# Patient Record
Sex: Female | Born: 1975
Health system: Southern US, Community
[De-identification: ages and names within clinical notes are randomized; demographics above are authoritative.]

## PROBLEM LIST (undated history)

## (undated) DIAGNOSIS — Z5189 Encounter for other specified aftercare: Secondary | ICD-10-CM

## (undated) DIAGNOSIS — E039 Hypothyroidism, unspecified: Secondary | ICD-10-CM

## (undated) DIAGNOSIS — E669 Obesity, unspecified: Secondary | ICD-10-CM

## (undated) DIAGNOSIS — M1990 Unspecified osteoarthritis, unspecified site: Secondary | ICD-10-CM

## (undated) DIAGNOSIS — I639 Cerebral infarction, unspecified: Secondary | ICD-10-CM

## (undated) DIAGNOSIS — G709 Myoneural disorder, unspecified: Secondary | ICD-10-CM

## (undated) DIAGNOSIS — E559 Vitamin D deficiency, unspecified: Secondary | ICD-10-CM

## (undated) DIAGNOSIS — K219 Gastro-esophageal reflux disease without esophagitis: Secondary | ICD-10-CM

## (undated) DIAGNOSIS — T7840XA Allergy, unspecified, initial encounter: Secondary | ICD-10-CM

## (undated) DIAGNOSIS — F419 Anxiety disorder, unspecified: Secondary | ICD-10-CM

## (undated) DIAGNOSIS — D649 Anemia, unspecified: Secondary | ICD-10-CM

## (undated) HISTORY — DX: Hypothyroidism, unspecified: E03.9

## (undated) HISTORY — DX: Unspecified osteoarthritis, unspecified site: M19.90

## (undated) HISTORY — DX: Vitamin D deficiency, unspecified: E55.9

## (undated) HISTORY — DX: Anxiety disorder, unspecified: F41.9

## (undated) HISTORY — DX: Cerebral infarction, unspecified: I63.9

## (undated) HISTORY — PX: DILATION AND CURETTAGE OF UTERUS: SHX78

## (undated) HISTORY — DX: Anemia, unspecified: D64.9

## (undated) HISTORY — DX: Allergy, unspecified, initial encounter: T78.40XA

## (undated) HISTORY — DX: Encounter for other specified aftercare: Z51.89

## (undated) HISTORY — DX: Gastro-esophageal reflux disease without esophagitis: K21.9

## (undated) HISTORY — PX: COSMETIC SURGERY: SHX468

---
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
## 2000-12-27 ENCOUNTER — Inpatient Hospital Stay (HOSPITAL_COMMUNITY): Admission: EM | Admit: 2000-12-27 | Discharge: 2000-12-29 | Payer: Self-pay | Admitting: *Deleted

## 2003-01-17 ENCOUNTER — Emergency Department (HOSPITAL_COMMUNITY): Admission: EM | Admit: 2003-01-17 | Discharge: 2003-01-17 | Payer: Self-pay | Admitting: Emergency Medicine

## 2003-01-17 ENCOUNTER — Encounter: Payer: Self-pay | Admitting: Emergency Medicine

## 2006-09-04 ENCOUNTER — Encounter (INDEPENDENT_AMBULATORY_CARE_PROVIDER_SITE_OTHER): Payer: Self-pay | Admitting: Specialist

## 2006-09-04 ENCOUNTER — Ambulatory Visit (HOSPITAL_COMMUNITY): Admission: RE | Admit: 2006-09-04 | Discharge: 2006-09-04 | Payer: Self-pay | Admitting: Obstetrics and Gynecology

## 2007-05-18 ENCOUNTER — Emergency Department (HOSPITAL_COMMUNITY): Admission: EM | Admit: 2007-05-18 | Discharge: 2007-05-18 | Payer: Self-pay | Admitting: Emergency Medicine

## 2007-09-29 ENCOUNTER — Ambulatory Visit: Payer: Self-pay | Admitting: Orthopedic Surgery

## 2007-09-29 DIAGNOSIS — S93409A Sprain of unspecified ligament of unspecified ankle, initial encounter: Secondary | ICD-10-CM | POA: Insufficient documentation

## 2007-09-29 DIAGNOSIS — M79609 Pain in unspecified limb: Secondary | ICD-10-CM | POA: Insufficient documentation

## 2007-09-29 DIAGNOSIS — M25579 Pain in unspecified ankle and joints of unspecified foot: Secondary | ICD-10-CM | POA: Insufficient documentation

## 2007-10-06 ENCOUNTER — Ambulatory Visit: Payer: Self-pay | Admitting: Orthopedic Surgery

## 2007-10-20 ENCOUNTER — Ambulatory Visit: Payer: Self-pay | Admitting: Orthopedic Surgery

## 2007-10-20 ENCOUNTER — Encounter (INDEPENDENT_AMBULATORY_CARE_PROVIDER_SITE_OTHER): Payer: Self-pay | Admitting: *Deleted

## 2007-12-06 ENCOUNTER — Ambulatory Visit: Payer: Self-pay | Admitting: Orthopedic Surgery

## 2010-12-31 NOTE — Consult Note (Signed)
NAME:  Donna Mccarthy NO.:  [id number]   MEDICAL RECORD NO.:  [id number]          PATIENT TYPE:  EMS   LOCATION:  MAJO                         FACILITY:  [facility]   PHYSICIAN:  Marlan Palau, M.D.  DATE OF BIRTH:  1976/02/22   DATE OF CONSULTATION:  05/18/2007  DATE OF DISCHARGE:                                 CONSULTATION   HISTORY OF PRESENT ILLNESS:  Donna Mccarthy is a 35 year old right-  handed black female born 1976-02-27, with a history of depression  and suicide attempt in May 2002 with an overdose of Tylenol.  The  patient does have a history migraine headaches and claims to have events  of right leg numbness with her headaches.  The patient had similar onset  on the evening prior to this evaluation with right leg tingling.  The  patient was sent from [facility] to the [facility] emergency room for  evaluation of onset of right leg weakness and speech alteration, drawing  of the face.  The patient came into the a emergency room as a code  stroke.  Onset of deficits were around 12:45 p.m.  Clinical examination  suggests a psychogenic event.  The patient was set up for a CT scan of  the brain which by my review appears to be unremarkable.   PAST MEDICAL HISTORY:  Significant for:  1. New onset of right leg weakness and speech disturbance, likely      psychogenic in nature.  2. History of suicide attempt in 2002.  3. Obesity.  4. Migraine headaches.  5. D&C in the past.  6. History of anemia.   The patient is on no medications, has no known allergies with exception  that she has had a reaction to IV IRON DEXTRAN.   The patient does not smoke or drink.   SOCIAL HISTORY:  The patient is single, lives in the [place], [place],  area.  The patient has one son who is alive and well.  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   FAMILY MEDICAL HISTORY:  Is notable that mother is alive.  Father is  alive, has history of  hypertension.  The patient's brother is alive and  well.  There is a positive family history for diabetes, cancer, heart  disease and thyroid disease.   REVIEW OF SYSTEMS:  Notable for no recent fevers, chills.  The patient  does have history of headaches about once a month.  The patient did have  a headache today, although it was mild. The patient denies any vision  changes, swallowing problems. Noted to have photophobia with the  headaches.  Denies shortness of breath, chest pain, palpitations. Does  note some heart racing at times, however.  Does have some nausea.  No  abdominal pains, no trouble controlling bowels or bladder.  No blackout  episodes or seizures in the past.   PHYSICAL EXAMINATION:  VITAL SIGNS:  Blood pressure is 133/66, heart  rate 70, respiratory rate 22, temperature afebrile.  GENERAL:  The patient is a moderately obese black female who  is alert  and cooperative at the time of examination.  HEENT:  Head is atraumatic.  Eyes:  Pupils equal, round, reactive to  light.  Disks are flat bilaterally.  NECK:  Supple.  No carotid bruits noted.  RESPIRATORY:  Examination is clear.  CARDIOVASCULAR:  Examination reveals a regular rate and rhythm.  No  obvious murmurs, rubs noted.  EXTREMITIES:  Are without significant edema.  ABDOMEN:  Reveals positive bowel sounds.  No organomegaly or tenderness  noted.  NEUROLOGIC:  Cranial nerves as above.  The patient tended to pull the  left mouth down when trying to smile but otherwise has good symmetry  with eye closure and bringing eyebrows up.  The patient has good  pinprick sensation on face bilaterally.  The patient has a stuttering  type speech pattern.  No true aphasia noted.  No true dysarthria is  noted.  The patient has again full extraocular movements and full visual  fields.  Motor testing reveals full strength in all fours with exception  of poor motor effort with the right leg.  The patient has an  inconsistent  examination. Cannot initially keep the leg up off the bed  against gravity but then can perform heel-to-shin to some degree with  the right leg, clearly keeping it up against gravity.  The patient has  good abduction and adduction in a symmetric fashion in both legs.  The  patient has good dorsiflexion of the feet on both sides.  The patient  has good pinprick sensation throughout.  Vibratory sensation is  depressed on the right arm and right leg as compared to the left, and  again pinprick sensation is depressed on the right leg only.  The  patient has no drift with the upper extremities.  No drift with the left  lower extremity.  The patient was not ambulated.  Deep tendon reflexes  symmetric, normal,  and toes are downgoing bilaterally.   LABORATORY VALUES:  Pending at this time.   IMPRESSION:  1. 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  2. History of migraine headache.   This patient has no objective deficits on clinical examination. Variable  clinical examination most consistent with a psychogenic deficit.  Patient was not a t-PA candidate because of this.  Will perform an MRI  scan of the brain in the emergency room.  If  this unremarkable, will  discharge the patient home.      Marlan Palau, M.D.  Electronically Signed     CKW/MEDQ  D:  05/18/2007  T:  05/18/2007  Job:  [id number]   cc:   [facility]
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
## 2011-01-03 NOTE — Discharge Summary (Signed)
[facility]  Patient:    Donna Mccarthy, Donna Mccarthy                  MRN: [id number] Adm. Date:  [id number] Disc. Date: [id number] Attending:  Denny Peon                           Discharge Summary  IDENTIFYING INFORMATION:  Donna Mccarthy is a 35 year old African-American female admitted for serious overdose on Tylenol in suicidal attempt.  HISTORY OF PRESENT ILLNESS:  The patient was initially observed at [facility] and sent to [place] for treatment of depression.  Details are available on the patients initial evaluation.  HOSPITAL COURSE:  After admitting into the ward, the patient was placed on special observation.  She did not have any ongoing medical problems; however, by reviewing the records, I found that at [facility] she had Tylenol ingestion on January 19.  After calling poison control, I learned that this case was not reported to poison control and since ingestion took place at 5 p.m. on a Saturday, it is likely that four hours peak level was about 150 mcg/ml which should result in treatment with Mucomyst as per recommendation of poison control, she was started on loading dose of Mucomyst in dose of 140 mcg/kg which represented 12.5 g p.o. initially on Dec 28, 2000 at 9 in the morning and later 6.5 g daily q.4h. (six doses).  The liver function tests were checked and both liver function tests and INR were normal.  The patients vital signs during hospitalization were stable and no further treatment for overdose was needed.  During hospital stay, the patient has improved.  She was glad that she survived, and she wanted to enter therapy, but refused to take medication.  I talked on the phone with her father who supported the idea of discharging her home, feeling that he will take good care of her in the home environment and with therapy as she will recover better.  At the time of discharge, the patient denied any  dangerous ideation or psychosis.  Blood work was mostly done at [facility] and was essentially normal with exception of increased level of Tylenol initially. Prior to transfer to our unit from [facility], Tylenol level was below detection.  As mentioned before, prior to discharge INR was 1.3, liver function tests normal, and PT normal.  The patient upon discharge denied suicidal ideation, was able to promise safety, and was eager for referral for outpatient therapy.  She also promised to reconsider medication should depression get any worse.  DISCHARGE DIAGNOSES: Axis I:    1. Adjustment disorder with mixed emotions.            2. Rule out major depression. Axis II:   No diagnosis. Axis III:  Status post serious overdose on Tylenol. Axis IV:   Moderate to serious stressors, family circumstances. Axis V:    Global Assessment of Functioning at present at the time of            discharge is 60; initial 35; maximum for past year was 75.  DISCHARGE RECOMMENDATIONS:  The patient can return to work on Jan 04, 2001. She is to call if any problems or recurrence of symptoms.  DISCHARGE FOLLOWUP:  She is to follow with family doctor for urinary tract problems.  She complained of some urinary urgency, but urinalysis was normal upon discharged, and culture was  not available.  Blood count was normal.  The patient has appointment with [facility].  DISPOSITION/CONDITION AT DISCHARGE:  The patient agrees to recommendations and was discharged in good condition in the care of her father. DD:  12/30/00 TD:  12/31/00 Job: [id number]

## 2011-01-03 NOTE — Op Note (Signed)
Donna Mccarthy, Donna Mccarthy NO.:  [id number]   MEDICAL RECORD NO.:  [id number]          PATIENT TYPE:  AMB   LOCATION:  SDC                           FACILITY:  [facility]   PHYSICIAN:  Zelphia Cairo, MD    DATE OF BIRTH:  10-04-1975   DATE OF PROCEDURE:  09/04/2006  DATE OF DISCHARGE:                               OPERATIVE REPORT   PREOPERATIVE DIAGNOSES:  1. Menorrhagia.  2. Anemia.   POSTOPERATIVE DIAGNOSES:  1. Menorrhagia.  2. Anemia.   PROCEDURE:  Hysteroscopy, dilatation and curettage.   SURGEON:  Zelphia Cairo, MD   ASSISTANT:  None.   ANESTHESIA:  General.   SPECIMEN:  Endometrial curettage.   ESTIMATED BLOOD LOSS:  Minimal.   COMPLICATIONS:  None.   CONDITION:  Stable and extubated to recovery room.   PROCEDURE:  The patient was taken to the operating room, where general  anesthesia was easily obtained.  She was placed in a dorsal lithotomy  position using Allen stirrups.  She was prepped and draped in sterile  fashion and a red rubber catheter was used to drain her bladder for  clear urine.   A bivalve speculum was then placed in the vagina, a single-tooth  tenaculum placed on the anterior lip of the cervix and a uterine sound  was used to sound the uterus to 7 cm.  The cervix was then dilated with  [facility] dilators.  The diagnostic hysteroscope was used to visualize the  endometrial cavity.  There was a fluffy endometrial cavity.  It was  somewhat irregular.  Bilateral ostia appeared normal.  No polyps or  submucous fibroids were noted.  The hysteroscope was then removed and a  gentle curetting was performed.  Specimens placed on Telfa and passed  off  and sent to pathology.  Lidocaine 1% was then used to provide local  anesthesia.  The tenaculum and speculum were then removed from the  vagina.  Sponge, lap, needle and instrument counts were correct x2.  She  tolerated the procedure well and was sent to the recovery room in stable   condition      Zelphia Cairo, MD  Electronically Signed     GA/MEDQ  D:  09/04/2006  T:  09/04/2006  Job:  [id number]

## 2016-08-05 ENCOUNTER — Other Ambulatory Visit: Payer: Self-pay | Admitting: Internal Medicine

## 2016-08-05 MED ORDER — PROMETHAZINE HCL 25 MG PO TABS: 25.0000 mg | ORAL_TABLET | Freq: Three times a day (TID) | ORAL | 0 refills | Status: DC | PRN

## 2016-09-11 ENCOUNTER — Other Ambulatory Visit: Payer: Self-pay | Admitting: Medical

## 2016-09-11 LAB — TSH: TSH: 21.15 mIU/L — ABNORMAL HIGH

## 2016-09-16 DIAGNOSIS — E049 Nontoxic goiter, unspecified: Secondary | ICD-10-CM | POA: Diagnosis not present

## 2016-09-16 DIAGNOSIS — E039 Hypothyroidism, unspecified: Secondary | ICD-10-CM | POA: Diagnosis not present

## 2016-09-17 ENCOUNTER — Encounter: Payer: Self-pay | Admitting: Family Medicine

## 2016-09-17 ENCOUNTER — Ambulatory Visit (INDEPENDENT_AMBULATORY_CARE_PROVIDER_SITE_OTHER): Payer: 59 | Admitting: Family Medicine

## 2016-09-17 VITALS — BP 112/70 | HR 63 | Temp 99.2°F

## 2016-09-17 DIAGNOSIS — R05 Cough: Secondary | ICD-10-CM

## 2016-09-17 DIAGNOSIS — R059 Cough, unspecified: Secondary | ICD-10-CM

## 2016-09-17 DIAGNOSIS — J069 Acute upper respiratory infection, unspecified: Secondary | ICD-10-CM | POA: Diagnosis not present

## 2016-09-17 LAB — POC INFLUENZA A&B (BINAX/QUICKVUE)
INFLUENZA A, POC: NEGATIVE
Influenza B, POC: NEGATIVE

## 2016-09-17 MED ORDER — BENZONATATE 200 MG PO CAPS: 200.0000 mg | ORAL_CAPSULE | Freq: Two times a day (BID) | ORAL | 0 refills | Status: DC | PRN

## 2016-09-17 MED ORDER — ALBUTEROL SULFATE 108 (90 BASE) MCG/ACT IN AEPB: 2.0000 | INHALATION_SPRAY | RESPIRATORY_TRACT | 0 refills | Status: DC | PRN

## 2016-09-17 NOTE — Progress Notes (Signed)
Subjective: Chief Complaint  Patient presents with  . cough    coughing started yesterday, flu test was negative, no fever but chills, chest congestion, ear pain     Donna Mccarthy is a 41 y.o. female who presents for a 24 hour history of coughing, sore throat, right ear pain, low grade fever, chills, and body aches.  She reports having an acute URI in early January but symptoms went away completely after a Z-pack.   Denies rhinorrhea, sinus pressure, abdominal pain, N/V/D.  Does not smoke.   Treatment to date: Nyquil, Dayquil.  Positive sick contacts.  No other aggravating or relieving factors.  No other c/o.  ROS as in subjective.   Objective: Vitals:   09/17/16 0909  BP: 112/70  Pulse: 63  Temp: 99.2 F (37.3 C)    General appearance: Alert, WD/WN, no distress, mildly ill appearing                             Skin: warm, no rash                           Head: no sinus tenderness                            Eyes: conjunctiva normal, corneas clear, PERRLA                            Ears: pearly TMs, external ear canals normal                          Nose: septum midline, turbinates swollen, with erythema and clear discharge             Mouth/throat: MMM, tongue normal, mild pharyngeal erythema                           Neck: supple, no adenopathy, no thyromegaly, nontender                          Heart: RRR, normal S1, S2, no murmurs                         Lungs: mild expiratory wheezes, no rales, or rhonchi      Assessment: Acute URI - Plan: POC Influenza A&B(BINAX/QUICKVUE)  Cough - Plan: POC Influenza A&B(BINAX/QUICKVUE), benzonatate (TESSALON) 200 MG capsule, Albuterol Sulfate (PROAIR RESPICLICK) 108 (90 Base) MCG/ACT AEPB   Plan: Flu swab negative. She appears to have a viral illness. Albuterol inhaler sample provided. First dose in the office. Tessalon prescribed.  Discussed diagnosis and treatment of URI.  Suggested symptomatic OTC remedies. Nasal saline  spray for congestion.  Tylenol or Ibuprofen OTC for fever and malaise.  Call/return in 2-3 days if symptoms aren't resolving.

## 2016-09-19 ENCOUNTER — Other Ambulatory Visit: Payer: Self-pay | Admitting: Family Medicine

## 2016-09-19 DIAGNOSIS — R059 Cough, unspecified: Secondary | ICD-10-CM

## 2016-09-19 DIAGNOSIS — R05 Cough: Secondary | ICD-10-CM

## 2016-09-19 MED ORDER — METHYLPREDNISOLONE SODIUM SUCC 125 MG IJ SOLR
125.0000 mg | Freq: Once | INTRAMUSCULAR | Status: AC
  Administered 2016-09-19: 125 mg via INTRAMUSCULAR

## 2016-10-10 ENCOUNTER — Other Ambulatory Visit: Payer: Self-pay | Admitting: Medical

## 2016-10-10 MED ORDER — NITROFURANTOIN MONOHYD MACRO 100 MG PO CAPS: 100.0000 mg | ORAL_CAPSULE | Freq: Two times a day (BID) | ORAL | 0 refills | Status: DC

## 2016-11-12 ENCOUNTER — Other Ambulatory Visit: Payer: Self-pay | Admitting: Medical

## 2016-11-12 DIAGNOSIS — E039 Hypothyroidism, unspecified: Secondary | ICD-10-CM | POA: Diagnosis not present

## 2016-11-13 LAB — TSH: TSH: 0.03 mIU/L — ABNORMAL LOW

## 2016-11-18 ENCOUNTER — Ambulatory Visit (INDEPENDENT_AMBULATORY_CARE_PROVIDER_SITE_OTHER): Payer: 59 | Admitting: Family Medicine

## 2016-11-18 ENCOUNTER — Encounter: Payer: Self-pay | Admitting: Family Medicine

## 2016-11-18 VITALS — BP 110/70 | HR 72 | Temp 98.2°F

## 2016-11-18 DIAGNOSIS — L03119 Cellulitis of unspecified part of limb: Secondary | ICD-10-CM

## 2016-11-18 DIAGNOSIS — L02419 Cutaneous abscess of limb, unspecified: Secondary | ICD-10-CM

## 2016-11-18 MED ORDER — DOXYCYCLINE HYCLATE 100 MG PO TABS
100.0000 mg | ORAL_TABLET | Freq: Two times a day (BID) | ORAL | 0 refills | Status: DC
Start: 2016-11-18 — End: 2016-12-17

## 2016-11-18 NOTE — Patient Instructions (Signed)
Use warm compresses often.

## 2016-11-18 NOTE — Progress Notes (Signed)
   Subjective:    Patient ID: Donna Mccarthy, female    DOB: 1976-03-15, 41 y.o.   MRN: [id number]  HPI Chief Complaint  Patient presents with  . boil    started noticing this saturday. its a knot and very red around the area. has a scab on top of knot but not busted or anything. been using warm compresses on it   She is here with complaints of a 4 day history of redness and tenderness her right upper thigh. States initially she noticed a pimple and it has gotten larger and more painful over the past 2 days. States she has been using warm compresses.   Denies history of MRSA or diabetes. She is immunocompetent.   Denies fever, chills, nausea, vomiting or diarrhea. Denies rash or painful or swollen joints.   She has an IUD for contraception.   Reviewed allergies, medications, past medical, surgical, and social history.    Review of Systems Pertinent positives and negatives in the history of present illness.     Objective:   Physical Exam BP 110/70   Pulse 72   Temp 98.2 F (36.8 C) (Oral)   SpO2 98%   4 cm x 5 cm area of firmness to anterior RUL with a dark pinpoint center and mild surrounding erythema. No fluctuance. No drainage.       Assessment & Plan:  Cellulitis and abscess of leg - Plan: doxycycline (VIBRA-TABS) 100 MG tablet  Discussed that she does not have a systemic infection and we will treat her for abscess and cellulitis.  Start taking Doxycycline. She has IUD for birth control. Use warm compresses. May take tylenol or Ibuprofen if needed for discomfort.  Marked the area of erythema. If the redness goes outside the marked area then she will call or return. Discussed that we can recheck this in a day or two and make sure it is healing and not worsening. She is aware that she should report back any systemic symptoms such as fever, chills.

## 2016-11-21 ENCOUNTER — Ambulatory Visit: Payer: 59 | Admitting: Family Medicine

## 2016-12-17 ENCOUNTER — Ambulatory Visit (HOSPITAL_COMMUNITY)
Admission: RE | Admit: 2016-12-17 | Discharge: 2016-12-17 | Disposition: A | Discharge status: Home / Self Care | Payer: 59 | Source: Ambulatory Visit | Attending: Family Medicine | Admitting: Family Medicine

## 2016-12-17 ENCOUNTER — Ambulatory Visit (INDEPENDENT_AMBULATORY_CARE_PROVIDER_SITE_OTHER): Payer: 59 | Admitting: Family Medicine

## 2016-12-17 ENCOUNTER — Encounter: Payer: Self-pay | Admitting: Family Medicine

## 2016-12-17 VITALS — BP 112/80 | HR 77

## 2016-12-17 DIAGNOSIS — R202 Paresthesia of skin: Secondary | ICD-10-CM

## 2016-12-17 DIAGNOSIS — R200 Anesthesia of skin: Secondary | ICD-10-CM | POA: Insufficient documentation

## 2016-12-17 DIAGNOSIS — M79662 Pain in left lower leg: Secondary | ICD-10-CM | POA: Diagnosis not present

## 2016-12-17 LAB — D-DIMER, QUANTITATIVE: D-Dimer, Quant: 0.79 mcg/mL FEU — ABNORMAL HIGH (ref <0.50)

## 2016-12-17 NOTE — Progress Notes (Signed)
   Subjective:    Patient ID: Donna Mccarthy, female    DOB: 1976-01-15, 41 y.o.   MRN: [id number]  HPI Chief Complaint  Patient presents with  . left leg pain    feels like its asleep   She is here with complaints of a 3 day history of numbness, tingling and a "heaviness" to her left upper leg and posterior knee. states symptoms started a few hours after taking a long brisk walk. Symptoms started in the middle of the night. Denies injury or history of the same. Denies pain. Nothing aggravates or alleviates her symptoms.   She has used heat and ice and wore a compression stocking yesterday without any relief.   Denies fever, chills, chest pain, palpitations, cough, abdominal pain, N/V/D.   Denies personal or family history of blood clotting disorder. No recent surgery or immobilization.  Mirena IUD for contraception.   Reviewed allergies, medications, past medical, surgical, family, and social history.    Review of Systems Pertinent positives and negatives in the history of present illness.     Objective:   Physical Exam  Constitutional: She is oriented to person, place, and time. She appears well-developed and well-nourished. No distress.  Cardiovascular: Normal rate, regular rhythm, normal heart sounds and intact distal pulses.  Exam reveals no gallop and no friction rub.   No murmur heard. Pulmonary/Chest: Effort normal and breath sounds normal.  Musculoskeletal:       Left hip: Normal.       Left knee: Normal.       Right lower leg: She exhibits tenderness and swelling.  Profound tenderness to left calf without erythema or warmth. No palpable cord. Mild swelling to LLE compared to RLE, no pitting edema.  Normal cap refill, sensation, pulses, ROM and strength.  Negative Homan's.   Neurological: She is alert and oriented to person, place, and time. She has normal strength and normal reflexes. No cranial nerve deficit or sensory deficit. Coordination and gait normal.    Skin: Skin is warm and dry. No rash noted. No erythema. No pallor.  Psychiatric: She has a normal mood and affect. Her behavior is normal. Thought content normal.   BP 112/80   Pulse 77   SpO2 98%        Assessment & Plan:  Numbness and tingling of left leg - Plan: VAS [place] LOWER EXTREMITY VENOUS (DVT), CANCELED: US Venous Img Lower Unilateral Left  Tenderness of left calf - Plan: VAS [place] LOWER EXTREMITY VENOUS (DVT), D-dimer, quantitative (not at [facility]), CANCELED: US Venous Img Lower Unilateral Left  Discussed that her symptoms may be related to strain from strenuous activity prior to onset vs DVT. LLE is neurovascularly intact. Plan to order D-dimer stat and if elevated will send her for a [place] to rule out DVT. If negative, we will have her try heat and anti-inflammatory and see if this helps.

## 2016-12-17 NOTE — Progress Notes (Signed)
*  PRELIMINARY RESULTS* Vascular Ultrasound Left lower extremity venous duplex has been completed.  Preliminary findings: No evidence of DVT in visualized veins or baker's cyst.   Called results to [facility].    Farrel Demark, RDMS, RVT  12/17/2016, 4:36 PM

## 2017-01-09 ENCOUNTER — Other Ambulatory Visit: Payer: Self-pay | Admitting: Family Medicine

## 2017-01-09 MED ORDER — SULFAMETHOXAZOLE-TRIMETHOPRIM 800-160 MG PO TABS: 1.0000 | ORAL_TABLET | Freq: Two times a day (BID) | ORAL | 0 refills | Status: DC

## 2017-01-09 NOTE — Progress Notes (Signed)
   Subjective:    Patient ID: Donna Mccarthy, female    DOB: 01/26/1976, 41 y.o.   MRN: [id number]  HPI Complains of urinary frequency, dysuria and pelvic pressure. Some mild nausea. Denies fever, chills, vomiting or diarrhea.   Last UTI was 9 months ago. No history of recurrent UTIs or pyelonephritis.   IUD for contraception.   Review of Systems     Objective:   Physical Exam        Assessment & Plan:

## 2017-03-18 ENCOUNTER — Other Ambulatory Visit: Payer: Self-pay | Admitting: Medical

## 2017-03-18 MED ORDER — DOXYCYCLINE HYCLATE 100 MG PO TABS: 100.0000 mg | ORAL_TABLET | Freq: Two times a day (BID) | ORAL | 0 refills | Status: DC

## 2017-03-18 MED ORDER — MUPIROCIN 2 % EX OINT: 1.0000 "application " | TOPICAL_OINTMENT | Freq: Three times a day (TID) | CUTANEOUS | 0 refills | Status: DC

## 2017-03-18 MED ORDER — CHLORHEXIDINE GLUCONATE 4 % EX LIQD: CUTANEOUS | 1 refills | Status: DC

## 2017-04-09 DIAGNOSIS — Z01419 Encounter for gynecological examination (general) (routine) without abnormal findings: Secondary | ICD-10-CM | POA: Diagnosis not present

## 2017-04-09 DIAGNOSIS — Z6841 Body Mass Index (BMI) 40.0 and over, adult: Secondary | ICD-10-CM | POA: Diagnosis not present

## 2017-04-13 ENCOUNTER — Encounter: Payer: Self-pay | Admitting: Family Medicine

## 2017-04-13 ENCOUNTER — Ambulatory Visit (INDEPENDENT_AMBULATORY_CARE_PROVIDER_SITE_OTHER): Payer: 59 | Admitting: Family Medicine

## 2017-04-13 ENCOUNTER — Ambulatory Visit
Admission: RE | Admit: 2017-04-13 | Discharge: 2017-04-13 | Disposition: A | Discharge status: Home / Self Care | Payer: 59 | Source: Ambulatory Visit | Attending: Family Medicine | Admitting: Family Medicine

## 2017-04-13 VITALS — BP 112/80 | HR 76

## 2017-04-13 DIAGNOSIS — M25562 Pain in left knee: Secondary | ICD-10-CM | POA: Diagnosis not present

## 2017-04-13 DIAGNOSIS — M2392 Unspecified internal derangement of left knee: Secondary | ICD-10-CM | POA: Diagnosis not present

## 2017-04-13 DIAGNOSIS — M1710 Unilateral primary osteoarthritis, unspecified knee: Secondary | ICD-10-CM | POA: Diagnosis not present

## 2017-04-13 NOTE — Progress Notes (Signed)
   Subjective:    Patient ID: Donna Mccarthy, female    DOB: [birth year], 41 y.o.   MRN: [id number]  HPI Chief Complaint  Patient presents with  . knee pain    left knee pain x 2 weeks. trouble putting weight on, walking, climbing stairs.Marland Kitchen tried ice,heat,soma patches, ibuprofen and no relief   She is here with a 2 week history of left knee pain and swelling. Denies injury.  States pain feels like a dull ache constantly and worse with weight bearing. States going up steps is extremely painful.  Pain is worse medially but also posterior, anterior and laterally.   States her knee is occasionally locking up. No redness. Denies giving away.   Has been using Ibuprofen or Aleve, salon pas, ice and heat.   Reviewed allergies, medications, past medical, surgical, and social history.    Review of Systems Pertinent positives and negatives in the history of present illness.     Objective:   Physical Exam  Constitutional: She appears well-developed and well-nourished. No distress.  Musculoskeletal:       Left knee: She exhibits decreased range of motion and effusion. Tenderness found. Medial joint line tenderness noted.  Could not do a full exam due to tenderness and pain with movement.    BP 112/80   Pulse 76      Assessment & Plan:  Acute pain of left knee - Plan: DG Knee Complete 4 Views Left, Ambulatory referral to Orthopedic Surgery  Locking knee, left - Plan: Ambulatory referral to Orthopedic Surgery  Difficult to fully assess due to profound pain and tenderness on exam. Limited ROM due to pain.  Plan to send her for an XR and refer to orthopedic surgery for further evaluation.  She will continue with ice or heat and NSAIDs for now.

## 2017-04-28 ENCOUNTER — Encounter (INDEPENDENT_AMBULATORY_CARE_PROVIDER_SITE_OTHER): Payer: Self-pay | Admitting: Orthopaedic Surgery

## 2017-04-28 ENCOUNTER — Ambulatory Visit (INDEPENDENT_AMBULATORY_CARE_PROVIDER_SITE_OTHER): Payer: 59 | Admitting: Orthopaedic Surgery

## 2017-04-28 DIAGNOSIS — M1712 Unilateral primary osteoarthritis, left knee: Secondary | ICD-10-CM

## 2017-04-28 MED ORDER — MELOXICAM 7.5 MG PO TABS: 15.0000 mg | ORAL_TABLET | Freq: Every day | ORAL | 2 refills | Status: DC | PRN

## 2017-04-28 MED ORDER — LIDOCAINE HCL 1 % IJ SOLN
2.0000 mL | INTRAMUSCULAR | Status: AC | PRN
  Administered 2017-04-28: 2 mL

## 2017-04-28 MED ORDER — BUPIVACAINE HCL 0.5 % IJ SOLN
2.0000 mL | INTRAMUSCULAR | Status: AC | PRN
  Administered 2017-04-28: 2 mL via INTRA_ARTICULAR

## 2017-04-28 MED ORDER — METHYLPREDNISOLONE ACETATE 40 MG/ML IJ SUSP
40.0000 mg | INTRAMUSCULAR | Status: AC | PRN
  Administered 2017-04-28: 40 mg via INTRA_ARTICULAR

## 2017-04-28 NOTE — Addendum Note (Signed)
Addended by: Albertina ParrGARCIA, Ringo Sherod on: 04/28/2017 04:52 PM   Modules accepted: Orders

## 2017-04-28 NOTE — Addendum Note (Signed)
Addended by: Albertina ParrGARCIA, Adeline Petitfrere on: 04/28/2017 04:11 PM   Modules accepted: Orders

## 2017-04-28 NOTE — Addendum Note (Signed)
Addended by: Albertina ParrGARCIA, Drewey Begue on: 04/28/2017 04:14 PM   Modules accepted: Orders

## 2017-04-28 NOTE — Progress Notes (Addendum)
Office Visit Note   Patient: Donna Mccarthy           Date of Birth: 10/30/1975           MRN: [id number] Visit Date: 04/28/2017              Requested by: Ignatius SpeckingVyas, Dhruv B, MD [address] [place], [place] [id number] PCP: Ignatius SpeckingVyas, Dhruv B, MD   Assessment & Plan: Visit Diagnoses:  1. Unilateral primary osteoarthritis, left knee     Plan: Overall impression is osteoarthritis flareup.  She has arthritis at slightly advanced for her age. 35 mL of joint fluid was aspirated from the knee and cortisone was injected. Home exercise program was provided. Recommend weight loss. Prescription for meloxicam. Follow-up as needed. We'll call the patient with the lab results  Follow-Up Instructions: Return if symptoms worsen or fail to improve.   Orders:  No orders of the defined types were placed in this encounter.  Meds ordered this encounter  Medications  . meloxicam (MOBIC) 7.5 MG tablet    Sig: Take 2 tablets (15 mg total) by mouth daily as needed for pain.    Dispense:  30 tablet    Refill:  2      Procedures: Large Joint Inj Date/Time: 04/28/2017 4:06 PM Performed by: Tarry KosXU, NAIPING M Authorized by: Tarry KosXU, NAIPING M   Consent Given by:  Patient Timeout: prior to procedure the correct patient, procedure, and site was verified   Indications:  Pain Location:  Knee Site:  L knee Prep: patient was prepped and draped in usual sterile fashion   Needle Size:  22 G Ultrasound Guidance: No   Fluoroscopic Guidance: No   Arthrogram: No   Medications:  2 mL lidocaine 1 %; 2 mL bupivacaine 0.5 %; 40 mg methylPREDNISolone acetate 40 MG/ML Aspirate amount (mL):  35 Patient tolerance:  Patient tolerated the procedure well with no immediate complications     Clinical Data: No additional findings.   Subjective: Chief Complaint  Patient presents with  . Left Knee - Pain    Patient 41 year old female comes in with a one-month history of left knee pain and swelling of insidious onset. She endorses  giving way and chronic pain on the medial aspect. She denies any history of injuries. Denies any numbness and tingling. Pain is worse with activity.    Review of Systems  Constitutional: Negative.   HENT: Negative.   Eyes: Negative.   Respiratory: Negative.   Cardiovascular: Negative.   Endocrine: Negative.   Musculoskeletal: Negative.   Neurological: Negative.   Hematological: Negative.   Psychiatric/Behavioral: Negative.   All other systems reviewed and are negative.    Objective: Vital Signs: There were no vitals taken for this visit.  Physical Exam  Constitutional: She is oriented to person, place, and time. She appears well-developed and well-nourished.  HENT:  Head: Normocephalic and atraumatic.  Eyes: EOM are normal.  Neck: Neck supple.  Pulmonary/Chest: Effort normal.  Abdominal: Soft.  Neurological: She is alert and oriented to person, place, and time.  Skin: Skin is warm. Capillary refill takes less than 2 seconds.  Psychiatric: She has a normal mood and affect. Her behavior is normal. Judgment and thought content normal.  Nursing note and vitals reviewed.   Ortho Exam Left knee exam shows a moderate effusion. Body habitus is slightly increase which limits the exam. Collaterals and cruciates are grossly intact. Range of motion is mildly limited secondary to the effusion. Specialty Comments:  No specialty  comments available.  Imaging: No results found.   PMFS History: Patient Active Problem List   Diagnosis Date Noted  . ANKLE PAIN 09/29/2007  . FOOT PAIN, LEFT 09/29/2007  . ANKLE SPRAIN 09/29/2007   Past Medical History:  Diagnosis Date  . Hypothyroidism     No family history on file.  No past surgical history on file. Social History   Occupational History  . Not on file.   Social History Main Topics  . Smoking status: Never Smoker  . Smokeless tobacco: Never Used  . Alcohol use No  . Drug use: No  . Sexual activity: Not on file

## 2017-04-29 LAB — CELL COUNT AND DIFF, FLUID, OTHER
Basophils, %: 0 %
Eosinophils, %: 0 %
LYMPHOCYTES, %: 5 %
Mesothelial, %: 5 %
Monocyte/Macrophage %: 49 %
Neutrophils, %: 11 %
Total Nucleated Cell Ct: 396 cells/uL

## 2017-04-29 LAB — SYNOVIAL FLUID, CRYSTAL

## 2017-04-29 NOTE — Progress Notes (Signed)
Let her know the fluid looks like it's an arthritis flare up

## 2017-05-12 DIAGNOSIS — E039 Hypothyroidism, unspecified: Secondary | ICD-10-CM | POA: Diagnosis not present

## 2017-05-14 DIAGNOSIS — E039 Hypothyroidism, unspecified: Secondary | ICD-10-CM | POA: Diagnosis not present

## 2017-05-14 DIAGNOSIS — E049 Nontoxic goiter, unspecified: Secondary | ICD-10-CM | POA: Diagnosis not present

## 2017-05-29 DIAGNOSIS — Z30433 Encounter for removal and reinsertion of intrauterine contraceptive device: Secondary | ICD-10-CM | POA: Diagnosis not present

## 2017-06-22 ENCOUNTER — Other Ambulatory Visit: Payer: Self-pay | Admitting: Medical

## 2017-06-22 MED ORDER — FLUCONAZOLE 100 MG PO TABS: ORAL_TABLET | ORAL | 0 refills | Status: DC

## 2017-06-22 MED ORDER — NITROFURANTOIN MONOHYD MACRO 100 MG PO CAPS: 100.0000 mg | ORAL_CAPSULE | Freq: Two times a day (BID) | ORAL | 0 refills | Status: AC

## 2017-06-22 MED FILL — FLUCONAZOLE 100 MG TABLET: 100 | 2 days supply | Qty: 2 | Fill #0

## 2017-06-22 MED FILL — NITROFURANTOIN MONO-MCR 100: 100 | 7 days supply | Qty: 14 | Fill #0

## 2017-06-25 DIAGNOSIS — E039 Hypothyroidism, unspecified: Secondary | ICD-10-CM | POA: Diagnosis not present

## 2017-08-06 DIAGNOSIS — Z30431 Encounter for routine checking of intrauterine contraceptive device: Secondary | ICD-10-CM | POA: Diagnosis not present

## 2017-08-06 DIAGNOSIS — R21 Rash and other nonspecific skin eruption: Secondary | ICD-10-CM | POA: Diagnosis not present

## 2017-08-19 DIAGNOSIS — J069 Acute upper respiratory infection, unspecified: Secondary | ICD-10-CM | POA: Diagnosis not present

## 2017-08-19 DIAGNOSIS — R509 Fever, unspecified: Secondary | ICD-10-CM | POA: Diagnosis not present

## 2017-08-19 DIAGNOSIS — B9789 Other viral agents as the cause of diseases classified elsewhere: Secondary | ICD-10-CM | POA: Diagnosis not present

## 2017-09-03 DIAGNOSIS — E039 Hypothyroidism, unspecified: Secondary | ICD-10-CM | POA: Diagnosis not present

## 2017-09-14 ENCOUNTER — Ambulatory Visit: Payer: 59 | Admitting: Family Medicine

## 2017-09-24 ENCOUNTER — Ambulatory Visit: Payer: 59 | Admitting: Family Medicine

## 2017-09-29 ENCOUNTER — Ambulatory Visit: Payer: 59 | Admitting: Family Medicine

## 2017-09-29 ENCOUNTER — Other Ambulatory Visit: Payer: Self-pay

## 2017-09-29 ENCOUNTER — Encounter: Payer: Self-pay | Admitting: Family Medicine

## 2017-09-29 VITALS — BP 130/82 | HR 72 | Temp 97.7°F | Resp 18 | Ht 65.0 in | Wt 272.0 lb

## 2017-09-29 DIAGNOSIS — G8929 Other chronic pain: Secondary | ICD-10-CM

## 2017-09-29 DIAGNOSIS — Z6841 Body Mass Index (BMI) 40.0 and over, adult: Secondary | ICD-10-CM

## 2017-09-29 DIAGNOSIS — K219 Gastro-esophageal reflux disease without esophagitis: Secondary | ICD-10-CM | POA: Diagnosis not present

## 2017-09-29 DIAGNOSIS — F411 Generalized anxiety disorder: Secondary | ICD-10-CM | POA: Diagnosis not present

## 2017-09-29 DIAGNOSIS — M25561 Pain in right knee: Secondary | ICD-10-CM | POA: Diagnosis not present

## 2017-09-29 DIAGNOSIS — G43909 Migraine, unspecified, not intractable, without status migrainosus: Secondary | ICD-10-CM | POA: Insufficient documentation

## 2017-09-29 DIAGNOSIS — M25562 Pain in left knee: Secondary | ICD-10-CM | POA: Insufficient documentation

## 2017-09-29 DIAGNOSIS — E039 Hypothyroidism, unspecified: Secondary | ICD-10-CM | POA: Insufficient documentation

## 2017-09-29 MED ORDER — OMEPRAZOLE 20 MG PO CPDR: 20.0000 mg | DELAYED_RELEASE_CAPSULE | Freq: Every day | ORAL | 3 refills | Status: DC

## 2017-09-29 MED FILL — OMEPRAZOLE 20 MG CAP: 20 | 30 days supply | Qty: 30 | Fill #0

## 2017-09-29 NOTE — Patient Instructions (Addendum)
Need records Dr Talmage NapBalan in endocrinology, and Dr Reece AgarG. Atkins a [facility]  Continue on  Your heart healthy diet, low fat and low carb Stay as active as you can manage  Take the omeprazole daily for 2-3 weeks After this go to every other day Let me know if this does not solve the epigastric pain  See me in 2-3 months

## 2017-09-29 NOTE — Progress Notes (Signed)
Chief Complaint  Patient presents with  . Hypothyroidism    est care   This patient is new to the office. She is under the care of Dr. Marliss Czar in the department of endocrinology for her thyroid disease.  Her thyroid hormone is adjusted and checked frequently. She is also under the care of Dr. Roda Shutters in orthopedic surgery.  He cares for her for bilateral osteoarthritis of her knees. She sees Zelphia Cairo for gynecology.  She states she is up-to-date with her testing and care.   She works as a Scientist, forensic for [employer].  Her shots are up-to-date. She is on a diet and exercise program to treat her morbid obesity.  She has cut her carbohydrates and fats, is eating more whole grains, eating more fruits and vegetables.  She is trying to exercise.  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 She takes Celexa for mild anxiety.  This works well for her. The only new complaint she would like evaluated is heartburn.  It seems to be related with her new low-fat diet.  She gets heartburn and epigastric area and sometimes it radiates back to her shoulder blade.  Not related to any specific food.  It does awaken her at night.  She does not have any acid in her throat or mouth that she notices.  No change in bowels or appetite.  Patient Active Problem List   Diagnosis Date Noted  . Hypothyroidism 09/29/2017  . Migraine 09/29/2017  . Knee pain, bilateral 09/29/2017  . Generalized anxiety disorder 09/29/2017  . Morbid obesity with BMI of 45.0-49.9, adult (HCC) 09/29/2017  . Gastroesophageal reflux disease 09/29/2017    Outpatient Encounter Medications as of 09/29/2017  Medication Sig  . citalopram (CELEXA) 10 MG tablet Take 10 mg by mouth daily.  Marland Kitchen levonorgestrel (MIRENA) 20 MCG/24HR IUD 1 each by Intrauterine route once.  . meloxicam (MOBIC) 7.5 MG tablet Take 2 tablets (15 mg total) by mouth daily as needed for  pain.  Marland Kitchen thyroid (ARMOUR) 120 MG tablet Take 120 mg by mouth daily before breakfast.  . thyroid (ARMOUR) 15 MG tablet Take 15 mg by mouth daily.  Marland Kitchen topiramate (TOPAMAX) 25 MG tablet   . omeprazole (PRILOSEC) 20 MG capsule Take 1 capsule (20 mg total) by mouth daily.   No facility-administered encounter medications on file as of 09/29/2017.     Past Medical History:  Diagnosis Date  . Allergy   . Anemia   . Anxiety   . Arthritis    arthritis knees  . Blood transfusion without reported diagnosis   . GERD (gastroesophageal reflux disease)   . Hypothyroidism     History reviewed. No pertinent surgical history.  Social History   Socioeconomic History  . Marital status: Single    Spouse name: Not on file  . Number of children: 1  . Years of education: 72  . Highest education level: Associate degree: occupational, Scientist, product/process development, or vocational program  Social Needs  . Financial resource strain: Not on file  . Food insecurity - worry: Not on file  . Food insecurity - inability: Not on file  . Transportation needs - medical: Not on file  . Transportation needs - non-medical: Not on file  Occupational History  . Occupation: cma    Employer: [employer]  Tobacco Use  . Smoking status: Never Smoker  . Smokeless tobacco: Never Used  Substance and  Sexual Activity  . Alcohol use: No  . Drug use: No  . Sexual activity: Yes    Birth control/protection: IUD  Other Topics Concern  . 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 Hypertension Mother   . Hypertension Father   . Stroke Father        TIA  . Hyperlipidemia Father     Review of Systems  Constitutional: Negative for chills, fever and weight loss.  HENT: Negative for congestion and hearing loss.   Eyes:  Negative for blurred vision and pain.  Respiratory: Negative for cough and shortness of breath.   Cardiovascular: Negative for chest pain and leg swelling.  Gastrointestinal: Positive for constipation and heartburn. Negative for abdominal pain and diarrhea.  Genitourinary: Negative for dysuria and frequency.  Musculoskeletal: Positive for joint pain. Negative for falls and myalgias.  Neurological: Negative for dizziness, seizures and headaches.  Psychiatric/Behavioral: Negative for depression. The patient is nervous/anxious. The patient does not have insomnia.     BP 130/82 (BP Location: Left Arm, Patient Position: Sitting, Cuff Size: Large)   Pulse 72   Temp 97.7 F (36.5 C) (Temporal)   Resp 18   Ht 5\' 5"  (1.651 m)   Wt 272 lb 0.6 oz (123.4 kg)   LMP 09/18/2017 (Exact Date)   SpO2 99%   BMI 45.27 kg/m   Physical Exam  Constitutional: She is oriented to person, place, and time. She appears well-developed and well-nourished.  HENT:  Head: Normocephalic and atraumatic.  Mouth/Throat: Oropharynx is clear and moist.  Eyes: Conjunctivae are normal. Pupils are equal, round, and reactive to light.  Neck: Normal range of motion. Neck supple. No thyromegaly present.  Cardiovascular: Normal rate, regular rhythm and normal heart sounds.  Pulmonary/Chest: Effort normal and breath sounds normal. No respiratory distress.  Abdominal: Soft. Bowel sounds are normal.  Deep epigastric tenderness  Musculoskeletal: Normal range of motion. She exhibits no edema.  Lymphadenopathy:    She has no cervical adenopathy.  Neurological: She is alert and oriented to person, place, and time.  Gait normal  Skin: Skin is warm and dry.  Psychiatric: She has a normal mood and affect. Her behavior is normal. Thought content normal.  Nursing note and vitals reviewed.  ASSESSMENT/PLAN: 1. Hypothyroidism, unspecified type Managed by endocrinology  2. Migraine without status migrainosus, not intractable,  unspecified migraine type Infrequent.  Takes Topamax for prevention  3. Chronic pain of both knees From osteoarthritis.  Likely due to obesity.  Under the care of orthopedic surgery  4. Generalized anxiety disorder Controlled on citalopram  5. Morbid obesity with BMI of 45.0-49.9, adult [facility](HCC) Patient is working on a heart healthy diet, low-fat low-calorie, and trying to increase her exercise  6. Gastroesophageal reflux disease, esophagitis presence not specified Heartburn is a new complaint.  Only for 2 weeks.  With her epigastric tenderness to try 2 weeks of omeprazole.  She will let me know if this does not help her symptoms.   Patient Instructions  Need records Dr Talmage NapBalan in endocrinology, and Dr Reece AgarG. Atkins a [facility]  Continue on  Your heart healthy diet, low fat and low carb Stay as active as you can manage  Take  the omeprazole daily for 2-3 weeks After this go to every other day Let me know if this does not solve the epigastric pain  See me in 2-3 months     Eustace Moore, MD

## 2017-10-09 ENCOUNTER — Institutional Professional Consult (permissible substitution): Payer: 59 | Admitting: Family Medicine

## 2017-10-13 ENCOUNTER — Ambulatory Visit: Payer: 59 | Admitting: Family Medicine

## 2017-10-13 ENCOUNTER — Other Ambulatory Visit: Payer: Self-pay

## 2017-10-13 ENCOUNTER — Encounter: Payer: Self-pay | Admitting: Family Medicine

## 2017-10-13 VITALS — BP 120/82 | HR 80 | Temp 98.0°F | Resp 20 | Ht 65.0 in | Wt 265.1 lb

## 2017-10-13 DIAGNOSIS — R05 Cough: Secondary | ICD-10-CM

## 2017-10-13 DIAGNOSIS — J0100 Acute maxillary sinusitis, unspecified: Secondary | ICD-10-CM

## 2017-10-13 DIAGNOSIS — R059 Cough, unspecified: Secondary | ICD-10-CM

## 2017-10-13 MED ORDER — FLUTICASONE PROPIONATE 50 MCG/ACT NA SUSP: 2.0000 | Freq: Every day | NASAL | 6 refills | Status: DC

## 2017-10-13 MED ORDER — AZITHROMYCIN 250 MG PO TABS
ORAL_TABLET | ORAL | 0 refills | Status: DC
Start: 2017-10-13 — End: 2018-05-12

## 2017-10-13 MED ORDER — BENZONATATE 200 MG PO CAPS
200.0000 mg | ORAL_CAPSULE | Freq: Two times a day (BID) | ORAL | 0 refills | Status: DC | PRN
Start: 2017-10-13 — End: 2018-05-12

## 2017-10-13 NOTE — Patient Instructions (Signed)
Rest Push fluids Take the tessalon for cough Use the flonase twice a day until symptoms resolve May use salt water gargles or nasal wash  Take the antibiotic if the infection worsens or fails to improve  Call for problems

## 2017-10-13 NOTE — Progress Notes (Signed)
Chief Complaint  Patient presents with  . Cough  . Sinusitis   Patient is here for a sick visit. 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   They have been seeing a lot of cough and cold symptoms.  Influenza.  No pneumonia pertussis exposure.  She has been sick since the weekend.  She will try to work Monday and was sent home with a fever.  She tried to work today and was sent home with a fever.  Cough is harsh and persistent.  She does not have shaking chills.  No body aches or malaise.  She does have sinus pain and pressure, inability to clear her nose, thick yellow mucus that smells bad and taste bad, and a moderate sore throat.  No ear pressure or pain.  No underlying asthma or emphysema.  No underlying allergies.  Does not feel prone to sinus infections. Was told by her office, unknown person, to "go get an antibiotic".  Patient Active Problem List   Diagnosis Date Noted  . Hypothyroidism 09/29/2017  . Migraine 09/29/2017  . Knee pain, bilateral 09/29/2017  . Generalized anxiety disorder 09/29/2017  . Morbid obesity with BMI of 45.0-49.9, adult (HCC) 09/29/2017  . Gastroesophageal reflux disease 09/29/2017    Outpatient Encounter Medications as of 10/13/2017  Medication Sig  . citalopram (CELEXA) 10 MG tablet Take 10 mg by mouth daily.  Marland Kitchen. levonorgestrel (MIRENA) 20 MCG/24HR IUD 1 each by Intrauterine route once.  . meloxicam (MOBIC) 7.5 MG tablet Take 2 tablets (15 mg total) by mouth daily as needed for pain.  Marland Kitchen. omeprazole (PRILOSEC) 20 MG capsule Take 1 capsule (20 mg total) by mouth daily.  Marland Kitchen. thyroid (ARMOUR) 120 MG tablet Take 120 mg by mouth daily before breakfast.  . thyroid (ARMOUR) 15 MG tablet Take 15 mg by mouth daily.  Marland Kitchen. topiramate (TOPAMAX) 25 MG tablet   . azithromycin (ZITHROMAX) 250 MG tablet tad  . benzonatate (TESSALON) 200 MG capsule Take 1 capsule (200 mg total) by mouth 2 (two) times daily as needed for cough.  . fluticasone (FLONASE) 50  MCG/ACT nasal spray Place 2 sprays into both nostrils daily.   No facility-administered encounter medications on file as of 10/13/2017.     Allergies  Allergen Reactions  . Penicillins Other (See Comments)    Mother said she had a rash  . Toradol [Ketorolac Tromethamine] Other (See Comments)    "felt like whole body was on fire"    Review of Systems  Constitutional: Positive for fever and unexpected weight change. Negative for activity change, appetite change and fatigue.  HENT: Positive for postnasal drip, rhinorrhea, sinus pressure, sinus pain and sore throat. Negative for congestion and trouble swallowing.   Eyes: Negative for redness and visual disturbance.  Respiratory: Positive for cough. Negative for shortness of breath and wheezing.   Cardiovascular: Negative for chest pain and palpitations.  Gastrointestinal: Negative for diarrhea, nausea and vomiting.  Musculoskeletal: Negative for arthralgias and myalgias.  Neurological: Negative for dizziness and headaches.  Psychiatric/Behavioral: Positive for sleep disturbance.    BP 120/82 (BP Location: Left Arm, Patient Position: Sitting, Cuff Size: Large)   Pulse 80   Temp 98 F (36.7 C) (Oral)   Resp 20   Ht 5\' 5"  (1.651 m)   Wt 265 lb 1.9 oz (120.3 kg)   LMP 09/18/2017 (Exact Date)   SpO2 99%   BMI 44.12 kg/m   Physical Exam  Constitutional: She is oriented to person,  place, and time. She appears well-developed and well-nourished. She appears distressed.  Appears moderately ill  HENT:  Head: Normocephalic and atraumatic.  Right Ear: External ear normal.  Left Ear: External ear normal.  Nasal membranes erythematous, swollen shut.  Scant yellow drainage.  Sinuses are tender.  Posterior pharynx mildly injected.  No exudate.  Eyes: Conjunctivae are normal. Pupils are equal, round, and reactive to light.  Neck: Normal range of motion. Neck supple.  Cardiovascular: Normal rate, regular rhythm and normal heart sounds.    Pulmonary/Chest: Breath sounds normal. She has no wheezes. She has no rales.  Frequent harsh cough.  Lungs are clear  Lymphadenopathy:    She has cervical adenopathy.  Neurological: She is alert and oriented to person, place, and time.  Psychiatric: She has a normal mood and affect. Her behavior is normal.    ASSESSMENT/PLAN:  1. Cough in adult  2. Acute non-recurrent maxillary sinusitis  Discussed with patient that most sinus infections and coughs are caused by a virus.  We discussed the CDC indication that she not take an antibiotic unless she had symptoms longer than 10 days or severe symptoms.  She is given Flonase, and Tessalon, and instructions for symptomatic care.  She is given an antibiotic to fill and take if she has worsening symptoms or persistent symptoms.  I asked the pharmacy to give it a 48-hour delay.   Patient Instructions  Rest Push fluids Take the tessalon for cough Use the flonase twice a day until symptoms resolve May use salt water gargles or nasal wash  Take the antibiotic if the infection worsens or fails to improve  Call for problems   Eustace Moore, MD

## 2017-10-26 ENCOUNTER — Encounter: Payer: Self-pay | Admitting: Family Medicine

## 2017-10-30 ENCOUNTER — Other Ambulatory Visit: Payer: 59

## 2017-10-30 ENCOUNTER — Other Ambulatory Visit: Payer: Self-pay | Admitting: Medical

## 2017-10-30 DIAGNOSIS — E039 Hypothyroidism, unspecified: Secondary | ICD-10-CM | POA: Diagnosis not present

## 2017-10-31 LAB — TSH: TSH: 1.8 u[IU]/mL (ref 0.450–4.500)

## 2017-11-03 ENCOUNTER — Telehealth: Payer: Self-pay

## 2017-11-03 NOTE — Telephone Encounter (Signed)
-----   Message from Jac Canavanavid S Tysinger, PA-C sent at 11/02/2017  8:19 PM EDT ----- Please call and let her know the TSH was within normal.

## 2017-11-03 NOTE — Telephone Encounter (Signed)
Called and notified patient that TSH was normal.

## 2017-11-10 DIAGNOSIS — E049 Nontoxic goiter, unspecified: Secondary | ICD-10-CM | POA: Diagnosis not present

## 2017-11-10 DIAGNOSIS — E039 Hypothyroidism, unspecified: Secondary | ICD-10-CM | POA: Diagnosis not present

## 2017-12-08 ENCOUNTER — Ambulatory Visit: Payer: 59 | Admitting: Family Medicine

## 2017-12-09 ENCOUNTER — Telehealth: Payer: 59 | Admitting: Nurse Practitioner

## 2017-12-09 DIAGNOSIS — N3000 Acute cystitis without hematuria: Secondary | ICD-10-CM

## 2017-12-09 MED ORDER — CIPROFLOXACIN HCL 500 MG PO TABS: 500.0000 mg | ORAL_TABLET | Freq: Two times a day (BID) | ORAL | 0 refills | Status: DC

## 2017-12-09 NOTE — Progress Notes (Signed)

## 2017-12-15 DIAGNOSIS — M545 Low back pain: Secondary | ICD-10-CM | POA: Diagnosis not present

## 2017-12-15 DIAGNOSIS — R82998 Other abnormal findings in urine: Secondary | ICD-10-CM | POA: Diagnosis not present

## 2017-12-15 DIAGNOSIS — Z3202 Encounter for pregnancy test, result negative: Secondary | ICD-10-CM | POA: Diagnosis not present

## 2017-12-15 DIAGNOSIS — N390 Urinary tract infection, site not specified: Secondary | ICD-10-CM | POA: Diagnosis not present

## 2017-12-15 DIAGNOSIS — N809 Endometriosis, unspecified: Secondary | ICD-10-CM | POA: Diagnosis not present

## 2018-01-07 DIAGNOSIS — E039 Hypothyroidism, unspecified: Secondary | ICD-10-CM | POA: Diagnosis not present

## 2018-01-26 ENCOUNTER — Ambulatory Visit (INDEPENDENT_AMBULATORY_CARE_PROVIDER_SITE_OTHER): Payer: 59

## 2018-01-26 ENCOUNTER — Ambulatory Visit (INDEPENDENT_AMBULATORY_CARE_PROVIDER_SITE_OTHER): Payer: 59 | Admitting: Orthopaedic Surgery

## 2018-01-26 ENCOUNTER — Encounter (INDEPENDENT_AMBULATORY_CARE_PROVIDER_SITE_OTHER): Payer: Self-pay | Admitting: Orthopaedic Surgery

## 2018-01-26 DIAGNOSIS — M25561 Pain in right knee: Secondary | ICD-10-CM | POA: Diagnosis not present

## 2018-01-26 DIAGNOSIS — G8929 Other chronic pain: Secondary | ICD-10-CM

## 2018-01-26 MED ORDER — BUPIVACAINE HCL 0.5 % IJ SOLN
2.0000 mL | INTRAMUSCULAR | Status: AC | PRN
  Administered 2018-01-26: 2 mL via INTRA_ARTICULAR

## 2018-01-26 MED ORDER — METHYLPREDNISOLONE ACETATE 40 MG/ML IJ SUSP
40.0000 mg | INTRAMUSCULAR | Status: AC | PRN
  Administered 2018-01-26: 40 mg via INTRA_ARTICULAR

## 2018-01-26 MED ORDER — LIDOCAINE HCL 1 % IJ SOLN
2.0000 mL | INTRAMUSCULAR | Status: AC | PRN
Start: 2018-01-26 — End: 2018-01-26
  Administered 2018-01-26: 2 mL

## 2018-01-26 NOTE — Progress Notes (Signed)
Office Visit Note   Patient: Donna Mccarthy           Date of Birth: 05-21-1976           MRN: [id number] Visit Date: 01/26/2018              Requested by: No referring provider defined for this encounter. PCP: Avanell Shackleton, NP-C   Assessment & Plan: Visit Diagnoses:  1. Chronic pain of right knee     Plan: Impression is exacerbation of her right knee osteoarthritis.  25 cc of blood-tinged joint fluid were aspirated from the knee joint.  No evidence of infection.  Patient tolerated this well.  Follow-up as needed.  She is doing an excellent job of weight loss.  Follow-Up Instructions: Return if symptoms worsen or fail to improve.   Orders:  Orders Placed This Encounter  Procedures  . XR KNEE 3 VIEW RIGHT   No orders of the defined types were placed in this encounter.     Procedures: Large Joint Inj: R knee on 01/26/2018 4:35 PM Indications: pain Details: 22 G needle  Arthrogram: No  Medications: 40 mg methylPREDNISolone acetate 40 MG/ML; 2 mL lidocaine 1 %; 2 mL bupivacaine 0.5 % Aspirate: 25 mL blood-tinged Outcome: tolerated well, no immediate complications Consent was given by the patient. Patient was prepped and draped in the usual sterile fashion.       Clinical Data: No additional findings.   Subjective: Chief Complaint  Patient presents with  . Right Knee - Pain    Patient is a 42 year old female who comes in with recent worsening swelling of her right knee.  I previously saw her back in September for similar issue.  We were able to aspirate and inject her knee at that time.  She is very good.  She has lost about 60 pounds since I last saw her 9 months ago.  She is overall doing well.   Review of Systems  Constitutional: Negative.   HENT: Negative.   Eyes: Negative.   Respiratory: Negative.   Cardiovascular: Negative.   Endocrine: Negative.   Musculoskeletal: Negative.   Neurological: Negative.   Hematological: Negative.     Psychiatric/Behavioral: Negative.   All other systems reviewed and are negative.    Objective: Vital Signs: There were no vitals taken for this visit.  Physical Exam  Constitutional: She is oriented to person, place, and time. She appears well-developed and well-nourished.  Pulmonary/Chest: Effort normal.  Neurological: She is alert and oriented to person, place, and time.  Skin: Skin is warm. Capillary refill takes less than 2 seconds.  Psychiatric: She has a normal mood and affect. Her behavior is normal. Judgment and thought content normal.  Nursing note and vitals reviewed.   Ortho Exam Right knee exam shows a joint effusion.  Collaterals and cruciates are stable. Specialty Comments:  No specialty comments available.  Imaging: Xr Knee 3 View Right  Result Date: 01/26/2018 Moderate degenerative joint disease    PMFS History: Patient Active Problem List   Diagnosis Date Noted  . Hypothyroidism 09/29/2017  . Migraine 09/29/2017  . Knee pain, bilateral 09/29/2017  . Generalized anxiety disorder 09/29/2017  . Morbid obesity with BMI of 45.0-49.9, adult (HCC) 09/29/2017  . Gastroesophageal reflux disease 09/29/2017   Past Medical History:  Diagnosis Date  . Allergy   . Anemia   . Anxiety   . Arthritis    arthritis knees  . Blood transfusion without reported diagnosis   .  GERD (gastroesophageal reflux disease)   . Hypothyroidism     Family History  Problem Relation Age of Onset  . Hypertension Mother   . Hypertension Father   . Stroke Father        TIA  . Hyperlipidemia Father     History reviewed. No pertinent surgical history. Social History   Occupational History  . Occupation: cma    Employer: [employer]  Tobacco Use  . Smoking status: Never Smoker  . Smokeless tobacco: Never Used  Substance and Sexual Activity  . Alcohol use: No  . Drug use: No  . Sexual activity: Yes    Birth control/protection: IUD

## 2018-01-27 ENCOUNTER — Other Ambulatory Visit (INDEPENDENT_AMBULATORY_CARE_PROVIDER_SITE_OTHER): Payer: Self-pay | Admitting: Orthopaedic Surgery

## 2018-03-23 MED FILL — MELOXICAM 7.5 MG TABLET: 7.5 | 15 days supply | Qty: 30 | Fill #0

## 2018-03-23 MED FILL — TOPIRAMATE 25 MG TAB: 25 | 30 days supply | Qty: 90 | Fill #0

## 2018-05-06 LAB — HM PAP SMEAR: HM Pap smear: NEGATIVE

## 2018-05-11 MED FILL — TOPIRAMATE 25 MG TAB: 25 | 30 days supply | Qty: 90 | Fill #1

## 2018-05-12 ENCOUNTER — Encounter: Payer: Self-pay | Admitting: Family Medicine

## 2018-05-12 ENCOUNTER — Ambulatory Visit: Payer: 59 | Admitting: Family Medicine

## 2018-05-12 VITALS — BP 122/82 | HR 77 | Temp 97.7°F | Wt 246.4 lb

## 2018-05-12 DIAGNOSIS — N3000 Acute cystitis without hematuria: Secondary | ICD-10-CM | POA: Diagnosis not present

## 2018-05-12 DIAGNOSIS — R109 Unspecified abdominal pain: Secondary | ICD-10-CM | POA: Diagnosis not present

## 2018-05-12 DIAGNOSIS — R350 Frequency of micturition: Secondary | ICD-10-CM

## 2018-05-12 DIAGNOSIS — R300 Dysuria: Secondary | ICD-10-CM

## 2018-05-12 LAB — POCT URINALYSIS DIP (PROADVANTAGE DEVICE)
BILIRUBIN UA: NEGATIVE mg/dL
Bilirubin, UA: NEGATIVE
Glucose, UA: NEGATIVE mg/dL
Leukocytes, UA: NEGATIVE
Nitrite, UA: NEGATIVE
PROTEIN UA: NEGATIVE mg/dL
RBC UA: NEGATIVE
Specific Gravity, Urine: 1.015
UUROB: NEGATIVE
pH, UA: 6 (ref 5.0–8.0)

## 2018-05-12 MED ORDER — NITROFURANTOIN MONOHYD MACRO 100 MG PO CAPS: 100.0000 mg | ORAL_CAPSULE | Freq: Two times a day (BID) | ORAL | 0 refills | Status: DC

## 2018-05-12 MED FILL — NITROFURANTOIN MONO-MCR 100: 100 | 5 days supply | Qty: 10 | Fill #0

## 2018-05-12 NOTE — Progress Notes (Signed)
Subjective:  Donna Mccarthy is a 42 y.o. female who complains of possible urinary tract infection.  She has had symptoms for 3 days.  Symptoms include urinary frequency, urgency, blood, suprapubic pressure and right flank pain, nausea. States she only saw a scant amount of blood on the toilet tissue yesterday and none today. Patient denies fever, chills, vomiting, diarrhea, vaginal discharge .  Last UTI was April 2019.   Using nothing for current symptoms.  Questions whether she could have seen a stone in the toilet yesterday.  No history of kidney stones.  No recent sexual activity. Declines STD testing.   Patient does not have a history of recurrent UTI. Patient does not have a history of pyelonephritis.  No other aggravating or relieving factors.  No other c/o.  Past Medical History:  Diagnosis Date  . Allergy   . Anemia   . Anxiety   . Arthritis    arthritis knees  . Blood transfusion without reported diagnosis   . GERD (gastroesophageal reflux disease)   . Hypothyroidism     ROS as in subjective  Reviewed allergies, medications, past medical, surgical, and social history.    Objective: Vitals:   05/12/18 1146  BP: 122/82  Pulse: 77  Temp: 97.7 F (36.5 C)    General appearance: alert, no distress, WD/WN, female Abdomen: +bs, soft, suprapubic tenderness without rebound, non distended, no masses, no hepatomegaly, no splenomegaly, no bruits Back: questionable left CVA tenderness (flank pain was on right yesterday) GU: declines      Laboratory:  Urine dipstick: negative for all components.       Assessment: Acute cystitis without hematuria - Plan: nitrofurantoin, macrocrystal-monohydrate, (MACROBID) 100 MG capsule, Urine Culture  Burning with urination - Plan: POCT Urinalysis DIP (Proadvantage Device), Urine Culture  Flank pain - Plan: Urine Culture  Urinary frequency - Plan: Urine Culture    Plan: Discussed symptoms, diagnosis, possible complications, and  usual course of illness.  No distress. Normal vitals.   Begin Macrobid.  Advised increased water intake, can use OTC Tylenol for pain.       Urine culture sent.     Call or return if worse or not improving.

## 2018-05-13 LAB — URINE CULTURE: ORGANISM ID, BACTERIA: NO GROWTH

## 2018-06-01 DIAGNOSIS — Z1329 Encounter for screening for other suspected endocrine disorder: Secondary | ICD-10-CM | POA: Diagnosis not present

## 2018-06-01 DIAGNOSIS — Z113 Encounter for screening for infections with a predominantly sexual mode of transmission: Secondary | ICD-10-CM | POA: Diagnosis not present

## 2018-06-01 DIAGNOSIS — Z1321 Encounter for screening for nutritional disorder: Secondary | ICD-10-CM | POA: Diagnosis not present

## 2018-06-01 DIAGNOSIS — Z6839 Body mass index (BMI) 39.0-39.9, adult: Secondary | ICD-10-CM | POA: Diagnosis not present

## 2018-06-01 DIAGNOSIS — Z13220 Encounter for screening for lipoid disorders: Secondary | ICD-10-CM | POA: Diagnosis not present

## 2018-06-01 DIAGNOSIS — Z01419 Encounter for gynecological examination (general) (routine) without abnormal findings: Secondary | ICD-10-CM | POA: Diagnosis not present

## 2018-06-01 DIAGNOSIS — Z1231 Encounter for screening mammogram for malignant neoplasm of breast: Secondary | ICD-10-CM | POA: Diagnosis not present

## 2018-06-01 DIAGNOSIS — Z131 Encounter for screening for diabetes mellitus: Secondary | ICD-10-CM | POA: Diagnosis not present

## 2018-06-01 MED FILL — DOXYCYCLINE HYCLATE 100 MG: 100 | 14 days supply | Qty: 14 | Fill #0

## 2018-06-07 MED FILL — VIT D2 1.25 MG (50,000 UNIT: 1.25 MG | 28 days supply | Qty: 4 | Fill #0

## 2018-06-08 DIAGNOSIS — Z30431 Encounter for routine checking of intrauterine contraceptive device: Secondary | ICD-10-CM | POA: Diagnosis not present

## 2018-06-08 DIAGNOSIS — R102 Pelvic and perineal pain: Secondary | ICD-10-CM | POA: Diagnosis not present

## 2018-07-02 ENCOUNTER — Ambulatory Visit: Payer: 59 | Admitting: Medical

## 2018-07-02 ENCOUNTER — Encounter: Payer: Self-pay | Admitting: Medical

## 2018-07-02 VITALS — BP 120/70 | HR 68 | Temp 98.3°F | Resp 16

## 2018-07-02 DIAGNOSIS — R6883 Chills (without fever): Secondary | ICD-10-CM

## 2018-07-02 DIAGNOSIS — R52 Pain, unspecified: Secondary | ICD-10-CM

## 2018-07-02 DIAGNOSIS — R05 Cough: Secondary | ICD-10-CM

## 2018-07-02 DIAGNOSIS — R6889 Other general symptoms and signs: Secondary | ICD-10-CM

## 2018-07-02 DIAGNOSIS — R059 Cough, unspecified: Secondary | ICD-10-CM

## 2018-07-02 LAB — POC INFLUENZA A&B (BINAX/QUICKVUE)
Influenza A, POC: NEGATIVE
Influenza B, POC: NEGATIVE

## 2018-07-02 MED ORDER — OSELTAMIVIR PHOSPHATE 75 MG PO CAPS: 75.0000 mg | ORAL_CAPSULE | Freq: Two times a day (BID) | ORAL | 0 refills | Status: DC

## 2018-07-02 MED FILL — OSELTAMIVIR PHOSPHATE 75 MG: 75 | 5 days supply | Qty: 10 | Fill #0

## 2018-07-02 NOTE — Patient Instructions (Signed)
Thank you for giving me the opportunity to serve you today.   Your diagnosis today includes: Encounter Diagnoses  Name Primary?  . Cough Yes  . Body aches   . Chills     Medications prescribed today: Tamiflu   Specific home care recommendations today include:  Only take over-the-counter (OTC) or prescription medicines for pain, discomfort, or fever as directed by your caregiver.    Decongestant: You may use OTC Guaifenesin (Mucinex plain) for congestion.  You may use Pseudoephedrine (Sudafed) only if you don't have blood pressure problems or a diagnosis of hypertension.  Cough suppression: If you have cough from drainage, you may use over-the-counter Dextromethorphan (Delsym) as directed on the label  Sore throat remedies:  You may use salt water gargles, warm fluids such as coffee or hot tea, or honey/tea/lemon mixture to sooth sore throat pain.  You may use OTC sore throat remedies such as Cepacol lozenges or Chloraseptic spray for sore throat pain.  Runny nose and sneezing remedies: You may use OTC antihistamine such as Zyrtec or Benadryl, but caution as these can cause drowsiness.    Pain/fever relief: You may use over-the-counter Tylenol for pain or fever  Drink extra fluids. Fluids help thin the mucus so your sinuses can drain more easily.   Applying either moist heat or ice packs to the sinus areas may help relieve discomfort.  Use saline nasal sprays to help moisten your sinuses. The sprays can be found at your local drugstore.    Please call or return if worse or not improving in the next few days.    Medication costs:  If you get to the pharmacy and medication prescribed today was either too expensive, not covered by your insurance, or required prior authorization, then please call us back to let us know.  We often have no way to know if a medication is too expensive or not covered by your insurance.  Thanks for your cooperation.      I have included other useful  information below for your review.   Influenza, Adult Influenza ("the flu") is a viral infection of the respiratory tract. It causes chills, fever, cough, headache, body aches, and sore throat. Influenza in general will make you feel sicker than when you have a common cold. Symptoms of the illness typically last a few days. Cough and fatigue may continue for as long as 7 to 10 days. Influenza is highly contagious. It spreads easily to others in the droplets from coughs and sneezes. People frequently become infected by touching something that was recently contaminated with the virus and then touch their mouth, nose or eyes. This infection is caused by a virus. Symptoms will not be reduced or improved by taking an antibiotic. Antibiotics are medications that kill bacteria, not viruses. DIAGNOSIS  Diagnosis of influenza is often made based on the history and physical examination as well as the presence of influenza reports occurring in your community. Testing can be done if the diagnosis is not certain. TREATMENT  Since influenza is caused by a virus, antibiotics are not helpful. Your caregiver may prescribe antiviral medicines to shorten the illness and lessen the severity. Your caregiver may also recommend influenza vaccination and/or antiviral medicines for your family members in order to prevent the spread of influenza to them. HOME CARE INSTRUCTIONS  DO NOT GIVE ASPIRIN TO PERSONS WITH INFLUENZA WHO ARE UNDER 24 YEARS OF AGE. This could lead to brain and liver damage (Reye's syndrome). Read the label on over-the-counter  medicines.   Stay home from work or school if at all possible until most of your symptoms are gone.   Only take over-the-counter or prescription medicines for pain, discomfort, or fever as directed by your caregiver.   Use a cool mist humidifier to increase air moisture. This will make breathing easier.   Rest until your temperature is nearly normal: 98.6 F (37 C). This usually  takes 3 to 4 days. Be sure you get plenty of rest.   Drink at least eight, eight-ounce glasses of fluids per day. Fluids include water, juice, broth, gelatin, or lemonade.   Cover your mouth and nose when coughing or sneezing and wash your hands often to prevent the spread of this virus to other persons.  PREVENTION  Annual influenza vaccination (flu shots) is the best way to avoid getting influenza. An annual flu shot is now routinely recommended for all adults in the [place] SEEK MEDICAL CARE IF:   You develop shortness of breath while resting.   You have a deep cough with production of mucous or chest pain.   You develop nausea (feeling sick to your stomach), vomiting, or diarrhea.  SEEK IMMEDIATE MEDICAL CARE IF:   You have difficulty breathing, become short of breath, or your skin or nails turn bluish.   You develop severe neck pain or stiffness.   You develop a severe headache, facial pain, or earache.   You have a fever.   You develop nausea or vomiting that cannot be controlled.  Document Released: 08/01/2000 Document Revised: 04/16/2011 Document Reviewed: 06/06/2009 [facility] Patient Information 2012 [place] [place].

## 2018-07-02 NOTE — Progress Notes (Signed)
Subjective Chief Complaint  Patient presents with  . flu    cough, congestion, achey, sore throat, chills X wed   Here for 1.5 day hx/o illness.   She reports cough, sore throat, nasal congestion, productive cough, body aches, chills, not sure about fever.   Has felt hot and cold alternating.   No wheezing, mild dyspnea.    Using severe cold and sinus.  Got mucinex from pharmacy yesterday.  [place] probably around sick [place].    Using some tylneol as well.  No other aggravating or relieving factors. No other complaint.  Past Medical History:  Diagnosis Date  . Allergy   . Anemia   . Anxiety   . Arthritis    arthritis knees  . Blood transfusion without reported diagnosis   . GERD (gastroesophageal reflux disease)   . Hypothyroidism    Current Outpatient Medications on File Prior to Visit  Medication Sig Dispense Refill  . levonorgestrel (MIRENA) 20 MCG/24HR IUD 1 each by Intrauterine route once.    . meloxicam (MOBIC) 7.5 MG tablet TAKE 2 TABLETS ONCE DAILY AS NEEDED FOR PAIN. 30 tablet 0  . thyroid (ARMOUR) 120 MG tablet Take 120 mg by mouth daily before breakfast.    . thyroid (ARMOUR) 15 MG tablet Take 15 mg by mouth daily.    Marland Kitchen. topiramate (TOPAMAX) 25 MG tablet 75 mg.     . Vitamin D, Ergocalciferol, (DRISDOL) 1.25 MG (50000 UT) CAPS capsule Take 50,000 Units by mouth once a week.  0  . citalopram (CELEXA) 10 MG tablet Take 10 mg by mouth daily.    . nitrofurantoin, macrocrystal-monohydrate, (MACROBID) 100 MG capsule Take 1 capsule (100 mg total) by mouth 2 (two) times daily. (Patient not taking: Reported on 07/02/2018) 10 capsule 0   No current facility-administered medications on file prior to visit.    ROS as in subjective    Objective: BP 120/70   Pulse 68   Temp 98.3 F (36.8 C) (Oral)   Resp 16   SpO2 100%   General appearance: Alert, WD/WN, no distress,mildly ill appearing                             Skin: warm, no rash, no  diaphoresis                           Head: no sinus tenderness                            Eyes: conjunctiva normal, corneas clear, PERRLA                            Ears: pearly TMs, external ear canals normal                          Nose: septum midline, turbinates swollen, with erythema and clear discharge             Mouth/throat: MMM, tongue normal, mild pharyngeal erythema                           Neck: supple, no adenopathy, no thyromegaly, non tender  Lungs: clear, no rhonchi, no wheezes, no rales                Extremities: no edema, non tender       Assessment: Encounter Diagnoses  Name Primary?  . Flu-like symptoms Yes  . Cough   . Body aches   . Chills      Plan Prescription given for Tamiflu, discussed risks/benefits of medication.    Discussed diagnosis of influenza. Discussed supportive care including rest, hydration, OTC Tylenol or NSAID for fever, aches, and malaise.  Discussed period of contagion, self quarantine at home away from others to avoid spread of disease, discussed means of transmission, and possible complications including pneumonia.  If worse or not improving within the next 4-5 days, then call or return.  Patient voiced understanding of diagnosis, recommendations, and treatment plan.  After visit summary given.  Gave note for work.  Coley was seen today for flu.  Diagnoses and all orders for this visit:  Flu-like symptoms  Cough -     POC Influenza A&B(BINAX/QUICKVUE)  Body aches -     POC Influenza A&B(BINAX/QUICKVUE)  Chills -     POC Influenza A&B(BINAX/QUICKVUE)  Other orders -     oseltamivir (TAMIFLU) 75 MG capsule; Take 1 capsule (75 mg total) by mouth 2 (two) times daily.

## 2018-07-06 ENCOUNTER — Ambulatory Visit: Payer: 59 | Admitting: Medical

## 2018-07-06 MED FILL — AZITHROMYCIN 250 MG TABLET: 250 | 5 days supply | Qty: 6 | Fill #0

## 2018-07-14 MED FILL — TOPIRAMATE 25 MG TAB: 25 | 30 days supply | Qty: 90 | Fill #2

## 2018-09-06 MED FILL — TOPIRAMATE 25 MG TAB: 25 | 30 days supply | Qty: 90 | Fill #0

## 2018-09-07 MED FILL — ARMOUR THYROID 180 MG TAB: 180 | 30 days supply | Qty: 30 | Fill #0

## 2018-10-18 MED FILL — ARMOUR THYROID 180 MG TAB: 180 | 30 days supply | Qty: 30 | Fill #1

## 2018-10-18 MED FILL — TOPIRAMATE 25 MG TAB: 25 | 30 days supply | Qty: 90 | Fill #1

## 2018-11-11 ENCOUNTER — Telehealth: Payer: Self-pay

## 2018-11-11 ENCOUNTER — Telehealth: Payer: Self-pay | Admitting: Internal Medicine

## 2018-11-11 DIAGNOSIS — E039 Hypothyroidism, unspecified: Secondary | ICD-10-CM

## 2018-11-11 NOTE — Telephone Encounter (Signed)
Pt needs a referral for Dr. Talmage Nap since she has the [facility] plan. She has never been seen for thyroid issues here, only acute. Please advise. Selena Batten has called to give the ok for referral to Dr. Talmage Nap. She has an appt today or tomorrow

## 2018-11-11 NOTE — Telephone Encounter (Signed)
Put in referral.  

## 2018-11-11 NOTE — Addendum Note (Signed)
Addended by: Herminio Commons A on: 11/11/2018 11:17 AM   Modules accepted: Orders

## 2018-11-11 NOTE — Telephone Encounter (Signed)
This is fine since she has already been a patient with Dr. Talmage Nap

## 2018-11-11 NOTE — Telephone Encounter (Signed)
Pt was made aware of referral being called in to Dr. Talmage Nap office for her thyroid. Donna Mccarthy

## 2018-11-11 NOTE — Telephone Encounter (Signed)
See previous message

## 2018-11-12 ENCOUNTER — Other Ambulatory Visit: Payer: Self-pay | Admitting: Endocrinology

## 2018-11-12 DIAGNOSIS — E049 Nontoxic goiter, unspecified: Secondary | ICD-10-CM

## 2019-01-05 ENCOUNTER — Other Ambulatory Visit: Payer: Self-pay

## 2019-01-05 ENCOUNTER — Ambulatory Visit (INDEPENDENT_AMBULATORY_CARE_PROVIDER_SITE_OTHER): Payer: No Typology Code available for payment source | Admitting: Family Medicine

## 2019-01-05 ENCOUNTER — Encounter: Payer: Self-pay | Admitting: Family Medicine

## 2019-01-05 VITALS — BP 122/76 | HR 80 | Wt 229.0 lb

## 2019-01-05 DIAGNOSIS — G43909 Migraine, unspecified, not intractable, without status migrainosus: Secondary | ICD-10-CM | POA: Diagnosis not present

## 2019-01-05 DIAGNOSIS — E669 Obesity, unspecified: Secondary | ICD-10-CM

## 2019-01-05 HISTORY — DX: Obesity, unspecified: E66.9

## 2019-01-05 MED ORDER — TOPIRAMATE 25 MG PO TABS: 75.0000 mg | ORAL_TABLET | Freq: Every day | ORAL | 2 refills | Status: DC

## 2019-01-05 MED FILL — TOPIRAMATE 25 MG TAB: 25 | 30 days supply | Qty: 90 | Fill #0

## 2019-01-05 NOTE — Progress Notes (Signed)
   Subjective:   Documentation for virtual audio and video telecommunications through [employer] encounter:  The patient was located at home. 2 patient identifiers used.  The provider was located in the office. The patient did consent to this visit and is aware of possible charges through their insurance for this visit.  The other persons participating in this telemedicine service were none.    Patient ID: Donna Mccarthy, female    DOB: [birth year], 43 y.o.   MRN: [id number]  HPI Chief Complaint  Patient presents with  . discuss topamax    discuss topamax. on this med for 12 years. headache prevention   She would like to establish with me as her PCP starting today.  I have seen in the past for an acute visit. States her previous PCP in [place] is no longer there. Her main concern today is needing a refill on Topamax.  Denies currently having a migraine headache.  Reports history of migraine headaches for 12 or more years. Her PCP started her on Topamax for prevention and this is helped significantly.  She has been taking Topamax 75 mg at bedtime for the past 2 years and states this is reduced the frequency and severity of her migraine headaches.  Prior to that she was on 50 mg and having frequent migraine headaches. States she typically has a migraine headache every 2 to 3 months now. States her migraines are typically around her cycle or with increased stress. Her last migraine headache was 4 weeks ago.   States prior to headache she starts having nausea, feeling lightheaded and then develops pain behind her right eye typically.  She also reports that she usually has photophobia and phonophobia.  Takes ibuprofen 800 mg and sleeps to break her migraines.   Denies fever, chills, dizziness, chest pain, palpitations, shortness of breath, abdominal pain, N/V/D.   Has IUD.   Sees OB/GYN Dr. Renaldo Fiddler Endocrinologist is Dr. Talmage Nap    Review of Systems Pertinent positives and negatives in  the history of present illness.     Objective:   Physical Exam BP 122/76   Pulse 80   Wt 229 lb (103.9 kg)   LMP 12/21/2018   BMI 38.11 kg/m   Alert and oriented and in no acute distress.  Face is symmetric.  Speech is clear.  Unable to further examine due to virtual visit.      Assessment & Plan:  Migraine without status migrainosus, not intractable, unspecified migraine type - Plan: topiramate (TOPAMAX) 25 MG tablet  Discussed limitations of a virtual visit. States she would like for me to be her PCP since her previous one is no longer practicing. Her main concern today is getting a refill of Topamax.  She has been on her current dose for at least 2 years and is doing well.  Typically her migraines are once every 2 to 3 months. Discussed following up in 3 months and we may also do a CPE at that time if she would like.  Time spent on call was 15 minutes and in review of previous records 2 minutes total.  This virtual service is not related to other E/M service within previous 7 days.

## 2019-01-07 ENCOUNTER — Other Ambulatory Visit: Payer: 59

## 2019-01-12 MED FILL — NP THYROID 120 MG TABLET: 120 | 30 days supply | Qty: 30 | Fill #0

## 2019-01-12 MED FILL — NP THYROID 30 MG TABLET: 30 | 30 days supply | Qty: 30 | Fill #0

## 2019-01-18 ENCOUNTER — Ambulatory Visit
Admission: RE | Admit: 2019-01-18 | Discharge: 2019-01-18 | Disposition: A | Discharge status: Home / Self Care | Payer: No Typology Code available for payment source | Source: Ambulatory Visit | Attending: Endocrinology | Admitting: Endocrinology

## 2019-01-18 DIAGNOSIS — E049 Nontoxic goiter, unspecified: Secondary | ICD-10-CM

## 2019-01-21 ENCOUNTER — Other Ambulatory Visit: Payer: 59

## 2019-01-24 ENCOUNTER — Other Ambulatory Visit: Payer: 59

## 2019-02-10 MED FILL — NAPROXEN 500 MG TABLET: 500 | 15 days supply | Qty: 30 | Fill #0

## 2019-02-15 MED FILL — DOXYCYCLINE HYCLATE 100 MG: 100 | 7 days supply | Qty: 14 | Fill #0

## 2019-02-18 MED FILL — NP THYROID 120 MG TABLET: 120 | 30 days supply | Qty: 30 | Fill #1

## 2019-02-18 MED FILL — NP THYROID 30 MG TABLET: 30 | 30 days supply | Qty: 30 | Fill #1

## 2019-03-07 MED FILL — TOPIRAMATE 25 MG TAB: 25 | 30 days supply | Qty: 90 | Fill #1

## 2019-03-24 MED FILL — NP THYROID 120 MG TABLET: 120 | 30 days supply | Qty: 30 | Fill #2

## 2019-03-24 MED FILL — NP THYROID 30 MG TABLET: 30 | 30 days supply | Qty: 30 | Fill #2

## 2019-04-20 ENCOUNTER — Ambulatory Visit (INDEPENDENT_AMBULATORY_CARE_PROVIDER_SITE_OTHER): Payer: No Typology Code available for payment source | Admitting: Family Medicine

## 2019-04-20 ENCOUNTER — Encounter: Payer: Self-pay | Admitting: Family Medicine

## 2019-04-20 ENCOUNTER — Other Ambulatory Visit: Payer: Self-pay

## 2019-04-20 ENCOUNTER — Other Ambulatory Visit: Payer: Self-pay | Admitting: Family Medicine

## 2019-04-20 VITALS — BP 128/80 | HR 78 | Temp 97.8°F | Ht 66.5 in | Wt 217.8 lb

## 2019-04-20 DIAGNOSIS — Z833 Family history of diabetes mellitus: Secondary | ICD-10-CM

## 2019-04-20 DIAGNOSIS — G43909 Migraine, unspecified, not intractable, without status migrainosus: Secondary | ICD-10-CM

## 2019-04-20 DIAGNOSIS — Z0000 Encounter for general adult medical examination without abnormal findings: Secondary | ICD-10-CM | POA: Diagnosis not present

## 2019-04-20 DIAGNOSIS — Z113 Encounter for screening for infections with a predominantly sexual mode of transmission: Secondary | ICD-10-CM | POA: Diagnosis not present

## 2019-04-20 DIAGNOSIS — Z131 Encounter for screening for diabetes mellitus: Secondary | ICD-10-CM

## 2019-04-20 DIAGNOSIS — Z13220 Encounter for screening for lipoid disorders: Secondary | ICD-10-CM | POA: Diagnosis not present

## 2019-04-20 DIAGNOSIS — E66811 Obesity, class 1: Secondary | ICD-10-CM

## 2019-04-20 DIAGNOSIS — E079 Disorder of thyroid, unspecified: Secondary | ICD-10-CM | POA: Diagnosis not present

## 2019-04-20 DIAGNOSIS — E669 Obesity, unspecified: Secondary | ICD-10-CM

## 2019-04-20 LAB — POCT URINALYSIS DIP (PROADVANTAGE DEVICE)
Bilirubin, UA: NEGATIVE
Blood, UA: NEGATIVE
Glucose, UA: NEGATIVE mg/dL
Ketones, POC UA: NEGATIVE mg/dL
Leukocytes, UA: NEGATIVE
Nitrite, UA: NEGATIVE
Protein Ur, POC: NEGATIVE mg/dL
Specific Gravity, Urine: 1.015
Urobilinogen, Ur: NEGATIVE
pH, UA: 7 (ref 5.0–8.0)

## 2019-04-20 MED FILL — TOPIRAMATE 25 MG TAB: 25 | 30 days supply | Qty: 90 | Fill #2

## 2019-04-20 MED FILL — NAPROXEN 500 MG TABLET: 500 | 15 days supply | Qty: 30 | Fill #1

## 2019-04-20 NOTE — Patient Instructions (Signed)

## 2019-04-20 NOTE — Progress Notes (Signed)
Subjective:    Patient ID: Donna Mccarthy, female    DOB: 01/23/1976, 43 y.o.   MRN: [id number]  HPI Chief Complaint  Patient presents with  . fasting cpe    fasitng cpe, has eye appt with [facility] in [place] today, will get flu shot with work   She is here for a complete physical exam. Previous medical care: [place]   She requests STD screening due to finding out her partner was seeing someone else. Asymptomatic.   Plans to see plastic surgeon for excess skin after 90+ lbs weight loss.    Other providers: OB/GYN- Dr. Vickey SagesAtkins  Orthopedist- Dr. Roda ShuttersXu  Endocrinologist- Dr. Talmage NapBalan   Social history: Lives alone, [place], her son is a Holiday representativejunior at [employer] , she works for [employer].  Denies smoking, drinking alcohol, drug use  Diet: does not eat meat. Low carb. Lost 93 lbs from changing diet and increasing physical activity  Excerise: fairly regular.   Immunizations: UTD   Health maintenance:  Mammogram: last year  Colonoscopy: never  Last Gynecological Exam: last year  Last Menstrual cycle: 04/03/19 Has IUD.  Hx of swollen uterus. Followed by OB/GYN  Last Dental Exam: overdue due to Covid-19  Last Eye Exam: has appt later today   Wears seatbelt always, smoke detectors in home and functioning, does not text while driving and feels safe in home environment.   Reviewed allergies, medications, past medical, surgical, family, and social history.   Review of Systems Review of Systems Constitutional: -fever, -chills, -sweats, -unexpected weight change,-fatigue ENT: -runny nose, -ear pain, -sore throat Cardiology:  -chest pain, -palpitations, -edema Respiratory: -cough, -shortness of breath, -wheezing Gastroenterology: -abdominal pain, -nausea, -vomiting, -diarrhea, -constipation  Hematology: -bleeding or bruising problems Musculoskeletal: -arthralgias, -myalgias, -joint swelling, -back pain Ophthalmology: -vision changes Urology:  -dysuria, -difficulty urinating, -hematuria, -urinary frequency, -urgency Neurology: -headache, -weakness, -tingling, -numbness       Objective:   Physical Exam BP 128/80   Pulse 78   Temp 97.8 F (36.6 C)   Ht 5' 6.5" (1.689 m)   Wt 217 lb 12.8 oz (98.8 kg)   LMP 04/03/2019   BMI 34.63 kg/m   General Appearance:    Alert, cooperative, no distress, appears stated age  Head:    Normocephalic, without obvious abnormality, atraumatic  Eyes:    PERRL, conjunctiva/corneas clear, EOM's intact, fundi    benign  Ears:    Normal TM's and external ear canals  Nose:   Mask in place   Throat:   Mask in place   Neck:   Supple, no lymphadenopathy;  thyroid:  no   enlargement/tenderness/nodules; no carotid   bruit or JVD  Back:    Spine nontender, no curvature, ROM normal, no CVA     tenderness  Lungs:     Clear to auscultation bilaterally without wheezes, rales or     ronchi; respirations unlabored  Chest Wall:    No tenderness or deformity   Heart:    Regular rate and rhythm, S1 and S2 normal, no murmur, rub   or gallop  Breast Exam:    OB/GYN  Abdomen:     Soft, non-tender, nondistended, normoactive bowel sounds,    no masses, no hepatosplenomegaly  Genitalia:    OB/GYN. Self swab performed for STD testing per patient request.      Extremities:   No clubbing, cyanosis or edema  Pulses:   2+ and symmetric all extremities  Skin:   Skin color, texture, turgor normal, no rashes or lesions  Lymph nodes:   Cervical, supraclavicular, and axillary nodes normal  Neurologic:   CNII-XII intact, normal strength, sensation and gait; reflexes 2+ and symmetric throughout          Psych:   Normal mood, affect, hygiene and grooming.         Assessment & Plan:  Routine general medical examination at a health care facility - Plan: CBC with Differential/Platelet, Comprehensive metabolic panel, POCT Urinalysis DIP (Proadvantage Device), TSH, T4, free, Lipid panel -Here today for a fasting CPE.  She is  doing well physically and emotionally.  She is pleased with her significant weight loss.  Discussed preventive healthcare.  She does see OB/GYN.  Discussed safety and health promotion. Immunizations are through [employer].  She is getting her flu vaccine through work.  Screen for STD (sexually transmitted disease) - Plan: RPR, HIV Antibody (routine testing w rflx), NuSwab Vaginitis Plus (VG+).  Asymptomatic.  Screening done per patient request.  She did a self swab today.  Follow-up pending results  Thyroid disorder - Plan: TSH, T4, free-managed by Dr. Chalmers Cater  Screening for lipid disorders - Plan: Lipid panel.  She has removed meat from her diet.  Follow-up pending results  Screening for diabetes mellitus - Plan: Hemoglobin A1c.  She is eating a low-carb diet.  Follow-up pending results  Family history of diabetes mellitus in grandmother - Plan: Hemoglobin A1c  Obesity (BMI 30.0-34.9)-congratulated her on weight loss.  She will continue with healthy lifestyle. She plans to schedule with a plastic surgeon to have excess skin since weight loss addressed. Will let me know if she needs a referral.

## 2019-04-21 LAB — COMPREHENSIVE METABOLIC PANEL
ALT: 16 IU/L (ref 0–32)
AST: 19 IU/L (ref 0–40)
Albumin/Globulin Ratio: 1.5 (ref 1.2–2.2)
Albumin: 4.3 g/dL (ref 3.8–4.8)
Alkaline Phosphatase: 66 IU/L (ref 39–117)
BUN/Creatinine Ratio: 11 (ref 9–23)
BUN: 7 mg/dL (ref 6–24)
Bilirubin Total: 0.6 mg/dL (ref 0.0–1.2)
CO2: 21 mmol/L (ref 20–29)
Calcium: 9.5 mg/dL (ref 8.7–10.2)
Chloride: 105 mmol/L (ref 96–106)
Creatinine, Ser: 0.62 mg/dL (ref 0.57–1.00)
GFR calc Af Amer: 130 mL/min/{1.73_m2} (ref >59)
GFR calc non Af Amer: 112 mL/min/{1.73_m2} (ref >59)
Globulin, Total: 2.8 g/dL (ref 1.5–4.5)
Glucose: 94 mg/dL (ref 65–99)
Potassium: 4.2 mmol/L (ref 3.5–5.2)
Sodium: 137 mmol/L (ref 134–144)
Total Protein: 7.1 g/dL (ref 6.0–8.5)

## 2019-04-21 LAB — LIPID PANEL
Chol/HDL Ratio: 3.1 ratio (ref 0.0–4.4)
Cholesterol, Total: 149 mg/dL (ref 100–199)
HDL: 48 mg/dL (ref >39)
LDL Chol Calc (NIH): 90 mg/dL (ref 0–99)
Triglycerides: 50 mg/dL (ref 0–149)
VLDL Cholesterol Cal: 11 mg/dL (ref 5–40)

## 2019-04-21 LAB — CBC WITH DIFFERENTIAL/PLATELET
Basophils Absolute: 0 10*3/uL (ref 0.0–0.2)
Basos: 0 %
EOS (ABSOLUTE): 0.1 10*3/uL (ref 0.0–0.4)
Eos: 2 %
Hematocrit: 37.2 % (ref 34.0–46.6)
Hemoglobin: 12 g/dL (ref 11.1–15.9)
Immature Grans (Abs): 0 10*3/uL (ref 0.0–0.1)
Immature Granulocytes: 0 %
Lymphocytes Absolute: 2.1 10*3/uL (ref 0.7–3.1)
Lymphs: 48 %
MCH: 27.1 pg (ref 26.6–33.0)
MCHC: 32.3 g/dL (ref 31.5–35.7)
MCV: 84 fL (ref 79–97)
Monocytes Absolute: 0.3 10*3/uL (ref 0.1–0.9)
Monocytes: 7 %
Neutrophils Absolute: 1.9 10*3/uL (ref 1.4–7.0)
Neutrophils: 43 %
Platelets: 224 10*3/uL (ref 150–450)
RBC: 4.42 x10E6/uL (ref 3.77–5.28)
RDW: 12.6 % (ref 11.7–15.4)
WBC: 4.5 10*3/uL (ref 3.4–10.8)

## 2019-04-21 LAB — TSH: TSH: 0.101 u[IU]/mL — ABNORMAL LOW (ref 0.450–4.500)

## 2019-04-21 LAB — HEMOGLOBIN A1C
Est. average glucose Bld gHb Est-mCnc: 108 mg/dL
Hgb A1c MFr Bld: 5.4 % (ref 4.8–5.6)

## 2019-04-21 LAB — RPR: RPR Ser Ql: NONREACTIVE

## 2019-04-21 LAB — HIV ANTIBODY (ROUTINE TESTING W REFLEX): HIV Screen 4th Generation wRfx: NONREACTIVE

## 2019-04-21 LAB — T4, FREE: Free T4: 1.05 ng/dL (ref 0.82–1.77)

## 2019-04-21 MED FILL — NP THYROID 120 MG TABLET: 120 | 30 days supply | Qty: 30 | Fill #0

## 2019-04-23 LAB — NUSWAB VAGINITIS PLUS (VG+)
Candida albicans, NAA: NEGATIVE
Candida glabrata, NAA: NEGATIVE
Chlamydia trachomatis, NAA: NEGATIVE
Neisseria gonorrhoeae, NAA: NEGATIVE
Trich vag by NAA: NEGATIVE

## 2019-05-19 MED FILL — NP THYROID 120 MG TABLET: 120 | 30 days supply | Qty: 30 | Fill #3

## 2019-05-19 MED FILL — NP THYROID 30 MG TABLET: 30 | 30 days supply | Qty: 30 | Fill #3

## 2019-05-25 MED FILL — TOPIRAMATE 25 MG TAB: 25 | 30 days supply | Qty: 90 | Fill #0

## 2019-06-01 ENCOUNTER — Telehealth: Payer: Self-pay | Admitting: *Deleted

## 2019-06-01 NOTE — Telephone Encounter (Signed)
Ok to refer. I am not sure what she is having done exactly so you may have to ask her so we can put in a diagnosis. Thanks.

## 2019-06-01 NOTE — Telephone Encounter (Signed)
Per PCP patient is ok to see Dr. Lyndee Leo Dillingham 06/29/19 @ 8:30 for a consult for possible skin removal (plastics) on various parts of her body.

## 2019-06-01 NOTE — Telephone Encounter (Signed)
Patient called and has appointment with reconstructive surgeon for consult on 06/29/2019 @ 8:30am her name is Dr.Claire Dillingham. She needs a note for this referral entered into the sytem for her insurance ([employer] [employer]). Is this okay?

## 2019-06-24 MED FILL — NP THYROID 120 MG TABLET: 120 | 30 days supply | Qty: 30 | Fill #4

## 2019-06-24 MED FILL — NP THYROID 30 MG TABLET: 30 | 30 days supply | Qty: 30 | Fill #4

## 2019-06-24 MED FILL — NAPROXEN 500 MG TABS: 500 | 15 days supply | Qty: 30 | Fill #2

## 2019-06-29 ENCOUNTER — Other Ambulatory Visit: Payer: Self-pay

## 2019-06-29 ENCOUNTER — Ambulatory Visit (INDEPENDENT_AMBULATORY_CARE_PROVIDER_SITE_OTHER): Payer: No Typology Code available for payment source | Admitting: Plastic Surgery

## 2019-06-29 ENCOUNTER — Encounter: Payer: Self-pay | Admitting: Plastic Surgery

## 2019-06-29 VITALS — BP 113/76 | HR 66 | Temp 97.3°F | Ht 67.0 in | Wt 212.6 lb

## 2019-06-29 DIAGNOSIS — E65 Localized adiposity: Secondary | ICD-10-CM

## 2019-06-29 NOTE — Progress Notes (Signed)
Referring Provider Avanell Shackleton, NP-C [address] [place],  [place] [address]   CC:  Chief Complaint  Patient presents with  . Advice Only    for excess skin removal on arms      Donna Mccarthy is an 43 y.o. female.  HPI: Patient is here to discuss excess skin in her arms abdomen and medial thighs.  She has lost over 100 pounds through diet and exercise.  Her weight has been stable within 10 pounds or so for the last 6 months.  She does have trouble with rashes in the fold beneath her abdominal pannus.  She has had tried nystatin ointments and powders with limited success.  The excess skin in all 3 areas is interfering with her quality of life and affecting her ability to exercise.  She does not smoke.  She has had no prior abdominal surgeries.  Allergies  Allergen Reactions  . Penicillins Other (See Comments)    Mother said she had a rash  . Toradol [Ketorolac Tromethamine] Other (See Comments)    "felt like whole body was on fire"    Outpatient Encounter Medications as of 06/29/2019  Medication Sig  . levonorgestrel (MIRENA) 20 MCG/24HR IUD 1 each by Intrauterine route once.  . naproxen (NAPROSYN) 500 MG tablet Take 1 tablet by mouth as needed.  . thyroid (ARMOUR THYROID) 120 MG tablet Take 120 mg by mouth daily before breakfast.  . thyroid (ARMOUR) 30 MG tablet Take 30 mg by mouth daily before breakfast.  . topiramate (TOPAMAX) 25 MG tablet TAKE 3 TABLETS (75 MG TOTAL) BY MOUTH AT BEDTIME.  . [DISCONTINUED] meloxicam (MOBIC) 7.5 MG tablet TAKE 2 TABLETS ONCE DAILY AS NEEDED FOR PAIN.   No facility-administered encounter medications on file as of 06/29/2019.      Past Medical History:  Diagnosis Date  . Allergy   . Anemia   . Anxiety   . Arthritis    arthritis knees  . Blood transfusion without reported diagnosis   . GERD (gastroesophageal reflux disease)   . Hypothyroidism   . Obesity (BMI 30-39.9) 01/05/2019    Past Surgical History:  Procedure  Laterality Date  . DILATION AND CURETTAGE OF UTERUS      Family History  Problem Relation Age of Onset  . Hypertension Mother   . Hypertension Father   . Stroke Father        TIA  . Hyperlipidemia Father   . Colon cancer Maternal Aunt   . Diabetes Maternal Grandmother   . 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  I do not palpate any hernias.  She has significant excess skin in the infra and supraumbilical areas.  There is not a  lot of fat left at this point.  There is some skin pigmentation changes in the fold beneath the pannus indicating irritation. Arms: She has significant excess skin in the bilateral upper arms.  There are no scars.  Again there is not a lot of fat left but mainly excess skin.  Both hands are neurovascularly intact. Thighs: She has significant excess skin and some residual fat in the bilateral medial thighs.  There is no surrounding scars.  Assessment/Plan Patient is here to discuss body contouring after her weight loss of over 100 pounds.  Her weight has been stable and I think she is a great candidate for panniculectomy, brachioplasty, and thigh plasty.  Her primary areas of concern are the arms and the belly and I explained to her that I would only do 2 of these areas at a time.  I explained that the arms  would be a cosmetic procedure.  I do think that the abdomen will qualify for insurance panniculectomy.  I explained the details of brachioplasty and panniculectomy with her.  I explained the risk that include bleeding infection, demonstrating structures, persistent contour irregularities, scarring, seroma, need for additional procedures.  I discussed that the supraumbilical area after panniculectomy would be improved but would not be considered a primary focus of an insurance panniculectomy.  I do think that area would be improved with some additional liposuction but that would be a cosmetic add-on.  I explained the expected postoperative course which would include drains for the abdomen but no drains for the arm.  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  She was satisfied all of her questions were answered and will move forward.  [name] 06/29/2019, 9:24 AM

## 2019-07-01 MED FILL — TOPIRAMATE 25 MG TAB: 25 | 30 days supply | Qty: 90 | Fill #1

## 2019-07-13 ENCOUNTER — Other Ambulatory Visit: Payer: Self-pay

## 2019-07-13 DIAGNOSIS — Z20822 Contact with and (suspected) exposure to covid-19: Secondary | ICD-10-CM

## 2019-07-14 LAB — NOVEL CORONAVIRUS, NAA: SARS-CoV-2, NAA: NOT DETECTED

## 2019-07-27 ENCOUNTER — Ambulatory Visit (INDEPENDENT_AMBULATORY_CARE_PROVIDER_SITE_OTHER): Payer: No Typology Code available for payment source | Admitting: Plastic Surgery

## 2019-07-27 ENCOUNTER — Encounter: Payer: Self-pay | Admitting: Plastic Surgery

## 2019-07-27 ENCOUNTER — Other Ambulatory Visit: Payer: Self-pay

## 2019-07-27 VITALS — BP 119/77 | HR 67 | Temp 97.3°F | Ht 67.0 in | Wt 207.4 lb

## 2019-07-27 DIAGNOSIS — E65 Localized adiposity: Secondary | ICD-10-CM | POA: Diagnosis not present

## 2019-07-27 MED FILL — NP THYROID 30 MG TABLET: 30 | 30 days supply | Qty: 30 | Fill #5

## 2019-07-27 MED FILL — NP THYROID 120 MG TABLET: 120 | 30 days supply | Qty: 30 | Fill #5

## 2019-07-27 NOTE — Progress Notes (Signed)
Referring Provider Girtha Rm, NP-C [place],  [place] [address]   CC:  Chief Complaint  Patient presents with  . Pre-op Exam    for panniculectomy      Donna Mccarthy is an 43 y.o. female.  HPI: Patient presents preop for panniculectomy.  Nothing is change with her medical history and all of her symptoms were the same.  She is anxious to proceed with surgery.  Allergies  Allergen Reactions  . Penicillins Other (See Comments)    Mother said she had a rash  . Toradol [Ketorolac Tromethamine] Other (See Comments)    "felt like whole body was on fire"    Outpatient Encounter Medications as of 07/27/2019  Medication Sig  . Ascorbic Acid (VITAMIN C) 100 MG tablet Take 100 mg by mouth daily.  Marland Kitchen levonorgestrel (MIRENA) 20 MCG/24HR IUD 1 each by Intrauterine route once.  . naproxen (NAPROSYN) 500 MG tablet Take 1 tablet by mouth as needed.  Marland Kitchen OVER THE COUNTER MEDICATION Probiotic-Take 1 capsule by mouth daily.  Marland Kitchen thyroid (ARMOUR THYROID) 120 MG tablet Take 120 mg by mouth daily before breakfast.  . thyroid (ARMOUR) 30 MG tablet Take 30 mg by mouth daily before breakfast.  . topiramate (TOPAMAX) 25 MG tablet TAKE 3 TABLETS (75 MG TOTAL) BY MOUTH AT BEDTIME.  . vitamin B-12 (CYANOCOBALAMIN) 50 MCG tablet Take 50 mcg by mouth daily.   No facility-administered encounter medications on file as of 07/27/2019.      Past Medical History:  Diagnosis Date  . Allergy   . Anemia   . Anxiety   . Arthritis    arthritis knees  . Blood transfusion without reported diagnosis   . GERD (gastroesophageal reflux disease)   . Hypothyroidism   . Obesity (BMI 30-39.9) 01/05/2019    Past Surgical History:  Procedure Laterality Date  . DILATION AND CURETTAGE OF UTERUS      Family History  Problem Relation Age of Onset  . Hypertension Mother   . Hypertension Father   . Stroke Father        TIA  . Hyperlipidemia Father   . Colon cancer Maternal Aunt   . Diabetes  Maternal Grandmother   . 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   Works also at [employer] with DME     Review of Systems General: Denies fevers, chills, weight loss CV: Denies chest pain, shortness of breath, palpitations  Physical Exam Vitals with BMI 07/27/2019 06/29/2019 04/20/2019  Height 5\' 7"  5\' 7"  5' 6.5"  Weight 207 lbs 6 oz 212 lbs 10 oz 217 lbs 13 oz  BMI 32.48 60.45 40.98  Systolic 119 147 829  Diastolic 77 76 80  Pulse 67 66 78    General:  No acute distress,  Alert and oriented, Non-Toxic, Normal speech and affect Cardiovascular: Regular rhythm Pulmonary: Unlabored Abdomen: Unchanged  Assessment/Plan Patient presents preop for panniculectomy.  We discussed again the risk of the procedure that include bleeding, infection, damage surrounding structures, thigh numbness, need for additional procedures.  We discussed the risk of blood clots and what we could do postoperatively to prevent them which in her case be primarily early ambulation.  We discussed the need for drains and I suspect she will have 2 postop.  In her we will plan to transpose the umbilicus as I think it would make a significant difference and give her a better result.  I will plan to give her Norco and Zofran for postoperative medic medications.  Donna Mccarthy 07/27/2019, 4:25 PM

## 2019-07-27 NOTE — H&P (View-Only) (Signed)
Referring Provider Girtha Rm, NP-C [place],  [place] [address]   CC:  Chief Complaint  Patient presents with  . Pre-op Exam    for panniculectomy      Donna Mccarthy is an 43 y.o. female.  HPI: Patient presents preop for panniculectomy.  Nothing is change with her medical history and all of her symptoms were the same.  She is anxious to proceed with surgery.  Allergies  Allergen Reactions  . Penicillins Other (See Comments)    Mother said she had a rash  . Toradol [Ketorolac Tromethamine] Other (See Comments)    "felt like whole body was on fire"    Outpatient Encounter Medications as of 07/27/2019  Medication Sig  . Ascorbic Acid (VITAMIN C) 100 MG tablet Take 100 mg by mouth daily.  Marland Kitchen levonorgestrel (MIRENA) 20 MCG/24HR IUD 1 each by Intrauterine route once.  . naproxen (NAPROSYN) 500 MG tablet Take 1 tablet by mouth as needed.  Marland Kitchen OVER THE COUNTER MEDICATION Probiotic-Take 1 capsule by mouth daily.  Marland Kitchen thyroid (ARMOUR THYROID) 120 MG tablet Take 120 mg by mouth daily before breakfast.  . thyroid (ARMOUR) 30 MG tablet Take 30 mg by mouth daily before breakfast.  . topiramate (TOPAMAX) 25 MG tablet TAKE 3 TABLETS (75 MG TOTAL) BY MOUTH AT BEDTIME.  . vitamin B-12 (CYANOCOBALAMIN) 50 MCG tablet Take 50 mcg by mouth daily.   No facility-administered encounter medications on file as of 07/27/2019.      Past Medical History:  Diagnosis Date  . Allergy   . Anemia   . Anxiety   . Arthritis    arthritis knees  . Blood transfusion without reported diagnosis   . GERD (gastroesophageal reflux disease)   . Hypothyroidism   . Obesity (BMI 30-39.9) 01/05/2019    Past Surgical History:  Procedure Laterality Date  . DILATION AND CURETTAGE OF UTERUS      Family History  Problem Relation Age of Onset  . Hypertension Mother   . Hypertension Father   . Stroke Father        TIA  . Hyperlipidemia Father   . Colon cancer Maternal Aunt   . Diabetes  Maternal Grandmother   . 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   Works also at [employer] with DME     Review of Systems General: Denies fevers, chills, weight loss CV: Denies chest pain, shortness of breath, palpitations  Physical Exam Vitals with BMI 07/27/2019 06/29/2019 04/20/2019  Height 5\' 7"  5\' 7"  5' 6.5"  Weight 207 lbs 6 oz 212 lbs 10 oz 217 lbs 13 oz  BMI 32.48 60.45 40.98  Systolic 119 147 829  Diastolic 77 76 80  Pulse 67 66 78    General:  No acute distress,  Alert and oriented, Non-Toxic, Normal speech and affect Cardiovascular: Regular rhythm Pulmonary: Unlabored Abdomen: Unchanged  Assessment/Plan Patient presents preop for panniculectomy.  We discussed again the risk of the procedure that include bleeding, infection, damage surrounding structures, thigh numbness, need for additional procedures.  We discussed the risk of blood clots and what we could do postoperatively to prevent them which in her case be primarily early ambulation.  We discussed the need for drains and I suspect she will have 2 postop.  In her we will plan to transpose the umbilicus as I think it would make a significant difference and give her a better result.  I will plan to give her Norco and Zofran for postoperative medic medications.  Donna Mccarthy 07/27/2019, 4:25 PM

## 2019-07-28 ENCOUNTER — Encounter (HOSPITAL_BASED_OUTPATIENT_CLINIC_OR_DEPARTMENT_OTHER): Payer: Self-pay | Admitting: Plastic Surgery

## 2019-07-28 ENCOUNTER — Other Ambulatory Visit: Payer: Self-pay

## 2019-08-01 ENCOUNTER — Other Ambulatory Visit: Payer: Self-pay

## 2019-08-01 ENCOUNTER — Other Ambulatory Visit
Admission: RE | Admit: 2019-08-01 | Discharge: 2019-08-01 | Disposition: A | Discharge status: Home / Self Care | Payer: No Typology Code available for payment source | Source: Ambulatory Visit | Attending: Plastic Surgery | Admitting: Plastic Surgery

## 2019-08-01 ENCOUNTER — Inpatient Hospital Stay (HOSPITAL_COMMUNITY): Admission: RE | Admit: 2019-08-01 | Payer: No Typology Code available for payment source | Source: Ambulatory Visit

## 2019-08-01 DIAGNOSIS — Z01812 Encounter for preprocedural laboratory examination: Secondary | ICD-10-CM | POA: Diagnosis present

## 2019-08-01 DIAGNOSIS — Z20828 Contact with and (suspected) exposure to other viral communicable diseases: Secondary | ICD-10-CM | POA: Diagnosis not present

## 2019-08-02 LAB — SARS CORONAVIRUS 2 (TAT 6-24 HRS): SARS Coronavirus 2: NEGATIVE

## 2019-08-04 ENCOUNTER — Ambulatory Visit (HOSPITAL_BASED_OUTPATIENT_CLINIC_OR_DEPARTMENT_OTHER)
Admission: RE | Admit: 2019-08-04 | Discharge: 2019-08-04 | Disposition: A | Discharge status: Home / Self Care | Payer: No Typology Code available for payment source | Attending: Plastic Surgery | Admitting: Plastic Surgery

## 2019-08-04 ENCOUNTER — Encounter (HOSPITAL_BASED_OUTPATIENT_CLINIC_OR_DEPARTMENT_OTHER)
Admission: RE | Disposition: A | Discharge status: Home / Self Care | Payer: Self-pay | Source: Home / Self Care | Attending: Plastic Surgery

## 2019-08-04 ENCOUNTER — Ambulatory Visit (HOSPITAL_BASED_OUTPATIENT_CLINIC_OR_DEPARTMENT_OTHER): Payer: No Typology Code available for payment source | Admitting: Anesthesiology

## 2019-08-04 ENCOUNTER — Encounter (HOSPITAL_BASED_OUTPATIENT_CLINIC_OR_DEPARTMENT_OTHER): Payer: Self-pay | Admitting: Plastic Surgery

## 2019-08-04 ENCOUNTER — Other Ambulatory Visit: Payer: Self-pay

## 2019-08-04 DIAGNOSIS — M793 Panniculitis, unspecified: Secondary | ICD-10-CM

## 2019-08-04 DIAGNOSIS — E65 Localized adiposity: Secondary | ICD-10-CM

## 2019-08-04 DIAGNOSIS — M1990 Unspecified osteoarthritis, unspecified site: Secondary | ICD-10-CM | POA: Insufficient documentation

## 2019-08-04 DIAGNOSIS — F419 Anxiety disorder, unspecified: Secondary | ICD-10-CM | POA: Insufficient documentation

## 2019-08-04 DIAGNOSIS — E039 Hypothyroidism, unspecified: Secondary | ICD-10-CM | POA: Diagnosis not present

## 2019-08-04 DIAGNOSIS — K219 Gastro-esophageal reflux disease without esophagitis: Secondary | ICD-10-CM | POA: Diagnosis not present

## 2019-08-04 DIAGNOSIS — E669 Obesity, unspecified: Secondary | ICD-10-CM | POA: Diagnosis not present

## 2019-08-04 DIAGNOSIS — R519 Headache, unspecified: Secondary | ICD-10-CM | POA: Insufficient documentation

## 2019-08-04 DIAGNOSIS — Z6831 Body mass index (BMI) 31.0-31.9, adult: Secondary | ICD-10-CM | POA: Insufficient documentation

## 2019-08-04 HISTORY — DX: Myoneural disorder, unspecified: G70.9

## 2019-08-04 HISTORY — PX: PANNICULECTOMY: SHX5360

## 2019-08-04 LAB — POCT PREGNANCY, URINE: Preg Test, Ur: NEGATIVE

## 2019-08-04 SURGERY — PANNICULECTOMY
Anesthesia: General | Site: Abdomen

## 2019-08-04 MED ORDER — FENTANYL CITRATE (PF) 100 MCG/2ML IJ SOLN
INTRAMUSCULAR | Status: AC
  Filled 2019-08-04: qty 2

## 2019-08-04 MED ORDER — HYDROCODONE-ACETAMINOPHEN 5-325 MG PO TABS: 1.0000 | ORAL_TABLET | Freq: Four times a day (QID) | ORAL | 0 refills | Status: DC | PRN

## 2019-08-04 MED ORDER — LACTATED RINGERS IR SOLN
Status: DC | PRN
  Administered 2019-08-04: 550 mL

## 2019-08-04 MED ORDER — DEXAMETHASONE SODIUM PHOSPHATE 10 MG/ML IJ SOLN
INTRAMUSCULAR | Status: DC | PRN
  Administered 2019-08-04: 10 mg via INTRAVENOUS

## 2019-08-04 MED ORDER — OXYCODONE HCL 5 MG PO TABS
5.0000 mg | ORAL_TABLET | Freq: Once | ORAL | Status: AC
  Administered 2019-08-04: 5 mg via ORAL

## 2019-08-04 MED ORDER — LIDOCAINE 2% (20 MG/ML) 5 ML SYRINGE
INTRAMUSCULAR | Status: DC | PRN
  Administered 2019-08-04: 60 mg via INTRAVENOUS

## 2019-08-04 MED ORDER — PROPOFOL 10 MG/ML IV BOLUS
INTRAVENOUS | Status: DC | PRN
  Administered 2019-08-04: 160 mg via INTRAVENOUS

## 2019-08-04 MED ORDER — BUPIVACAINE HCL (PF) 0.25 % IJ SOLN
INTRAMUSCULAR | Status: DC | PRN
  Administered 2019-08-04: 30 mL

## 2019-08-04 MED ORDER — MIDAZOLAM HCL 2 MG/2ML IJ SOLN
INTRAMUSCULAR | Status: AC
  Filled 2019-08-04: qty 2

## 2019-08-04 MED ORDER — PROMETHAZINE HCL 25 MG/ML IJ SOLN
6.2500 mg | INTRAMUSCULAR | Status: DC | PRN
  Administered 2019-08-04: 12.5 mg via INTRAVENOUS

## 2019-08-04 MED ORDER — VANCOMYCIN HCL IN DEXTROSE 1-5 GM/200ML-% IV SOLN
INTRAVENOUS | Status: AC
  Filled 2019-08-04: qty 200

## 2019-08-04 MED ORDER — ONDANSETRON HCL 4 MG PO TABS: 4.0000 mg | ORAL_TABLET | Freq: Three times a day (TID) | ORAL | 0 refills | Status: DC | PRN

## 2019-08-04 MED ORDER — ONDANSETRON HCL 4 MG/2ML IJ SOLN
INTRAMUSCULAR | Status: AC
  Filled 2019-08-04: qty 2

## 2019-08-04 MED ORDER — DEXAMETHASONE SODIUM PHOSPHATE 10 MG/ML IJ SOLN
INTRAMUSCULAR | Status: AC
  Filled 2019-08-04: qty 1

## 2019-08-04 MED ORDER — PROMETHAZINE HCL 25 MG/ML IJ SOLN
INTRAMUSCULAR | Status: AC
  Filled 2019-08-04: qty 1

## 2019-08-04 MED ORDER — ACETAMINOPHEN 10 MG/ML IV SOLN
1000.0000 mg | Freq: Once | INTRAVENOUS | Status: AC
  Administered 2019-08-04: 1000 mg via INTRAVENOUS

## 2019-08-04 MED ORDER — FENTANYL CITRATE (PF) 100 MCG/2ML IJ SOLN
INTRAMUSCULAR | Status: DC | PRN
  Administered 2019-08-04 (×8): 25 ug via INTRAVENOUS
  Administered 2019-08-04: 50 ug via INTRAVENOUS
  Administered 2019-08-04 (×2): 25 ug via INTRAVENOUS

## 2019-08-04 MED ORDER — OXYCODONE HCL 5 MG PO TABS
ORAL_TABLET | ORAL | Status: AC
  Filled 2019-08-04: qty 1

## 2019-08-04 MED ORDER — ACETAMINOPHEN 10 MG/ML IV SOLN
INTRAVENOUS | Status: AC
  Filled 2019-08-04: qty 100

## 2019-08-04 MED ORDER — MIDAZOLAM HCL 5 MG/5ML IJ SOLN
INTRAMUSCULAR | Status: DC | PRN
  Administered 2019-08-04: 2 mg via INTRAVENOUS

## 2019-08-04 MED ORDER — LIDOCAINE HCL (PF) 1 % IJ SOLN
INTRAMUSCULAR | Status: AC
  Filled 2019-08-04: qty 30

## 2019-08-04 MED ORDER — VANCOMYCIN HCL IN DEXTROSE 1-5 GM/200ML-% IV SOLN
1000.0000 mg | INTRAVENOUS | Status: AC
  Administered 2019-08-04: 12:00:00 1000 mg via INTRAVENOUS

## 2019-08-04 MED ORDER — EPINEPHRINE PF 1 MG/ML IJ SOLN
INTRAMUSCULAR | Status: DC | PRN
  Administered 2019-08-04: 1 mg

## 2019-08-04 MED ORDER — LIDOCAINE 2% (20 MG/ML) 5 ML SYRINGE
INTRAMUSCULAR | Status: AC
  Filled 2019-08-04: qty 5

## 2019-08-04 MED ORDER — ONDANSETRON HCL 4 MG/2ML IJ SOLN
INTRAMUSCULAR | Status: DC | PRN
  Administered 2019-08-04 (×2): 4 mg via INTRAVENOUS

## 2019-08-04 MED ORDER — LIDOCAINE-EPINEPHRINE 1 %-1:100000 IJ SOLN
INTRAMUSCULAR | Status: AC
  Filled 2019-08-04: qty 1

## 2019-08-04 MED ORDER — FENTANYL CITRATE (PF) 100 MCG/2ML IJ SOLN
25.0000 ug | INTRAMUSCULAR | Status: DC | PRN
  Administered 2019-08-04 (×3): 50 ug via INTRAVENOUS

## 2019-08-04 MED ORDER — LACTATED RINGERS IV SOLN: INTRAVENOUS | Status: DC

## 2019-08-04 SURGICAL SUPPLY — 78 items
ADH SKN CLS APL DERMABOND .7 (GAUZE/BANDAGES/DRESSINGS) ×2
APL PRP STRL LF DISP 70% ISPRP (MISCELLANEOUS) ×1
APPLIER CLIP 9.375 MED OPEN (MISCELLANEOUS) ×2
APR CLP MED 9.3 20 MLT OPN (MISCELLANEOUS) ×1
BAG DECANTER FOR FLEXI CONT (MISCELLANEOUS) ×1 IMPLANT
BINDER ABDOMINAL 12 SM 30-45 (SOFTGOODS) ×2 IMPLANT
BLADE CLIPPER SURG (BLADE) IMPLANT
BLADE SURG 10 STRL SS (BLADE) ×3 IMPLANT
BLADE SURG 11 STRL SS (BLADE) ×2 IMPLANT
BLADE SURG 15 STRL LF DISP TIS (BLADE) IMPLANT
BLADE SURG 15 STRL SS (BLADE) ×2
CANISTER SUCT 1200ML W/VALVE (MISCELLANEOUS) ×2 IMPLANT
CHLORAPREP W/TINT 26 (MISCELLANEOUS) ×2 IMPLANT
CLIP APPLIE 9.375 MED OPEN (MISCELLANEOUS) ×1 IMPLANT
COVER BACK TABLE REUSABLE LG (DRAPES) ×2 IMPLANT
COVER MAYO STAND REUSABLE (DRAPES) ×2 IMPLANT
COVER SURGICAL LIGHT HANDLE (MISCELLANEOUS) ×1 IMPLANT
COVER WAND RF STERILE (DRAPES) IMPLANT
DERMABOND ADVANCED (GAUZE/BANDAGES/DRESSINGS) ×2
DERMABOND ADVANCED .7 DNX12 (GAUZE/BANDAGES/DRESSINGS) ×2 IMPLANT
DRAIN CHANNEL 15F RND FF W/TCR (WOUND CARE) ×4 IMPLANT
DRAIN CHANNEL 19F RND (DRAIN) IMPLANT
DRAPE HALF SHEET 70X43 (DRAPES) ×2 IMPLANT
DRAPE TOP ARMCOVERS (MISCELLANEOUS) ×2 IMPLANT
DRAPE U-SHAPE 76X120 STRL (DRAPES) ×2 IMPLANT
DRAPE UTILITY XL STRL (DRAPES) ×2 IMPLANT
DRSG PAD ABDOMINAL 8X10 ST (GAUZE/BANDAGES/DRESSINGS) ×6 IMPLANT
ELECT BLADE 4.0 EZ CLEAN MEGAD (MISCELLANEOUS)
ELECT COATED BLADE 2.86 ST (ELECTRODE) IMPLANT
ELECT REM PT RETURN 9FT ADLT (ELECTROSURGICAL) ×2
ELECTRODE BLDE 4.0 EZ CLN MEGD (MISCELLANEOUS) IMPLANT
ELECTRODE REM PT RTRN 9FT ADLT (ELECTROSURGICAL) ×1 IMPLANT
EVACUATOR SILICONE 100CC (DRAIN) ×4 IMPLANT
GAUZE SPONGE 4X4 12PLY STRL (GAUZE/BANDAGES/DRESSINGS) IMPLANT
GAUZE XEROFORM 1X8 LF (GAUZE/BANDAGES/DRESSINGS) ×2 IMPLANT
GLOVE BIOGEL M STRL SZ7.5 (GLOVE) ×2 IMPLANT
GLOVE BIOGEL PI IND STRL 8 (GLOVE) ×1 IMPLANT
GLOVE BIOGEL PI INDICATOR 8 (GLOVE) ×1
GOWN STRL REUS W/ TWL LRG LVL3 (GOWN DISPOSABLE) ×2 IMPLANT
GOWN STRL REUS W/TWL LRG LVL3 (GOWN DISPOSABLE) ×4
NDL FILTER BLUNT 18X1 1/2 (NEEDLE) IMPLANT
NDL HYPO 25X1 1.5 SAFETY (NEEDLE) IMPLANT
NDL SPNL 18GX3.5 QUINCKE PK (NEEDLE) IMPLANT
NEEDLE FILTER BLUNT 18X 1/2SAF (NEEDLE) ×1
NEEDLE FILTER BLUNT 18X1 1/2 (NEEDLE) ×1 IMPLANT
NEEDLE HYPO 25X1 1.5 SAFETY (NEEDLE) IMPLANT
NEEDLE SPNL 18GX3.5 QUINCKE PK (NEEDLE) ×2 IMPLANT
NS IRRIG 1000ML POUR BTL (IV SOLUTION) ×2 IMPLANT
PACK BASIN DAY SURGERY FS (CUSTOM PROCEDURE TRAY) ×2 IMPLANT
PENCIL SMOKE EVACUATOR (MISCELLANEOUS) ×2 IMPLANT
PIN SAFETY STERILE (MISCELLANEOUS) ×2 IMPLANT
SLEEVE SCD COMPRESS KNEE MED (MISCELLANEOUS) ×2 IMPLANT
SPONGE LAP 18X18 RF (DISPOSABLE) ×4 IMPLANT
STAPLER INSORB 30 2030 C-SECTI (MISCELLANEOUS) ×2 IMPLANT
STAPLER VISISTAT 35W (STAPLE) ×2 IMPLANT
STRIP SUTURE WOUND CLOSURE 1/2 (MISCELLANEOUS) ×3 IMPLANT
SUT ETHILON 2 0 FS 18 (SUTURE) ×4 IMPLANT
SUT MNCRL AB 4-0 PS2 18 (SUTURE) ×4 IMPLANT
SUT MON AB 3-0 SH 27 (SUTURE) ×2
SUT MON AB 3-0 SH27 (SUTURE) IMPLANT
SUT PDS AB 0 CT 36 (SUTURE) ×4 IMPLANT
SUT PDS AB 2-0 CT2 27 (SUTURE) IMPLANT
SUT PLAIN 5 0 P 3 18 (SUTURE) ×1 IMPLANT
SUT VIC AB 2-0 CT1 27 (SUTURE) ×10
SUT VIC AB 2-0 CT1 TAPERPNT 27 (SUTURE) IMPLANT
SUT VICRYL 4-0 PS2 18IN ABS (SUTURE) ×2 IMPLANT
SUT VLOC 180 0 24IN GS25 (SUTURE) ×1 IMPLANT
SUT VLOC 90 P-14 23 (SUTURE) ×3 IMPLANT
SYR 50ML LL SCALE MARK (SYRINGE) ×2 IMPLANT
SYR BULB IRRIGATION 50ML (SYRINGE) ×2 IMPLANT
SYR CONTROL 10ML LL (SYRINGE) IMPLANT
TOWEL GREEN STERILE FF (TOWEL DISPOSABLE) ×4 IMPLANT
TRAY FOL W/BAG SLVR 16FR STRL (SET/KITS/TRAYS/PACK) IMPLANT
TRAY FOLEY W/BAG SLVR 14FR LF (SET/KITS/TRAYS/PACK) IMPLANT
TRAY FOLEY W/BAG SLVR 16FR LF (SET/KITS/TRAYS/PACK)
TUBE CONNECTING 20X1/4 (TUBING) ×2 IMPLANT
UNDERPAD 30X36 HEAVY ABSORB (UNDERPADS AND DIAPERS) ×4 IMPLANT
YANKAUER SUCT BULB TIP NO VENT (SUCTIONS) ×2 IMPLANT

## 2019-08-04 NOTE — Transfer of Care (Signed)
Immediate Anesthesia Transfer of Care Note  Patient: Donna Mccarthy  Procedure(s) Performed: PANNICULECTOMY (N/A Abdomen)  Patient Location: PACU  Anesthesia Type:General  Level of Consciousness: awake, alert  and oriented  Airway & Oxygen Therapy: Patient Spontanous Breathing and Patient connected to face mask oxygen  Post-op Assessment: Report given to RN and Post -op Vital signs reviewed and stable  Post vital signs: Reviewed and stable  Last Vitals:  Vitals Value Taken Time  BP 114/81   Temp    Pulse 98   Resp 12   SpO2 100%     Last Pain:  Vitals:   08/04/19 1146  TempSrc: Oral  PainSc: 0-No pain      Patients Stated Pain Goal: 4 (48/88/91 6945)  Complications: No apparent anesthesia complications

## 2019-08-04 NOTE — Anesthesia Preprocedure Evaluation (Addendum)
Anesthesia Evaluation  Patient identified by MRN, date of birth, ID band Patient awake    Reviewed: Allergy & Precautions, NPO status , Patient's Chart, lab work & pertinent test results  History of Anesthesia Complications Negative for: history of anesthetic complications  Airway Mallampati: I  TM Distance: >3 FB Neck ROM: Full    Dental  (+) Dental Advisory Given, Teeth Intact, Chipped,    Pulmonary neg pulmonary ROS,    Pulmonary exam normal        Cardiovascular negative cardio ROS Normal cardiovascular exam     Neuro/Psych  Headaches, PSYCHIATRIC DISORDERS Anxiety    GI/Hepatic Neg liver ROS, GERD  Controlled,  Endo/Other  Hypothyroidism  Obesity   Renal/GU negative Renal ROS     Musculoskeletal  (+) Arthritis ,   Abdominal   Peds  Hematology negative hematology ROS (+)   Anesthesia Other Findings Covid neg 12/14  Reproductive/Obstetrics                            Anesthesia Physical Anesthesia Plan  ASA: II  Anesthesia Plan: General   Post-op Pain Management:    Induction: Intravenous  PONV Risk Score and Plan: 4 or greater and Treatment may vary due to age or medical condition, Ondansetron, Midazolam and Dexamethasone  Airway Management Planned: LMA  Additional Equipment: None  Intra-op Plan:   Post-operative Plan: Extubation in OR  Informed Consent: I have reviewed the patients History and Physical, chart, labs and discussed the procedure including the risks, benefits and alternatives for the proposed anesthesia with the patient or authorized representative who has indicated his/her understanding and acceptance.     Dental advisory given  Plan Discussed with: CRNA and Anesthesiologist  Anesthesia Plan Comments:        Anesthesia Quick Evaluation

## 2019-08-04 NOTE — Addendum Note (Signed)
Addendum  created 08/04/19 1710 by Audry Pili, MD   Order list changed

## 2019-08-04 NOTE — Brief Op Note (Signed)
08/04/2019  2:53 PM  PATIENT:  Donna Mccarthy  43 y.o. female  PRE-OPERATIVE DIAGNOSIS:  panniculitis  POST-OPERATIVE DIAGNOSIS:  panniculitis  PROCEDURE:  Procedure(s) with comments: PANNICULECTOMY (N/A) - 2 hours only, please  SURGEON:  Surgeon(s) and Role:    * Akansha Wyche, Steffanie Dunn, MD - Primary  PHYSICIAN ASSISTANT: None  ASSISTANTS: Bonita   ANESTHESIA:   general  EBL:  100 mL   BLOOD ADMINISTERED:none  DRAINS: (2) Jackson-Pratt drain(s) with closed bulb suction in the abdomen   LOCAL MEDICATIONS USED:  MARCAINE     SPECIMEN:  Source of Specimen:  Pannus  DISPOSITION OF SPECIMEN:  PATHOLOGY  COUNTS:  YES  TOURNIQUET:  * No tourniquets in log *  DICTATION: .Dragon Dictation  PLAN OF CARE: Discharge to home after PACU  PATIENT DISPOSITION:  PACU - hemodynamically stable.   Delay start of Pharmacological VTE agent (>24hrs) due to surgical blood loss or risk of bleeding: not applicable

## 2019-08-04 NOTE — Discharge Instructions (Signed)
No Tylenol until 11:15pm   Activity No heavy activities Can shower starting Saturday  Diet: Regular  Wound Care: Keep dressing clean.  Can cover incision with gauze or ABD pads for drainage. Wear abdominal binder as much as possible  Special Instructions: Empty, recharge, & record drainage from drains 2-3 times a day, or as needed. Call doctor if any unusual problems occur such as pain, excessive bleeding, unrelieved nausea/vomiting, fever &/or chills  When lying down, keep head and back elevated in reclining position to avoid tension on incision Try not to lie completely flat as this will pull on your incision  Follow-up appointment: Scheduled for next week.    Post Anesthesia Home Care Instructions  Activity: Get plenty of rest for the remainder of the day. A responsible individual must stay with you for 24 hours following the procedure.  For the next 24 hours, DO NOT: -Drive a car -Paediatric nurse -Drink alcoholic beverages -Take any medication unless instructed by your physician -Make any legal decisions or sign important papers.  Meals: Start with liquid foods such as gelatin or soup. Progress to regular foods as tolerated. Avoid greasy, spicy, heavy foods. If nausea and/or vomiting occur, drink only clear liquids until the nausea and/or vomiting subsides. Call your physician if vomiting continues.  Special Instructions/Symptoms: Your throat may feel dry or sore from the anesthesia or the breathing tube placed in your throat during surgery. If this causes discomfort, gargle with warm salt water. The discomfort should disappear within 24 hours.  If you had a scopolamine patch placed behind your ear for the management of post- operative nausea and/or vomiting:  1. The medication in the patch is effective for 72 hours, after which it should be removed.  Wrap patch in a tissue and discard in the trash. Wash hands thoroughly with soap and water. 2. You may remove the  patch earlier than 72 hours if you experience unpleasant side effects which may include dry mouth, dizziness or visual disturbances. 3. Avoid touching the patch. Wash your hands with soap and water after contact with the patch.      About my Jackson-Pratt Bulb Drain  What is a Jackson-Pratt bulb? A Jackson-Pratt is a soft, round device used to collect drainage. It is connected to a long, thin drainage catheter, which is held in place by one or two small stiches near your surgical incision site. When the bulb is squeezed, it forms a vacuum, forcing the drainage to empty into the bulb.  Emptying the Jackson-Pratt bulb- To empty the bulb: 1. Release the plug on the top of the bulb. 2. Pour the bulb's contents into a measuring container which your nurse will provide. 3. Record the time emptied and amount of drainage. Empty the drain(s) as often as your     doctor or nurse recommends.  Date                  Time                    Amount (Drain 1)                 Amount (Drain 2)  _____________________________________________________________________  _____________________________________________________________________  _____________________________________________________________________  _____________________________________________________________________  _____________________________________________________________________  _____________________________________________________________________  _____________________________________________________________________  _____________________________________________________________________  Squeezing the Jackson-Pratt Bulb- To squeeze the bulb: 1. Make sure the plug at the top of the bulb is open. 2. Squeeze the bulb tightly in your fist. You will hear air squeezing from the bulb. 3.  Replace the plug while the bulb is squeezed. 4. Use a safety pin to attach the bulb to your clothing. This will keep the catheter from     pulling at the  bulb insertion site.  When to call your doctor- Call your doctor if:  Drain site becomes red, swollen or hot.  You have a fever greater than 101 degrees F.  There is oozing at the drain site.  Drain falls out (apply a guaze bandage over the drain hole and secure it with tape).  Drainage increases daily not related to activity patterns. (You will usually have more drainage when you are active than when you are resting.)  Drainage has a bad odor.

## 2019-08-04 NOTE — Anesthesia Procedure Notes (Signed)
Procedure Name: LMA Insertion Date/Time: 08/04/2019 12:31 PM Performed by: Genelle Bal, CRNA Pre-anesthesia Checklist: Patient identified, Emergency Drugs available, Suction available and Patient being monitored Patient Re-evaluated:Patient Re-evaluated prior to induction Oxygen Delivery Method: Circle system utilized Preoxygenation: Pre-oxygenation with 100% oxygen Induction Type: IV induction Ventilation: Mask ventilation without difficulty LMA: LMA inserted LMA Size: 4.0 Number of attempts: 1 Airway Equipment and Method: Bite block Placement Confirmation: positive ETCO2 Tube secured with: Tape Dental Injury: Teeth and Oropharynx as per pre-operative assessment

## 2019-08-04 NOTE — Op Note (Signed)
Operative Note   DATE OF OPERATION: 08/04/2019  SURGICAL DEPARTMENT: Plastic Surgery  PREOPERATIVE DIAGNOSES:  Abdominal Pannus  POSTOPERATIVE DIAGNOSES:  same  PROCEDURE: Panniculectomy  SURGEON: Talmadge Coventry, MD  ASSISTANTDoroteo Bradford  ANESTHESIA:  General.   COMPLICATIONS: None.   INDICATIONS FOR PROCEDURE:  The patient, Bettey Muraoka is a 43 y.o. female born on 17-Jan-1976, is here for treatment of symptomatic abdominal pannus. MRN: [id number]  CONSENT:  Informed consent was obtained directly from the patient. Risks, benefits and alternatives were fully discussed. Specific risks including but not limited to bleeding, infection, hematoma, seroma, scarring, pain, contracture, asymmetry, wound healing problems, and need for further surgery were all discussed. The patient did have an ample opportunity to have questions answered to satisfaction.   DESCRIPTION OF PROCEDURE:  The patient was taken to the operating room. SCDs were placed and vancomycin antibiotics were given.  General anesthesia was administered.  The patient's operative site was prepped and draped in a sterile fashion. A time out was performed and all information was confirmed to be correct.  I started by marking the inferior incision starting 8 cm superior to the vulvar commissure and extending this laterally.  I then infiltrated the area to be excised with tumescent solution.  The inferior incision was made with a 10 blade and dissected down to the fascia with cautery.  The pannus was then elevated superiorly.  The umbilicus was then separated and incised with a 15 blade and this was taken down to the fascia leaving a cuff of subcutaneous tissue to aid in perfusion.  Undermining was then continued superior to the umbilicus centrally but not laterally.  Hemostasis was obtained with cautery.  33 [place] JP drains were brought out each side and secured with a 2-0 nylon suture.  Patient was then flexed at the hip slightly  and the superior incision was planned.  Pannus was then removed from each side with a 10 blade and cautery.  Total weight was 1137 g from the left side and 974 g from the right side.  The wound was then stapled closed to ensure appropriate alignment.  I then planned where the umbilicus was going to be transposed and brought out and a U-shaped incision was made and hemostat used to dissect through the abdominal flap.  The umbilicus was then tagged with a 2-0 nylon suture and brought out through that incision.  Closure was then done with deep interrupted 2-0 Vicryl sutures for Scarpa's layer and in sorb stapler, and running 3-0 V-Loc barbed suture.  The umbilicus was secured with interrupted buried 4-0 Monocryl sutures and a running 5-0 fast gut suture.  This gave her great on table result.  The wound was dressed with Steri-Strips soft gauze and abdominal binder.  The patient tolerated the procedure well.  There were no complications. The patient was allowed to wake from anesthesia, extubated and taken to the recovery room in satisfactory condition.

## 2019-08-04 NOTE — Anesthesia Postprocedure Evaluation (Signed)
Anesthesia Post Note  Patient: Donna Mccarthy  Procedure(s) Performed: PANNICULECTOMY (N/A Abdomen)     Patient location during evaluation: PACU Anesthesia Type: General Level of consciousness: awake and alert Pain management: pain level controlled Vital Signs Assessment: post-procedure vital signs reviewed and stable Respiratory status: spontaneous breathing, nonlabored ventilation and respiratory function stable Cardiovascular status: blood pressure returned to baseline and stable Postop Assessment: no apparent nausea or vomiting Anesthetic complications: no    Last Vitals:  Vitals:   08/04/19 1615 08/04/19 1630  BP: 121/64 116/90  Pulse: 66 76  Resp: 18 17  Temp:    SpO2: 100% 100%                  Audry Pili

## 2019-08-04 NOTE — Interval H&P Note (Signed)
History and Physical Interval Note:  08/04/2019 12:13 PM  Donna Mccarthy  has presented today for surgery, with the diagnosis of panniculitis.  The various methods of treatment have been discussed with the patient and family. After consideration of risks, benefits and other options for treatment, the patient has consented to  Procedure(s) with comments: PANNICULECTOMY (N/A) - 2 hours only, please as a surgical intervention.  The patient's history has been reviewed, patient examined, no change in status, stable for surgery.  I have reviewed the patient's chart and labs.  Questions were answered to the patient's satisfaction.     [name]

## 2019-08-05 ENCOUNTER — Encounter: Payer: Self-pay | Admitting: *Deleted

## 2019-08-08 ENCOUNTER — Telehealth: Payer: Self-pay

## 2019-08-08 LAB — SURGICAL PATHOLOGY

## 2019-08-08 NOTE — Telephone Encounter (Signed)
Patient reports skin irritation where the post op brace is rubbing against her skin. She has applied aloe vera lotion to the area and would like to know if this is ok to do. She has not applied any lotion to the incision site. There is no redness in that area.

## 2019-08-09 NOTE — Telephone Encounter (Signed)
Called and spoke with the patient regarding the message below.  She stated that she used the aloe vera and it helped.  Informed the patient that I spoke with [facility] and she stated that she can wear a T-shirt under the binder, and make sure she don't get anything near the incision.  Patient verbalized understanding and agreed.  She stated that she was not getting it near the incision.  She also stated that she is having a hard time with the binder not staying in place, and she wanted to know if she will be getting a new binder at her appointment on tomorrow.  Informed the patient that we do not carry the binders here in the office, but she can check with a medical supplies store or check on [place].  Also informed the patient that she can speak with the provider more about this at her appointment on tomorrow.  Patient verbalized understanding and agreed.//AB/CMA

## 2019-08-10 ENCOUNTER — Encounter: Payer: No Typology Code available for payment source | Admitting: Plastic Surgery

## 2019-08-10 ENCOUNTER — Encounter: Payer: Self-pay | Admitting: Surgical

## 2019-08-10 ENCOUNTER — Ambulatory Visit (INDEPENDENT_AMBULATORY_CARE_PROVIDER_SITE_OTHER): Payer: No Typology Code available for payment source | Admitting: Surgical

## 2019-08-10 ENCOUNTER — Other Ambulatory Visit: Payer: Self-pay

## 2019-08-10 VITALS — BP 100/64 | HR 64 | Temp 97.5°F | Ht 67.0 in | Wt 240.0 lb

## 2019-08-10 DIAGNOSIS — E65 Localized adiposity: Secondary | ICD-10-CM

## 2019-08-10 NOTE — Progress Notes (Signed)
Subjective:     Patient ID: Donna Mccarthy, female    DOB: January 22, 1976, 43 y.o.   MRN: [id number]  Chief Complaint  Patient presents with  . Post-op Follow-up    panniculetctomy SX: 08/04/2019    HPI: The patient is a 43 y.o. female here for follow-up after panniculectomy on 08/04/2019 with Dr. Silverio Lay pace.  Overall she is doing really well.  She reports that the binder has been causing some irritation on her left side. She has been wearing it 24/7 and sleeping in a somewhat reclined position to decrease pressure on the incision.  Combined JP drain output has been approximately 60 cc of serosanguineous fluid every 24 hours.  Left JP drain is draining more than the right.  She initially had some leg swelling (bilateral) postoperatively but this has resolved.  She was wearing compression socks which helped a lot.  [place]  She also reports some constipation, last bowel movement was Sunday, she has been using a stool softener.  She reports some tightness of the midline abdominal area, worse with bowel movements and urination, but overall she reports that is feeling okay. Her incisions are healing really well.  No sign of infection, seroma, hematoma.  Incisions are C/D/I, no dehiscence noted.  No fever, chills, nausea, vomiting.  Review of Systems  Constitutional: Positive for activity change. Negative for appetite change, chills, diaphoresis, fatigue and fever.  Respiratory: Negative for chest tightness and shortness of breath.   Cardiovascular: Positive for leg swelling. Negative for chest pain.  Gastrointestinal: Positive for abdominal pain and constipation. Negative for diarrhea, nausea and vomiting.  Musculoskeletal: Negative.   Skin: Negative.      Objective:   Vital Signs BP 100/64 (BP Location: Left Arm, Patient Position: Sitting, Cuff Size: Large)   Pulse 64   Temp (!) 97.5 F (36.4 C)   Ht 5\' 7"  (1.702 m)    Wt 240 lb (108.9 kg)   LMP 07/19/2019 (Exact Date)   SpO2 100%   BMI 37.59 kg/m  Vital Signs and Nursing Note Reviewed Chaperone present Physical Exam  Constitutional: She is oriented to person, place, and time and well-developed, well-nourished, and in no distress.  HENT:  Head: Normocephalic and atraumatic.  Cardiovascular: Normal rate.  Pulmonary/Chest: Effort normal.  Abdominal: There is abdominal tenderness.    Musculoskeletal:        General: Normal range of motion.  Neurological: She is alert and oriented to person, place, and time. Gait normal.  Skin: Skin is warm and dry. No rash noted. She is not diaphoretic. No erythema. No pallor.  Psychiatric: Mood and affect normal.      Assessment/Plan:     ICD-10-CM   1. Abdominal pannus  E65     Donna Mccarthy is doing really well, she reports she is really happy with her progress.  Her incisions are healing well, there is no sign of infection, seroma, hematoma at this time.  Continue to wear abdominal binder 24/7 for a total of 6 weeks postop.  She can wear spanks if she needs to wash the abdominal binder.  She can also wear the spanks with the abdominal binder.  Follow-up in 1 week for possible removal of left JP drain.  Right JP drain removed and Vaseline gauze placed over the area.  Call with any questions or concerns, follow-up in 1 week.   Donna Rhine Detta Mellin, PA-C 08/10/2019, 10:41 AM

## 2019-08-11 ENCOUNTER — Encounter: Payer: No Typology Code available for payment source | Admitting: Plastic Surgery

## 2019-08-15 MED FILL — TOPIRAMATE 25 MG TAB: 25 | 30 days supply | Qty: 90 | Fill #2

## 2019-08-18 ENCOUNTER — Ambulatory Visit (INDEPENDENT_AMBULATORY_CARE_PROVIDER_SITE_OTHER): Payer: No Typology Code available for payment source | Admitting: Surgical

## 2019-08-18 ENCOUNTER — Other Ambulatory Visit: Payer: Self-pay

## 2019-08-18 ENCOUNTER — Encounter: Payer: Self-pay | Admitting: Surgical

## 2019-08-18 ENCOUNTER — Telehealth: Payer: Self-pay | Admitting: Plastic Surgery

## 2019-08-18 VITALS — BP 111/76 | HR 63 | Temp 97.8°F | Ht 66.0 in | Wt 203.8 lb

## 2019-08-18 DIAGNOSIS — E65 Localized adiposity: Secondary | ICD-10-CM

## 2019-08-18 NOTE — Telephone Encounter (Signed)
Pt would like to know if she can start using bio oil or scar guard or should she wait? Please call back on mobile to advise.

## 2019-08-18 NOTE — Telephone Encounter (Signed)
She should wait a few more weeks.  Would like the incision to be completely healed prior to using any scar cream.

## 2019-08-18 NOTE — Telephone Encounter (Signed)
Returned patients call, LMVM she should wait a few more weeks and let the incision to be completely healed prior to using any scar cream.

## 2019-08-18 NOTE — Progress Notes (Signed)
   Subjective:     Patient ID: Donna Mccarthy, female    DOB: 11/25/1975, 43 y.o.   MRN: [id number]  Chief Complaint  Patient presents with  . Follow-up    1 week for panniculectomy    HPI: The patient is a 43 y.o. female here for follow-up after panniculectomy on 08/04/2019 with Dr. Claudia Desanctis.  She is doing really well, incisions are healing really well.  She does have a small wound just lateral to midline, 0.1 x 0.3 cm.  She reports she has not had any more issues with bowel movements.  She continues to wear compression socks.  She does not have any leg pain, shortness of breath, chest pain, nausea, vomiting, fever, chills.  JP drain on the left has had approximately 10 cc of output every 24 hours.  She reports she had a little bit of drainage from the nose lateral aspect of her left panniculectomy incision, the area is now healed.  No drainage noted.  No periincisional erythema.  She has been walking around her neighborhood.  She has some questions about sleeping and exercise.  Review of Systems  Constitutional: Positive for activity change. Negative for chills, fatigue and fever.  Respiratory: Negative.   Cardiovascular: Negative.   Gastrointestinal: Negative.   Genitourinary: Negative.   Musculoskeletal: Negative.   Skin: Positive for wound.  Neurological: Negative.      Objective:   Vital Signs BP 111/76 (BP Location: Left Arm, Patient Position: Sitting, Cuff Size: Large)   Pulse 63   Temp 97.8 F (36.6 C) (Temporal)   Ht 5\' 6"  (1.676 m)   Wt 203 lb 12.8 oz (92.4 kg)   LMP 08/14/2019 (Exact Date)   SpO2 100%   BMI 32.89 kg/m  Vital Signs and Nursing Note Reviewed Chaperone present Physical Exam  Constitutional: She is oriented to person, place, and time and well-developed, well-nourished, and in no distress.  HENT:  Head: Normocephalic and atraumatic.  Cardiovascular: Normal rate.  Pulmonary/Chest: Effort normal.  Abdominal: Soft. There is no abdominal  tenderness. There is no rebound.    Musculoskeletal:        General: Normal range of motion.  Neurological: She is alert and oriented to person, place, and time. Gait normal.  Skin: Skin is warm and dry. No rash noted. She is not diaphoretic. No erythema. No pallor.  Psychiatric: Mood and affect normal.    Assessment/Plan:     ICD-10-CM   1. Abdominal pannus  E65    Mrs. Ballentine is doing really well.  Her incisions are healing really well.  JP drain on the left was removed today.  Continue to wear abdominal binder -she has a new one that is approximately 8 inches in size and the 12 inch binder, this will be fine. Recommend sleeping in slightly reclined position.  Continue walking around the neighborhood, this will benefit her healing and keep her active.  Continue with multivitamin, vitamin C and eating healthy.  Apply Vaseline to small panniculectomy incision.  Recommend Vaseline daily.  Follow-up in 3 weeks for evaluation.  Call with any questions or concerns.   Carola Rhine Dorotea Hand, PA-C 08/18/2019, 11:33 AM

## 2019-09-05 MED FILL — NP THYROID 30 MG TABLET: 30 | 30 days supply | Qty: 30 | Fill #6

## 2019-09-05 MED FILL — NP THYROID 120 MG TABLET: 120 | 30 days supply | Qty: 30 | Fill #6

## 2019-09-07 ENCOUNTER — Telehealth: Payer: Self-pay | Admitting: Plastic Surgery

## 2019-09-07 NOTE — Telephone Encounter (Signed)

## 2019-09-08 ENCOUNTER — Encounter: Payer: Self-pay | Admitting: Surgical

## 2019-09-08 ENCOUNTER — Encounter: Payer: Self-pay | Admitting: Plastic Surgery

## 2019-09-08 ENCOUNTER — Ambulatory Visit (INDEPENDENT_AMBULATORY_CARE_PROVIDER_SITE_OTHER): Payer: No Typology Code available for payment source | Admitting: Surgical

## 2019-09-08 ENCOUNTER — Other Ambulatory Visit: Payer: Self-pay

## 2019-09-08 VITALS — BP 137/80 | HR 71 | Temp 97.9°F | Ht 66.0 in | Wt 187.0 lb

## 2019-09-08 DIAGNOSIS — E65 Localized adiposity: Secondary | ICD-10-CM

## 2019-09-08 NOTE — Progress Notes (Signed)
   Subjective:     Patient ID: Donna Mccarthy, female    DOB: March 10, 1976, 44 y.o.   MRN: [id number]  Chief Complaint  Patient presents with  . Follow-up   HPI: The patient is a 44 y.o. female here for follow-up status post panniculectomy on 08/04/2019.  She is just over 4 weeks postop.  She is doing really well.  Her incisions are well-healed.  She does report she has had a little bit of drainage from her bellybutton, it does have a foul odor.  It does not appear infected.  There is no surrounding erythema.  The fluid is serosanguineous.  She still has some numbness of her abdomen.  She does report some swelling at the end of the day after being active.  She reports that she wakes up in the morning without any swelling.  She has been wearing abdominal binder at night and spanks cover her abdomen during the day.  She has been continuing to exercise at home via walking and using a floor pedal.   Review of Systems  Constitutional: Positive for activity change. Negative for chills and fever.  Respiratory: Negative.   Cardiovascular: Negative.   Gastrointestinal: Negative.   Skin: Negative for color change and wound.     Objective:   Vital Signs BP 137/80 (BP Location: Left Arm, Patient Position: Sitting, Cuff Size: Large)   Pulse 71   Temp 97.9 F (36.6 C) (Temporal)   Ht 5\' 6"  (1.676 m)   Wt 187 lb (84.8 kg)   LMP 08/14/2019 (Exact Date)   SpO2 100%   BMI 30.18 kg/m  Vital Signs and Nursing Note Reviewed Chaperone present Physical Exam  Constitutional: She is oriented to person, place, and time and well-developed, well-nourished, and in no distress.  HENT:  Head: Normocephalic and atraumatic.  Cardiovascular: Normal rate.  Pulmonary/Chest: Effort normal.  Abdominal: She exhibits no distension.    Musculoskeletal:        General: Normal range of motion.  Neurological: She is alert and oriented to person, place, and time. Gait normal.  Skin: Skin is warm and dry. She is  not diaphoretic. No erythema.  Psychiatric: Mood and affect normal.      Assessment/Plan:     ICD-10-CM   1. Abdominal pannus  E65     Patient to return to work on Tuesday 09/13/2019, she should continue to do light duty for a few more weeks.  Then she should slowly increase activity.  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  Recommend wearing spanks or abdominal binder while working.  She may benefit from wearing the abdominal binder while working due to increased movement.  will leave this up to her depending on how she feels.  She can begin using Mederma cream on the incision.  Suture removed from left most lateral incision.  Return to the gym and slowly increase her exercise tolerance back to baseline.  Avoid any hyper extension of her abdomen.  Recommend slowly increasing and letting pain guide her.  In regards to the umbilical drainage, does not appear infected.  Possible drainage from stitch reaction.  No periwound erythema.  Follow-up in 6 months.  Call with any questions or concerns prior to that.   09/15/2019 Annlouise Gerety, PA-C 09/08/2019, 12:13 PM

## 2019-10-03 MED FILL — TOPIRAMATE 25 MG TAB: 25 | 30 days supply | Qty: 90 | Fill #3

## 2019-10-12 MED FILL — NP THYROID 120 MG TABLET: 120 | 30 days supply | Qty: 30 | Fill #0

## 2019-10-24 ENCOUNTER — Encounter: Payer: Self-pay | Admitting: Family Medicine

## 2019-10-24 ENCOUNTER — Other Ambulatory Visit: Payer: Self-pay

## 2019-10-24 ENCOUNTER — Emergency Department (HOSPITAL_COMMUNITY): Payer: No Typology Code available for payment source

## 2019-10-24 ENCOUNTER — Encounter (HOSPITAL_COMMUNITY): Payer: Self-pay

## 2019-10-24 ENCOUNTER — Ambulatory Visit (INDEPENDENT_AMBULATORY_CARE_PROVIDER_SITE_OTHER): Payer: No Typology Code available for payment source | Admitting: Family Medicine

## 2019-10-24 ENCOUNTER — Emergency Department (HOSPITAL_COMMUNITY)
Admission: EM | Admit: 2019-10-24 | Discharge: 2019-10-24 | Disposition: A | Discharge status: Home / Self Care | Payer: No Typology Code available for payment source | Attending: Emergency Medicine | Admitting: Emergency Medicine

## 2019-10-24 VITALS — BP 130/80 | HR 107 | Temp 98.2°F | Resp 18 | Wt 186.2 lb

## 2019-10-24 DIAGNOSIS — R5383 Other fatigue: Secondary | ICD-10-CM

## 2019-10-24 DIAGNOSIS — R42 Dizziness and giddiness: Secondary | ICD-10-CM | POA: Insufficient documentation

## 2019-10-24 DIAGNOSIS — R0600 Dyspnea, unspecified: Secondary | ICD-10-CM

## 2019-10-24 DIAGNOSIS — E039 Hypothyroidism, unspecified: Secondary | ICD-10-CM | POA: Insufficient documentation

## 2019-10-24 DIAGNOSIS — R000 Tachycardia, unspecified: Secondary | ICD-10-CM

## 2019-10-24 DIAGNOSIS — R55 Syncope and collapse: Secondary | ICD-10-CM

## 2019-10-24 DIAGNOSIS — I951 Orthostatic hypotension: Secondary | ICD-10-CM | POA: Diagnosis not present

## 2019-10-24 DIAGNOSIS — R002 Palpitations: Secondary | ICD-10-CM

## 2019-10-24 DIAGNOSIS — R0602 Shortness of breath: Secondary | ICD-10-CM | POA: Diagnosis not present

## 2019-10-24 DIAGNOSIS — R0609 Other forms of dyspnea: Secondary | ICD-10-CM

## 2019-10-24 LAB — HEPATIC FUNCTION PANEL
ALT: 13 U/L (ref 0–44)
AST: 18 U/L (ref 15–41)
Albumin: 3.7 g/dL (ref 3.5–5.0)
Alkaline Phosphatase: 57 U/L (ref 38–126)
Bilirubin, Direct: <0.1 mg/dL (ref 0.0–0.2)
Total Bilirubin: 0.5 mg/dL (ref 0.3–1.2)
Total Protein: 6.7 g/dL (ref 6.5–8.1)

## 2019-10-24 LAB — TSH: TSH: 2.966 u[IU]/mL (ref 0.350–4.500)

## 2019-10-24 LAB — TROPONIN I (HIGH SENSITIVITY)
Troponin I (High Sensitivity): <2 ng/L (ref <18)
Troponin I (High Sensitivity): 4 ng/L (ref <18)

## 2019-10-24 LAB — CBC
HCT: 37.1 % (ref 36.0–46.0)
Hemoglobin: 11.9 g/dL — ABNORMAL LOW (ref 12.0–15.0)
MCH: 27.3 pg (ref 26.0–34.0)
MCHC: 32.1 g/dL (ref 30.0–36.0)
MCV: 85.1 fL (ref 80.0–100.0)
Platelets: 216 10*3/uL (ref 150–400)
RBC: 4.36 MIL/uL (ref 3.87–5.11)
RDW: 13.8 % (ref 11.5–15.5)
WBC: 5.2 10*3/uL (ref 4.0–10.5)
nRBC: 0 % (ref 0.0–0.2)

## 2019-10-24 LAB — URINALYSIS, ROUTINE W REFLEX MICROSCOPIC
Bilirubin Urine: NEGATIVE
Glucose, UA: NEGATIVE mg/dL
Hgb urine dipstick: NEGATIVE
Ketones, ur: 20 mg/dL — AB
Leukocytes,Ua: NEGATIVE
Nitrite: NEGATIVE
Protein, ur: NEGATIVE mg/dL
Specific Gravity, Urine: 1.025 (ref 1.005–1.030)
pH: 5 (ref 5.0–8.0)

## 2019-10-24 LAB — GLUCOSE, POCT (MANUAL RESULT ENTRY): POC Glucose: 101 mg/dl — AB (ref 70–99)

## 2019-10-24 LAB — BASIC METABOLIC PANEL
Anion gap: 8 (ref 5–15)
BUN: 7 mg/dL (ref 6–20)
CO2: 20 mmol/L — ABNORMAL LOW (ref 22–32)
Calcium: 8.9 mg/dL (ref 8.9–10.3)
Chloride: 112 mmol/L — ABNORMAL HIGH (ref 98–111)
Creatinine, Ser: 0.53 mg/dL (ref 0.44–1.00)
GFR calc Af Amer: >60 mL/min (ref >60)
GFR calc non Af Amer: >60 mL/min (ref >60)
Glucose, Bld: 96 mg/dL (ref 70–99)
Potassium: 3.8 mmol/L (ref 3.5–5.1)
Sodium: 140 mmol/L (ref 135–145)

## 2019-10-24 LAB — D-DIMER, QUANTITATIVE: D-Dimer, Quant: 0.65 ug/mL-FEU — ABNORMAL HIGH (ref 0.00–0.50)

## 2019-10-24 LAB — I-STAT BETA HCG BLOOD, ED (MC, WL, AP ONLY): I-stat hCG, quantitative: <5 m[IU]/mL (ref <5)

## 2019-10-24 MED ORDER — SODIUM CHLORIDE 0.9% FLUSH
3.0000 mL | Freq: Once | INTRAVENOUS | Status: AC
  Administered 2019-10-24: 3 mL via INTRAVENOUS

## 2019-10-24 MED ORDER — SODIUM CHLORIDE 0.9 % IV BOLUS
1000.0000 mL | Freq: Once | INTRAVENOUS | Status: AC
  Administered 2019-10-24: 1000 mL via INTRAVENOUS

## 2019-10-24 MED ORDER — IOHEXOL 350 MG/ML SOLN
80.0000 mL | Freq: Once | INTRAVENOUS | Status: AC | PRN
  Administered 2019-10-24: 80 mL via INTRAVENOUS

## 2019-10-24 NOTE — ED Notes (Signed)
Patient verbalizes understanding of discharge instructions. Opportunity for questioning and answers were provided. Armband removed by staff, pt discharged from ED to home via POV  

## 2019-10-24 NOTE — Discharge Instructions (Signed)
You were seen in the emergency department today with lightheadedness with shortness of breath and elevated heart rate.  Your lab work and CT scans were normal.  Please drink plenty of fluids and take your medication as prescribed.  I am placing a referral in the system to see a cardiologist given the symptoms.  Please also discuss with your primary care doctor by phone tomorrow and schedule a follow-up appointment.  If your symptoms return or worsen you should be seen in the emergency department immediately.

## 2019-10-24 NOTE — ED Triage Notes (Signed)
Pt reports SOB and dizziness starting yesterday. PCP sent pt here for further evaluation of orthostatic changes.  Sitting BP 130/80 HR 76 Standing 110/72 HR 107

## 2019-10-24 NOTE — Progress Notes (Signed)
Subjective:    Patient ID: Donna Mccarthy, female    DOB: 16-May-1976, 44 y.o.   MRN: [id number]  HPI Chief Complaint  Patient presents with  . 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  Reports dizziness upon standing. 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 States she has not been drinking a lot of water.  She has not been eating well either.  States at work yesterday the symptoms began and she was found to be orthostatic.  She was given 1 L of IV fluids and felt somewhat improved.  States they did an EKG and told her it was fine.  This was not documented in the record. States they also checked her blood sugar yesterday and it was 154, however this was after drinking a soda.   History of surgery , a tummy tuck on 08/04/2019. Thyroid level checked 2 weeks ago. Medication was decreased by Dr. Talmage Nap.   No other pertinent information.  Denies fever, chills, headache, chest pain, palpitations, cough, vomiting or diarrhea.   LMP: last week  Denies chance of pregnancy  Review of Systems Pertinent positives and negatives in the history of present illness.     Objective:   Physical Exam Constitutional:      General: She is not in acute distress.    Appearance: She is ill-appearing.  Eyes:     Conjunctiva/sclera: Conjunctivae normal.     Pupils: Pupils are equal, round, and reactive to light.  Cardiovascular:     Rate and Rhythm: Regular rhythm. Tachycardia present.     Pulses: Normal pulses.  Pulmonary:     Effort: Pulmonary effort is normal.     Breath sounds: Normal breath sounds.  Abdominal:     Palpations: Abdomen is soft.  Musculoskeletal:        General: Normal range of motion.     Cervical back: Normal range of motion and neck supple.     Right  lower leg: Normal.     Left lower leg: Normal.     Comments: Negative Homans  Lymphadenopathy:     Cervical: No cervical adenopathy.  Skin:    General: Skin is warm and dry.     Capillary Refill: Capillary refill takes less than 2 seconds.     Coloration: Skin is not pale.     Findings: No rash.  Neurological:     General: No focal deficit present.     Mental Status: She is alert.     Cranial Nerves: No cranial nerve deficit.     Motor: No weakness.  Psychiatric:        Mood and Affect: Mood normal.        Thought Content: Thought content normal.    BP 130/80 (BP Location: Right Arm, Patient Position: Standing, Cuff Size: Large)   Pulse (!) 107   Temp 98.2 F (36.8 C)   Resp 18   Wt 186 lb 3.2 oz (84.5 kg)   SpO2 98%   BMI 30.05 kg/m       Assessment & Plan:  Orthostatic hypotension  Fatigue, unspecified type  DOE (dyspnea on exertion)  Dizziness on standing  Tachycardia  CBG fingerstick 101 in office, she has not eaten in several hours. She presents with a 24-hour  history of fatigue, dizziness upon standing, shortness of breath with exertion and was apparently orthostatic yesterday and given 1 L of IV fluids at the clinic where she was working.  She felt some improvement.  She continues with the same symptoms today and is orthostatic once again in the office.  No sign of fluid loss.  No obvious explanation for her symptoms.  She had her first Covid vaccine within the last week. She will be taking a private vehicle to [facility] ED for further evaluation and treatment, declines ambulance transfer.

## 2019-10-24 NOTE — Patient Instructions (Addendum)
She is orthostatic with drop in BP and pulse increased from 76 to 107 upon standing.   She is dizzy and short of breath.   Needs evaluation for orthostatic hypotension, dizziness, DOE and severe fatigue.   Blood sugar 101 in the office at 3 pm

## 2019-10-24 NOTE — ED Notes (Signed)
ED Provider at bedside. 

## 2019-10-24 NOTE — ED Provider Notes (Signed)
Emergency Department Provider Note   I have reviewed the triage vital signs and the nursing notes.   HISTORY  Chief Complaint Shortness of Breath and Dizziness   HPI Donna Mccarthy is a 44 y.o. female with PMH hypothyroidism, GERD, and anemia presents emergency department for evaluation of shortness of breath with lightheadedness upon standing.  Symptoms began yesterday without fever, cough, URI symptoms.  No vomiting or diarrhea.  Patient has been helping in a Covid vaccine site and felt lightheaded yesterday.  She did experience some left-sided chest discomfort at that time that resolved after taking Tylenol.  EMS was called who gave IV fluids after the patient was found to be orthostatic.  She was told that she was very dehydrated and seemed to feel somewhat better.   This morning, she called to schedule a PCP appointment.  They saw her and evaluated her symptoms ultimately recommending that she present to the emergency department for further evaluation.  Patient is not having chest pain or severe shortness of breath at this time.  She is not having flulike symptoms.  Her thyroid medication was recently decreased from 150 mcg to 120 mcg after check of her thyroid labs.  She did receive the first COVID-19 vaccine 7 days ago without issue. No radiation of symptoms or modifying factors.    Past Medical History:  Diagnosis Date  . Allergy   . Anemia   . Anxiety   . Arthritis    arthritis knees  . Blood transfusion without reported diagnosis   . GERD (gastroesophageal reflux disease)   . Hypothyroidism   . Neuromuscular disorder (HCC)   . Obesity (BMI 30-39.9) 01/05/2019    Patient Active Problem List   Diagnosis Date Noted  . Family history of diabetes mellitus in grandmother 04/20/2019  . Obesity (BMI 30.0-34.9) 04/20/2019  . Obesity (BMI 30-39.9) 01/05/2019  . Hypothyroidism 09/29/2017  . Migraine 09/29/2017  . Knee pain, bilateral 09/29/2017  . Generalized anxiety  disorder 09/29/2017  . Gastroesophageal reflux disease 09/29/2017    Past Surgical History:  Procedure Laterality Date  . DILATION AND CURETTAGE OF UTERUS    . PANNICULECTOMY N/A 08/04/2019   Procedure: PANNICULECTOMY;  Surgeon: [name], MD;  Location: [facility];  Service: Plastics;  Laterality: N/A;  2 hours only, please    Allergies Penicillins and Toradol [ketorolac tromethamine]  Family History  Problem Relation Age of Onset  . Hypertension Mother   . Hypertension Father   . Stroke Father        TIA  . Hyperlipidemia Father   . Colon cancer Maternal Aunt   . Diabetes Maternal Grandmother   . Heart failure Maternal Grandmother     Social History Social History   Tobacco Use  . Smoking status: Never Smoker  . Smokeless tobacco: Never Used  Substance Use Topics  . Alcohol use: No  . Drug use: No    Review of Systems  Constitutional: No fever/chills. Positive lightheadedness with standing.  Eyes: No visual changes. ENT: No sore throat. Cardiovascular: Denies chest pain. Respiratory: Positive shortness of breath. Gastrointestinal: No abdominal pain.  No nausea, no vomiting.  No diarrhea.  No constipation. Genitourinary: Negative for dysuria. Musculoskeletal: Negative for back pain. Skin: Negative for rash. Neurological: Negative for headaches, focal weakness or numbness.  10-point ROS otherwise negative.  ____________________________________________   PHYSICAL EXAM:  VITAL SIGNS: ED Triage Vitals  Enc Vitals Group     BP 10/24/19 1534 113/71  Pulse Rate 10/24/19 1534 85     Resp 10/24/19 1534 20     Temp 10/24/19 1534 98.8 F (37.1 C)     Temp Source 10/24/19 1534 Oral     SpO2 10/24/19 1534 99 %   Constitutional: Alert and oriented. Well appearing and in no acute distress. Eyes: Conjunctivae are normal. Head: Atraumatic. Nose: No congestion/rhinnorhea. Mouth/Throat: Mucous membranes are moist.   Neck: No stridor.    Cardiovascular: Normal rate, regular rhythm. Good peripheral circulation. Grossly normal heart sounds.   Respiratory: Normal respiratory effort.  No retractions. Lungs CTAB. Gastrointestinal: Soft and nontender. No distention.  Musculoskeletal: No lower extremity tenderness nor edema. No gross deformities of extremities. Neurologic:  Normal speech and language. No gross focal neurologic deficits are appreciated.  Skin:  Skin is warm, dry and intact. No rash noted.   ____________________________________________   LABS (all labs ordered are listed, but only abnormal results are displayed)  Labs Reviewed  BASIC METABOLIC PANEL - Abnormal; Notable for the following components:      Result Value   Chloride 112 (*)    CO2 20 (*)    All other components within normal limits  CBC - Abnormal; Notable for the following components:   Hemoglobin 11.9 (*)    All other components within normal limits  URINALYSIS, ROUTINE W REFLEX MICROSCOPIC - Abnormal; Notable for the following components:   Ketones, ur 20 (*)    All other components within normal limits  D-DIMER, QUANTITATIVE (NOT AT [facility]) - Abnormal; Notable for the following components:   D-Dimer, Quant 0.65 (*)    All other components within normal limits  TSH  HEPATIC FUNCTION PANEL  CBG MONITORING, ED  I-STAT BETA HCG BLOOD, ED ([facility], [facility], [facility] ONLY)  TROPONIN I (HIGH SENSITIVITY)  TROPONIN I (HIGH SENSITIVITY)   ____________________________________________  EKG   EKG Interpretation  Date/Time:  Monday October 24 2019 15:39:08 EST Ventricular Rate:  77 PR Interval:  158 QRS Duration: 84 QT Interval:  388 QTC Calculation: 439 R Axis:   -17 Text Interpretation: Normal sinus rhythm Possible Anterior infarct , age undetermined Abnormal ECG No STEMI Confirmed by Alona Bene [phone]) on 10/24/2019 6:34:30 PM       ____________________________________________  RADIOLOGY  CT Angio Chest PE W and/or Wo Contrast  Result Date:  10/24/2019 CLINICAL DATA:  Shortness of breath EXAM: CT ANGIOGRAPHY CHEST WITH CONTRAST TECHNIQUE: Multidetector CT imaging of the chest was performed using the standard protocol during bolus administration of intravenous contrast. Multiplanar CT image reconstructions and MIPs were obtained to evaluate the vascular anatomy. CONTRAST:  6mL OMNIPAQUE IOHEXOL 350 MG/ML SOLN COMPARISON:  Chest x-ray 10/24/2019 FINDINGS: Cardiovascular: Satisfactory opacification of the pulmonary arteries to the segmental level. No evidence of pulmonary embolism. Normal heart size. No pericardial effusion. Nonaneurysmal aorta. Mediastinum/Nodes: No enlarged mediastinal, hilar, or axillary lymph nodes. Thyroid gland, trachea, and esophagus demonstrate no significant findings. Lungs/Pleura: Lungs are clear. No pleural effusion or pneumothorax. Upper Abdomen: No acute abnormality. Musculoskeletal: No chest wall abnormality. No acute or significant osseous findings. Review of the MIP images confirms the above findings. IMPRESSION: Negative.  No CT evidence for acute pulmonary embolus Electronically Signed   By: Jasmine Pang M.D.   On: 10/24/2019 20:58   DG Chest Portable 1 View  Result Date: 10/24/2019 CLINICAL DATA:  45 year old female with shortness of breath and dizziness. Near syncope. EXAM: PORTABLE CHEST 1 VIEW COMPARISON:  None. FINDINGS: Portable AP semi upright view at 1846 hours. Normal lung  volumes and mediastinal contours. Visualized tracheal air column is within normal limits. Allowing for portable technique the lungs are clear. Negative visible bowel gas pattern and osseous structures. IMPRESSION: Negative portable chest. Electronically Signed   By: Genevie Ann M.D.   On: 10/24/2019 19:07    ____________________________________________   PROCEDURES  Procedure(s) performed:   Procedures  None ____________________________________________   INITIAL IMPRESSION / ASSESSMENT AND PLAN / ED COURSE  Pertinent labs &  imaging results that were available during my care of the patient were reviewed by me and considered in my medical decision making (see chart for details).   Patient presents to the emergency department for evaluation of intermittent shortness of breath with positional lightheadedness.  Her initial blood work from triage is normal.  Given her shortness of breath symptoms I do plan on screening for PE with a D-dimer.  Patient is otherwise low risk for PE.  Have added a troponin, TSH, will follow chest x-ray, and UA. Will give IVF here and reassess.   Labs and imaging reviewed. D-dimer slightly elevated which prompted CTA chest. No PE on CTA. Plan for outpatient PCP and Cardiology f/u with near syncope and palpitations with elevated HR to consider ambulatory heart monitoring +/- ECHO. Discussed ED return precautions.  ____________________________________________  FINAL CLINICAL IMPRESSION(S) / ED DIAGNOSES  Final diagnoses:  Shortness of breath  Palpitations  Near syncope     MEDICATIONS GIVEN DURING THIS VISIT:  Medications  sodium chloride flush (NS) 0.9 % injection 3 mL (3 mLs Intravenous Given 10/24/19 1905)  sodium chloride 0.9 % bolus 1,000 mL (0 mLs Intravenous Stopped 10/24/19 2226)  iohexol (OMNIPAQUE) 350 MG/ML injection 80 mL (80 mLs Intravenous Contrast Given 10/24/19 2051)    Note:  This document was prepared using Dragon voice recognition software and may include unintentional dictation errors.  Nanda Quinton, MD, [facility] Emergency Medicine    Tajuanna Burnett, Wonda Olds, MD 10/25/19 1113

## 2019-10-25 ENCOUNTER — Other Ambulatory Visit: Payer: Self-pay | Admitting: Family Medicine

## 2019-10-25 DIAGNOSIS — R002 Palpitations: Secondary | ICD-10-CM

## 2019-10-25 DIAGNOSIS — R0609 Other forms of dyspnea: Secondary | ICD-10-CM

## 2019-10-25 DIAGNOSIS — R55 Syncope and collapse: Secondary | ICD-10-CM

## 2019-10-25 DIAGNOSIS — R0600 Dyspnea, unspecified: Secondary | ICD-10-CM

## 2019-10-26 ENCOUNTER — Other Ambulatory Visit: Payer: Self-pay

## 2019-10-26 ENCOUNTER — Ambulatory Visit (INDEPENDENT_AMBULATORY_CARE_PROVIDER_SITE_OTHER): Payer: No Typology Code available for payment source | Admitting: Cardiology

## 2019-10-26 ENCOUNTER — Telehealth: Payer: Self-pay | Admitting: Radiology

## 2019-10-26 ENCOUNTER — Encounter: Payer: Self-pay | Admitting: Cardiology

## 2019-10-26 VITALS — Temp 97.2°F | Ht 67.0 in | Wt 185.6 lb

## 2019-10-26 DIAGNOSIS — R002 Palpitations: Secondary | ICD-10-CM | POA: Diagnosis not present

## 2019-10-26 DIAGNOSIS — R42 Dizziness and giddiness: Secondary | ICD-10-CM | POA: Diagnosis not present

## 2019-10-26 NOTE — Patient Instructions (Signed)
Medication Instructions:  Your physician recommends that you continue on your current medications as directed. Please refer to the Current Medication list given to you today.  *If you need a refill on your cardiac medications before your next appointment, please call your pharmacy*   Lab Work: NONE  Testing/Procedures: Your physician has requested that you have an echocardiogram. Echocardiography is a painless test that uses sound waves to create images of your heart. It provides your doctor with information about the size and shape of your heart and how well your heart's chambers and valves are working. This procedure takes approximately one hour. There are no restrictions for this procedure.  This will be done at our [address] location:  [address]   ZIO XT- Long Term Monitor Instructions   Your physician has requested you wear your ZIO patch monitor___7____days.   This is a single patch monitor.  [employer] supplies one patch monitor per enrollment.  Additional stickers are not available.   Please do not apply patch if you will be having a Nuclear Stress Test, Echocardiogram, Cardiac CT, MRI, or Chest Xray during the time frame you would be wearing the monitor. The patch cannot be worn during these tests.  You cannot remove and re-apply the ZIO XT patch monitor.   Your ZIO patch monitor will be sent USPS Priority mail from [employer] directly to your home address. The monitor may also be mailed to a PO BOX if home delivery is not available.   It may take 3-5 days to receive your monitor after you have been enrolled.   Once you have received you monitor, please review enclosed instructions.  Your monitor has already been registered assigning a specific monitor serial # to you.   Applying the monitor   Shave hair from upper left chest.   Hold abrader disc by orange tab.  Rub abrader in 40 strokes over left upper chest as indicated in your monitor  instructions.   Clean area with 4 enclosed alcohol pads .  Use all pads to assure are is cleaned thoroughly.  Let dry.   Apply patch as indicated in monitor instructions.  Patch will be place under collarbone on left side of chest with arrow pointing upward.   Rub patch adhesive wings for 2 minutes.Remove white label marked "1".  Remove white label marked "2".  Rub patch adhesive wings for 2 additional minutes.   While looking in a mirror, press and release button in center of patch.  A small green light will flash 3-4 times .  This will be your only indicator the monitor has been turned on.     Do not shower for the first 24 hours.  You may shower after the first 24 hours.   Press button if you feel a symptom. You will hear a small click.  Record Date, Time and Symptom in the Patient Log Book.   When you are ready to remove patch, follow instructions on last 2 pages of Patient Log Book.  Stick patch monitor onto last page of Patient Log Book.   Place Patient Log Book in Blue box.  Use locking tab on box and tape box closed securely.  The Orange and White box has prepaid postage on it.  Please place in mailbox as soon as possible.  Your physician should have your test results approximately 7 days after the monitor has been mailed back to [employer].   Call [employer] Customer Care at [phone]   if you have questions regarding your ZIO XT patch monitor.  Call them immediately if you see an orange light blinking on your monitor.   If your monitor falls off in less than 4 days contact our Monitor department at [phone].  If your monitor becomes loose or falls off after 4 days call [employer] at [phone] for suggestions on securing your monitor.     Follow-Up: At [facility], you and your health needs are our priority.  As part of our continuing mission to provide you with exceptional heart care, we have created designated Provider Care Teams.  These Care Teams include your  primary Cardiologist (physician) and Advanced Practice Providers (APPs -  Physician Assistants and Nurse Practitioners) who all work together to provide you with the care you need, when you need it.  We recommend signing up for the patient portal called "MyChart".  Sign up information is provided on this After Visit Summary.  MyChart is used to connect with patients for Virtual Visits (Telemedicine).  Patients are able to view lab/test results, encounter notes, upcoming appointments, etc.  Non-urgent messages can be sent to your provider as well.   To learn more about what you can do with MyChart, go to [email or web address].    Your next appointment:   3 month(s)  The format for your next appointment:   Either In Person or Virtual  Provider:   Christopher Schumann, MD    

## 2019-10-26 NOTE — Progress Notes (Signed)
Cardiology Office Note:    Date:  10/26/2019   ID:  Donna Mccarthy, DOB [birth year], MRN [id number]  PCP:  Avanell Shackleton, NP-C  Cardiologist:  No primary care provider on file.  Electrophysiologist:  None   Referring MD: Avanell Shackleton, NP-C   Chief Complaint  Patient presents with  . Palpitations    History of Present Illness:    Donna Mccarthy is a 44 y.o. female with a hx of hypothyroidism, migraines, GERDwho is referred by Beather Arbour, NP for evaluation of palpitations.  She had ED visit on 10/24/2019 with shortness of breath and lightheadedness.  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  EMS was called and she received IV fluids.  The following day she was seen by her PCP recommended that she go to the ED as orthostatics were positive.  In the ED, D-dimer was elevated, so she underwent a CTPA, which was negative for PE.  She does report that she has been having palpitations.  She describes as fluttering feeling in her chest, last 30 seconds or so.  Occurs about every other day.  Does occur with exertion.  Feels lightheaded during episodes.  She goes to the gym 4-5 times per week.  Does elliptical and treadmill, denies any exertional chest pain or dyspnea.  No smoking history.  Father has POTS.  Mother has hypertension.   Past Medical History:  Diagnosis Date  . Allergy   . Anemia   . Anxiety   . Arthritis    arthritis knees  . Blood transfusion without reported diagnosis   . GERD (gastroesophageal reflux disease)   . Hypothyroidism   . Neuromuscular disorder (HCC)   . Obesity (BMI 30-39.9) 01/05/2019    Past Surgical History:  Procedure Laterality Date  . DILATION AND CURETTAGE OF UTERUS    . PANNICULECTOMY N/A 08/04/2019   Procedure: PANNICULECTOMY;  Surgeon: [name], MD;  Location: [facility];  Service: Plastics;  Laterality: N/A;  2 hours only, please     Current Medications: Current Meds  Medication Sig  . Ascorbic Acid (VITAMIN C) 100 MG tablet Take 100 mg by mouth daily.  Marland Kitchen levonorgestrel (MIRENA) 20 MCG/24HR IUD 1 each by Intrauterine route once.  . thyroid (ARMOUR THYROID) 120 MG tablet Take 120 mg by mouth daily before breakfast.  . topiramate (TOPAMAX) 25 MG tablet TAKE 3 TABLETS (75 MG TOTAL) BY MOUTH AT BEDTIME.  . vitamin B-12 (CYANOCOBALAMIN) 50 MCG tablet Take 50 mcg by mouth daily.     Allergies:   Penicillins and Toradol [ketorolac tromethamine]   Social History   Socioeconomic History  . Marital status: Single    Spouse name: Not on file  . Number of children: 1  . Years of education: 44  . Highest education level: Associate degree: occupational, Scientist, product/process development, or vocational program  Occupational History  . Occupation: cma    Employer: [employer]  Tobacco Use  . Smoking status: Never Smoker  . Smokeless tobacco: Never Used  Substance and Sexual Activity  . Alcohol use: No  . Drug use: No  . Sexual activity: Yes    Birth control/protection: I.U.D.  Other Topics Concern  . 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 Difficulty of Paying Living Expenses: Not on file  Food Insecurity:   . Worried About Charity fundraiser in the Last Year: Not on file  . Ran Out of Food in the Last Year: Not on file  Transportation Needs:   . Lack of Transportation (Medical): Not on file  . Lack of Transportation (Non-Medical): Not on file  Physical Activity:   . Days of Exercise per Week: Not on file  . Minutes of Exercise per Session: Not on file  Stress:   . Feeling of Stress : Not on file  Social Connections:   . Frequency of Communication with Friends and Family: Not on file  .  Frequency of Social Gatherings with Friends and Family: Not on file  . Attends Religious Services: Not on file  . Active Member of Clubs or Organizations: Not on file  . Attends Archivist Meetings: Not on file  . Marital Status: Not on file     Family History: The patient's family history includes Colon cancer in her maternal aunt; Diabetes in her maternal grandmother; Heart failure in her maternal grandmother; Hyperlipidemia in her father; Hypertension in her father and mother; Stroke in her father.  ROS:   Please see the history of present illness.     All other systems reviewed and are negative.  EKGs/Labs/Other Studies Reviewed:    The following studies were reviewed today:   EKG:  EKG is ordered today.  The ekg ordered today demonstrates normal sinus rhythm with sinus arrhythmia, rate 63  Recent Labs: 10/24/2019: ALT 13; BUN 7; Creatinine, Ser 0.53; Hemoglobin 11.9; Platelets 216; Potassium 3.8; Sodium 140; TSH 2.966  Recent Lipid Panel    Component Value Date/Time   CHOL 149 04/20/2019 1001   TRIG 50 04/20/2019 1001   HDL 48 04/20/2019 1001   CHOLHDL 3.1 04/20/2019 1001   LDLCALC 90 04/20/2019 1001    Physical Exam:    VS:  Temp (!) 97.2 F (36.2 C)   Ht 5\' 7"  (1.702 m)   Wt 185 lb 9.6 oz (84.2 kg)   SpO2 99%   BMI 29.07 kg/m     Wt Readings from Last 3 Encounters:  10/26/19 185 lb 9.6 oz (84.2 kg)  10/24/19 186 lb 3.2 oz (84.5 kg)  09/08/19 187 lb (84.8 kg)  Orthostatics: Lying 114/70 59 Sitting 102/77 63 Standing 102/70 71  GEN:  Well nourished, well developed in no acute distress HEENT: Normal NECK: No JVD; No carotid bruits LYMPHATICS: No lymphadenopathy CARDIAC: RRR, no murmurs, rubs, gallops RESPIRATORY:  Clear to auscultation without rales, wheezing or rhonchi  ABDOMEN: Soft, non-tender, non-distended MUSCULOSKELETAL:  No edema; No deformity  SKIN: Warm and dry NEUROLOGIC:  Alert and oriented x 3 PSYCHIATRIC:  Normal affect    ASSESSMENT:    1. Palpitations   2. Lightheadedness    PLAN:    Lightheadedness/Near syncope: Suspect due to dehydration.  Normal orthostatics in clinic today.  Will check TTE to rule out structural heart disease  Palpitations: Concerning for arrhythmia, will check Zio patch x1 week  RTC in 3 months  Medication Adjustments/Labs and Tests Ordered: Current medicines are reviewed at length with the patient today.  Concerns regarding medicines are outlined above.  Orders Placed This Encounter  Procedures  . LONG TERM MONITOR (3-14 DAYS)  . EKG 12-Lead  . ECHOCARDIOGRAM COMPLETE   No orders of  the defined types were placed in this encounter.   Patient Instructions  Medication Instructions:  Your physician recommends that you continue on your current medications as directed. Please refer to the Current Medication list given to you today.  *If you need a refill on your cardiac medications before your next appointment, please call your pharmacy*   Lab Work: NONE  Testing/Procedures: Your physician has requested that you have an echocardiogram. Echocardiography is a painless test that uses sound waves to create images of your heart. It provides your doctor with information about the size and shape of your heart and how well your heart's chambers and valves are working. This procedure takes approximately one hour. There are no restrictions for this procedure. This will be done at our [facility] location:  [address]   ZIO XT- Long Term Monitor Instructions   Your physician has requested you wear your ZIO patch monitor 7 days.   This is a single patch monitor.  [employer] supplies one patch monitor per enrollment.  Additional stickers are not available.   Please do not apply patch if you will be having a Nuclear Stress Test, Echocardiogram, Cardiac CT, MRI, or Chest Xray during the time frame you would be wearing the monitor. The patch cannot be worn during  these tests.  You cannot remove and re-apply the ZIO XT patch monitor.   Your ZIO patch monitor will be sent USPS Priority mail from [facility] directly to your home address. The monitor may also be mailed to a PO BOX if home delivery is not available.   It may take 3-5 days to receive your monitor after you have been enrolled.   Once you have received you monitor, please review enclosed instructions.  Your monitor has already been registered assigning a specific monitor serial # to you.   Applying the monitor   Shave hair from upper left chest.   Hold abrader disc by orange tab.  Rub abrader in 40 strokes over left upper chest as indicated in your monitor instructions.   Clean area with 4 enclosed alcohol pads .  Use all pads to assure are is cleaned thoroughly.  Let dry.   Apply patch as indicated in monitor instructions.  Patch will be place under collarbone on left side of chest with arrow pointing upward.   Rub patch adhesive wings for 2 minutes.Remove white label marked "1".  Remove white label marked "2".  Rub patch adhesive wings for 2 additional minutes.   While looking in a mirror, press and release button in center of patch.  A small green light will flash 3-4 times .  This will be your only indicator the monitor has been turned on.     Do not shower for the first 24 hours.  You may shower after the first 24 hours.   Press button if you feel a symptom. You will hear a small click.  Record Date, Time and Symptom in the Patient Log Book.   When you are ready to remove patch, follow instructions on last 2 pages of Patient Log Book.  Stick patch monitor onto last page of Patient Log Book.   Place Patient Log Book in [place] box.  Use locking tab on box and tape box closed securely.  The [employer] and [employer] has [employer] on it.  Please place in mailbox as soon as possible.  Your physician should have your test results approximately 7 days after the monitor has  been  mailed back to [facility].   Call [facility] Customer Care at [phone] if you have questions regarding your ZIO XT patch monitor.  Call them immediately if you see an orange light blinking on your monitor.   If your monitor falls off in less than 4 days contact our Monitor department at [phone].  If your monitor becomes loose or falls off after 4 days call [employer] at [phone] for suggestions on securing your monitor.     Follow-Up: At [facility], you and your health needs are our priority.  As part of our continuing mission to provide you with exceptional heart care, we have created designated Provider Care Teams.  These Care Teams include your primary Cardiologist (physician) and Advanced Practice Providers (APPs -  Physician Assistants and Nurse Practitioners) who all work together to provide you with the care you need, when you need it.  We recommend signing up for the patient portal called "MyChart".  Sign up information is provided on this After Visit Summary.  MyChart is used to connect with patients for Virtual Visits (Telemedicine).  Patients are able to view lab/test results, encounter notes, upcoming appointments, etc.  Non-urgent messages can be sent to your provider as well.   To learn more about what you can do with MyChart, go to [email or web address].    Your next appointment:   3 month(s)  The format for your next appointment:   Either In Person or Virtual  Provider:   Epifanio Lesches, MD        Signed, Little Ishikawa, MD  10/26/2019 6:29 PM    [facility]

## 2019-10-26 NOTE — Telephone Encounter (Signed)
Enrolled patient for a 7 day Zio monitor to be mailed to patients home.  

## 2019-10-29 ENCOUNTER — Other Ambulatory Visit (INDEPENDENT_AMBULATORY_CARE_PROVIDER_SITE_OTHER): Payer: No Typology Code available for payment source

## 2019-10-29 DIAGNOSIS — R002 Palpitations: Secondary | ICD-10-CM | POA: Diagnosis not present

## 2019-11-09 ENCOUNTER — Ambulatory Visit (HOSPITAL_COMMUNITY): Payer: No Typology Code available for payment source | Attending: Cardiology

## 2019-11-09 ENCOUNTER — Other Ambulatory Visit: Payer: Self-pay

## 2019-11-09 DIAGNOSIS — R002 Palpitations: Secondary | ICD-10-CM | POA: Diagnosis present

## 2019-11-11 MED FILL — NP THYROID 120 MG TABLET: 120 | 30 days supply | Qty: 30 | Fill #1

## 2019-11-11 MED FILL — TOPIRAMATE 25 MG TAB: 25 | 30 days supply | Qty: 90 | Fill #4

## 2019-12-09 MED FILL — TOPIRAMATE 25 MG TAB: 25 | 30 days supply | Qty: 90 | Fill #5

## 2019-12-09 MED FILL — NP THYROID 120 MG TABLET: 120 | 30 days supply | Qty: 30 | Fill #2

## 2019-12-30 ENCOUNTER — Telehealth: Payer: Self-pay | Admitting: Plastic Surgery

## 2019-12-30 NOTE — Telephone Encounter (Signed)
Spoke with patient.  Denies F, NV, drainage. She is wearing spanks and binder during the day. Feels the dull sensation sometimes with movement or after taking everything off at night. Suspect some scar tissue and nerve sensitivity. She is going to call to schedule an appt with [place] to take a look at it just to make sure it's ok.

## 2019-12-30 NOTE — Telephone Encounter (Signed)
Patient said she is having some pain near her belly button where they moved it during her surgery a few months ago. She said it's not sharp or intense and that it's more aggravating. If she pushes on it or moves a certain way, she can feel the pain. She still wears her brace during working hours. Please call to advise if this is normal because feeling may be coming back or if she needs to do anything.

## 2020-01-01 ENCOUNTER — Ambulatory Visit (INDEPENDENT_AMBULATORY_CARE_PROVIDER_SITE_OTHER)
Admission: RE | Admit: 2020-01-01 | Discharge: 2020-01-01 | Disposition: A | Discharge status: Home / Self Care | Payer: No Typology Code available for payment source | Source: Ambulatory Visit

## 2020-01-01 DIAGNOSIS — H9202 Otalgia, left ear: Secondary | ICD-10-CM | POA: Diagnosis not present

## 2020-01-01 MED ORDER — AZELASTINE HCL 0.1 % NA SOLN: 2.0000 | Freq: Two times a day (BID) | NASAL | 0 refills | Status: DC

## 2020-01-01 MED ORDER — FLUTICASONE PROPIONATE 50 MCG/ACT NA SUSP: 2.0000 | Freq: Every day | NASAL | 0 refills | Status: DC

## 2020-01-01 MED ORDER — OFLOXACIN 0.3 % OT SOLN: 10.0000 [drp] | Freq: Every day | OTIC | 0 refills | Status: AC

## 2020-01-01 NOTE — Discharge Instructions (Addendum)
Start flonase and azelastine as directed for possible eustachian tube dysfunction causing symptoms. Ofloxacin ear drop to treat for possible outer ear infection. If symptoms not improving, follow up in person for further evaluation.  [facility] at [facility]) [address] [place], [place], [place] [address] [phone]  [facility] at [facility] [address] [place], [place] [address] [phone]  [facility] Care at [facility] [address] [place], [place] [address] [phone]  [facility] at [facility] [address], [place], [place] [address] [phone]  [facility] at [facility] [address] [place], [place] [address] [phone]  [facility] at [facility] [address], [place], [place] [address] [phone]

## 2020-01-01 NOTE — ED Provider Notes (Signed)
Virtual Visit via Video Note:  Donna Mccarthy  initiated request for Telemedicine visit with [facility] team. I connected with Donna Mccarthy  on 01/01/2020 at 12:10 PM  for a synchronized telemedicine visit using a video enabled HIPPA compliant telemedicine application. I verified that I am speaking with Donna Mccarthy  using two identifiers. Storm Sovine Doree Fudge, PA-C  was physically located in a [facility] Urgent care site and DEVYNNE STURDIVANT was located at a different location.   The limitations of evaluation and management by telemedicine as well as the availability of in-person appointments were discussed. Patient was informed that she  may incur a bill ( including co-pay) for this virtual visit encounter. Donna Mccarthy  expressed understanding and gave verbal consent to proceed with virtual visit.     History of Present Illness:Donna Mccarthy  is a 44 y.o. female presents with 2 day history of left ear pain. Left sharp pain to the left ear. Feels post nasal drainage. Pain worse with swallowing. Muffled hearing. Mild rhinorrhea, watering eye. Denies cough, fever, shortness of breath. Takes daily loratadine.     Past Medical History:  Diagnosis Date  . Allergy   . Anemia   . Anxiety   . Arthritis    arthritis knees  . Blood transfusion without reported diagnosis   . GERD (gastroesophageal reflux disease)   . Hypothyroidism   . Neuromuscular disorder (HCC)   . Obesity (BMI 30-39.9) 01/05/2019    Allergies  Allergen Reactions  . Penicillins Other (See Comments)    Mother said she had a rash  . Toradol [Ketorolac Tromethamine] Other (See Comments)    "felt like whole body was on fire"        Observations/Objective: General: Well appearing, nontoxic, no acute distress. Sitting comfortably. Head: Normocephalic, atraumatic Eye: No conjunctival injection, eyelid swelling. EOMI ENT: Mucus membranes moist, no lip cracking. No obvious nasal drainage. Left  maxillary tenderness on self palpation. Tenderness to left tragus Pulm: Speaking in full sentences without difficulty. Normal effort. No respiratory distress, accessory muscle use. Neuro: Normal mental status. Alert and oriented x 3.   Assessment and Plan: Will provide flonase/azelastine nasal spray for possible eustachian tube dysfunction. Ofloxacin ear drop from possible otitis externa. If symptoms not improving, to follow up in person for evaluation of possible otitis media. Return precautions given. Patient expresses understanding and agrees to plan.  Follow Up Instructions:    I discussed the assessment and treatment plan with the patient. The patient was provided an opportunity to ask questions and all were answered. The patient agreed with the plan and demonstrated an understanding of the instructions.   The patient was advised to call back or seek an in-person evaluation if the symptoms worsen or if the condition fails to improve as anticipated.  I provided 10 minutes of non-face-to-face time during this encounter.    Belinda Fisher, PA-C  01/01/2020 12:10 PM         Belinda Fisher, PA-C 01/01/20 1228

## 2020-01-02 DIAGNOSIS — Z9889 Other specified postprocedural states: Secondary | ICD-10-CM | POA: Insufficient documentation

## 2020-01-02 NOTE — Progress Notes (Signed)
Patient is a 44 year old female here for follow-up after undergoing a panniculectomy on 08/04/2019 with Dr. Arita Miss.  ~22 weeks PO (5 mo) Reports over all she is doing very well, but she's experiencing dull pain near her belly button  with movement or after taking off spanks and binder at night. She has started back exercising upper and lower body, but has been still avoiding core exercises. No scar tissue, fat necrosis, or other firmness felt on palpation. Tenderness when pressing of right superior/lateral area around belly button.  No signs of infection, redness, drainage, seroma/hematoma.  Patient denies fever, chest pain, shortness of breath, nausea/vomiting.  Incisions have healed very well, C/D/I.  Small dogear on left lateral end of the incision.  Patient does not need to wear spanks and binder any longer.  Recommend using ibuprofen, ice, heat to area of soreness especially in the evening before. May continue working out and core exercises are ok to start slowly as tolerated.  Patient to call office if pain does not resolve within the next few weeks or it increases. Follow up as needed.  The 21st Century Cures Act was signed into law in 2016 which includes the topic of electronic health records.  This provides immediate access to information in MyChart.  This includes consultation notes, operative notes, office notes, lab results and pathology reports.  If you have any questions about what you read please let us know at your next visit or call us at the office.  We are right here with you.

## 2020-01-05 ENCOUNTER — Other Ambulatory Visit: Payer: Self-pay

## 2020-01-05 ENCOUNTER — Encounter: Payer: Self-pay | Admitting: Plastic Surgery

## 2020-01-05 ENCOUNTER — Ambulatory Visit (INDEPENDENT_AMBULATORY_CARE_PROVIDER_SITE_OTHER): Payer: No Typology Code available for payment source | Admitting: Plastic Surgery

## 2020-01-05 DIAGNOSIS — Z9889 Other specified postprocedural states: Secondary | ICD-10-CM | POA: Diagnosis not present

## 2020-01-12 MED FILL — NP THYROID 120 MG TABLET: 120 | 30 days supply | Qty: 30 | Fill #3

## 2020-01-25 ENCOUNTER — Other Ambulatory Visit: Payer: Self-pay | Admitting: Family Medicine

## 2020-01-25 DIAGNOSIS — G43909 Migraine, unspecified, not intractable, without status migrainosus: Secondary | ICD-10-CM

## 2020-01-26 ENCOUNTER — Telehealth: Payer: Self-pay | Admitting: Internal Medicine

## 2020-01-26 ENCOUNTER — Ambulatory Visit (INDEPENDENT_AMBULATORY_CARE_PROVIDER_SITE_OTHER): Payer: No Typology Code available for payment source | Admitting: Cardiology

## 2020-01-26 ENCOUNTER — Other Ambulatory Visit: Payer: Self-pay

## 2020-01-26 ENCOUNTER — Encounter: Payer: Self-pay | Admitting: Cardiology

## 2020-01-26 ENCOUNTER — Other Ambulatory Visit: Payer: No Typology Code available for payment source

## 2020-01-26 VITALS — BP 125/70 | HR 88 | Temp 97.1°F | Ht 66.0 in | Wt 181.2 lb

## 2020-01-26 DIAGNOSIS — E079 Disorder of thyroid, unspecified: Secondary | ICD-10-CM

## 2020-01-26 DIAGNOSIS — R42 Dizziness and giddiness: Secondary | ICD-10-CM

## 2020-01-26 DIAGNOSIS — R002 Palpitations: Secondary | ICD-10-CM

## 2020-01-26 NOTE — Patient Instructions (Signed)

## 2020-01-26 NOTE — Telephone Encounter (Signed)
I have put in order today for pt to come in today for labs

## 2020-01-26 NOTE — Telephone Encounter (Signed)
Pt wanted to know if you can check her tsh instead of her going to dr. Talmage Nap to have this done. She is scheduled next Thursday at 8am

## 2020-01-26 NOTE — Telephone Encounter (Signed)
Ok to give 30 days but it looks like we need to discuss her migraine headaches. We can do a virtual visit for this sometime in the next month.

## 2020-01-26 NOTE — Telephone Encounter (Signed)
Is this okay to refill? 

## 2020-01-26 NOTE — Telephone Encounter (Signed)
Sent mychart message to pt. 

## 2020-01-26 NOTE — Addendum Note (Signed)
Addended by: Herminio Commons A on: 01/26/2020 10:43 AM   Modules accepted: Orders

## 2020-01-26 NOTE — Telephone Encounter (Signed)
Yes, I can check it.

## 2020-01-26 NOTE — Progress Notes (Signed)
Cardiology Office Note:    Date:  01/26/2020   ID:  Winn Jock, DOB [birth year], MRN [id number]  PCP:  Avanell Shackleton, NP-C  Cardiologist:  No primary care provider on file.  Electrophysiologist:  None   Referring MD: Avanell Shackleton, NP-C   Chief Complaint  Patient presents with  . Dizziness    History of Present Illness:    Donna Mccarthy is a 44 y.o. female with a hx of hypothyroidism, migraines, GERD who presents for follow-up.  She was referred by Beather Arbour, NP for evaluation of palpitations, initially seen on 10/26/2018  She had ED visit on 10/24/2019 with shortness of breath and lightheadedness.  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  EMS was called and she received IV fluids.  The following day she was seen by her PCP recommended that she go to the ED as orthostatics were positive.  In the ED, D-dimer was elevated, so she underwent a CTPA, which was negative for PE.  She does report that she has been having palpitations.  She describes as fluttering feeling in her chest, last 30 seconds or so.  Occurs about every other day.  Does occur with exertion.  Feels lightheaded during episodes.  She goes to the gym 4-5 times per week.  Does elliptical and treadmill, denies any exertional chest pain or dyspnea.  No smoking history.  Father has POTS.  Mother has hypertension.  Normal echo on 11/09/19.  No significant arrhythmias detected on Zio patch x7 days on 11/17/2019.  Reports had symptoms while wearing monitor.   Since last clinic visit, she states that she continues to have palpitations, but occurring about 1-2 times per week now.  States that she has been staying well-hydrated and this seems to be helping.  Palpitations typically last for about 15 seconds.  Also had brief episode of lightheadedness recently but did not pass out.  She is going to the gym 3 times per week.  Denies any exertional  symptoms.    Past Medical History:  Diagnosis Date  . Allergy   . Anemia   . Anxiety   . Arthritis    arthritis knees  . Blood transfusion without reported diagnosis   . GERD (gastroesophageal reflux disease)   . Hypothyroidism   . Neuromuscular disorder (HCC)   . Obesity (BMI 30-39.9) 01/05/2019    Past Surgical History:  Procedure Laterality Date  . DILATION AND CURETTAGE OF UTERUS    . PANNICULECTOMY N/A 08/04/2019   Procedure: PANNICULECTOMY;  Surgeon: [name], MD;  Location: [facility];  Service: Plastics;  Laterality: N/A;  2 hours only, please    Current Medications: Current Meds  Medication Sig  . Ascorbic Acid (VITAMIN C) 100 MG tablet Take 100 mg by mouth daily.  Marland Kitchen azelastine (ASTELIN) 0.1 % nasal spray Place 2 sprays into both nostrils 2 (two) times daily.  . cholecalciferol (VITAMIN D3) 25 MCG (1000 UNIT) tablet Take 2,000 Units by mouth daily.  . fluticasone (FLONASE) 50 MCG/ACT nasal spray Place 2 sprays into both nostrils daily.  Marland Kitchen levonorgestrel (MIRENA) 20 MCG/24HR IUD 1 each by Intrauterine route once.  . thyroid (ARMOUR THYROID) 120 MG tablet Take 120 mg by mouth daily before breakfast.  . topiramate (TOPAMAX) 25 MG tablet TAKE 3 TABLETS (75 MG TOTAL) BY MOUTH AT BEDTIME.  . vitamin B-12 (CYANOCOBALAMIN) 50 MCG tablet Take 50 mcg by  mouth daily.     Allergies:   Penicillins and Toradol [ketorolac tromethamine]   Social History   Socioeconomic History  . Marital status: Single    Spouse name: Not on file  . Number of children: 1  . Years of education: 82  . Highest education level: Associate degree: occupational, Scientist, product/process development, or vocational program  Occupational History  . Occupation: cma    Employer: [employer]  Tobacco Use  . Smoking status: Never Smoker  . Smokeless tobacco: Never Used  Vaping Use  . Vaping Use: Never used  Substance and Sexual Activity  . Alcohol use: No  . Drug use: No  . Sexual activity: Yes     Birth control/protection: I.U.D.  Other Topics Concern  . 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 Difficulty of Paying Living Expenses:   Food Insecurity:   . Worried About Programme researcher, broadcasting/film/video in the Last Year:   . Barista in the Last Year:   Transportation Needs:   . Freight forwarder (Medical):   Marland Kitchen Lack of Transportation (Non-Medical):   Physical Activity:   . Days of Exercise per Week:   . Minutes of Exercise per Session:   Stress:   . Feeling of Stress :   Social Connections:   . Frequency of Communication with Friends and Family:   . Frequency of Social Gatherings with Friends and Family:   . Attends Religious Services:   . Active Member of Clubs or Organizations:   . Attends Banker Meetings:   Marland Kitchen Marital Status:      Family History: The patient's family history includes Colon cancer in her maternal aunt; Diabetes in her maternal grandmother; Heart failure in her maternal grandmother; Hyperlipidemia in her father; Hypertension in her father and mother; Stroke in her father.  ROS:   Please see the history of present illness.     All other systems reviewed and are negative.  EKGs/Labs/Other Studies Reviewed:    The following studies were reviewed today:   EKG:  EKG is not ordered today.  The ekg ordered 3/10/21demonstrates normal sinus rhythm with sinus arrhythmia, rate 63  Recent Labs: 10/24/2019: ALT 13; BUN 7; Creatinine, Ser 0.53; Hemoglobin 11.9; Platelets 216; Potassium 3.8; Sodium 140; TSH 2.966  Recent Lipid Panel    Component Value Date/Time   CHOL 149 04/20/2019 1001   TRIG 50 04/20/2019 1001   HDL 48 04/20/2019 1001   CHOLHDL 3.1 04/20/2019 1001   LDLCALC 90 04/20/2019 1001    Physical Exam:     VS:  BP 125/70   Pulse 88   Temp (!) 97.1 F (36.2 C)   Ht 5\' 6"  (1.676 m)   Wt 181 lb 3.2 oz (82.2 kg)   SpO2 (!) 70%   BMI 29.25 kg/m     Wt Readings from Last 3 Encounters:  01/26/20 181 lb 3.2 oz (82.2 kg)  01/05/20 181 lb 3.2 oz (82.2 kg)  10/26/19 185 lb 9.6 oz (84.2 kg)  Orthostatics: Lying 114/70 59 Sitting 102/77 63 Standing 102/70 71  GEN:  Well nourished, well developed in no acute distress HEENT: Normal NECK: No JVD; No carotid bruits CARDIAC: RRR, no murmurs,  rubs, gallops RESPIRATORY:  Clear to auscultation without rales, wheezing or rhonchi  ABDOMEN: Soft, non-tender, non-distended MUSCULOSKELETAL:  No edema; No deformity  SKIN: Warm and dry NEUROLOGIC:  Alert and oriented x 3 PSYCHIATRIC:  Normal affect   ASSESSMENT:    1. Lightheadedness   2. Palpitations    PLAN:    Lightheadedness/palpitations/Near syncope: Suspect due to dehydration.  Normal orthostatics in clinic.  No structural heart disease on TTE.  Zio patch x7 days shows no significant arrhythmias.  No further cardiac work-up recommended at this time.  RTC in 1 year  Medication Adjustments/Labs and Tests Ordered: Current medicines are reviewed at length with the patient today.  Concerns regarding medicines are outlined above.  No orders of the defined types were placed in this encounter.  No orders of the defined types were placed in this encounter.   Patient Instructions  Medication Instructions:  Your physician recommends that you continue on your current medications as directed. Please refer to the Current Medication list given to you today.   Follow-Up: At [facility], you and your health needs are our priority.  As part of our continuing mission to provide you with exceptional heart care, we have created designated Provider Care Teams.  These Care Teams include your primary Cardiologist (physician) and Advanced Practice Providers (APPs -  Physician Assistants and Nurse  Practitioners) who all work together to provide you with the care you need, when you need it.  We recommend signing up for the patient portal called "MyChart".  Sign up information is provided on this After Visit Summary.  MyChart is used to connect with patients for Virtual Visits (Telemedicine).  Patients are able to view lab/test results, encounter notes, upcoming appointments, etc.  Non-urgent messages can be sent to your provider as well.   To learn more about what you can do with MyChart, go to [email or web address].    Your next appointment:   12 month(s)  The format for your next appointment:   In Person  Provider:   Oswaldo Milian, MD        Signed, Donato Heinz, MD  01/26/2020 5:45 PM    [place]

## 2020-01-27 LAB — TSH: TSH: 0.351 u[IU]/mL — ABNORMAL LOW (ref 0.450–4.500)

## 2020-01-30 ENCOUNTER — Other Ambulatory Visit (HOSPITAL_BASED_OUTPATIENT_CLINIC_OR_DEPARTMENT_OTHER): Payer: Self-pay | Admitting: Endocrinology

## 2020-02-02 ENCOUNTER — Other Ambulatory Visit: Payer: Self-pay

## 2020-02-02 ENCOUNTER — Encounter: Payer: Self-pay | Admitting: Family Medicine

## 2020-02-02 ENCOUNTER — Telehealth (INDEPENDENT_AMBULATORY_CARE_PROVIDER_SITE_OTHER): Payer: No Typology Code available for payment source | Admitting: Family Medicine

## 2020-02-02 VITALS — BP 116/62 | HR 65 | Temp 97.5°F | Wt 178.0 lb

## 2020-02-02 DIAGNOSIS — E079 Disorder of thyroid, unspecified: Secondary | ICD-10-CM

## 2020-02-02 DIAGNOSIS — G43909 Migraine, unspecified, not intractable, without status migrainosus: Secondary | ICD-10-CM

## 2020-02-02 DIAGNOSIS — E559 Vitamin D deficiency, unspecified: Secondary | ICD-10-CM | POA: Diagnosis not present

## 2020-02-02 MED ORDER — TOPIRAMATE 25 MG PO TABS: 75.0000 mg | ORAL_TABLET | Freq: Every day | ORAL | 0 refills | Status: DC

## 2020-02-02 NOTE — Progress Notes (Signed)
   Subjective:  Documentation for virtual audio and video telecommunications through caregility encounter:  The patient was located at home. 2 patient identifiers used.  The provider was located in the office. The patient did consent to this visit and is aware of possible charges through their insurance for this visit.  The other persons participating in this telemedicine service were none. Time spent on call was 13 minutes and in review of previous records 15 minutes total.  This virtual service is not related to other E/M service within previous 7 days.   Patient ID: Donna Mccarthy, female    DOB: January 31, 1976, 44 y.o.   MRN: [id number]  HPI Chief Complaint  Patient presents with  . other    follow up headache   She gets migraine headaches 1-2 times per month. Headaches have improved but she still has them around her menstrual cycles. Needs refills on Topamax.   States for the past 12 years she has been on Topamax.  Takes naproxen when she has a migraine headache.   Vitamin D deficiency- Taking vitamin D 2,000 IUs daily and has been since the end of April. Prescribed by Dr. Talmage Nap.   Thyroid function managed by Dr. Talmage Nap. States her dose of Amour thyroid was reduced recently by Dr. Talmage Nap.   No new concerns.   Denies fever, chills, dizziness, chest pain, palpitations, shortness of breath, abdominal pain, N/V/D, urinary symptoms, LE edema.    She has a Civil Service fast streamer.     Review of Systems Pertinent positives and negatives in the history of present illness.     Objective:   Physical Exam BP 116/62   Pulse 65   Temp (!) 97.5 F (36.4 C)   Wt 178 lb (80.7 kg)   SpO2 97%   BMI 28.73 kg/m   Alert and oriented and in no acute distress.  Normal speech, mood and thought process.      Assessment & Plan:  Migraine without status migrainosus, not intractable, unspecified migraine type  Thyroid disorder  Vitamin D deficiency  I will refill her Topamax.  Migraine headaches  seem to be pretty well controlled on this.  Discussed that there are newer medications out that we could consider if she would like. Dr. Talmage Nap is managing her thyroid disorder.  Dr. Talmage Nap also recently advised her to take a vitamin D supplement.  She would like to follow-up on her vitamin D level soon.  She will call and schedule a visit.

## 2020-02-10 MED FILL — NP THYROID 60 MG TABLET: 60 | 30 days supply | Qty: 60 | Fill #0

## 2020-02-14 ENCOUNTER — Encounter: Payer: Self-pay | Admitting: Family Medicine

## 2020-02-14 ENCOUNTER — Other Ambulatory Visit: Payer: Self-pay | Admitting: Family Medicine

## 2020-02-14 DIAGNOSIS — J0100 Acute maxillary sinusitis, unspecified: Secondary | ICD-10-CM

## 2020-02-14 MED ORDER — AZITHROMYCIN 250 MG PO TABS: ORAL_TABLET | ORAL | 0 refills | Status: DC

## 2020-02-14 MED ORDER — LEVOCETIRIZINE DIHYDROCHLORIDE 5 MG PO TABS: 5.0000 mg | ORAL_TABLET | Freq: Every evening | ORAL | 0 refills | Status: DC

## 2020-02-14 MED FILL — AZITHROMYCIN 250 MG TABLET: 250 | 5 days supply | Qty: 6 | Fill #0

## 2020-02-14 MED FILL — LEVOCETIRIZINE 5 MG TABLET: 5 | 30 days supply | Qty: 30 | Fill #0

## 2020-02-14 NOTE — Progress Notes (Signed)
Meds ordered this encounter  Medications  . azithromycin (ZITHROMAX) 250 MG tablet    Sig: Take 500 mg today and 250 mg daily for 4 days.    Dispense:  6 tablet    Refill:  0    Order Specific Question:   Supervising Provider    Answer:   Quentin Angst [id number]  . levocetirizine (XYZAL) 5 MG tablet    Sig: Take 1 tablet (5 mg total) by mouth every evening.    Dispense:  30 tablet    Refill:  0    Order Specific Question:   Supervising Provider    Answer:   Quentin Angst [id number]

## 2020-03-07 ENCOUNTER — Other Ambulatory Visit: Payer: Self-pay

## 2020-03-07 ENCOUNTER — Encounter: Payer: Self-pay | Admitting: Surgical

## 2020-03-07 ENCOUNTER — Ambulatory Visit (INDEPENDENT_AMBULATORY_CARE_PROVIDER_SITE_OTHER): Payer: No Typology Code available for payment source | Admitting: Surgical

## 2020-03-07 ENCOUNTER — Ambulatory Visit: Payer: No Typology Code available for payment source | Admitting: Plastic Surgery

## 2020-03-07 VITALS — BP 106/72 | HR 77 | Temp 99.1°F

## 2020-03-07 DIAGNOSIS — E65 Localized adiposity: Secondary | ICD-10-CM

## 2020-03-07 DIAGNOSIS — Z9889 Other specified postprocedural states: Secondary | ICD-10-CM | POA: Diagnosis not present

## 2020-03-07 NOTE — Progress Notes (Signed)
   Subjective:     Patient ID: Donna Mccarthy, female    DOB: 08/20/1975, 44 y.o.   MRN: [id number]  Chief Complaint  Patient presents with  . Follow-up    HPI: The patient is a 44 y.o. female here for follow-up after panniculectomy on 08/04/2019 with Dr. Arita Miss.  Patient is 7 months postop.  Donna Mccarthy is doing well today.  She reports that she has been exercising and has been slowly increasing the intensity and this has been going well.  She is very pleased.  She reports she has continued to wear the abdominal binder/compression garment when she is working due to heavy lifting/pulling/pushing at work.  She is interested in surgical intervention for her bilateral upper arm excess skin.  She reports that due to the excess skin she notices a lot of rash/chafing/irritation between her inner arm and axillary region.  Today on exam she has a rash along the proximal aspect of her right inner arm.   Review of Systems  Constitutional: Negative.   Gastrointestinal: Negative for abdominal distention.  Skin: Negative for wound.     Objective:   Vital Signs BP 106/72 (BP Location: Left Arm, Patient Position: Sitting, Cuff Size: Large)   Pulse 77   Temp 99.1 F (37.3 C) (Oral)   SpO2 100%  Vital Signs and Nursing Note Reviewed Chaperone present Physical Exam Constitutional:      Appearance: She is not diaphoretic.  HENT:     Head: Normocephalic and atraumatic.  Cardiovascular:     Rate and Rhythm: Normal rate.  Pulmonary:     Effort: Pulmonary effort is normal.  Abdominal:    Musculoskeletal:        General: Normal range of motion.  Skin:    General: Skin is warm and dry.     Coloration: Skin is not pale.     Findings: No erythema or rash.       Neurological:     Mental Status: She is alert and oriented to person, place, and time.     Gait: Gait is intact.  Psychiatric:        Mood and Affect: Mood and affect normal.     Assessment/Plan:     ICD-10-CM   1. S/P  panniculectomy  Z98.890   2. Abdominal pannus  E65     in regards to her recovery from her panniculectomy, Donna Mccarthy is doing really well.  She has great contour.  She does have a little bit of hypertrophic scarring along the midline of her lower abdomen, she endorses a family history of keloiding.  I discussed with her the use of silicone scar sheets.  If she notices any worsening of the scar to please call us.  We also discussed her bilateral upper arm excess skin, she is interested in surgical intervention for this.  She reports that it limits her daily activities and she also develops rashes from the excess skin.  Recommend calling with questions or concerns. Follow up as needed.  Will discuss with scheduling team to submit for bilateral brachioplasty through insurance.  Patient may need formal consult to further discuss with Dr. Arita Miss.  Pictures were obtained of the patient and placed in the chart with the patient's or guardian's permission.   Kermit Balo Jaylene Arrowood, PA-C 03/07/2020, 10:04 AM

## 2020-03-14 MED FILL — TOPIRAMATE 25 MG TAB: 25 | 30 days supply | Qty: 90 | Fill #0

## 2020-03-21 ENCOUNTER — Encounter: Payer: Self-pay | Admitting: Family Medicine

## 2020-03-22 MED FILL — NP THYROID 60 MG TABLET: 60 | 30 days supply | Qty: 60 | Fill #1

## 2020-03-28 ENCOUNTER — Ambulatory Visit: Payer: No Typology Code available for payment source | Admitting: Family Medicine

## 2020-03-28 ENCOUNTER — Ambulatory Visit (INDEPENDENT_AMBULATORY_CARE_PROVIDER_SITE_OTHER): Payer: No Typology Code available for payment source | Admitting: Family Medicine

## 2020-03-28 ENCOUNTER — Other Ambulatory Visit: Payer: Self-pay

## 2020-03-28 VITALS — BP 122/82 | HR 61 | Temp 98.8°F | Wt 184.6 lb

## 2020-03-28 DIAGNOSIS — R1310 Dysphagia, unspecified: Secondary | ICD-10-CM | POA: Diagnosis not present

## 2020-03-28 DIAGNOSIS — K449 Diaphragmatic hernia without obstruction or gangrene: Secondary | ICD-10-CM

## 2020-03-28 DIAGNOSIS — R1319 Other dysphagia: Secondary | ICD-10-CM

## 2020-03-28 MED ORDER — DEXILANT 60 MG PO CPDR: 60.0000 mg | DELAYED_RELEASE_CAPSULE | Freq: Every day | ORAL | 0 refills | Status: DC

## 2020-03-28 NOTE — Progress Notes (Signed)
   Subjective:    Patient ID: Donna Mccarthy, female    DOB: [birth year], 44 y.o.   MRN: [id number]  HPI She complains of a 3-week history of difficulty with feeling of food getting stuck and burning sensation in her mid chest area.  She says this is more common with solids than with liquids but it can occur with both.  She has a previous history of hiatus hernia.  She has been using regular doses of either Protonix or omeprazole with no benefits.   Review of Systems     Objective:   Physical Exam Alert and in no distress.  Cardiac exam shows regular rhythm without murmurs or gallops.  Lungs are clear to auscultation.  Abdominal exam shows no tenderness to palpation.       Assessment & Plan:  Esophageal dysphagia  HH (hiatus hernia) I explained that the symptoms are either hiatus hernia related or possible stricture and since he has not responded to a PPI, referral for EGD is appropriate.  Sample of Dexilant given.

## 2020-03-29 ENCOUNTER — Telehealth (INDEPENDENT_AMBULATORY_CARE_PROVIDER_SITE_OTHER): Payer: Self-pay | Admitting: *Deleted

## 2020-03-29 ENCOUNTER — Encounter (INDEPENDENT_AMBULATORY_CARE_PROVIDER_SITE_OTHER): Payer: Self-pay | Admitting: Gastroenterology

## 2020-03-29 ENCOUNTER — Ambulatory Visit (INDEPENDENT_AMBULATORY_CARE_PROVIDER_SITE_OTHER): Payer: No Typology Code available for payment source | Admitting: Gastroenterology

## 2020-03-29 ENCOUNTER — Encounter (INDEPENDENT_AMBULATORY_CARE_PROVIDER_SITE_OTHER): Payer: Self-pay | Admitting: *Deleted

## 2020-03-29 ENCOUNTER — Other Ambulatory Visit (INDEPENDENT_AMBULATORY_CARE_PROVIDER_SITE_OTHER): Payer: Self-pay | Admitting: *Deleted

## 2020-03-29 DIAGNOSIS — R1013 Epigastric pain: Secondary | ICD-10-CM

## 2020-03-29 DIAGNOSIS — R1310 Dysphagia, unspecified: Secondary | ICD-10-CM | POA: Diagnosis not present

## 2020-03-29 DIAGNOSIS — R112 Nausea with vomiting, unspecified: Secondary | ICD-10-CM | POA: Diagnosis not present

## 2020-03-29 DIAGNOSIS — R110 Nausea: Secondary | ICD-10-CM

## 2020-03-29 MED FILL — NP THYROID 30 MG TABLET: 30 | 30 days supply | Qty: 30 | Fill #0

## 2020-03-29 NOTE — Telephone Encounter (Signed)
Ok to schedule.  Thanks,  Idan Prime Castaneda Mayorga, MD Gastroenterology and Hepatology [facility]  

## 2020-03-29 NOTE — Progress Notes (Signed)
Donna Mccarthy, M.D. Gastroenterology & Hepatology [facility] [address] [place], [place] [address] Primary Care Physician: Avanell Shackleton, NP-C [address] [place] [address]  Referring MD: PCP  I will communicate my assessment and recommendations to the referring MD via EMR. Note: Occasional unusual wording and randomly placed punctuation marks may result from the use of speech recognition technology to transcribe this document"  Chief Complaint: Dysphagia and retrosternal discomfort  History of Present Illness: Donna Mccarthy is a 44 y.o. female with past medical history of anxiety, GERD, hypothyroidism, who presents for evaluation of dysphagia and retrosternal discomfort.  Patient states that for the last month she has presented recurrent episodes of dysphagia to both solids and liquids. She feels that food feels getting caught in her chest and may take up to a day to go down sometimes. She feels when she drinks liquids to make it go down "it trickles past it".  She has not noticed any difference between the intake of solids or liquids.  Even if she does not eat any food, she feels some discomfort in the retrosternal area constantly that radiates to her back.  She endorses that she has had to vomit her food every couple of weeks as she has felt persistent discomfort. She has had dry heaves and nausea recently as well but has not taken any medication for this. She is also concerned as her symptoms have woken her up in the middle of the night. She has been taking Protonix 40 mg a week and a half ago, while she also started omeprazole 20-40 mg last week. She has not felt any improvement. She was given a Dexilant 60 mg sample yesterday by her PCP, which she just started - did not have any major improvement.  States that she had similar symptoms in 2014 and underwent an EGD - was told she had a hiatal hernia. Received  pantoprazole for a couple of months and her symptoms resolved. She stopped the medication at that time. She remembers having a milder episode in 10/2018, for which she took a course of omeprazole with resolution of the symptoms.   The patient denies having any heartburn, odynophagia, fever, chills, hematochezia, melena, hematemesis, abdominal distention, abdominal pain, diarrhea, jaundice, pruritus or unintentional weight loss.  Last EGD:as above at [facility] per the patient, no report is available Last Colonoscopy:never  FHx: mother Crohn's disease, neg for any other gastrointestinal/liver disease, uncle colon cancer, grandmother unknown type of cancer Social: neg smoking, alcohol or illicit drug use Surgical: D/C and abdominal surgery  Past Medical History: Past Medical History:  Diagnosis Date  . Allergy   . Anemia   . Anxiety   . Arthritis    arthritis knees  . Blood transfusion without reported diagnosis   . GERD (gastroesophageal reflux disease)   . Hypothyroidism   . Neuromuscular disorder (HCC)   . Obesity (BMI 30-39.9) 01/05/2019    Past Surgical History: Past Surgical History:  Procedure Laterality Date  . DILATION AND CURETTAGE OF UTERUS    . PANNICULECTOMY N/A 08/04/2019   Procedure: PANNICULECTOMY;  Surgeon: [name], MD;  Location: [facility];  Service: Plastics;  Laterality: N/A;  2 hours only, please    Family History: Family History  Problem Relation Age of Onset  . Hypertension Mother   . Hypertension Father   . Stroke Father        TIA  . Hyperlipidemia Father   .  Colon cancer Maternal Aunt   . Diabetes Maternal Grandmother   . Heart failure Maternal Grandmother     Social History: Social History   Tobacco Use  Smoking Status Never Smoker  Smokeless Tobacco Never Used   Social History   Substance and Sexual Activity  Alcohol Use No   Social History   Substance and Sexual Activity  Drug Use No     Allergies: Allergies  Allergen Reactions  . Penicillins Other (See Comments)    Mother said she had a rash  . Toradol [Ketorolac Tromethamine] Other (See Comments)    "felt like whole body was on fire"    Medications: Current Outpatient Medications  Medication Sig Dispense Refill  . Ascorbic Acid (VITAMIN C) 100 MG tablet Take 100 mg by mouth daily.    . cholecalciferol (VITAMIN D3) 25 MCG (1000 UNIT) tablet Take 2,000 Units by mouth daily.    Marland Kitchen dexlansoprazole (DEXILANT) 60 MG capsule Take 1 capsule (60 mg total) by mouth daily. 8 capsule 0  . levonorgestrel (MIRENA) 20 MCG/24HR IUD 1 each by Intrauterine route once.    . thyroid (ARMOUR THYROID) 120 MG tablet Take 120 mg by mouth daily before breakfast.    . topiramate (TOPAMAX) 25 MG tablet Take 3 tablets (75 mg total) by mouth at bedtime. 90 tablet 0  . vitamin B-12 (CYANOCOBALAMIN) 50 MCG tablet Take 50 mcg by mouth daily.    . APPLE CIDER VINEGAR PO Take 2 each by mouth. (Patient not taking: Reported on 03/28/2020)    . azelastine (ASTELIN) 0.1 % nasal spray Place 2 sprays into both nostrils 2 (two) times daily. (Patient not taking: Reported on 03/07/2020) 30 mL 0  . azithromycin (ZITHROMAX) 250 MG tablet Take 500 mg today and 250 mg daily for 4 days. (Patient not taking: Reported on 03/28/2020) 6 tablet 0  . fluticasone (FLONASE) 50 MCG/ACT nasal spray Place 2 sprays into both nostrils daily. (Patient not taking: Reported on 03/29/2020) 1 g 0  . levocetirizine (XYZAL) 5 MG tablet Take 1 tablet (5 mg total) by mouth every evening. (Patient not taking: Reported on 03/07/2020) 30 tablet 0   No current facility-administered medications for this visit.    Review of Systems: GENERAL: negative for malaise, night sweats HEENT: No changes in hearing or vision, no nose bleeds or other nasal problems. NECK: Negative for lumps, goiter, pain and significant neck swelling RESPIRATORY: Negative for cough, wheezing CARDIOVASCULAR: Negative  for chest pain, leg swelling, palpitations, orthopnea GI: SEE HPI MUSCULOSKELETAL: Negative for joint pain or swelling, back pain, and muscle pain. SKIN: Negative for lesions, rash PSYCH: Negative for sleep disturbance, mood disorder and recent psychosocial stressors. HEMATOLOGY Negative for prolonged bleeding, bruising easily, and swollen nodes. ENDOCRINE: Negative for cold or heat intolerance, polyuria, polydipsia and goiter. NEURO: negative for tremor, gait imbalance, syncope and seizures. The remainder of the review of systems is noncontributory.   Physical Exam: BP 103/67 (BP Location: Right Arm, Patient Position: Sitting, Cuff Size: Large)   Pulse 62   Temp 98.8 F (37.1 C) (Oral)   Ht 5\' 6"  (1.676 m)   Wt 184 lb (83.5 kg)   BMI 29.70 kg/m  GENERAL: The patient is AO x3, in no acute distress. HEENT: Head is normocephalic and atraumatic. EOMI are intact. Mouth is well hydrated and without lesions. NECK: Supple. No masses LUNGS: Clear to auscultation. No presence of rhonchi/wheezing/rales. Adequate chest expansion HEART: RRR, normal s1 and s2. ABDOMEN: Soft, nontender, no guarding, no peritoneal signs,  and nondistended. BS +. No masses. EXTREMITIES: Without any cyanosis, clubbing, rash, lesions or edema. NEUROLOGIC: AOx3, no focal motor deficit. SKIN: no jaundice, no rashes  Imaging/Labs: as above  I personally reviewed and interpreted the available labs, imaging and endoscopic files.  Impression and Plan: Donna Mccarthy is a 44 y.o. female with past medical history of anxiety, GERD, hypothyroidism, who presents for evaluation of dysphagia and retrosternal discomfort.  The patient has presented recurrent symptoms of dysphagia both solids and liquids of unclear etiology.  She has not responded to the use of PPI, even with the use of Dexilant recently.  This is unusual for GERD alone.  However this has significantly affected her lifestyle and food intake.  This should be  further investigated with an EGD with potential dilation of any stricture if present.  If this is not present, may need to perform esophageal biopsies to rule out EOE.  She can continue the Dexilant for now.  I explained to her that if this investigations are negative, there is a possibility her symptoms could be related to esophageal hypersensitivity versus functional heartburn that will require for studies to characterize her symptomatology.  The patient understood and agreed.  - Schedule EGD with possible dilation +/- esophageal biopsies -Continue Dexilant 60 mg every day  All questions were answered.      Donna Blazing, MD Gastroenterology and Hepatology [facility]

## 2020-03-29 NOTE — Telephone Encounter (Signed)
PCP/Referring MD: henson                        Procedure: egd (room 1)  Reason/Indication: epigastric pain, nausea  Has patient had this procedure before? yes, years ago  If yes, when, by whom & where?   Is there a family history of colon cancer?   If yes, who & what age diagnosed?   Is patient diabetic? no  Does patient have prosthetic heart valve or mechanical valve? no  Does patient have a pacemaker/defibrillator? no  Has patient ever had endocarditis/atrial fibrillation? no  Is patient on oxygen? no  Has patient had joint replacement with last 12 months? no  Is patient constipated or take laxatives?   Does patient have a history of alcohol/drug use? no  Is patient on Coumadin, Plavix, ASA or any other blood thinner? no  Medications: armour thyroid 120 mg daily,  topromax 25 mg daily, vit d daily, vit c daily, vit b12 daily,   Allergies: toradol, pcn  Pharmacy:   Medication Adjustment:   PROCEDURE DATE & TIME: 04/03/20

## 2020-03-29 NOTE — Patient Instructions (Signed)
Schedule EGD  Continue with Dexilant 60 mg qday

## 2020-04-02 ENCOUNTER — Encounter (HOSPITAL_COMMUNITY)
Admission: RE | Admit: 2020-04-02 | Discharge: 2020-04-02 | Disposition: A | Discharge status: Home / Self Care | Payer: No Typology Code available for payment source | Source: Ambulatory Visit | Attending: Gastroenterology | Admitting: Gastroenterology

## 2020-04-02 ENCOUNTER — Other Ambulatory Visit: Payer: Self-pay

## 2020-04-02 ENCOUNTER — Other Ambulatory Visit (HOSPITAL_COMMUNITY)
Admission: RE | Admit: 2020-04-02 | Discharge: 2020-04-02 | Disposition: A | Discharge status: Home / Self Care | Payer: No Typology Code available for payment source | Source: Ambulatory Visit | Attending: Gastroenterology | Admitting: Gastroenterology

## 2020-04-02 ENCOUNTER — Other Ambulatory Visit (INDEPENDENT_AMBULATORY_CARE_PROVIDER_SITE_OTHER): Payer: Self-pay | Admitting: *Deleted

## 2020-04-02 DIAGNOSIS — Z01812 Encounter for preprocedural laboratory examination: Secondary | ICD-10-CM | POA: Diagnosis not present

## 2020-04-02 DIAGNOSIS — R110 Nausea: Secondary | ICD-10-CM

## 2020-04-02 DIAGNOSIS — Z20822 Contact with and (suspected) exposure to covid-19: Secondary | ICD-10-CM | POA: Insufficient documentation

## 2020-04-02 DIAGNOSIS — R1013 Epigastric pain: Secondary | ICD-10-CM

## 2020-04-02 LAB — CBC WITH DIFFERENTIAL/PLATELET
Abs Immature Granulocytes: 0.01 10*3/uL (ref 0.00–0.07)
Basophils Absolute: 0 10*3/uL (ref 0.0–0.1)
Basophils Relative: 1 %
Eosinophils Absolute: 0.1 10*3/uL (ref 0.0–0.5)
Eosinophils Relative: 2 %
HCT: 36 % (ref 36.0–46.0)
Hemoglobin: 11 g/dL — ABNORMAL LOW (ref 12.0–15.0)
Immature Granulocytes: 0 %
Lymphocytes Relative: 46 %
Lymphs Abs: 1.8 10*3/uL (ref 0.7–4.0)
MCH: 26.6 pg (ref 26.0–34.0)
MCHC: 30.6 g/dL (ref 30.0–36.0)
MCV: 87.2 fL (ref 80.0–100.0)
Monocytes Absolute: 0.3 10*3/uL (ref 0.1–1.0)
Monocytes Relative: 7 %
Neutro Abs: 1.7 10*3/uL (ref 1.7–7.7)
Neutrophils Relative %: 44 %
Platelets: 190 10*3/uL (ref 150–400)
RBC: 4.13 MIL/uL (ref 3.87–5.11)
RDW: 14.4 % (ref 11.5–15.5)
WBC: 3.9 10*3/uL — ABNORMAL LOW (ref 4.0–10.5)
nRBC: 0 % (ref 0.0–0.2)

## 2020-04-02 LAB — HCG, SERUM, QUALITATIVE: Preg, Serum: NEGATIVE

## 2020-04-02 LAB — SARS CORONAVIRUS 2 (TAT 6-24 HRS): SARS Coronavirus 2: NEGATIVE

## 2020-04-03 ENCOUNTER — Other Ambulatory Visit: Payer: Self-pay

## 2020-04-03 ENCOUNTER — Ambulatory Visit (HOSPITAL_COMMUNITY): Payer: No Typology Code available for payment source | Admitting: Anesthesiology

## 2020-04-03 ENCOUNTER — Encounter (HOSPITAL_COMMUNITY)
Admission: RE | Disposition: A | Discharge status: Home / Self Care | Payer: Self-pay | Source: Home / Self Care | Attending: Gastroenterology

## 2020-04-03 ENCOUNTER — Ambulatory Visit (HOSPITAL_COMMUNITY)
Admission: RE | Admit: 2020-04-03 | Discharge: 2020-04-03 | Disposition: A | Discharge status: Home / Self Care | Payer: No Typology Code available for payment source | Attending: Gastroenterology | Admitting: Gastroenterology

## 2020-04-03 ENCOUNTER — Encounter (HOSPITAL_COMMUNITY): Payer: Self-pay | Admitting: Gastroenterology

## 2020-04-03 DIAGNOSIS — E039 Hypothyroidism, unspecified: Secondary | ICD-10-CM | POA: Insufficient documentation

## 2020-04-03 DIAGNOSIS — Z79890 Hormone replacement therapy (postmenopausal): Secondary | ICD-10-CM | POA: Insufficient documentation

## 2020-04-03 DIAGNOSIS — Z79899 Other long term (current) drug therapy: Secondary | ICD-10-CM | POA: Insufficient documentation

## 2020-04-03 DIAGNOSIS — K2971 Gastritis, unspecified, with bleeding: Secondary | ICD-10-CM | POA: Diagnosis not present

## 2020-04-03 DIAGNOSIS — K219 Gastro-esophageal reflux disease without esophagitis: Secondary | ICD-10-CM | POA: Diagnosis not present

## 2020-04-03 DIAGNOSIS — E669 Obesity, unspecified: Secondary | ICD-10-CM | POA: Diagnosis not present

## 2020-04-03 DIAGNOSIS — R1013 Epigastric pain: Secondary | ICD-10-CM

## 2020-04-03 DIAGNOSIS — K3189 Other diseases of stomach and duodenum: Secondary | ICD-10-CM

## 2020-04-03 DIAGNOSIS — R1310 Dysphagia, unspecified: Secondary | ICD-10-CM | POA: Diagnosis not present

## 2020-04-03 DIAGNOSIS — M1990 Unspecified osteoarthritis, unspecified site: Secondary | ICD-10-CM | POA: Diagnosis not present

## 2020-04-03 DIAGNOSIS — Z880 Allergy status to penicillin: Secondary | ICD-10-CM | POA: Insufficient documentation

## 2020-04-03 DIAGNOSIS — R110 Nausea: Secondary | ICD-10-CM

## 2020-04-03 DIAGNOSIS — Z888 Allergy status to other drugs, medicaments and biological substances status: Secondary | ICD-10-CM | POA: Diagnosis not present

## 2020-04-03 DIAGNOSIS — K2951 Unspecified chronic gastritis with bleeding: Secondary | ICD-10-CM | POA: Diagnosis not present

## 2020-04-03 DIAGNOSIS — Z6829 Body mass index (BMI) 29.0-29.9, adult: Secondary | ICD-10-CM | POA: Insufficient documentation

## 2020-04-03 DIAGNOSIS — Z793 Long term (current) use of hormonal contraceptives: Secondary | ICD-10-CM | POA: Diagnosis not present

## 2020-04-03 HISTORY — PX: ESOPHAGOGASTRODUODENOSCOPY (EGD) WITH PROPOFOL: SHX5813

## 2020-04-03 HISTORY — PX: BIOPSY: SHX5522

## 2020-04-03 SURGERY — ESOPHAGOGASTRODUODENOSCOPY (EGD) WITH PROPOFOL
Anesthesia: General

## 2020-04-03 MED ORDER — LIDOCAINE VISCOUS HCL 2 % MT SOLN
OROMUCOSAL | Status: AC
  Filled 2020-04-03: qty 15

## 2020-04-03 MED ORDER — PROPOFOL 500 MG/50ML IV EMUL
INTRAVENOUS | Status: DC | PRN
  Administered 2020-04-03: 200 ug/kg/min via INTRAVENOUS
  Administered 2020-04-03: 150 ug/kg/min via INTRAVENOUS

## 2020-04-03 MED ORDER — CHLORHEXIDINE GLUCONATE CLOTH 2 % EX PADS: 6.0000 | MEDICATED_PAD | Freq: Once | CUTANEOUS | Status: DC

## 2020-04-03 MED ORDER — LACTATED RINGERS IV SOLN: Freq: Once | INTRAVENOUS | Status: AC

## 2020-04-03 MED ORDER — STERILE WATER FOR IRRIGATION IR SOLN
Status: DC | PRN
  Administered 2020-04-03: 1.5 mL

## 2020-04-03 MED ORDER — GLYCOPYRROLATE 0.2 MG/ML IJ SOLN
INTRAMUSCULAR | Status: AC
  Filled 2020-04-03: qty 1

## 2020-04-03 MED ORDER — PROPOFOL 10 MG/ML IV BOLUS
INTRAVENOUS | Status: AC
  Filled 2020-04-03: qty 60

## 2020-04-03 MED ORDER — LIDOCAINE 2% (20 MG/ML) 5 ML SYRINGE
INTRAMUSCULAR | Status: DC | PRN
Start: 2020-04-03 — End: 2020-04-03
  Administered 2020-04-03: 40 mg via INTRAVENOUS

## 2020-04-03 MED ORDER — LIDOCAINE VISCOUS HCL 2 % MT SOLN
15.0000 mL | Freq: Once | OROMUCOSAL | Status: AC
  Administered 2020-04-03: 15 mL via OROMUCOSAL

## 2020-04-03 MED ORDER — PROPOFOL 10 MG/ML IV BOLUS
INTRAVENOUS | Status: DC | PRN
  Administered 2020-04-03: 20 mg via INTRAVENOUS
  Administered 2020-04-03 (×3): 50 mg via INTRAVENOUS
  Administered 2020-04-03: 20 mg via INTRAVENOUS
  Administered 2020-04-03: 50 mg via INTRAVENOUS

## 2020-04-03 MED ORDER — GLYCOPYRROLATE 0.2 MG/ML IJ SOLN
0.2000 mg | Freq: Once | INTRAMUSCULAR | Status: AC
  Administered 2020-04-03: 0.2 mg via INTRAVENOUS

## 2020-04-03 MED ORDER — LACTATED RINGERS IV SOLN: INTRAVENOUS | Status: DC | PRN

## 2020-04-03 NOTE — Op Note (Signed)
[facility] Patient Name: Donna Mccarthy Procedure Date: 04/03/2020 2:22 PM MRN: [id number] Date of Birth: 07/20/1976 Attending MD: Katrinka Blazing ,  CSN: [id number] Age: 44 Admit Type: Outpatient Procedure:                Upper GI endoscopy Indications:              Dysphagia, nausea Providers:                Katrinka Blazing, Buel Ream. Thomasena Edis RN, RN, Sheran Fava, Burke Keels, Technician Referring MD:              Medicines:                Monitored Anesthesia Care Complications:            No immediate complications. Estimated Blood Loss:     Estimated blood loss: none. Procedure:                Pre-Anesthesia Assessment:                           - Prior to the procedure, a History and Physical                            was performed, and patient medications, allergies                            and sensitivities were reviewed. The patient's                            tolerance of previous anesthesia was reviewed.                           - The risks and benefits of the procedure and the                            sedation options and risks were discussed with the                            patient. All questions were answered and informed                            consent was obtained.                           - ASA Grade Assessment: I - A normal, healthy                            patient.                           After obtaining informed consent, the endoscope was                            passed under direct vision. Throughout the  procedure, the patient's blood pressure, pulse, and                            oxygen saturations were monitored continuously. The                            [id number]-H190 ([id number]) scope was introduced through the                            mouth, and advanced to the second part of duodenum.                            The upper GI endoscopy was accomplished without                             difficulty. The patient tolerated the procedure                            well. Scope In: 2:37:10 PM Scope Out: 2:53:04 PM Total Procedure Duration: 0 hours 15 minutes 54 seconds  Findings:      The Z-line was regular and was found 38 cm from the incisors.      The examined esophagus was normal. Biopsies were obtained from the       proximal and distal esophagus with cold forceps for histology of       eosinophilic esophagitis.      Patchy inflammation with hemorrhage characterized by erythema and       friability was found in the gastric antrum, specially in the pyloric       area. Biopsies were taken with a cold forceps for Helicobacter pylori       testing.      Three localized 4 mm erosions with no bleeding and no stigmata of recent       bleeding were found in the gastric antrum.      The examined duodenum was normal. Impression:               - Z-line regular, 38 cm from the incisors.                           - Normal esophagus. Biopsied.                           - Gastritis with hemorrhage. Biopsied.                           - Erosive gastropathy with no bleeding and no                            stigmata of recent bleeding.                           - Normal examined duodenum. Moderate Sedation:      Per Anesthesia Care Recommendation:           - Discharge patient to home (ambulatory).                           -  Resume previous diet.                           - Use Dexilant (dexlansoprazole) 60 mg PO daily for                            3 months.                           - Await pathology results. Procedure Code(s):        --- Professional ---                           [phone], GC, Esophagogastroduodenoscopy, flexible,                            transoral; with biopsy, single or multiple Diagnosis Code(s):        --- Professional ---                           K29.71, Gastritis, unspecified, with bleeding                           K31.89, Other diseases  of stomach and duodenum                           R13.10, Dysphagia, unspecified CPT copyright 2019 American Medical Association. All rights reserved. The codes documented in this report are preliminary and upon coder review may  be revised to meet current compliance requirements. Katrinka Blazing, MD Katrinka Blazing,  04/03/2020 3:06:27 PM This report has been signed electronically. Number of Addenda: 0

## 2020-04-03 NOTE — H&P (Signed)
Donna Mccarthy is an 44 y.o. female.   Chief Complaint: Dysphagia, retrosternal discomfort and nausea HPI: 44 y.o. female with past medical history of anxiety, GERD, hypothyroidism, who comes to the hospital for evaluation of dysphagia and retrosternal discomfort. Patient reported persistent thorat and chest discomfort with food even if not eating food. Did not improve with PPIs.   Past Medical History:  Diagnosis Date  . Allergy   . Anemia   . Anxiety   . Arthritis    arthritis knees  . Blood transfusion without reported diagnosis   . GERD (gastroesophageal reflux disease)   . Hypothyroidism   . Neuromuscular disorder (HCC)   . Obesity (BMI 30-39.9) 01/05/2019    Past Surgical History:  Procedure Laterality Date  . DILATION AND CURETTAGE OF UTERUS    . PANNICULECTOMY N/A 08/04/2019   Procedure: PANNICULECTOMY;  Surgeon: [name], MD;  Location: [facility];  Service: Plastics;  Laterality: N/A;  2 hours only, please    Family History  Problem Relation Age of Onset  . Hypertension Mother   . Hypertension Father   . Stroke Father        TIA  . Hyperlipidemia Father   . Colon cancer Maternal Aunt   . Diabetes Maternal Grandmother   . Heart failure Maternal Grandmother    Social History:  reports that she has never smoked. She has never used smokeless tobacco. She reports that she does not drink alcohol and does not use drugs.  Allergies:  Allergies  Allergen Reactions  . Penicillins Other (See Comments)    Mother said she had a rash  . Toradol [Ketorolac Tromethamine] Other (See Comments)    "felt like whole body was on fire"    Medications Prior to Admission  Medication Sig Dispense Refill  . Ascorbic Acid (VITAMIN C) 100 MG tablet Take 100 mg by mouth daily.    . cholecalciferol (VITAMIN D3) 25 MCG (1000 UNIT) tablet Take 2,000 Units by mouth daily.    Marland Kitchen dexlansoprazole (DEXILANT) 60 MG capsule Take 1 capsule (60 mg total) by mouth daily. 8  capsule 0  . levonorgestrel (MIRENA) 20 MCG/24HR IUD 1 each by Intrauterine route once.    . NP THYROID 30 MG tablet Take 30 mg by mouth daily before breakfast.     . thyroid (ARMOUR THYROID) 120 MG tablet Take 120 mg by mouth daily before breakfast.    . topiramate (TOPAMAX) 25 MG tablet Take 3 tablets (75 mg total) by mouth at bedtime. 90 tablet 0  . vitamin B-12 (CYANOCOBALAMIN) 50 MCG tablet Take 50 mcg by mouth daily.    . APPLE CIDER VINEGAR PO Take 2 each by mouth. (Patient not taking: Reported on 03/28/2020)    . azelastine (ASTELIN) 0.1 % nasal spray Place 2 sprays into both nostrils 2 (two) times daily. (Patient not taking: Reported on 03/07/2020) 30 mL 0  . azithromycin (ZITHROMAX) 250 MG tablet Take 500 mg today and 250 mg daily for 4 days. (Patient not taking: Reported on 03/28/2020) 6 tablet 0  . fluticasone (FLONASE) 50 MCG/ACT nasal spray Place 2 sprays into both nostrils daily. (Patient not taking: Reported on 03/29/2020) 1 g 0  . levocetirizine (XYZAL) 5 MG tablet Take 1 tablet (5 mg total) by mouth every evening. (Patient taking differently: Take 5 mg by mouth daily as needed for allergies. ) 30 tablet 0    Results for orders placed or performed during the hospital encounter of 04/02/20 (from the  past 48 hour(s))  SARS CORONAVIRUS 2 (TAT 6-24 HRS) Nasopharyngeal Nasopharyngeal Swab     Status: None   Collection Time: 04/02/20  8:53 AM   Specimen: Nasopharyngeal Swab  Result Value Ref Range   SARS Coronavirus 2 NEGATIVE NEGATIVE    Comment: (NOTE) SARS-CoV-2 target nucleic acids are NOT DETECTED.  The SARS-CoV-2 RNA is generally detectable in upper and lower respiratory specimens during the acute phase of infection. Negative results do not preclude SARS-CoV-2 infection, do not rule out co-infections with other pathogens, and should not be used as the sole basis for treatment or other patient management decisions. Negative results must be combined with clinical  observations, patient history, and epidemiological information. The expected result is Negative.  Fact Sheet for Patients: HairSlick.no  Fact Sheet for Healthcare Providers: quierodirigir.com  This test is not yet approved or cleared by the [place] FDA and  has been authorized for detection and/or diagnosis of SARS-CoV-2 by FDA under an Emergency Use Authorization (EUA). This EUA will remain  in effect (meaning this test can be used) for the duration of the COVID-19 declaration under Se ction 564(b)(1) of the Act, 21 U.S.C. section 360bbb-3(b)(1), unless the authorization is terminated or revoked sooner.  Performed at [facility], [address], [place], [place] [address]   CBC with Differential/Platelet     Status: Abnormal   Collection Time: 04/02/20  9:02 AM  Result Value Ref Range   WBC 3.9 (L) 4.0 - 10.5 K/uL   RBC 4.13 3.87 - 5.11 MIL/uL   Hemoglobin 11.0 (L) 12.0 - 15.0 g/dL   HCT 00.9 36 - 46 %   MCV 87.2 80.0 - 100.0 fL   MCH 26.6 26.0 - 34.0 pg   MCHC 30.6 30.0 - 36.0 g/dL   RDW 38.1 82.9 - 93.7 %   Platelets 190 150 - 400 K/uL   nRBC 0.0 0.0 - 0.2 %   Neutrophils Relative % 44 %   Neutro Abs 1.7 1.7 - 7.7 K/uL   Lymphocytes Relative 46 %   Lymphs Abs 1.8 0.7 - 4.0 K/uL   Monocytes Relative 7 %   Monocytes Absolute 0.3 0 - 1 K/uL   Eosinophils Relative 2 %   Eosinophils Absolute 0.1 0 - 0 K/uL   Basophils Relative 1 %   Basophils Absolute 0.0 0 - 0 K/uL   Immature Granulocytes 0 %   Abs Immature Granulocytes 0.01 0.00 - 0.07 K/uL    Comment: Performed at [facility], [address] [place], [place] [address]  hCG, serum, qualitative (Not at [facility])     Status: None   Collection Time: 04/02/20  9:02 AM  Result Value Ref Range   Preg, Serum NEGATIVE NEGATIVE    Comment:        THE SENSITIVITY OF THIS METHODOLOGY IS >10 mIU/mL. Performed at [facility], [address], [place], [place]  [address]    No results found.  Review of Systems  Constitutional: Negative.   HENT: Positive for trouble swallowing.   Eyes: Negative.   Respiratory: Negative.   Cardiovascular: Negative.   Gastrointestinal: Negative.   Endocrine: Negative.   Genitourinary: Negative.   Musculoskeletal: Negative.   Skin: Negative.   Allergic/Immunologic: Negative.   Neurological: Negative.   Hematological: Negative.   Psychiatric/Behavioral: Negative.     Blood pressure 114/72, pulse 60, temperature 98.4 F (36.9 C), temperature source Oral, resp. rate 12, height 5\' 6"  (1.676 m), weight 81.6 kg, last menstrual period 03/20/2020,  SpO2 100 %. Physical Exam  GENERAL: The patient is AO x3, in no acute distress. HEENT: Head is normocephalic and atraumatic. EOMI are intact. Mouth is well hydrated and without lesions. NECK: Supple. No masses LUNGS: Clear to auscultation. No presence of rhonchi/wheezing/rales. Adequate chest expansion HEART: RRR, normal s1 and s2. ABDOMEN: Soft, nontender, no guarding, no peritoneal signs, and nondistended. BS +. No masses. EXTREMITIES: Without any cyanosis, clubbing, rash, lesions or edema. NEUROLOGIC: AOx3, no focal motor deficit. SKIN: no jaundice, no rashes  Assessment/Plan 44 y.o. female with past medical history of anxiety, GERD, hypothyroidism, who comes to the hospital for evaluation of dysphagia and retrosternal discomfort we will proceed with EGD for investigation of her dysphagia and retrosternal chest discomfort.  Dolores Frame, MD 04/03/2020, 1:23 PM

## 2020-04-03 NOTE — Anesthesia Postprocedure Evaluation (Signed)
Anesthesia Post Note  Patient: Donna Mccarthy  Procedure(s) Performed: ESOPHAGOGASTRODUODENOSCOPY (EGD) WITH PROPOFOL (N/A ) BIOPSY  Patient location during evaluation: Endoscopy Anesthesia Type: MAC Level of consciousness: awake, awake and alert, oriented and patient cooperative Pain management: pain level controlled Vital Signs Assessment: post-procedure vital signs reviewed and stable Respiratory status: spontaneous breathing, nonlabored ventilation and respiratory function stable Cardiovascular status: stable and blood pressure returned to baseline Postop Assessment: no apparent nausea or vomiting Anesthetic complications: no   No complications documented.   Last Vitals:  Vitals:   04/03/20 1034 04/03/20 1501  BP: 114/72 106/66  Pulse: 60 74  Resp: 12 20  Temp: 36.9 C 36.9 C  SpO2: 100% 100%    Last Pain:  Vitals:   04/03/20 1501  TempSrc: Oral  PainSc:                  [name] L

## 2020-04-03 NOTE — Discharge Instructions (Signed)
You are being discharged to home.  Resume your previous diet.  Take Dexilant (dexlansoprazole) 60 mg by mouth once a day for three months.  We are waiting for your pathology results.   PATIENT INSTRUCTIONS POST-ANESTHESIA  IMMEDIATELY FOLLOWING SURGERY:  Do not drive or operate machinery for the first twenty four hours after surgery.  Do not make any important decisions for twenty four hours after surgery or while taking narcotic pain medications or sedatives.  If you develop intractable nausea and vomiting or a severe headache please notify your doctor immediately.  FOLLOW-UP:  Please make an appointment with your surgeon as instructed. You do not need to follow up with anesthesia unless specifically instructed to do so.  WOUND CARE INSTRUCTIONS (if applicable):  Keep a dry clean dressing on the anesthesia/puncture wound site if there is drainage.  Once the wound has quit draining you may leave it open to air.  Generally you should leave the bandage intact for twenty four hours unless there is drainage.  If the epidural site drains for more than 36-48 hours please call the anesthesia department.  QUESTIONS?:  Please feel free to call your physician or the hospital operator if you have any questions, and they will be happy to assist you.         Upper Endoscopy, Adult, Care After This sheet gives you information about how to care for yourself after your procedure. Your health care provider may also give you more specific instructions. If you have problems or questions, contact your health care provider. What can I expect after the procedure? After the procedure, it is common to have:  A sore throat.  Mild stomach pain or discomfort.  Bloating.  Nausea. Follow these instructions at home:   Follow instructions from your health care provider about what to eat or drink after your procedure.  Return to your normal activities as told by your health care provider. Ask your health care  provider what activities are safe for you.  Take over-the-counter and prescription medicines only as told by your health care provider.  Do not drive for 24 hours if you were given a sedative during your procedure.  Keep all follow-up visits as told by your health care provider. This is important. Contact a health care provider if you have:  A sore throat that lasts longer than one day.  Trouble swallowing. Get help right away if:  You vomit blood or your vomit looks like coffee grounds.  You have: ? A fever. ? Bloody, black, or tarry stools. ? A severe sore throat or you cannot swallow. ? Difficulty breathing. ? Severe pain in your chest or abdomen. Summary  After the procedure, it is common to have a sore throat, mild stomach discomfort, bloating, and nausea.  Do not drive for 24 hours if you were given a sedative during the procedure.  Follow instructions from your health care provider about what to eat or drink after your procedure.  Return to your normal activities as told by your health care provider. This information is not intended to replace advice given to you by your health care provider. Make sure you discuss any questions you have with your health care provider. Document Revised: 01/26/2018 Document Reviewed: 01/04/2018 Elsevier Patient Education  2020 [employer].

## 2020-04-03 NOTE — Anesthesia Preprocedure Evaluation (Addendum)
Anesthesia Evaluation  Patient identified by MRN, date of birth, ID band Patient awake    Reviewed: Allergy & Precautions, NPO status , Patient's Chart, lab work & pertinent test results  History of Anesthesia Complications Negative for: history of anesthetic complications  Airway Mallampati: II  TM Distance: >3 FB Neck ROM: Full    Dental  (+) Dental Advisory Given, Chipped,    Pulmonary neg pulmonary ROS,    Pulmonary exam normal breath sounds clear to auscultation       Cardiovascular Exercise Tolerance: Good Normal cardiovascular exam Rhythm:Regular Rate:Normal     Neuro/Psych neg Headaches, PSYCHIATRIC DISORDERS Anxiety negative neurological ROS     GI/Hepatic Neg liver ROS, GERD  Medicated and Controlled,  Endo/Other  Hypothyroidism   Renal/GU negative Renal ROS     Musculoskeletal  (+) Arthritis ,   Abdominal   Peds  Hematology  (+) anemia ,   Anesthesia Other Findings   Reproductive/Obstetrics                           Anesthesia Physical Anesthesia Plan  ASA: II  Anesthesia Plan: General   Post-op Pain Management:    Induction: Intravenous  PONV Risk Score and Plan: TIVA  Airway Management Planned: Nasal Cannula and Natural Airway  Additional Equipment:   Intra-op Plan:   Post-operative Plan:   Informed Consent: I have reviewed the patients History and Physical, chart, labs and discussed the procedure including the risks, benefits and alternatives for the proposed anesthesia with the patient or authorized representative who has indicated his/her understanding and acceptance.     Dental advisory given  Plan Discussed with: CRNA and Surgeon  Anesthesia Plan Comments:         Anesthesia Quick Evaluation

## 2020-04-03 NOTE — Transfer of Care (Signed)
Immediate Anesthesia Transfer of Care Note  Patient: Donna Mccarthy  Procedure(s) Performed: ESOPHAGOGASTRODUODENOSCOPY (EGD) WITH PROPOFOL (N/A ) BIOPSY  Patient Location: Endoscopy Unit  Anesthesia Type:MAC  Level of Consciousness: awake, alert , oriented, drowsy and patient cooperative  Airway & Oxygen Therapy: Patient Spontanous Breathing  Post-op Assessment: Report given to RN and Post -op Vital signs reviewed and stable  Post vital signs: Reviewed and stable  Last Vitals:  Vitals Value Taken Time  BP 106/66 04/03/20 1501  Temp 36.9 C 04/03/20 1501  Pulse 74 04/03/20 1501  Resp 20 04/03/20 1501  SpO2 100 % 04/03/20 1501    Last Pain:  Vitals:   04/03/20 1501  TempSrc: Oral  PainSc:       Patients Stated Pain Goal: 5 (47/65/46 5035)  Complications: No complications documented.

## 2020-04-04 ENCOUNTER — Other Ambulatory Visit (INDEPENDENT_AMBULATORY_CARE_PROVIDER_SITE_OTHER): Payer: Self-pay | Admitting: Gastroenterology

## 2020-04-04 DIAGNOSIS — R1319 Other dysphagia: Secondary | ICD-10-CM

## 2020-04-04 DIAGNOSIS — R1310 Dysphagia, unspecified: Secondary | ICD-10-CM

## 2020-04-04 DIAGNOSIS — K449 Diaphragmatic hernia without obstruction or gangrene: Secondary | ICD-10-CM

## 2020-04-04 MED ORDER — DEXILANT 60 MG PO CPDR: 60.0000 mg | DELAYED_RELEASE_CAPSULE | Freq: Every day | ORAL | 0 refills | Status: DC

## 2020-04-05 ENCOUNTER — Other Ambulatory Visit: Payer: Self-pay

## 2020-04-05 LAB — SURGICAL PATHOLOGY

## 2020-04-05 MED FILL — LANSOPRAZOLE 30 MG CPDR: 30 | 30 days supply | Qty: 60 | Fill #0

## 2020-04-06 ENCOUNTER — Encounter (HOSPITAL_COMMUNITY): Payer: Self-pay | Admitting: Gastroenterology

## 2020-04-09 ENCOUNTER — Other Ambulatory Visit (INDEPENDENT_AMBULATORY_CARE_PROVIDER_SITE_OTHER): Payer: Self-pay | Admitting: Gastroenterology

## 2020-04-09 DIAGNOSIS — K2990 Gastroduodenitis, unspecified, without bleeding: Secondary | ICD-10-CM

## 2020-04-09 DIAGNOSIS — K2970 Gastritis, unspecified, without bleeding: Secondary | ICD-10-CM

## 2020-04-09 NOTE — Progress Notes (Signed)
Will order ure breath test, needs to be off PPI 2 weeks before procedure.  Ann, can you please send the referral for pH/impedancemetry and esophageal manometry?  Thanks,  Katrinka Blazing, MD Gastroenterology and Hepatology [facility]

## 2020-04-10 NOTE — Progress Notes (Signed)
Sounds good. Thanks 

## 2020-04-10 NOTE — Progress Notes (Signed)
Patient's insurance will not cover for to go outside [facility] facilities - so I will do referral the [facility] if that's ok

## 2020-04-10 NOTE — Progress Notes (Signed)
So you want pH/impedence study with esophageal manometry? Dx?

## 2020-04-11 ENCOUNTER — Ambulatory Visit (INDEPENDENT_AMBULATORY_CARE_PROVIDER_SITE_OTHER): Payer: No Typology Code available for payment source | Admitting: Plastic Surgery

## 2020-04-11 ENCOUNTER — Other Ambulatory Visit: Payer: Self-pay

## 2020-04-11 ENCOUNTER — Encounter: Payer: Self-pay | Admitting: Plastic Surgery

## 2020-04-11 VITALS — BP 103/57 | HR 89 | Temp 98.6°F | Ht 66.5 in | Wt 180.0 lb

## 2020-04-11 DIAGNOSIS — Z1159 Encounter for screening for other viral diseases: Secondary | ICD-10-CM

## 2020-04-11 DIAGNOSIS — Z411 Encounter for cosmetic surgery: Secondary | ICD-10-CM | POA: Diagnosis not present

## 2020-04-11 DIAGNOSIS — R1319 Other dysphagia: Secondary | ICD-10-CM

## 2020-04-11 DIAGNOSIS — L98491 Non-pressure chronic ulcer of skin of other sites limited to breakdown of skin: Secondary | ICD-10-CM

## 2020-04-11 NOTE — Progress Notes (Signed)
Referring Provider Avanell Shackleton, NP-C [address]. [place],  [place] [address]   CC: No chief complaint on file.     Donna Mccarthy is an 44 y.o. female.  HPI: Patient is here to discuss her upper arms.  I did a panniculectomy on her several months ago and she did great.  She is very happy with her result.  She is currently bothered by her upper arms and the excess skin in that area.  She did lose quite a bit of weight through diet and exercise and the excess skin in her upper arms is becoming irritated.  It is regularly pinched on the uniform that she has to wear for work.  She gets skin irritation that has not been controlled with over-the-counter medications.  She has photographs documenting the skin irritation in her record.  She would like to have it surgically treated.  Allergies  Allergen Reactions  . Penicillins Other (See Comments)    Mother said she had a rash  . Toradol [Ketorolac Tromethamine] Other (See Comments)    "felt like whole body was on fire"    Outpatient Encounter Medications as of 04/11/2020  Medication Sig Note  . APPLE CIDER VINEGAR PO Take 2 each by mouth. (Patient not taking: Reported on 03/28/2020)   . Ascorbic Acid (VITAMIN C) 100 MG tablet Take 100 mg by mouth daily. (Patient not taking: Reported on 04/11/2020)   . azelastine (ASTELIN) 0.1 % nasal spray Place 2 sprays into both nostrils 2 (two) times daily. (Patient not taking: Reported on 03/07/2020)   . azithromycin (ZITHROMAX) 250 MG tablet Take 500 mg today and 250 mg daily for 4 days. (Patient not taking: Reported on 03/28/2020)   . cholecalciferol (VITAMIN D3) 25 MCG (1000 UNIT) tablet Take 2,000 Units by mouth daily.   Marland Kitchen dexlansoprazole (DEXILANT) 60 MG capsule Take 1 capsule (60 mg total) by mouth daily.   . fluticasone (FLONASE) 50 MCG/ACT nasal spray Place 2 sprays into both nostrils daily. (Patient not taking: Reported on 03/29/2020)   . levocetirizine (XYZAL) 5 MG tablet Take 1 tablet (5  mg total) by mouth every evening. (Patient taking differently: Take 5 mg by mouth daily as needed for allergies. )   . levonorgestrel (MIRENA) 20 MCG/24HR IUD 1 each by Intrauterine route once.   . NP THYROID 30 MG tablet Take 30 mg by mouth daily before breakfast.  03/30/2020: Pt takes a 120 mg and 30 mg for a daily total of 150 mg daily  . thyroid (ARMOUR THYROID) 120 MG tablet Take 120 mg by mouth daily before breakfast. 03/30/2020: Pt takes a 120 mg and 30 mg for a daily total of 150 mg daily  . topiramate (TOPAMAX) 25 MG tablet Take 3 tablets (75 mg total) by mouth at bedtime.   . vitamin B-12 (CYANOCOBALAMIN) 50 MCG tablet Take 50 mcg by mouth daily.    No facility-administered encounter medications on file as of 04/11/2020.     Past Medical History:  Diagnosis Date  . Allergy   . Anemia   . Anxiety   . Arthritis    arthritis knees  . Blood transfusion without reported diagnosis   . GERD (gastroesophageal reflux disease)   . Hypothyroidism   . Neuromuscular disorder (HCC)   . Obesity (BMI 30-39.9) 01/05/2019    Past Surgical History:  Procedure Laterality Date  . BIOPSY  04/03/2020   Procedure: BIOPSY;  Surgeon: Dolores Frame, MD;  [facility];  Service: Gastroenterology;;  antral, body, distal esophagus, mid esophagus  . DILATION AND CURETTAGE OF UTERUS    . ESOPHAGOGASTRODUODENOSCOPY (EGD) WITH PROPOFOL N/A 04/03/2020   Procedure: ESOPHAGOGASTRODUODENOSCOPY (EGD) WITH PROPOFOL;  Surgeon: Dolores Frame, MD;  [facility]: [facility];  Service: Gastroenterology;  Laterality: N/A;  11:15  . PANNICULECTOMY N/A 08/04/2019   Procedure: PANNICULECTOMY;  Surgeon: [name], MD;  Location: [facility];  Service: Plastics;  Laterality: N/A;  2 hours only, please    Family History  Problem Relation Age of Onset  . Hypertension Mother   . Hypertension Father   . Stroke Father        TIA  . Hyperlipidemia Father   . Colon cancer  Maternal Aunt   . Diabetes Maternal Grandmother   . 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  There is some adipose tissue but this is not the primary component.  There is no scars.  Both hands are neurovascularly intact.  Assessment/Plan Patient presents to discuss brachioplasty.  I think she is a great candidate for bilateral upper extremity plate brachioplasty with liposuction.  I discussed the procedure with her in detail.  We discussed the risks include bleeding, infection, damage to surrounding structures, need for additional procedures.  I discussed the location and orientation of the scar.  I explained I would try to hide the scar along the inner aspect of her arm so that it will not be visible in the front or the back.  I explained the need for compressive garments postoperatively.  I explained the need to avoid strenuous activity for at least 3 to 4 weeks.  I explained I would not typically use drains but might for some reason she is to do so during her case.  All her questions were answered we will plan to move forward with this.  536 04/11/2020, 2:50 PM

## 2020-04-27 ENCOUNTER — Encounter: Payer: Self-pay | Admitting: Family Medicine

## 2020-04-27 ENCOUNTER — Ambulatory Visit (INDEPENDENT_AMBULATORY_CARE_PROVIDER_SITE_OTHER): Payer: No Typology Code available for payment source | Admitting: Family Medicine

## 2020-04-27 ENCOUNTER — Other Ambulatory Visit: Payer: Self-pay

## 2020-04-27 ENCOUNTER — Other Ambulatory Visit: Payer: Self-pay | Admitting: Family Medicine

## 2020-04-27 VITALS — BP 120/70 | HR 67 | Ht 66.0 in | Wt 180.6 lb

## 2020-04-27 DIAGNOSIS — R5383 Other fatigue: Secondary | ICD-10-CM

## 2020-04-27 DIAGNOSIS — Z13220 Encounter for screening for lipoid disorders: Secondary | ICD-10-CM

## 2020-04-27 DIAGNOSIS — Z0000 Encounter for general adult medical examination without abnormal findings: Secondary | ICD-10-CM | POA: Diagnosis not present

## 2020-04-27 DIAGNOSIS — E039 Hypothyroidism, unspecified: Secondary | ICD-10-CM

## 2020-04-27 DIAGNOSIS — D649 Anemia, unspecified: Secondary | ICD-10-CM

## 2020-04-27 DIAGNOSIS — E559 Vitamin D deficiency, unspecified: Secondary | ICD-10-CM | POA: Diagnosis not present

## 2020-04-27 DIAGNOSIS — G43909 Migraine, unspecified, not intractable, without status migrainosus: Secondary | ICD-10-CM

## 2020-04-27 MED FILL — TOPIRAMATE 25 MG TAB: 25 | 30 days supply | Qty: 90 | Fill #0

## 2020-04-27 NOTE — Patient Instructions (Signed)

## 2020-04-27 NOTE — Progress Notes (Signed)
Subjective:    Patient ID: Donna Mccarthy, female    DOB: 02/27/1976, 44 y.o.   MRN: [id number]  HPI Chief Complaint  Patient presents with  . fasting cpe    fasting cpe, no concerns, vitamin d check, sees obgyn-appt 10/11   She is new to the practice and here for a complete physical exam.  Other providers: OB/GYN - Zel[facility] Cairo  Dr. Arita Miss - plastic surgeon  Dr. Talmage Nap- endocrinologist   She reports feeling tired, more than usual recently.   Her mood is fairly good, more good days than bad ones. She is seeing a Veterinary surgeon.   Vitamin D def- taking 2,000 IUs   She saw GI and had an EGD and is having H. Pylori testing.   Dr. Talmage Nap is managing her thyroid function.    Social history: her son is 37 years old Denies smoking, drinking alcohol, drug use  Diet: vegan  Excerise: 4 days a week   Immunizations: UTD, [employer] employee   Health maintenance:  Mammogram: at OB/GYN  Colonoscopy: never  Last Gynecological Exam: at OB/GYN  Last Dental Exam: in the past 6 months  Last Eye Exam: last year   Wears seatbelt always, smoke detectors in home and functioning, does not text while driving and feels safe in home environment.   Reviewed allergies, medications, past medical, surgical, family, and social history.     Review of Systems Review of Systems Constitutional: -fever, -chills, -sweats, -unexpected weight change,+fatigue ENT: -runny nose, -ear pain, -sore throat Cardiology:  -chest pain, -palpitations, -edema Respiratory: -cough, -shortness of breath, -wheezing Gastroenterology: -abdominal pain, -nausea, -vomiting, -diarrhea, -constipation  Hematology: -bleeding or bruising problems Musculoskeletal: -arthralgias, -myalgias, -joint swelling, -back pain Ophthalmology: -vision changes Urology: -dysuria, -difficulty urinating, -hematuria, -urinary frequency, -urgency Neurology: -headache, -weakness, -tingling, -numbness       Objective:   Physical Exam BP 120/70    Pulse 67   Ht 5\' 6"  (1.676 m)   Wt 180 lb 9.6 oz (81.9 kg)   LMP 04/17/2020   BMI 29.15 kg/m   General Appearance:    Alert, cooperative, no distress, appears stated age  Head:    Normocephalic, without obvious abnormality, atraumatic  Eyes:    PERRL, conjunctiva/corneas clear, EOM's intact  Ears:    Normal TM's and external ear canals  Nose:   Mask on   Throat:   Mask on   Neck:   Supple, no lymphadenopathy;  thyroid:  no   enlargement/tenderness/nodules; no JVD  Back:    Spine nontender, no curvature, ROM normal, no CVA     tenderness  Lungs:     Clear to auscultation bilaterally without wheezes, rales or     ronchi; respirations unlabored  Chest Wall:    No tenderness or deformity   Heart:    Regular rate and rhythm, S1 and S2 normal, no murmur, rub   or gallop  Breast Exam:    OB/GYN  Abdomen:     Soft, non-tender, nondistended, normoactive bowel sounds,    no masses, no hepatosplenomegaly  Genitalia:    OB/GYN      Extremities:   No clubbing, cyanosis or edema  Pulses:   2+ and symmetric all extremities  Skin:   Skin color, texture, turgor normal, no rashes or lesions  Lymph nodes:   Cervical, supraclavicular, and axillary nodes normal  Neurologic:   CNII-XII intact, normal strength, sensation and gait          Psych:   Normal mood,  affect, hygiene and grooming.          Assessment & Plan:  Routine general medical examination at a health care facility - Plan: CBC with Differential/Platelet, Comprehensive metabolic panel, Lipid panel -Preventive health care discussed.  She sees OB/GYN.  Counseled on healthy lifestyle going diet and exercise.  She appears be taking good care of her self.  Immunizations up-to-date.  Discussed safety and health promotion  Vitamin D deficiency - Plan: VITAMIN D 25 Hydroxy (Vit-D Deficiency, Fractures) -Check vitamin D level and adjust dose as appropriate.  She is currently taking 2000 IUs daily  Hypothyroidism, unspecified type - Plan:  TSH, T4, free -Managed by Dr. Talmage Nap  Fatigue, unspecified type - Plan: CBC with Differential/Platelet, Comprehensive metabolic panel, TSH, T4, free, VITAMIN D 25 Hydroxy (Vit-D Deficiency, Fractures), Vitamin B12 -Multiple etiologies for fatigue.  Will check labs and follow-up  Screening for lipid disorders - Plan: Lipid panel -Follow-up pending results  Anemia, unspecified type - Plan: CBC with Differential/Platelet, Iron, TIBC and Ferritin Panel -Follow-up pending results

## 2020-04-28 LAB — COMPREHENSIVE METABOLIC PANEL
ALT: 16 IU/L (ref 0–32)
AST: 44 IU/L — ABNORMAL HIGH (ref 0–40)
Albumin/Globulin Ratio: 1.7 (ref 1.2–2.2)
Albumin: 4.4 g/dL (ref 3.8–4.8)
Alkaline Phosphatase: 67 IU/L (ref 48–121)
BUN/Creatinine Ratio: 8 — ABNORMAL LOW (ref 9–23)
BUN: 5 mg/dL — ABNORMAL LOW (ref 6–24)
Bilirubin Total: 0.9 mg/dL (ref 0.0–1.2)
CO2: 20 mmol/L (ref 20–29)
Calcium: 9.4 mg/dL (ref 8.7–10.2)
Chloride: 106 mmol/L (ref 96–106)
Creatinine, Ser: 0.62 mg/dL (ref 0.57–1.00)
GFR calc Af Amer: 129 mL/min/{1.73_m2} (ref >59)
GFR calc non Af Amer: 112 mL/min/{1.73_m2} (ref >59)
Globulin, Total: 2.6 g/dL (ref 1.5–4.5)
Glucose: 86 mg/dL (ref 65–99)
Potassium: 3.8 mmol/L (ref 3.5–5.2)
Sodium: 138 mmol/L (ref 134–144)
Total Protein: 7 g/dL (ref 6.0–8.5)

## 2020-04-28 LAB — IRON,TIBC AND FERRITIN PANEL
Ferritin: 47 ng/mL (ref 15–150)
Iron Saturation: 31 % (ref 15–55)
Iron: 93 ug/dL (ref 27–159)
Total Iron Binding Capacity: 297 ug/dL (ref 250–450)
UIBC: 204 ug/dL (ref 131–425)

## 2020-04-28 LAB — CBC WITH DIFFERENTIAL/PLATELET
Basophils Absolute: 0 10*3/uL (ref 0.0–0.2)
Basos: 1 %
EOS (ABSOLUTE): 0.1 10*3/uL (ref 0.0–0.4)
Eos: 2 %
Hematocrit: 33 % — ABNORMAL LOW (ref 34.0–46.6)
Hemoglobin: 10.6 g/dL — ABNORMAL LOW (ref 11.1–15.9)
Immature Grans (Abs): 0 10*3/uL (ref 0.0–0.1)
Immature Granulocytes: 0 %
Lymphocytes Absolute: 2.1 10*3/uL (ref 0.7–3.1)
Lymphs: 49 %
MCH: 26.8 pg (ref 26.6–33.0)
MCHC: 32.1 g/dL (ref 31.5–35.7)
MCV: 83 fL (ref 79–97)
Monocytes Absolute: 0.3 10*3/uL (ref 0.1–0.9)
Monocytes: 7 %
Neutrophils Absolute: 1.7 10*3/uL (ref 1.4–7.0)
Neutrophils: 41 %
Platelets: 183 10*3/uL (ref 150–450)
RBC: 3.96 x10E6/uL (ref 3.77–5.28)
RDW: 12.9 % (ref 11.7–15.4)
WBC: 4.1 10*3/uL (ref 3.4–10.8)

## 2020-04-28 LAB — LIPID PANEL
Chol/HDL Ratio: 2.6 ratio (ref 0.0–4.4)
Cholesterol, Total: 146 mg/dL (ref 100–199)
HDL: 57 mg/dL (ref >39)
LDL Chol Calc (NIH): 81 mg/dL (ref 0–99)
Triglycerides: 30 mg/dL (ref 0–149)
VLDL Cholesterol Cal: 8 mg/dL (ref 5–40)

## 2020-04-28 LAB — VITAMIN B12: Vitamin B-12: 546 pg/mL (ref 232–1245)

## 2020-04-28 LAB — VITAMIN D 25 HYDROXY (VIT D DEFICIENCY, FRACTURES): Vit D, 25-Hydroxy: 26.8 ng/mL — ABNORMAL LOW (ref 30.0–100.0)

## 2020-04-28 LAB — T4, FREE: Free T4: 1.03 ng/dL (ref 0.82–1.77)

## 2020-04-28 LAB — TSH: TSH: 1.26 u[IU]/mL (ref 0.450–4.500)

## 2020-04-29 NOTE — Progress Notes (Signed)
Please ask Byrd Hesselbach to add an acute hepatitis panel due to elevated AST.

## 2020-05-01 ENCOUNTER — Encounter: Payer: Self-pay | Admitting: Family Medicine

## 2020-05-01 ENCOUNTER — Other Ambulatory Visit: Payer: Self-pay | Admitting: Family Medicine

## 2020-05-01 MED ORDER — VITAMIN D-3 125 MCG (5000 UT) PO TABS: 1.0000 | ORAL_TABLET | Freq: Every day | ORAL | 1 refills | Status: DC

## 2020-05-02 ENCOUNTER — Other Ambulatory Visit (HOSPITAL_BASED_OUTPATIENT_CLINIC_OR_DEPARTMENT_OTHER): Payer: Self-pay | Admitting: Endocrinology

## 2020-05-02 MED FILL — NP THYROID 60 MG TABLET: 60 | 30 days supply | Qty: 60 | Fill #2

## 2020-05-02 MED FILL — VITAMIN D3 125 MCG (5000 UT: 125 MCG | 100 days supply | Qty: 100 | Fill #0

## 2020-05-03 MED FILL — NP THYROID 30 MG TABLET: 30 | 30 days supply | Qty: 30 | Fill #0

## 2020-05-18 LAB — HM MAMMOGRAPHY

## 2020-05-18 LAB — HM PAP SMEAR

## 2020-05-22 LAB — HEPATITIS PANEL, ACUTE
Hep A IgM: NEGATIVE
Hep B C IgM: NEGATIVE
Hep C Virus Ab: 0.1 s/co ratio (ref 0.0–0.9)
Hepatitis B Surface Ag: NEGATIVE

## 2020-05-22 LAB — SPECIMEN STATUS REPORT

## 2020-05-28 ENCOUNTER — Encounter: Payer: No Typology Code available for payment source | Admitting: Surgical

## 2020-05-28 ENCOUNTER — Other Ambulatory Visit (INDEPENDENT_AMBULATORY_CARE_PROVIDER_SITE_OTHER): Payer: No Typology Code available for payment source

## 2020-05-28 ENCOUNTER — Other Ambulatory Visit: Payer: Self-pay

## 2020-05-28 ENCOUNTER — Other Ambulatory Visit: Payer: Self-pay | Admitting: Endocrinology

## 2020-05-28 DIAGNOSIS — Z23 Encounter for immunization: Secondary | ICD-10-CM | POA: Diagnosis not present

## 2020-05-28 DIAGNOSIS — E049 Nontoxic goiter, unspecified: Secondary | ICD-10-CM

## 2020-06-05 MED FILL — TOPIRAMATE 25 MG TAB: 25 | 30 days supply | Qty: 90 | Fill #1

## 2020-06-05 MED FILL — NP THYROID 120 MG TABLET: 120 | 30 days supply | Qty: 30 | Fill #0

## 2020-06-05 MED FILL — NP THYROID 30 MG TABLET: 30 | 30 days supply | Qty: 30 | Fill #1

## 2020-06-09 ENCOUNTER — Other Ambulatory Visit (HOSPITAL_COMMUNITY): Payer: No Typology Code available for payment source

## 2020-06-11 ENCOUNTER — Other Ambulatory Visit: Payer: Self-pay | Admitting: Surgical

## 2020-06-11 ENCOUNTER — Other Ambulatory Visit: Payer: Self-pay

## 2020-06-11 ENCOUNTER — Encounter: Payer: Self-pay | Admitting: Surgical

## 2020-06-11 ENCOUNTER — Ambulatory Visit (INDEPENDENT_AMBULATORY_CARE_PROVIDER_SITE_OTHER): Payer: Self-pay | Admitting: Surgical

## 2020-06-11 VITALS — BP 110/64 | HR 73 | Temp 97.6°F | Ht 66.0 in | Wt 177.8 lb

## 2020-06-11 DIAGNOSIS — L98491 Non-pressure chronic ulcer of skin of other sites limited to breakdown of skin: Secondary | ICD-10-CM

## 2020-06-11 DIAGNOSIS — Z411 Encounter for cosmetic surgery: Secondary | ICD-10-CM

## 2020-06-11 MED ORDER — HYDROCODONE-ACETAMINOPHEN 5-325 MG PO TABS
1.0000 | ORAL_TABLET | Freq: Four times a day (QID) | ORAL | 0 refills | Status: DC | PRN
Start: 2020-06-11 — End: 2020-06-11

## 2020-06-11 MED ORDER — ONDANSETRON HCL 4 MG PO TABS: 4.0000 mg | ORAL_TABLET | Freq: Three times a day (TID) | ORAL | 0 refills | Status: DC | PRN

## 2020-06-11 MED FILL — ONDANSETRON HCL 4 MG TABLET: 4 | 6 days supply | Qty: 20 | Fill #0

## 2020-06-11 MED FILL — HYDROCODON-APAP 5-325: 5-325 | 5 days supply | Qty: 20 | Fill #0

## 2020-06-11 NOTE — Progress Notes (Signed)
Patient ID: Donna Mccarthy, female    DOB: [birth year], 44 y.o.   MRN: [id number]  Chief Complaint  Patient presents with  . Pre-op Exam      ICD-10-CM   1. Skin ulcer of upper arm, limited to breakdown of skin (HCC)  L98.491   2. Encounter for cosmetic procedure  Z41.1      History of Present Illness: Donna Mccarthy is a 44 y.o.  female  with a history of bilateral upper arm excess skin.  She presents for preoperative evaluation for upcoming procedure, bilateral brachioplasty with liposuction, scheduled for 07/03/20 with Dr. Arita Miss  The patient has not had problems with anesthesia. No history of DVT/PE.  No family history of DVT/PE.  No family or personal history of bleeding or clotting disorders.  Patient is not currently taking any blood thinners.  No history of CVA/MI.   Job: CMA  PMH Significant for: anemia, GERD, migraines.  Has Mirena IUD Patient had normal echo 10/2019, wore zio patch this year - no arrhythmias found  Patient reports she is overall been feeling well lately, no more episodes of heart palpitations or dizziness or weakness.   Past Medical History: Allergies: Allergies  Allergen Reactions  . Penicillins Other (See Comments)    Mother said she had a rash  . Toradol [Ketorolac Tromethamine] Other (See Comments)    "felt like whole body was on fire"    Current Medications:  Current Outpatient Medications:  .  Ascorbic Acid (VITAMIN C) 100 MG tablet, Take 100 mg by mouth daily. , Disp: , Rfl:  .  Cholecalciferol (VITAMIN D-3) 125 MCG (5000 UT) TABS, Take 1 tablet by mouth daily., Disp: 30 tablet, Rfl: 1 .  levonorgestrel (MIRENA) 20 MCG/24HR IUD, 1 each by Intrauterine route once., Disp: , Rfl:  .  NP THYROID 30 MG tablet, Take 30 mg by mouth daily before breakfast. , Disp: , Rfl:  .  thyroid (ARMOUR THYROID) 120 MG tablet, Take 120 mg by mouth daily before breakfast., Disp: , Rfl:  .  topiramate (TOPAMAX) 25 MG tablet, TAKE 3 TABLETS (75 MG TOTAL)  BY MOUTH AT BEDTIME., Disp: 90 tablet, Rfl: 3 .  vitamin B-12 (CYANOCOBALAMIN) 50 MCG tablet, Take 50 mcg by mouth daily., Disp: , Rfl:  .  azelastine (ASTELIN) 0.1 % nasal spray, Place 2 sprays into both nostrils 2 (two) times daily. (Patient not taking: Reported on 06/11/2020), Disp: 30 mL, Rfl: 0 .  dexlansoprazole (DEXILANT) 60 MG capsule, Take 1 capsule (60 mg total) by mouth daily. (Patient not taking: Reported on 06/11/2020), Disp: 90 capsule, Rfl: 0 .  fluticasone (FLONASE) 50 MCG/ACT nasal spray, Place 2 sprays into both nostrils daily. (Patient not taking: Reported on 06/11/2020), Disp: 1 g, Rfl: 0  Past Medical Problems: Past Medical History:  Diagnosis Date  . Allergy   . Anemia   . Anxiety   . Arthritis    arthritis knees  . Blood transfusion without reported diagnosis   . GERD (gastroesophageal reflux disease)   . Hypothyroidism   . Neuromuscular disorder (HCC)   . Obesity (BMI 30-39.9) 01/05/2019    Past Surgical History: Past Surgical History:  Procedure Laterality Date  . BIOPSY  04/03/2020   Procedure: BIOPSY;  Surgeon: Marguerita Merles, Reuel Boom, MD;  Location: [facility];  Service: Gastroenterology;;  antral, body, distal esophagus, mid esophagus  . DILATION AND CURETTAGE OF UTERUS    . ESOPHAGOGASTRODUODENOSCOPY (EGD) WITH PROPOFOL N/A 04/03/2020   Procedure:  ESOPHAGOGASTRODUODENOSCOPY (EGD) WITH PROPOFOL;  Surgeon: Marguerita Merles, Reuel Boom, MD;  Location: [facility];  Service: Gastroenterology;  Laterality: N/A;  11:15  . PANNICULECTOMY N/A 08/04/2019   Procedure: PANNICULECTOMY;  Surgeon: [name], MD;  Location: [facility];  Service: Plastics;  Laterality: N/A;  2 hours only, please    Social History: Social History   Socioeconomic History  . Marital status: Single    Spouse name: Not on file  . Number of children: 1  . Years of education: 85  . Highest education level: Associate degree: occupational, Scientist, product/process development, or vocational  program  Occupational History  . Occupation: cma    Employer: [employer]  Tobacco Use  . Smoking status: Never Smoker  . Smokeless tobacco: Never Used  Vaping Use  . Vaping Use: Never used  Substance and Sexual Activity  . Alcohol use: No  . Drug use: No  . Sexual activity: Yes    Birth control/protection: I.U.D.  Other Topics Concern  . 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 Difficulty of Paying Living Expenses: Not on file  Food Insecurity:   . Worried About Programme researcher, broadcasting/film/video in the Last Year: Not on file  . Ran Out of Food in the Last Year: Not on file  Transportation Needs:   . Lack of Transportation (Medical): Not on file  . Lack of Transportation (Non-Medical): Not on file  Physical Activity:   . Days of Exercise per Week: Not on file  . Minutes of Exercise per Session: Not on file  Stress:   . Feeling of Stress : Not on file  Social Connections:   . Frequency of Communication with Friends and Family: Not on file  . Frequency of Social Gatherings with Friends and Family: Not on file  . Attends Religious Services: Not on file  . Active Member of Clubs or Organizations: Not on file  . Attends Banker Meetings: Not on file  . Marital Status: Not on file  Intimate Partner Violence:   . Fear of Current or Ex-Partner: Not on file  . Emotionally Abused: Not on file  . Physically Abused: Not on file  . Sexually Abused: Not on file    Family History: Family History  Problem Relation Age of Onset  . Hypertension Mother   . Crohn's disease Mother   . Hypertension Father        POTS  . Stroke Father        TIA  . Hyperlipidemia Father   . Hypertension Brother   . Colon cancer Maternal Aunt   . Diabetes Maternal  Grandmother   . Heart failure Maternal Grandmother   . Thyroid disease Paternal Grandmother   . Vitamin D deficiency Paternal Grandmother   . Heart failure Paternal Grandfather   . Heart attack Paternal Grandfather     Review of Systems: Review of Systems  Constitutional: Negative.   Respiratory: Negative.   Cardiovascular: Negative.   Gastrointestinal: Negative.   Neurological: Negative.     Physical Exam: Vital Signs BP 110/64 (BP Location: Left Arm, Patient Position: Sitting, Cuff Size: Large)   Pulse 73   Temp 97.6 F (36.4 C) (Oral)   Ht  5\' 6"  (1.676 m)   Wt 177 lb 12.8 oz (80.6 kg)   LMP 06/10/2020 (Exact Date)   SpO2 100%   BMI 28.70 kg/m  Physical Exam Exam conducted with a chaperone present.  Constitutional:      General: She is not in acute distress.    Appearance: Normal appearance. She is not ill-appearing.  HENT:     Head: Normocephalic and atraumatic.  Eyes:     Pupils: Pupils are equal, round Neck:     Musculoskeletal: Normal range of motion.  Cardiovascular:     Rate and Rhythm: Normal rate and regular rhythm.     Pulses: Normal pulses.     Heart sounds: Normal heart sounds. No murmur.  Pulmonary:     Effort: Pulmonary effort is normal. No respiratory distress.     Breath sounds: Normal breath sounds. No wheezing.  Abdominal:     General: Abdomen is flat. There is no distension.     Palpations: Abdomen is soft.     Tenderness: There is no abdominal tenderness.  Musculoskeletal: Normal range of motion.  Skin:    General: Skin is warm and dry.     Findings: No erythema or rash.  Bilateral upper arm excess tissue and skin noted. Neurological:     General: No focal deficit present.     Mental Status: She is alert and oriented to person, place, and time. Mental status is at baseline.     Motor: No weakness.  Psychiatric:        Mood and Affect: Mood normal.        Behavior: Behavior normal.    Assessment/Plan: The patient is scheduled for  bilateral brachioplasty with liposuction with Dr. 06/12/2020.  Risks, benefits, and alternatives of procedure discussed, questions answered and consent obtained.   Smoking Status: Non-smoker; Counseling Given?  N/A Last Mammogram: N/A; Results: N/A  Caprini Score: 5, high; Risk Factors include: mirena IUD, BMI > 25, and length of planned surgery. Recommendation for mechanical and pharmacological prophylaxis. Encourage early ambulation.   Post-op Rx sent to pharmacy: norco, zofran, keflex  Patient was provided with the General Surgical Risk consent document and Pain Medication Agreement prior to their appointment.  They had adequate time to read through the risk consent documents and Pain Medication Agreement. We also discussed them in person together during this preop appointment. All of their questions were answered to their satisfaction.  Recommended calling if they have any further questions.  Risk consent form and Pain Medication Agreement to be scanned into patient's chart.  We discussed the specific risks associated with upper arm brachioplasty.  The risks that can be encountered with and after liposuction were discussed and include the following but no limited to these:  Asymmetry, fluid accumulation, firmness of the area, fat necrosis with death of fat tissue, bleeding, infection, delayed healing, anesthesia risks, skin sensation changes, injury to structures including nerves, blood vessels, and muscles which may be temporary or permanent, allergies to tape, suture materials and glues, blood products, topical preparations or injected agents, skin and contour irregularities, skin discoloration and swelling, deep vein thrombosis, cardiac and pulmonary complications, pain, which may persist, persistent pain, recurrence of the lesion, poor healing of the incision, possible need for revisional surgery or staged procedures. Thiere can also be persistent swelling, poor wound healing, rippling or loose skin,  worsening of cellulite, swelling, and thermal burn or heat injury from ultrasound with the ultrasound-assisted lipoplasty technique. Any change in weight fluctuations can alter the  outcome.     Electronically signed by: Kermit BaloMatthew J Kentrail Shew, PA-C 06/11/2020 1:25 PM

## 2020-06-11 NOTE — H&P (View-Only) (Signed)
Patient ID: Donna Mccarthy, female    DOB: [birth year], 44 y.o.   MRN: [id number]  Chief Complaint  Patient presents with  . Pre-op Exam      ICD-10-CM   1. Skin ulcer of upper arm, limited to breakdown of skin (HCC)  L98.491   2. Encounter for cosmetic procedure  Z41.1      History of Present Illness: Donna Mccarthy is a 44 y.o.  female  with a history of bilateral upper arm excess skin.  She presents for preoperative evaluation for upcoming procedure, bilateral brachioplasty with liposuction, scheduled for 07/03/20 with Dr. Arita Miss  The patient has not had problems with anesthesia. No history of DVT/PE.  No family history of DVT/PE.  No family or personal history of bleeding or clotting disorders.  Patient is not currently taking any blood thinners.  No history of CVA/MI.   Job: CMA  PMH Significant for: anemia, GERD, migraines.  Has Mirena IUD Patient had normal echo 10/2019, wore zio patch this year - no arrhythmias found  Patient reports she is overall been feeling well lately, no more episodes of heart palpitations or dizziness or weakness.   Past Medical History: Allergies: Allergies  Allergen Reactions  . Penicillins Other (See Comments)    Mother said she had a rash  . Toradol [Ketorolac Tromethamine] Other (See Comments)    "felt like whole body was on fire"    Current Medications:  Current Outpatient Medications:  .  Ascorbic Acid (VITAMIN C) 100 MG tablet, Take 100 mg by mouth daily. , Disp: , Rfl:  .  Cholecalciferol (VITAMIN D-3) 125 MCG (5000 UT) TABS, Take 1 tablet by mouth daily., Disp: 30 tablet, Rfl: 1 .  levonorgestrel (MIRENA) 20 MCG/24HR IUD, 1 each by Intrauterine route once., Disp: , Rfl:  .  NP THYROID 30 MG tablet, Take 30 mg by mouth daily before breakfast. , Disp: , Rfl:  .  thyroid (ARMOUR THYROID) 120 MG tablet, Take 120 mg by mouth daily before breakfast., Disp: , Rfl:  .  topiramate (TOPAMAX) 25 MG tablet, TAKE 3 TABLETS (75 MG TOTAL)  BY MOUTH AT BEDTIME., Disp: 90 tablet, Rfl: 3 .  vitamin B-12 (CYANOCOBALAMIN) 50 MCG tablet, Take 50 mcg by mouth daily., Disp: , Rfl:  .  azelastine (ASTELIN) 0.1 % nasal spray, Place 2 sprays into both nostrils 2 (two) times daily. (Patient not taking: Reported on 06/11/2020), Disp: 30 mL, Rfl: 0 .  dexlansoprazole (DEXILANT) 60 MG capsule, Take 1 capsule (60 mg total) by mouth daily. (Patient not taking: Reported on 06/11/2020), Disp: 90 capsule, Rfl: 0 .  fluticasone (FLONASE) 50 MCG/ACT nasal spray, Place 2 sprays into both nostrils daily. (Patient not taking: Reported on 06/11/2020), Disp: 1 g, Rfl: 0  Past Medical Problems: Past Medical History:  Diagnosis Date  . Allergy   . Anemia   . Anxiety   . Arthritis    arthritis knees  . Blood transfusion without reported diagnosis   . GERD (gastroesophageal reflux disease)   . Hypothyroidism   . Neuromuscular disorder (HCC)   . Obesity (BMI 30-39.9) 01/05/2019    Past Surgical History: Past Surgical History:  Procedure Laterality Date  . BIOPSY  04/03/2020   Procedure: BIOPSY;  Surgeon: Marguerita Merles, Reuel Boom, MD;  Location: [facility];  Service: Gastroenterology;;  antral, body, distal esophagus, mid esophagus  . DILATION AND CURETTAGE OF UTERUS    . ESOPHAGOGASTRODUODENOSCOPY (EGD) WITH PROPOFOL N/A 04/03/2020   Procedure:  ESOPHAGOGASTRODUODENOSCOPY (EGD) WITH PROPOFOL;  Surgeon: Marguerita Merles, Reuel Boom, MD;  Location: [facility];  Service: Gastroenterology;  Laterality: N/A;  11:15  . PANNICULECTOMY N/A 08/04/2019   Procedure: PANNICULECTOMY;  Surgeon: [name], MD;  Location: [facility];  Service: Plastics;  Laterality: N/A;  2 hours only, please    Social History: Social History   Socioeconomic History  . Marital status: Single    Spouse name: Not on file  . Number of children: 1  . Years of education: 85  . Highest education level: Associate degree: occupational, Scientist, product/process development, or vocational  program  Occupational History  . Occupation: cma    Employer: [employer]  Tobacco Use  . Smoking status: Never Smoker  . Smokeless tobacco: Never Used  Vaping Use  . Vaping Use: Never used  Substance and Sexual Activity  . Alcohol use: No  . Drug use: No  . Sexual activity: Yes    Birth control/protection: I.U.D.  Other Topics Concern  . 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 Difficulty of Paying Living Expenses: Not on file  Food Insecurity:   . Worried About Programme researcher, broadcasting/film/video in the Last Year: Not on file  . Ran Out of Food in the Last Year: Not on file  Transportation Needs:   . Lack of Transportation (Medical): Not on file  . Lack of Transportation (Non-Medical): Not on file  Physical Activity:   . Days of Exercise per Week: Not on file  . Minutes of Exercise per Session: Not on file  Stress:   . Feeling of Stress : Not on file  Social Connections:   . Frequency of Communication with Friends and Family: Not on file  . Frequency of Social Gatherings with Friends and Family: Not on file  . Attends Religious Services: Not on file  . Active Member of Clubs or Organizations: Not on file  . Attends Banker Meetings: Not on file  . Marital Status: Not on file  Intimate Partner Violence:   . Fear of Current or Ex-Partner: Not on file  . Emotionally Abused: Not on file  . Physically Abused: Not on file  . Sexually Abused: Not on file    Family History: Family History  Problem Relation Age of Onset  . Hypertension Mother   . Crohn's disease Mother   . Hypertension Father        POTS  . Stroke Father        TIA  . Hyperlipidemia Father   . Hypertension Brother   . Colon cancer Maternal Aunt   . Diabetes Maternal  Grandmother   . Heart failure Maternal Grandmother   . Thyroid disease Paternal Grandmother   . Vitamin D deficiency Paternal Grandmother   . Heart failure Paternal Grandfather   . Heart attack Paternal Grandfather     Review of Systems: Review of Systems  Constitutional: Negative.   Respiratory: Negative.   Cardiovascular: Negative.   Gastrointestinal: Negative.   Neurological: Negative.     Physical Exam: Vital Signs BP 110/64 (BP Location: Left Arm, Patient Position: Sitting, Cuff Size: Large)   Pulse 73   Temp 97.6 F (36.4 C) (Oral)   Ht  5\' 6"  (1.676 m)   Wt 177 lb 12.8 oz (80.6 kg)   LMP 06/10/2020 (Exact Date)   SpO2 100%   BMI 28.70 kg/m  Physical Exam Exam conducted with a chaperone present.  Constitutional:      General: She is not in acute distress.    Appearance: Normal appearance. She is not ill-appearing.  HENT:     Head: Normocephalic and atraumatic.  Eyes:     Pupils: Pupils are equal, round Neck:     Musculoskeletal: Normal range of motion.  Cardiovascular:     Rate and Rhythm: Normal rate and regular rhythm.     Pulses: Normal pulses.     Heart sounds: Normal heart sounds. No murmur.  Pulmonary:     Effort: Pulmonary effort is normal. No respiratory distress.     Breath sounds: Normal breath sounds. No wheezing.  Abdominal:     General: Abdomen is flat. There is no distension.     Palpations: Abdomen is soft.     Tenderness: There is no abdominal tenderness.  Musculoskeletal: Normal range of motion.  Skin:    General: Skin is warm and dry.     Findings: No erythema or rash.  Bilateral upper arm excess tissue and skin noted. Neurological:     General: No focal deficit present.     Mental Status: She is alert and oriented to person, place, and time. Mental status is at baseline.     Motor: No weakness.  Psychiatric:        Mood and Affect: Mood normal.        Behavior: Behavior normal.    Assessment/Plan: The patient is scheduled for  bilateral brachioplasty with liposuction with Dr. 06/12/2020.  Risks, benefits, and alternatives of procedure discussed, questions answered and consent obtained.   Smoking Status: Non-smoker; Counseling Given?  N/A Last Mammogram: N/A; Results: N/A  Caprini Score: 5, high; Risk Factors include: mirena IUD, BMI > 25, and length of planned surgery. Recommendation for mechanical and pharmacological prophylaxis. Encourage early ambulation.   Post-op Rx sent to pharmacy: norco, zofran, keflex  Patient was provided with the General Surgical Risk consent document and Pain Medication Agreement prior to their appointment.  They had adequate time to read through the risk consent documents and Pain Medication Agreement. We also discussed them in person together during this preop appointment. All of their questions were answered to their satisfaction.  Recommended calling if they have any further questions.  Risk consent form and Pain Medication Agreement to be scanned into patient's chart.  We discussed the specific risks associated with upper arm brachioplasty.  The risks that can be encountered with and after liposuction were discussed and include the following but no limited to these:  Asymmetry, fluid accumulation, firmness of the area, fat necrosis with death of fat tissue, bleeding, infection, delayed healing, anesthesia risks, skin sensation changes, injury to structures including nerves, blood vessels, and muscles which may be temporary or permanent, allergies to tape, suture materials and glues, blood products, topical preparations or injected agents, skin and contour irregularities, skin discoloration and swelling, deep vein thrombosis, cardiac and pulmonary complications, pain, which may persist, persistent pain, recurrence of the lesion, poor healing of the incision, possible need for revisional surgery or staged procedures. Thiere can also be persistent swelling, poor wound healing, rippling or loose skin,  worsening of cellulite, swelling, and thermal burn or heat injury from ultrasound with the ultrasound-assisted lipoplasty technique. Any change in weight fluctuations can alter the  outcome.     Electronically signed by: Brandii Lakey J Greely Atiyeh, PA-C 06/11/2020 1:25 PM 

## 2020-06-12 ENCOUNTER — Other Ambulatory Visit: Payer: No Typology Code available for payment source

## 2020-06-12 ENCOUNTER — Other Ambulatory Visit: Payer: Self-pay | Admitting: *Deleted

## 2020-06-12 DIAGNOSIS — Z20822 Contact with and (suspected) exposure to covid-19: Secondary | ICD-10-CM

## 2020-06-13 LAB — SARS-COV-2, NAA 2 DAY TAT

## 2020-06-13 LAB — NOVEL CORONAVIRUS, NAA: SARS-CoV-2, NAA: NOT DETECTED

## 2020-06-20 ENCOUNTER — Encounter: Payer: No Typology Code available for payment source | Admitting: Plastic Surgery

## 2020-06-25 ENCOUNTER — Other Ambulatory Visit: Payer: Self-pay

## 2020-06-30 ENCOUNTER — Other Ambulatory Visit (HOSPITAL_COMMUNITY)
Admission: RE | Admit: 2020-06-30 | Discharge: 2020-06-30 | Disposition: A | Discharge status: Home / Self Care | Payer: No Typology Code available for payment source | Source: Ambulatory Visit | Attending: Plastic Surgery | Admitting: Plastic Surgery

## 2020-06-30 DIAGNOSIS — Z20822 Contact with and (suspected) exposure to covid-19: Secondary | ICD-10-CM | POA: Diagnosis not present

## 2020-06-30 DIAGNOSIS — Z01812 Encounter for preprocedural laboratory examination: Secondary | ICD-10-CM | POA: Insufficient documentation

## 2020-06-30 LAB — SARS CORONAVIRUS 2 (TAT 6-24 HRS): SARS Coronavirus 2: NEGATIVE

## 2020-07-02 ENCOUNTER — Encounter (HOSPITAL_BASED_OUTPATIENT_CLINIC_OR_DEPARTMENT_OTHER)
Admission: RE | Disposition: A | Discharge status: Home / Self Care | Payer: Self-pay | Source: Home / Self Care | Attending: Plastic Surgery

## 2020-07-02 ENCOUNTER — Other Ambulatory Visit: Payer: Self-pay

## 2020-07-02 ENCOUNTER — Ambulatory Visit (HOSPITAL_BASED_OUTPATIENT_CLINIC_OR_DEPARTMENT_OTHER): Payer: No Typology Code available for payment source | Admitting: Certified Registered"

## 2020-07-02 ENCOUNTER — Encounter (HOSPITAL_BASED_OUTPATIENT_CLINIC_OR_DEPARTMENT_OTHER): Payer: Self-pay | Admitting: Plastic Surgery

## 2020-07-02 ENCOUNTER — Ambulatory Visit (HOSPITAL_BASED_OUTPATIENT_CLINIC_OR_DEPARTMENT_OTHER)
Admission: RE | Admit: 2020-07-02 | Discharge: 2020-07-02 | Disposition: A | Discharge status: Home / Self Care | Payer: No Typology Code available for payment source | Attending: Plastic Surgery | Admitting: Plastic Surgery

## 2020-07-02 DIAGNOSIS — Z79890 Hormone replacement therapy (postmenopausal): Secondary | ICD-10-CM | POA: Diagnosis not present

## 2020-07-02 DIAGNOSIS — Z975 Presence of (intrauterine) contraceptive device: Secondary | ICD-10-CM | POA: Insufficient documentation

## 2020-07-02 DIAGNOSIS — Z885 Allergy status to narcotic agent status: Secondary | ICD-10-CM | POA: Insufficient documentation

## 2020-07-02 DIAGNOSIS — L98491 Non-pressure chronic ulcer of skin of other sites limited to breakdown of skin: Secondary | ICD-10-CM

## 2020-07-02 DIAGNOSIS — L987 Excessive and redundant skin and subcutaneous tissue: Secondary | ICD-10-CM | POA: Insufficient documentation

## 2020-07-02 DIAGNOSIS — Z888 Allergy status to other drugs, medicaments and biological substances status: Secondary | ICD-10-CM | POA: Insufficient documentation

## 2020-07-02 HISTORY — PX: BRACHIOPLASTY: SHX5755

## 2020-07-02 HISTORY — PX: LIPOSUCTION: SHX10

## 2020-07-02 LAB — POCT PREGNANCY, URINE: Preg Test, Ur: NEGATIVE

## 2020-07-02 SURGERY — BRACHIOPLASTY
Anesthesia: General | Site: Arm Upper | Laterality: Bilateral

## 2020-07-02 MED ORDER — ACETAMINOPHEN 500 MG PO TABS
ORAL_TABLET | ORAL | Status: AC
  Filled 2020-07-02: qty 2

## 2020-07-02 MED ORDER — LACTATED RINGERS IV SOLN
INTRAVENOUS | Status: DC | PRN
  Administered 2020-07-02 (×2): 1000 mL

## 2020-07-02 MED ORDER — AMISULPRIDE (ANTIEMETIC) 5 MG/2ML IV SOLN
INTRAVENOUS | Status: AC
  Filled 2020-07-02: qty 4

## 2020-07-02 MED ORDER — CELECOXIB 200 MG PO CAPS
200.0000 mg | ORAL_CAPSULE | Freq: Once | ORAL | Status: AC
  Administered 2020-07-02: 200 mg via ORAL

## 2020-07-02 MED ORDER — PROPOFOL 10 MG/ML IV BOLUS
INTRAVENOUS | Status: DC | PRN
  Administered 2020-07-02: 200 mg via INTRAVENOUS

## 2020-07-02 MED ORDER — OXYCODONE HCL 5 MG PO TABS
5.0000 mg | ORAL_TABLET | Freq: Once | ORAL | Status: AC | PRN
  Administered 2020-07-02: 5 mg via ORAL

## 2020-07-02 MED ORDER — PROPOFOL 10 MG/ML IV BOLUS
INTRAVENOUS | Status: AC
  Filled 2020-07-02: qty 20

## 2020-07-02 MED ORDER — OXYCODONE HCL 5 MG PO TABS
5.0000 mg | ORAL_TABLET | Freq: Once | ORAL | Status: AC
  Administered 2020-07-02: 5 mg via ORAL

## 2020-07-02 MED ORDER — MIDAZOLAM HCL 5 MG/5ML IJ SOLN
INTRAMUSCULAR | Status: DC | PRN
  Administered 2020-07-02: 2 mg via INTRAVENOUS

## 2020-07-02 MED ORDER — AMISULPRIDE (ANTIEMETIC) 5 MG/2ML IV SOLN
10.0000 mg | Freq: Once | INTRAVENOUS | Status: AC | PRN
  Administered 2020-07-02: 10 mg via INTRAVENOUS

## 2020-07-02 MED ORDER — ACETAMINOPHEN 500 MG PO TABS
1000.0000 mg | ORAL_TABLET | Freq: Once | ORAL | Status: AC
  Administered 2020-07-02: 1000 mg via ORAL

## 2020-07-02 MED ORDER — OXYCODONE HCL 5 MG PO TABS
ORAL_TABLET | ORAL | Status: AC
  Filled 2020-07-02: qty 1

## 2020-07-02 MED ORDER — FENTANYL CITRATE (PF) 100 MCG/2ML IJ SOLN
INTRAMUSCULAR | Status: AC
  Filled 2020-07-02: qty 2

## 2020-07-02 MED ORDER — FENTANYL CITRATE (PF) 250 MCG/5ML IJ SOLN
INTRAMUSCULAR | Status: DC | PRN
  Administered 2020-07-02 (×8): 25 ug via INTRAVENOUS
  Administered 2020-07-02: 50 ug via INTRAVENOUS
  Administered 2020-07-02: 25 ug via INTRAVENOUS

## 2020-07-02 MED ORDER — FENTANYL CITRATE (PF) 100 MCG/2ML IJ SOLN: 25.0000 ug | INTRAMUSCULAR | Status: DC | PRN

## 2020-07-02 MED ORDER — DEXAMETHASONE SODIUM PHOSPHATE 10 MG/ML IJ SOLN
INTRAMUSCULAR | Status: AC
  Filled 2020-07-02: qty 1

## 2020-07-02 MED ORDER — MIDAZOLAM HCL 2 MG/2ML IJ SOLN
INTRAMUSCULAR | Status: AC
  Filled 2020-07-02: qty 2

## 2020-07-02 MED ORDER — ONDANSETRON HCL 4 MG/2ML IJ SOLN
INTRAMUSCULAR | Status: AC
  Filled 2020-07-02: qty 2

## 2020-07-02 MED ORDER — LACTATED RINGERS IV SOLN: INTRAVENOUS | Status: DC

## 2020-07-02 MED ORDER — LIDOCAINE 2% (20 MG/ML) 5 ML SYRINGE
INTRAMUSCULAR | Status: AC
  Filled 2020-07-02: qty 15

## 2020-07-02 MED ORDER — CELECOXIB 200 MG PO CAPS
ORAL_CAPSULE | ORAL | Status: AC
  Filled 2020-07-02: qty 1

## 2020-07-02 MED ORDER — ONDANSETRON HCL 4 MG/2ML IJ SOLN
INTRAMUSCULAR | Status: DC | PRN
  Administered 2020-07-02: 4 mg via INTRAVENOUS

## 2020-07-02 MED ORDER — DEXAMETHASONE SODIUM PHOSPHATE 10 MG/ML IJ SOLN
INTRAMUSCULAR | Status: DC | PRN
  Administered 2020-07-02: 10 mg via INTRAVENOUS

## 2020-07-02 MED ORDER — CLINDAMYCIN PHOSPHATE 900 MG/50ML IV SOLN
900.0000 mg | INTRAVENOUS | Status: AC
  Administered 2020-07-02: 300 mg via INTRAVENOUS
  Administered 2020-07-02: 900 mg via INTRAVENOUS

## 2020-07-02 MED ORDER — OXYCODONE HCL 5 MG/5ML PO SOLN: 5.0000 mg | Freq: Once | ORAL | Status: AC | PRN

## 2020-07-02 MED ORDER — LIDOCAINE 2% (20 MG/ML) 5 ML SYRINGE
INTRAMUSCULAR | Status: DC | PRN
  Administered 2020-07-02: 60 mg via INTRAVENOUS

## 2020-07-02 MED ORDER — CLINDAMYCIN PHOSPHATE 900 MG/50ML IV SOLN
INTRAVENOUS | Status: AC
  Filled 2020-07-02: qty 50

## 2020-07-02 MED FILL — ONDANSETRON HCL 4 MG TABLET: 4 | 6 days supply | Qty: 20 | Fill #0

## 2020-07-02 MED FILL — HYDROCODON-APAP 5-325: 5-325 | 5 days supply | Qty: 20 | Fill #0

## 2020-07-02 SURGICAL SUPPLY — 95 items
ADH SKN CLS APL DERMABOND .7 (GAUZE/BANDAGES/DRESSINGS)
APL PRP STRL LF DISP 70% ISPRP (MISCELLANEOUS) ×4
APL SKNCLS STERI-STRIP NONHPOA (GAUZE/BANDAGES/DRESSINGS) ×8
BAG DECANTER FOR FLEXI CONT (MISCELLANEOUS) ×1 IMPLANT
BENZOIN TINCTURE PRP APPL 2/3 (GAUZE/BANDAGES/DRESSINGS) ×8 IMPLANT
BINDER ABDOMINAL  9 SM 30-45 (SOFTGOODS)
BINDER ABDOMINAL 10 UNV 27-48 (MISCELLANEOUS) IMPLANT
BINDER ABDOMINAL 12 SM 30-45 (SOFTGOODS) IMPLANT
BINDER ABDOMINAL 9 SM 30-45 (SOFTGOODS) IMPLANT
BINDER BREAST LRG (GAUZE/BANDAGES/DRESSINGS) IMPLANT
BINDER BREAST MEDIUM (GAUZE/BANDAGES/DRESSINGS) IMPLANT
BINDER BREAST XLRG (GAUZE/BANDAGES/DRESSINGS) IMPLANT
BINDER BREAST XXLRG (GAUZE/BANDAGES/DRESSINGS) IMPLANT
BLADE CLIPPER SURG (BLADE) ×2 IMPLANT
BLADE HEX COATED 2.75 (ELECTRODE) ×1 IMPLANT
BLADE SURG 10 STRL SS (BLADE) ×5 IMPLANT
BLADE SURG 11 STRL SS (BLADE) IMPLANT
BLADE SURG 15 STRL LF DISP TIS (BLADE) ×3 IMPLANT
BLADE SURG 15 STRL SS (BLADE) ×6
BNDG COHESIVE 4X5 TAN STRL (GAUZE/BANDAGES/DRESSINGS) ×2 IMPLANT
BNDG ELASTIC 4X5.8 VLCR STR LF (GAUZE/BANDAGES/DRESSINGS) ×4 IMPLANT
BNDG ELASTIC 6X5.8 VLCR STR LF (GAUZE/BANDAGES/DRESSINGS) ×4 IMPLANT
BNDG GAUZE ELAST 4 BULKY (GAUZE/BANDAGES/DRESSINGS) ×6 IMPLANT
CANISTER SUCT 1200ML W/VALVE (MISCELLANEOUS) ×3 IMPLANT
CHLORAPREP W/TINT 26 (MISCELLANEOUS) ×6 IMPLANT
COVER BACK TABLE 60X90IN (DRAPES) ×3 IMPLANT
COVER MAYO STAND STRL (DRAPES) ×3 IMPLANT
COVER WAND RF STERILE (DRAPES) IMPLANT
DECANTER SPIKE VIAL GLASS SM (MISCELLANEOUS) IMPLANT
DERMABOND ADVANCED (GAUZE/BANDAGES/DRESSINGS)
DERMABOND ADVANCED .7 DNX12 (GAUZE/BANDAGES/DRESSINGS) ×1 IMPLANT
DRAIN CHANNEL 15F RND FF W/TCR (WOUND CARE) IMPLANT
DRAPE LAPAROSCOPIC ABDOMINAL (DRAPES) ×1 IMPLANT
DRAPE TOP ARMCOVERS (MISCELLANEOUS) ×1 IMPLANT
DRAPE U-SHAPE 76X120 STRL (DRAPES) ×5 IMPLANT
DRAPE UTILITY XL STRL (DRAPES) ×3 IMPLANT
DRSG ADAPTIC 3X8 NADH LF (GAUZE/BANDAGES/DRESSINGS) IMPLANT
DRSG EMULSION OIL 3X3 NADH (GAUZE/BANDAGES/DRESSINGS) IMPLANT
DRSG PAD ABDOMINAL 8X10 ST (GAUZE/BANDAGES/DRESSINGS) ×10 IMPLANT
ELECT BLADE 4.0 EZ CLEAN MEGAD (MISCELLANEOUS)
ELECT COATED BLADE 2.86 ST (ELECTRODE) ×1 IMPLANT
ELECT REM PT RETURN 9FT ADLT (ELECTROSURGICAL) ×3
ELECTRODE BLDE 4.0 EZ CLN MEGD (MISCELLANEOUS) IMPLANT
ELECTRODE REM PT RTRN 9FT ADLT (ELECTROSURGICAL) ×2 IMPLANT
EVACUATOR SILICONE 100CC (DRAIN) IMPLANT
GLOVE BIO SURGEON STRL SZ 6 (GLOVE) ×1 IMPLANT
GLOVE BIO SURGEON STRL SZ 6.5 (GLOVE) ×2 IMPLANT
GLOVE BIO SURGEON STRL SZ7 (GLOVE) ×6 IMPLANT
GLOVE BIOGEL M STRL SZ7.5 (GLOVE) ×2 IMPLANT
GLOVE ECLIPSE 6.5 STRL STRAW (GLOVE) ×4 IMPLANT
GOWN STRL REUS W/ TWL LRG LVL3 (GOWN DISPOSABLE) ×8 IMPLANT
GOWN STRL REUS W/TWL LRG LVL3 (GOWN DISPOSABLE) ×18
LINER CANISTER 1000CC FLEX (MISCELLANEOUS) ×5 IMPLANT
NDL HYPO 25X1 1.5 SAFETY (NEEDLE) IMPLANT
NDL SAFETY ECLIPSE 18X1.5 (NEEDLE) ×2 IMPLANT
NDL SPNL 18GX3.5 QUINCKE PK (NEEDLE) ×1 IMPLANT
NEEDLE HYPO 18GX1.5 SHARP (NEEDLE) ×3
NEEDLE HYPO 25X1 1.5 SAFETY (NEEDLE) IMPLANT
NEEDLE SPNL 18GX3.5 QUINCKE PK (NEEDLE) ×3 IMPLANT
NS IRRIG 1000ML POUR BTL (IV SOLUTION) ×2 IMPLANT
PACK BASIN DAY SURGERY FS (CUSTOM PROCEDURE TRAY) ×3 IMPLANT
PAD ALCOHOL SWAB (MISCELLANEOUS) ×1 IMPLANT
PENCIL SMOKE EVACUATOR (MISCELLANEOUS) ×3 IMPLANT
PIN SAFETY STERILE (MISCELLANEOUS) ×1 IMPLANT
SHEET MEDIUM DRAPE 40X70 STRL (DRAPES) ×5 IMPLANT
SLEEVE SCD COMPRESS KNEE MED (MISCELLANEOUS) ×3 IMPLANT
SPONGE LAP 18X18 RF (DISPOSABLE) ×7 IMPLANT
STAPLER INSORB 30 2030 C-SECTI (MISCELLANEOUS) ×8 IMPLANT
STAPLER VISISTAT 35W (STAPLE) ×9 IMPLANT
STOCKINETTE IMPERVIOUS LG (DRAPES) ×4 IMPLANT
STRIP CLOSURE SKIN 1/2X4 (GAUZE/BANDAGES/DRESSINGS) ×6 IMPLANT
SUT CHROMIC 4 0 PS 2 18 (SUTURE) IMPLANT
SUT ETHILON 2 0 FS 18 (SUTURE) IMPLANT
SUT MNCRL AB 3-0 PS2 18 (SUTURE) IMPLANT
SUT MNCRL AB 4-0 PS2 18 (SUTURE) ×4 IMPLANT
SUT MON AB 5-0 PS2 18 (SUTURE) ×2 IMPLANT
SUT PLAIN 5 0 P 3 18 (SUTURE) IMPLANT
SUT PROLENE 4 0 PS 2 18 (SUTURE) IMPLANT
SUT SILK 2 0 PERMA HAND 18 BK (SUTURE) IMPLANT
SUT VIC AB 3-0 PS1 18 (SUTURE)
SUT VIC AB 3-0 PS1 18XBRD (SUTURE) IMPLANT
SUT VIC AB 3-0 SH 27 (SUTURE)
SUT VIC AB 3-0 SH 27X BRD (SUTURE) IMPLANT
SUT VICRYL 4-0 PS2 18IN ABS (SUTURE) IMPLANT
SUT VLOC 90 P-14 23 (SUTURE) ×6 IMPLANT
SYR 50ML LL SCALE MARK (SYRINGE) ×3 IMPLANT
SYR BULB IRRIG 60ML STRL (SYRINGE) ×1 IMPLANT
SYR CONTROL 10ML LL (SYRINGE) IMPLANT
SYR TB 1ML LL NO SAFETY (SYRINGE) ×1 IMPLANT
TOWEL GREEN STERILE FF (TOWEL DISPOSABLE) ×6 IMPLANT
TUBE CONNECTING 20X1/4 (TUBING) ×3 IMPLANT
TUBING INFILTRATION IT-10001 (TUBING) ×3 IMPLANT
TUBING SET GRADUATE ASPIR 12FT (MISCELLANEOUS) ×3 IMPLANT
UNDERPAD 30X36 HEAVY ABSORB (UNDERPADS AND DIAPERS) ×6 IMPLANT
YANKAUER SUCT BULB TIP NO VENT (SUCTIONS) ×3 IMPLANT

## 2020-07-02 NOTE — Transfer of Care (Signed)
Immediate Anesthesia Transfer of Care Note  Patient: Donna Mccarthy  Procedure(s) Performed: BILATERAL BRACHIOPLASTY WITH LIPOSUCTION (Bilateral Arm Upper) LIPOSUCTION (Bilateral Arm Upper)  Patient Location: PACU  Anesthesia Type:General  Level of Consciousness: sedated  Airway & Oxygen Therapy: Patient Spontanous Breathing and Patient connected to face mask oxygen  Post-op Assessment: Report given to RN and Post -op Vital signs reviewed and stable  Post vital signs: Reviewed and stable  Last Vitals:  Vitals Value Taken Time  BP 118/73 07/02/20 1604  Temp    Pulse 56 07/02/20 1606  Resp 11 07/02/20 1606  SpO2 100 % 07/02/20 1606  Vitals shown include unvalidated device data.  Last Pain:  Vitals:   07/02/20 1116  TempSrc: Oral  PainSc: 0-No pain         Complications: No complications documented.

## 2020-07-02 NOTE — Discharge Instructions (Addendum)
Activity As tolerated: NO showers for 3 days No heavy activities  Diet: Regular  Wound Care: Keep dressing clean & dry for 3 days.  Keep wrap applied with compression as much as possible 24/7.    Do not change dressings for 3 days unless soiled.  Can change if needed but make sure to reapply wrap. After three days can remove wrap and shower.  Then reapply dressings if needed and continue compression with wrap or soft arm compression sleeves.  Call doctor if any unusual problems occur such as pain, excessive bleeding, unrelieved nausea/vomiting, fever &/or chills  Follow-up appointment: Scheduled for Nov 22   *No Tylenol until after 6pm *No Ibuprofen/motrin products until after 8pm  Post Anesthesia Home Care Instructions  Activity: Get plenty of rest for the remainder of the day. A responsible individual must stay with you for 24 hours following the procedure.  For the next 24 hours, DO NOT: -Drive a car -Advertising copywriter -Drink alcoholic beverages -Take any medication unless instructed by your physician -Make any legal decisions or sign important papers.  Meals: Start with liquid foods such as gelatin or soup. Progress to regular foods as tolerated. Avoid greasy, spicy, heavy foods. If nausea and/or vomiting occur, drink only clear liquids until the nausea and/or vomiting subsides. Call your physician if vomiting continues.  Special Instructions/Symptoms: Your throat may feel dry or sore from the anesthesia or the breathing tube placed in your throat during surgery. If this causes discomfort, gargle with warm salt water. The discomfort should disappear within 24 hours.  If you had a scopolamine patch placed behind your ear for the management of post- operative nausea and/or vomiting:  1. The medication in the patch is effective for 72 hours, after which it should be removed.  Wrap patch in a tissue and discard in the trash. Wash hands thoroughly with soap and water. 2. You  may remove the patch earlier than 72 hours if you experience unpleasant side effects which may include dry mouth, dizziness or visual disturbances. 3. Avoid touching the patch. Wash your hands with soap and water after contact with the patch.

## 2020-07-02 NOTE — Op Note (Signed)
Operative Note   DATE OF OPERATION: 07/02/2020  SURGICAL DEPARTMENT: Plastic Surgery  PREOPERATIVE DIAGNOSES: Bilateral upper extremity skin excess  POSTOPERATIVE DIAGNOSES:  same  PROCEDURE: Bilateral Lipo brachioplasty  SURGEON: Ancil Linsey, MD  ASSISTANT: Joni Fears, PA and Zadie Cleverly, [place] The advanced practice practitioner (APP) assisted throughout the case.  The APP was essential in retraction and counter traction when needed to make the case progress smoothly.  This retraction and assistance made it possible to see the tissue plans for the procedure.  The assistance was needed for blood control, tissue re-approximation and assisted with closure of the incision site.  ANESTHESIA:  General.   COMPLICATIONS: None.   INDICATIONS FOR PROCEDURE:  The patient, Donna Mccarthy is a 44 y.o. female born on [birth year], is here for treatment of symptomatic excess upper extremity skin after massive weight loss MRN: [id number]  CONSENT:  Informed consent was obtained directly from the patient. Risks, benefits and alternatives were fully discussed. Specific risks including but not limited to bleeding, infection, hematoma, seroma, scarring, pain, contracture, asymmetry, wound healing problems, and need for further surgery were all discussed. The patient did have an ample opportunity to have questions answered to satisfaction.   DESCRIPTION OF PROCEDURE:  The patient was taken to the operating room. SCDs were placed and Ancef antibiotics were given.  General anesthesia was administered.  The patient's operative site was prepped and draped in a sterile fashion. A time out was performed and all information was confirmed to be correct.  I started by marking out the approximate location of the anticipated scar on both sides.  She had lost quite a bit of fat and this was mostly skin excess so I estimated the approximate excision.  The scar went from the medial epicondyle up towards the  axilla and it did come off onto the chest wall a bit to take out the remainder the excess.  I then infiltrated tumescent solution on both sides about 1 L per side.  This was given time to work.  I then performed power assisted liposuction fairly aggressively in the areas of excision on both sides.  I got about 600 cc of Lipo aspirate out on each side.  Pickups and a skin stapler were used to tack the skin and plan the access and ensure symmetry and keep the scar on the medial aspect of the arm where it would hopefully not be seen from the front or the back with the arm at her side.  The amount of excision was then marked with a marking pen and the staples were removed.  Incisions were then made with a 15 blade and avulsion technique was used to avulse the skin leaving the underlying lymphatics and vasculature.  The skin was then stapled closed and symmetry was ensured on both sides.  Closure was then done with buried Enzor stapler and running 3 oh VueLock suture.  Steri-Strips and compressive wraps were then applied.  The patient tolerated the procedure well.  There were no complications. The patient was allowed to wake from anesthesia, extubated and taken to the recovery room in satisfactory condition.

## 2020-07-02 NOTE — Anesthesia Preprocedure Evaluation (Signed)
Anesthesia Evaluation  Patient identified by MRN, date of birth, ID band Patient awake    Reviewed: Allergy & Precautions, NPO status , Patient's Chart, lab work & pertinent test results  Airway Mallampati: II  TM Distance: >3 FB Neck ROM: Full    Dental  (+) Dental Advisory Given   Pulmonary neg pulmonary ROS,    breath sounds clear to auscultation       Cardiovascular negative cardio ROS   Rhythm:Regular Rate:Normal     Neuro/Psych  Headaches,    GI/Hepatic Neg liver ROS, GERD  ,  Endo/Other  Hypothyroidism   Renal/GU negative Renal ROS     Musculoskeletal  (+) Arthritis ,   Abdominal   Peds  Hematology negative hematology ROS (+)   Anesthesia Other Findings   Reproductive/Obstetrics                             Anesthesia Physical Anesthesia Plan  ASA: II  Anesthesia Plan: General   Post-op Pain Management:    Induction: Intravenous  PONV Risk Score and Plan: 3 and Midazolam, Dexamethasone, Ondansetron and Treatment may vary due to age or medical condition  Airway Management Planned: LMA  Additional Equipment:   Intra-op Plan:   Post-operative Plan: Extubation in OR  Informed Consent: I have reviewed the patients History and Physical, chart, labs and discussed the procedure including the risks, benefits and alternatives for the proposed anesthesia with the patient or authorized representative who has indicated his/her understanding and acceptance.     Dental advisory given  Plan Discussed with: CRNA  Anesthesia Plan Comments:         Anesthesia Quick Evaluation

## 2020-07-02 NOTE — Brief Op Note (Signed)
07/02/2020  4:11 PM  PATIENT:  Donna Mccarthy  44 y.o. female  PRE-OPERATIVE DIAGNOSIS:  Skin Ulcer Of Upper Arm, Limited To Breakdown Of Skin  POST-OPERATIVE DIAGNOSIS:  EXCESS ARM SKIN  PROCEDURE:  Procedure(s): BILATERAL BRACHIOPLASTY WITH LIPOSUCTION (Bilateral) LIPOSUCTION (Bilateral)  SURGEON:  Surgeon(s) and Role:    * Megen Madewell, Wendy Poet, MD - Primary  PHYSICIAN ASSISTANT: Joni Fears, PA and Materials engineer, PA  ASSISTANTS: none   ANESTHESIA:   general  EBL:  30 mL   BLOOD ADMINISTERED:none  DRAINS: none   LOCAL MEDICATIONS USED:  MARCAINE     SPECIMEN:  Source of Specimen:  right and left arm tissue  DISPOSITION OF SPECIMEN:  PATHOLOGY  COUNTS:  YES  TOURNIQUET:  * No tourniquets in log *  DICTATION: .Dragon Dictation  PLAN OF CARE: Discharge to home after PACU  PATIENT DISPOSITION:  PACU - hemodynamically stable.   Delay start of Pharmacological VTE agent (>24hrs) due to surgical blood loss or risk of bleeding: not applicable

## 2020-07-02 NOTE — Anesthesia Procedure Notes (Signed)
Procedure Name: LMA Insertion Date/Time: 07/02/2020 12:52 PM Performed by: Lucinda Dell, CRNA Pre-anesthesia Checklist: Patient identified, Emergency Drugs available, Suction available and Patient being monitored Patient Re-evaluated:Patient Re-evaluated prior to induction Oxygen Delivery Method: Circle system utilized Preoxygenation: Pre-oxygenation with 100% oxygen Induction Type: IV induction Ventilation: Mask ventilation without difficulty LMA: LMA inserted LMA Size: 4.0 Number of attempts: 1 Placement Confirmation: positive ETCO2 Tube secured with: Tape Dental Injury: Teeth and Oropharynx as per pre-operative assessment

## 2020-07-02 NOTE — Interval H&P Note (Signed)
History and Physical Interval Note:  07/02/2020 10:57 AM  Donna Mccarthy  has presented today for surgery, with the diagnosis of Skin Ulcer Of Upper Arm, Limited To Breakdown Of Skin.  The various methods of treatment have been discussed with the patient and family. After consideration of risks, benefits and other options for treatment, the patient has consented to  Procedure(s): BILATERAL BRACHIOPLASTY WITH LIPOSUCTION (Bilateral) LIPOSUCTION (Bilateral) as a surgical intervention.  The patient's history has been reviewed, patient examined, no change in status, stable for surgery.  I have reviewed the patient's chart and labs.  Questions were answered to the patient's satisfaction.     [name]

## 2020-07-03 LAB — SURGICAL PATHOLOGY

## 2020-07-03 NOTE — Anesthesia Postprocedure Evaluation (Signed)
Anesthesia Post Note  Patient: Donna Mccarthy  Procedure(s) Performed: BILATERAL BRACHIOPLASTY WITH LIPOSUCTION (Bilateral Arm Upper) LIPOSUCTION (Bilateral Arm Upper)     Patient location during evaluation: PACU Anesthesia Type: General Level of consciousness: awake and alert Pain management: pain level controlled Vital Signs Assessment: post-procedure vital signs reviewed and stable Respiratory status: spontaneous breathing, nonlabored ventilation, respiratory function stable and patient connected to nasal cannula oxygen Cardiovascular status: blood pressure returned to baseline and stable Postop Assessment: no apparent nausea or vomiting Anesthetic complications: no   No complications documented.  Last Vitals:  Vitals:   07/02/20 1645 07/02/20 1715  BP: 126/67 121/71  Pulse: (!) 58 77  Resp: 14 18  Temp:  36.4 C  SpO2: 100% 100%    Last Pain:  Vitals:   07/02/20 1630  TempSrc:   PainSc: 0-No pain                 Julieann Drummonds

## 2020-07-04 ENCOUNTER — Encounter: Payer: No Typology Code available for payment source | Admitting: Plastic Surgery

## 2020-07-04 ENCOUNTER — Encounter (HOSPITAL_BASED_OUTPATIENT_CLINIC_OR_DEPARTMENT_OTHER): Payer: Self-pay | Admitting: Plastic Surgery

## 2020-07-05 ENCOUNTER — Telehealth: Payer: Self-pay

## 2020-07-05 NOTE — Telephone Encounter (Signed)
Patient called to let us know that she took a shower today and her bandages were changed by a friend of hers who is a Engineer, civil (consulting). Patient would like to know how often she needs to change her bandages.  Also, she would like to know if she should continue wearing the bandages or should she use the compression garment instead?  Please call.

## 2020-07-06 NOTE — Telephone Encounter (Signed)
Returned patients call. Advised her to change dressings as needed. She may put a non stick pads of the incision area for protection. She is to use the ace wrap that was given to her 24/7 fitted snug. Patient understood and agreed.

## 2020-07-07 ENCOUNTER — Other Ambulatory Visit (HOSPITAL_COMMUNITY): Payer: No Typology Code available for payment source

## 2020-07-09 ENCOUNTER — Encounter: Payer: Self-pay | Admitting: Surgical

## 2020-07-09 ENCOUNTER — Ambulatory Visit (INDEPENDENT_AMBULATORY_CARE_PROVIDER_SITE_OTHER): Payer: No Typology Code available for payment source | Admitting: Surgical

## 2020-07-09 ENCOUNTER — Other Ambulatory Visit: Payer: Self-pay

## 2020-07-09 ENCOUNTER — Encounter: Payer: No Typology Code available for payment source | Admitting: Surgical

## 2020-07-09 VITALS — HR 56 | Temp 98.2°F

## 2020-07-09 DIAGNOSIS — L98491 Non-pressure chronic ulcer of skin of other sites limited to breakdown of skin: Secondary | ICD-10-CM

## 2020-07-09 MED FILL — NP THYROID 30 MG TABLET: 30 | 30 days supply | Qty: 30 | Fill #2

## 2020-07-09 MED FILL — NP THYROID 120 MG TABLET: 120 | 30 days supply | Qty: 30 | Fill #1

## 2020-07-09 NOTE — Progress Notes (Signed)
Patient is a 44 year old female here for follow-up after bilateral brachioplasty with liposuction by Dr. Arita Miss on 07/02/2020.  Patient reports that she is overall doing okay, pain has been a little bit more than expected.  She reports no infectious symptoms.  She does report that she feels as if the right side is larger and less tight.  She reports she has received compression sleeves in the mail that she ordered that she is planning to use.  She reports that she is scheduled to return to work on 07/17/2020, however does not have her next follow-up with our office until 07/19/2020.  Chaperone present on exam On exam bilateral brachioplasty incisions intact, no dehiscence noted, Steri-Strips still in place along the distal incisions of bilateral arms.  Bruising noted throughout bilateral arms, appears to be resolving and is as expected.  No drainage noted.  No erythema noted.  No cellulitic changes noted.  -Recommend continuing to keep her arms wrapped with Ace wraps.  She can transition to wearing compressive sleeves.  Should wear this 24/7. -Place padding on lateral chest between bra and incision to prevent irritation -Avoid heavy lifting or strenuous activity.  We can provide patient with a work note until 07/20/2020 so she can be evaluated prior to returning to work to be sure she has no postoperative complications preventing her from work.

## 2020-07-11 ENCOUNTER — Encounter (HOSPITAL_COMMUNITY): Admission: RE | Payer: Self-pay | Source: Home / Self Care

## 2020-07-11 ENCOUNTER — Ambulatory Visit (HOSPITAL_COMMUNITY)
Admission: RE | Admit: 2020-07-11 | Payer: No Typology Code available for payment source | Source: Home / Self Care | Admitting: Gastroenterology

## 2020-07-11 SURGERY — MANOMETRY, ESOPHAGUS
Anesthesia: Choice

## 2020-07-18 ENCOUNTER — Encounter: Payer: Self-pay | Admitting: Surgical

## 2020-07-19 ENCOUNTER — Encounter: Payer: Self-pay | Admitting: Plastic Surgery

## 2020-07-19 ENCOUNTER — Other Ambulatory Visit: Payer: Self-pay

## 2020-07-19 ENCOUNTER — Ambulatory Visit (INDEPENDENT_AMBULATORY_CARE_PROVIDER_SITE_OTHER): Payer: No Typology Code available for payment source | Admitting: Plastic Surgery

## 2020-07-19 ENCOUNTER — Encounter: Payer: Self-pay | Admitting: Surgical

## 2020-07-19 VITALS — HR 76 | Temp 97.9°F

## 2020-07-19 DIAGNOSIS — L98491 Non-pressure chronic ulcer of skin of other sites limited to breakdown of skin: Secondary | ICD-10-CM

## 2020-07-19 NOTE — Progress Notes (Signed)
Patient is here 2 weeks postop from bilateral brachioplasty.  She overall feels like she is doing well.  She feels like the right side is a little bit more swollen than the left side but otherwise is reasonably happy with the results.  She feels like the discrepancy in the swelling in size of the right size compared to the left has improved quite a bit in the last week.  On examination her incisions are healing fine with no signs of any wound breakdown.  I cannot palpate any subcutaneous fluid collections.  She feels like she might have a little bit of fluid in the right axilla but I am unable to appreciate this on exam.  I do think the right side is a little bit more swollen than the left particularly as it approaches the elbow hopefully this will smooth out with time.  I have asked her to continue to wear compressive sleeves for at least another month pretty much all the time.  She is overall very happy and very appreciative of the panniculectomy in brachioplasty operations and feels much better about herself.  We will plan to see her again in a month.  She did ask about her return to work restrictions.  At the moment she is scheduled to return to work in 2 weeks without restrictions.  I have asked her to see how the next week goes and if she feels uncomfortable performing her job at a month postop then we could add some restrictions to avoid heavy heavy lifting when she goes back to work if she wanted.  She will let us know if any modifications need to be made for her work paperwork.

## 2020-07-20 MED FILL — TOPIRAMATE 25 MG TAB: 25 | 30 days supply | Qty: 90 | Fill #2

## 2020-07-27 ENCOUNTER — Telehealth: Payer: Self-pay | Admitting: Plastic Surgery

## 2020-07-27 NOTE — Telephone Encounter (Signed)
Patient had a brachioplasty a few weeks ago and she would like to know if she can start using deodorant. Please call back to advise

## 2020-07-30 NOTE — Telephone Encounter (Signed)
Call returned to pt re:  She asked if she could start wearing deodorant now. I consulted with Dr. Arita Miss & he confirmed that she can now wear deodorant. Pt understands the above.we did discuss how she is doing with from the bilateral brachioplasty & she is having increased swelling both arms- but right is larger than left She is also having numbness in right thumb & weakness in her right hand ( which is her dominant hand) She continues to use the compression garments & is elevating both arms. I told her that I would inform Dr. Arita Miss of these symptoms. She will keep her f/u appointment- or call if symptoms worsen.

## 2020-08-09 MED FILL — NP THYROID 120 MG TABLET: 120 | 30 days supply | Qty: 30 | Fill #2

## 2020-08-09 MED FILL — NP THYROID 30 MG TABLET: 30 | 30 days supply | Qty: 30 | Fill #3

## 2020-08-18 HISTORY — PX: DILATION AND CURETTAGE OF UTERUS: SHX78

## 2020-08-22 ENCOUNTER — Ambulatory Visit (INDEPENDENT_AMBULATORY_CARE_PROVIDER_SITE_OTHER): Payer: No Typology Code available for payment source | Admitting: Surgical

## 2020-08-22 ENCOUNTER — Encounter: Payer: Self-pay | Admitting: Surgical

## 2020-08-22 ENCOUNTER — Other Ambulatory Visit: Payer: Self-pay

## 2020-08-22 VITALS — BP 125/78 | HR 83 | Temp 97.8°F | Ht 66.0 in | Wt 177.0 lb

## 2020-08-22 DIAGNOSIS — L98491 Non-pressure chronic ulcer of skin of other sites limited to breakdown of skin: Secondary | ICD-10-CM

## 2020-08-22 NOTE — Progress Notes (Signed)
Patient is a 45 year old female here for follow-up after bilateral brachioplasty with Dr. Arita Miss.  She reports that overall she is doing well, she still has a little bit of swelling on the right side, but is otherwise happy with how things are going.  She reports that she has some tenderness still within the axillary regions.  She is not having any infectious symptoms.  She reports that sometimes at night she has some night sweats, but no fever.  She reports that the left axillary region is slightly more painful than the right.  She has continued to wear the compressive garments on her arms. She reports that she has had some bloating, reports some constipation.  Chaperone present on exam On exam bilateral brachioplasty incisions intact, she does have what appears to be a little bit of scar thickening, no keloiding noted.  No wounds noted.  No erythema noted.  Some swelling of the right side.  Recommend continue wear compressive garments when active, no longer necessary to wear at night or when at home resting. Patient can begin exercising, slowly increase as able, wear compressive garments when exercising. Discussed use of scar creams. Discussed using MiraLAX continuously for a minimum of 1 to 2 weeks to assist with regulating bowel movements.  I recommend she follow-up in 2 to 4 months for reevaluation.  She has no restrictions at this time, recommend she calls [place] with any questions or concerns.  There is no sign of infection, seroma, hematoma.

## 2020-08-28 LAB — HM PAP SMEAR: HM Pap smear: NORMAL

## 2020-08-28 LAB — RESULTS CONSOLE HPV: CHL HPV: NEGATIVE

## 2020-08-29 ENCOUNTER — Telehealth: Payer: Self-pay | Admitting: Surgical

## 2020-08-29 NOTE — Telephone Encounter (Signed)
Patient called to see if it was okay for her to have a mammogram since she had surgery on her arms. She wanted to be sure it was okay. Please call her to advise.

## 2020-09-04 ENCOUNTER — Telehealth: Payer: Self-pay

## 2020-09-04 LAB — HM PAP SMEAR: HM Pap smear: NORMAL

## 2020-09-04 LAB — RESULTS CONSOLE HPV: CHL HPV: NEGATIVE

## 2020-09-04 NOTE — Telephone Encounter (Signed)
08/30/20-  Call returned to pt regarding her question about having mammogram. Pt is requesting recommendation to have her annual mammogram- I consulted with Dr. Arita Miss & he approved that pt can have mammogram now without any complications from her surgery on 06/28/20. Pt understands the above recommendation- she will schedule her mammogram now. She reports that she is doing well with her surgery.  She is healing and doesn't have any complications. She continues to wear the compression garment as needed. She is reminded to call for any concerns- otherwise she has a f/u visit with Dr. Arita Miss on 11/21/20

## 2020-09-06 MED FILL — VITAMIN D3 5,000 UNIT CAP: 125 MCG | 100 days supply | Qty: 100 | Fill #1

## 2020-09-06 MED FILL — TOPIRAMATE 25 MG TAB: 25 | 30 days supply | Qty: 90 | Fill #3

## 2020-09-06 MED FILL — NP THYROID 30 MG TABLET: 30 | 30 days supply | Qty: 30 | Fill #4

## 2020-09-06 MED FILL — NP THYROID 120 MG TABLET: 120 | 30 days supply | Qty: 30 | Fill #3

## 2020-09-10 ENCOUNTER — Other Ambulatory Visit: Payer: Self-pay

## 2020-09-10 ENCOUNTER — Telehealth (INDEPENDENT_AMBULATORY_CARE_PROVIDER_SITE_OTHER): Payer: No Typology Code available for payment source | Admitting: Family Medicine

## 2020-09-10 ENCOUNTER — Encounter: Payer: Self-pay | Admitting: Family Medicine

## 2020-09-10 VITALS — HR 59 | Temp 97.8°F | Wt 174.0 lb

## 2020-09-10 DIAGNOSIS — R519 Headache, unspecified: Secondary | ICD-10-CM

## 2020-09-10 DIAGNOSIS — J3489 Other specified disorders of nose and nasal sinuses: Secondary | ICD-10-CM

## 2020-09-10 DIAGNOSIS — R197 Diarrhea, unspecified: Secondary | ICD-10-CM | POA: Diagnosis not present

## 2020-09-10 DIAGNOSIS — R0981 Nasal congestion: Secondary | ICD-10-CM | POA: Diagnosis not present

## 2020-09-10 DIAGNOSIS — R109 Unspecified abdominal pain: Secondary | ICD-10-CM

## 2020-09-10 NOTE — Progress Notes (Signed)
   Subjective:  Documentation for virtual audio and video telecommunications through Caregility encounter:  The patient was located at home. 2 patient identifiers used.  The provider was located in the office. The patient did consent to this visit and is aware of possible charges through their insurance for this visit.  The other persons participating in this telemedicine service were none. Time spent on call was 10 minutes and in review of previous records 12 minutes total.  This virtual service is not related to other E/M service within previous 7 days.   Patient ID: Donna Mccarthy, female    DOB: 1975/10/25, 45 y.o.   MRN: [id number]  HPI Chief Complaint  Patient presents with  . sick    Diarrhea and abdominal pain over the weekend and headache started this morning   Complains of a 3 day history of abdominal spasms and diarrhea. 2 episodes of diarrhea. No blood or pus.  This is not as bothersome today but states she woke up with a pulsating headache on the left side. She has also rhinorrhea and nasal congestion.  States she was exposed someone who tested positive for Covid.   No fever, chills, body aches, sore throat, chest pain, shortness of breath, or vomiting.    States she has had 2 Pfizer Covid vaccines.  She did not get a booster.  She works for [employer].  States she did contact [facility] and told them about her diarrhea and headache but did not meet criteria for Covid testing.  She did not have nasal congestion or rhinorrhea at the time.     Review of Systems Pertinent positives and negatives in the history of present illness.     Objective:   Physical Exam Pulse (!) 59   Temp 97.8 F (36.6 C)   Wt 174 lb (78.9 kg)   LMP 08/13/2020 (Exact Date)   BMI 28.08 kg/m   Alert and oriented in no acute distress.  Respirations unlabored.  Normal facial movements and symmetry.  Normal speech.      Assessment & Plan:  Acute nonintractable headache, unspecified  headache type  Nasal congestion with rhinorrhea  Diarrhea, unspecified type  Abdominal spasms  Encouraged her to call health at work back since she does work for [employer] and let them know that she is also having nasal congestion and rhinorrhea and that I recommend she get tested for Covid.  Discussed that I am okay testing her here my parking lot but that she should follow protocol for [facility] as well.  Discussed symptomatic treatment.  She will follow-up with me if she does need testing or if she is worsening.

## 2020-09-24 ENCOUNTER — Telehealth: Payer: Self-pay

## 2020-09-24 NOTE — Telephone Encounter (Signed)
Called and spoke with the patient regarding the message below. Informed the patient that I spoke with Dr. Arita Miss and he stated that she can come in and have someone look at her arm.  He stated he's not sure if its coming from the brachioplasty surgery since the surgery was done (07/03/20).  Informed the patient that someone will give her a call regarding an appointment.  Patient verbalized understanding and agreed.  The patient asked if she could go see her primary care.  Informed the patient that she could.//AB/CMA

## 2020-09-24 NOTE — Telephone Encounter (Signed)
Patient called to say that she is experiencing pain that runs from her shoulder to the bend in her elbow.  She said that she also has a tender spot on the outside of her arm.  Patient said all of this is on her right side and it started this past weekend.  She is wondering if she needs to be seen.  Please call.

## 2020-10-12 MED FILL — NP THYROID 120 MG TABLET: 120 | 30 days supply | Qty: 30 | Fill #4

## 2020-10-12 MED FILL — NP THYROID 30 MG TABLET: 30 | 30 days supply | Qty: 30 | Fill #5

## 2020-10-17 ENCOUNTER — Other Ambulatory Visit: Payer: Self-pay | Admitting: Family Medicine

## 2020-10-17 ENCOUNTER — Ambulatory Visit (INDEPENDENT_AMBULATORY_CARE_PROVIDER_SITE_OTHER): Payer: No Typology Code available for payment source | Admitting: Plastic Surgery

## 2020-10-17 ENCOUNTER — Encounter: Payer: Self-pay | Admitting: Plastic Surgery

## 2020-10-17 ENCOUNTER — Other Ambulatory Visit: Payer: Self-pay | Admitting: Plastic Surgery

## 2020-10-17 ENCOUNTER — Other Ambulatory Visit: Payer: Self-pay

## 2020-10-17 VITALS — BP 113/73 | HR 69

## 2020-10-17 DIAGNOSIS — G43909 Migraine, unspecified, not intractable, without status migrainosus: Secondary | ICD-10-CM

## 2020-10-17 DIAGNOSIS — L98491 Non-pressure chronic ulcer of skin of other sites limited to breakdown of skin: Secondary | ICD-10-CM

## 2020-10-17 MED ORDER — CELECOXIB 200 MG PO CAPS: 200.0000 mg | ORAL_CAPSULE | Freq: Two times a day (BID) | ORAL | 1 refills | Status: DC

## 2020-10-17 MED FILL — CELECOXIB 200 MG CAP: 200 | 30 days supply | Qty: 60 | Fill #0

## 2020-10-17 NOTE — Progress Notes (Signed)
Patient presents several months postop from bilateral brachioplasty.  She is here primarily to discuss some pain in her right deltoid.  She feels like it is tender in that area and wonders if carrying her laptop at work is influencing that problem.  She is also bothered by some excess skin bunching up around her elbow on both sides.  On examination she is tender right at the deltoid insertion into the humerus.  There is nothing in terms of skin changes or redness in this area.  She does have a little bit of excess skin posteriorly on the arms at the level of the elbow.  I suspect the pain in her shoulder is musculoskeletal and will give her Celebrex for a while to try for a few weeks.  She has tried ibuprofen with some relief.  I think this will help with that pain and it is possible that it is exacerbated by her caring the computer at work.  For the excess skin I think this is just a little bit of redundancy.  She had a very large brachioplasty with a significant amount of excess skin.  It is healing well otherwise but I suspect this is not going to resolve for her over time.  We did discuss the potential of a revision where I could T the axilla to bring that skin more superiorly but at the moment she does not seem to interested in additional procedures.  We will plan to see her again in a few months or as needed.

## 2020-10-18 ENCOUNTER — Other Ambulatory Visit: Payer: Self-pay | Admitting: Family Medicine

## 2020-10-18 ENCOUNTER — Encounter: Payer: Self-pay | Admitting: Family Medicine

## 2020-10-18 MED FILL — TOPIRAMATE 25 MG TAB: 25 | 30 days supply | Qty: 90 | Fill #0

## 2020-11-06 ENCOUNTER — Telehealth: Payer: Self-pay | Admitting: Internal Medicine

## 2020-11-06 ENCOUNTER — Telehealth: Payer: PRIVATE HEALTH INSURANCE | Admitting: Family Medicine

## 2020-11-06 ENCOUNTER — Encounter: Payer: Self-pay | Admitting: Family Medicine

## 2020-11-06 VITALS — Wt 168.0 lb

## 2020-11-06 DIAGNOSIS — E039 Hypothyroidism, unspecified: Secondary | ICD-10-CM

## 2020-11-06 DIAGNOSIS — R5383 Other fatigue: Secondary | ICD-10-CM

## 2020-11-06 NOTE — Telephone Encounter (Signed)
Pt will do virtual at 4.30

## 2020-11-06 NOTE — Telephone Encounter (Signed)
Pt wants to know if you can do lab work on me for tsh and hormone level I waking up at 3am , and no low energy though the day. Please advise if virtual is needed. She is off tomorrow and can come in for labs

## 2020-11-06 NOTE — Progress Notes (Signed)
   Subjective:  Documentation for virtual telephone encounter.  The patient was located at work. The provider was located in the office. The patient did consent to this visit and is aware of possible charges through their insurance for this visit.  The other persons participating in this telemedicine service were none. Time spent on call was 6 minutes and in review of previous records 8 minutes total.  This virtual service is not related to other E/M service within previous 7 days.     Patient ID: MISAO FACKRELL, female    DOB: 07/01/1976, 45 y.o.   MRN: [id number]  HPI Chief Complaint  Patient presents with  . no energy    No energy, waking up at 3am every morning and not being able to go back to sleep, period lasting 3 weeks   Complains of a 2 month history of fatigue.   Waking up around 3 am every morning and cannot go back sleep. Occasionally she has issues falling asleep.   States she had labs in January at OB/GYN, Dr. Renaldo Fiddler at [facility].   She also sees Dr. Talmage Nap, endocrinologist and recently her thyroid medication was changed.   She is wanting more in depth labs done to look for reasons for fatigue.       Review of Systems Pertinent positives and negatives in the history of present illness.     Objective:   Physical Exam Wt 168 lb (76.2 kg)   LMP 10/21/2020 Comment: lasted 3 weeks  BMI 27.12 kg/m         Assessment & Plan:  Fatigue, unspecified type  Discussed that there are multiple etiologies for fatigue. No red flag symptoms.  Discussed that she should get her results and notes from her OB/GYN office to see which labs were ordered.  I also recommend follow up with her endocrinologist since thyroid medication was changed and no recent follow up.

## 2020-11-06 NOTE — Telephone Encounter (Signed)
Can she do a virtual this PM? Put her on at 4:30 if so and get her ready sooner if possible please.

## 2020-11-15 MED FILL — NP THYROID 30 MG TABLET: 30 | 30 days supply | Qty: 30 | Fill #6

## 2020-11-15 MED FILL — TOPIRAMATE 25 MG TAB: 25 | 30 days supply | Qty: 90 | Fill #1

## 2020-11-15 MED FILL — NP THYROID 120 MG TABLET: 120 | 30 days supply | Qty: 30 | Fill #5

## 2020-11-21 ENCOUNTER — Other Ambulatory Visit: Payer: PRIVATE HEALTH INSURANCE

## 2020-11-21 ENCOUNTER — Other Ambulatory Visit: Payer: Self-pay

## 2020-11-21 ENCOUNTER — Ambulatory Visit: Payer: No Typology Code available for payment source | Admitting: Plastic Surgery

## 2020-11-21 DIAGNOSIS — Z20822 Contact with and (suspected) exposure to covid-19: Secondary | ICD-10-CM

## 2020-11-21 DIAGNOSIS — Z1159 Encounter for screening for other viral diseases: Secondary | ICD-10-CM

## 2020-11-21 DIAGNOSIS — R109 Unspecified abdominal pain: Secondary | ICD-10-CM

## 2020-11-21 DIAGNOSIS — R197 Diarrhea, unspecified: Secondary | ICD-10-CM

## 2020-11-21 DIAGNOSIS — G44209 Tension-type headache, unspecified, not intractable: Secondary | ICD-10-CM

## 2020-11-21 LAB — POC COVID19 BINAXNOW: SARS Coronavirus 2 Ag: NEGATIVE

## 2020-11-22 LAB — NOVEL CORONAVIRUS, NAA: SARS-CoV-2, NAA: NOT DETECTED

## 2020-11-22 LAB — SARS-COV-2, NAA 2 DAY TAT

## 2020-12-10 ENCOUNTER — Other Ambulatory Visit (HOSPITAL_BASED_OUTPATIENT_CLINIC_OR_DEPARTMENT_OTHER): Payer: Self-pay

## 2020-12-10 MED ORDER — ZAFEMY 150-35 MCG/24HR TD PTWK
MEDICATED_PATCH | TRANSDERMAL | 4 refills | Status: DC
Start: 2020-12-10 — End: 2021-04-04
  Filled 2020-12-10 (×2): qty 3, 28d supply, fill #0

## 2020-12-11 ENCOUNTER — Other Ambulatory Visit (HOSPITAL_BASED_OUTPATIENT_CLINIC_OR_DEPARTMENT_OTHER): Payer: Self-pay

## 2020-12-25 ENCOUNTER — Other Ambulatory Visit: Payer: Self-pay | Admitting: Family Medicine

## 2020-12-25 ENCOUNTER — Other Ambulatory Visit: Payer: Self-pay

## 2020-12-25 ENCOUNTER — Other Ambulatory Visit (HOSPITAL_BASED_OUTPATIENT_CLINIC_OR_DEPARTMENT_OTHER): Payer: Self-pay

## 2020-12-25 DIAGNOSIS — G43909 Migraine, unspecified, not intractable, without status migrainosus: Secondary | ICD-10-CM

## 2020-12-25 MED ORDER — TOPIRAMATE 25 MG PO TABS
ORAL_TABLET | ORAL | 1 refills | Status: DC
  Filled 2020-12-25: qty 90, 30d supply, fill #0
  Filled 2021-02-25: qty 90, 30d supply, fill #1

## 2020-12-25 MED ORDER — NP THYROID 30 MG PO TABS
ORAL_TABLET | ORAL | 6 refills | Status: DC
Start: 2020-12-25 — End: 2021-04-04
  Filled 2020-12-25: qty 12, 30d supply, fill #0
  Filled 2021-02-06: qty 12, 30d supply, fill #1
  Filled 2021-03-12: qty 12, 30d supply, fill #2

## 2020-12-25 MED FILL — Thyroid Tab 120 MG (2 Grain): ORAL | 30 days supply | Qty: 30 | Fill #0 | Status: AC

## 2020-12-25 NOTE — Telephone Encounter (Signed)
I receive a high alert when trying to refill her Topamax regarding interaction or decreased effetiveness with her medications. Has she been taking all of her current medications together for a while or any new medications? It may be that her birth control is not as effective with Topamax on board. Please let her know

## 2020-12-25 NOTE — Telephone Encounter (Signed)
Left message for pt to call me back 

## 2020-12-27 ENCOUNTER — Encounter: Payer: Self-pay | Admitting: Medical

## 2020-12-27 ENCOUNTER — Other Ambulatory Visit: Payer: Self-pay

## 2020-12-27 ENCOUNTER — Telehealth (INDEPENDENT_AMBULATORY_CARE_PROVIDER_SITE_OTHER): Payer: No Typology Code available for payment source | Admitting: Medical

## 2020-12-27 VITALS — Ht 66.0 in | Wt 174.0 lb

## 2020-12-27 DIAGNOSIS — U071 COVID-19: Secondary | ICD-10-CM | POA: Diagnosis not present

## 2020-12-27 DIAGNOSIS — R112 Nausea with vomiting, unspecified: Secondary | ICD-10-CM

## 2020-12-27 DIAGNOSIS — R059 Cough, unspecified: Secondary | ICD-10-CM

## 2020-12-27 MED ORDER — EMERGEN-C IMMUNE PLUS PO PACK: 1.0000 | PACK | Freq: Two times a day (BID) | ORAL | 0 refills | Status: DC

## 2020-12-27 MED ORDER — PROMETHAZINE-DM 6.25-15 MG/5ML PO SYRP: 5.0000 mL | ORAL_SOLUTION | Freq: Four times a day (QID) | ORAL | 0 refills | Status: DC | PRN

## 2020-12-27 NOTE — Progress Notes (Signed)
done

## 2020-12-27 NOTE — Progress Notes (Signed)
Subjective:     Patient ID: Donna Mccarthy, female   DOB: 1976/03/24, 45 y.o.   MRN: [id number]  This visit type was conducted due to national recommendations for restrictions regarding the COVID-19 Pandemic (e.g. social distancing) in an effort to limit this patient's exposure and mitigate transmission in our community.  Due to their co-morbid illnesses, this patient is at least at moderate risk for complications without adequate follow up.  This format is felt to be most appropriate for this patient at this time.    Documentation for virtual audio and video telecommunications through Donna Mccarthy encounter:  The patient was located at home. The provider was located in the office. The patient did consent to this visit and is aware of possible charges through their insurance for this visit.  The other persons participating in this telemedicine service were none. Time spent on call was 20 minutes and in review of previous records 20 minutes total.  This virtual service is not related to other E/M service within previous 7 days.   HPI Chief Complaint  Patient presents with  . Covid Positive    Cough, fever, vomiting, headache, sore throat. Symptoms started Monday    Virtual consult for COVID illness.  She tested positive through health at work 2 days ago.  Her symptoms began 3 days ago on Monday.  Started Monday with scratchy throat, slight cough, initially but then tuesday body aches all over, coughing more, worse throat.  Vomited on Monday and last night.  She notes low-grade fever.  She had to change clothes 3 times last night due to all the sweats. Last night vomited 2 x.  No vomiting today.  She used some leftover Zofran last night.  No loss of smell or taste.   Using OTC alkal seltzer cold and sinus for symptoms as well as ibuprofen for fever and aches.  She is also using over-the-counter vitamin C.  She takes elderberry regularly.  She denies diarrhea.  No history of asthma.  No other  aggravating or relieving factors. No other complaint.   Past Medical History:  Diagnosis Date  . Allergy   . Anemia   . Anxiety   . Arthritis    arthritis knees  . Blood transfusion without reported diagnosis   . GERD (gastroesophageal reflux disease)   . Hypothyroidism   . Neuromuscular disorder (HCC)   . Obesity (BMI 30-39.9) 01/05/2019    Review of Systems As in subjective    Objective:   Physical Exam Due to coronavirus pandemic stay at home measures, patient visit was virtual and they were not examined in person.   Gen: Well-developed, well-nourished, somewhat ill-appearing Answers questions in complete sentences, no labored breathing, no shortness of breath or wheezing Ht 5\' 6"  (1.676 m)   Wt 174 lb (78.9 kg)   BMI 28.08 kg/m       Assessment:     Encounter Diagnoses  Name Primary?  . COVID-19 virus infection Yes  . Nausea and vomiting, intractability of vomiting not specified, unspecified vomiting type   . Cough        Plan:     We discussed symptoms, positive COVID test she had 3 days ago through health at work, diagnosis and recommendations.   General recommendations:  I recommend you rest, hydrate well with water and clear fluids throughout the day.    You can use Tylenol for pain or fever  You can use over the counter cough and congestion medicine you are using however  alternate this with the Promethazine DM that I called out if desired.  The Promethazine DM medicine is good for cough and nausea but it does not have a medicine for congestion like the stuff you are using over-the-counter.    You can use the Promethazine DM medicine for nausea and cough, you can also use your Zofran alternatively but space them out by at least 4 hours.  Begin the emergenC immune plus vitamin pack  If you are having trouble breathing, if you are very weak, have high fever 103 or higher consistently despite Tylenol, or uncontrollable nausea and vomiting, then call or  go to the emergency department.    If you have other questions or have other symptoms or questions recheck with me by phone or MyChart.  Covid symptoms such as fatigue and cough can linger over 2 weeks, even after the initial fever, aches, chills, and other initial symptoms.   Self Quarantine: The CDC, Centers for Disease Control has recommended a self quarantine of 5 days from the start of your illness until you are symptom-free including at least 24 hours of no symptoms including no fever, no shortness of breath, and no body aches and chills, by day 5 before returning to work or general contact with the public.  What does self quarantine mean: avoiding contact with people as much as possible.   Particularly in your house, isolate your self from others in a separate room, wear a mask when possible in the room, particularly if coughing a lot.   Have others bring food, water, medications, etc., to your door, but avoid direct contact with your household contacts during this time to avoid spreading the infection to them.   If you have a separate bathroom and living quarters during the next 2 weeks away from others, that would be preferable.    If you can't completely isolate, then wear a mask, wash hands frequently with soap and water for at least 15 seconds, minimize close contact with others, and have a friend or family member check regularly from a distance to make sure you are not getting seriously worse.     You should not be going out in public, should not be going to stores, to work or other public places until all your symptoms have resolved and at least 5 days + 24 hours of no symptoms at all have transpired.   Ideally you should avoid contact with others for a full 5 days if possible.  One of the goals is to limit spread to high risk people; people that are older and elderly, people with multiple health issues like diabetes, heart disease, lung disease, and anybody that has weakened immune systems  such as people with cancer or on immunosuppressive therapy.    [name] was seen today for covid positive.  Diagnoses and all orders for this visit:  COVID-19 virus infection  Nausea and vomiting, intractability of vomiting not specified, unspecified vomiting type  Cough  Other orders -     promethazine-dextromethorphan (PROMETHAZINE-DM) 6.25-15 MG/5ML syrup; Take 5 mLs by mouth 4 (four) times daily as needed for cough. -     Multiple Vitamins-Minerals (EMERGEN-C IMMUNE PLUS) PACK; Take 1 tablet by mouth 2 (two) times daily.    F/u prn

## 2020-12-27 NOTE — Patient Instructions (Signed)
We discussed symptoms, positive COVID test she had 3 days ago through health at work, diagnosis and recommendations.   General recommendations:  I recommend you rest, hydrate well with water and clear fluids throughout the day.    You can use Tylenol for pain or fever  You can use over the counter cough and congestion medicine you are using however alternate this with the Promethazine DM that I called out if desired.  The Promethazine DM medicine is good for cough and nausea but it does not have a medicine for congestion like the stuff you are using over-the-counter.    You can use the Promethazine DM medicine for nausea and cough, you can also use your Zofran alternatively but space them out by at least 4 hours.  Begin the emergenC immune plus vitamin pack  If you are having trouble breathing, if you are very weak, have high fever 103 or higher consistently despite Tylenol, or uncontrollable nausea and vomiting, then call or go to the emergency department.    If you have other questions or have other symptoms or questions recheck with me by phone or MyChart.  Covid symptoms such as fatigue and cough can linger over 2 weeks, even after the initial fever, aches, chills, and other initial symptoms.   Self Quarantine: The CDC, Centers for Disease Control has recommended a self quarantine of 5 days from the start of your illness until you are symptom-free including at least 24 hours of no symptoms including no fever, no shortness of breath, and no body aches and chills, by day 5 before returning to work or general contact with the public.  What does self quarantine mean: avoiding contact with people as much as possible.   Particularly in your house, isolate your self from others in a separate room, wear a mask when possible in the room, particularly if coughing a lot.   Have others bring food, water, medications, etc., to your door, but avoid direct contact with your household contacts during  this time to avoid spreading the infection to them.   If you have a separate bathroom and living quarters during the next 2 weeks away from others, that would be preferable.    If you can't completely isolate, then wear a mask, wash hands frequently with soap and water for at least 15 seconds, minimize close contact with others, and have a friend or family member check regularly from a distance to make sure you are not getting seriously worse.     You should not be going out in public, should not be going to stores, to work or other public places until all your symptoms have resolved and at least 5 days + 24 hours of no symptoms at all have transpired.   Ideally you should avoid contact with others for a full 5 days if possible.  One of the goals is to limit spread to high risk people; people that are older and elderly, people with multiple health issues like diabetes, heart disease, lung disease, and anybody that has weakened immune systems such as people with cancer or on immunosuppressive therapy.

## 2020-12-28 ENCOUNTER — Other Ambulatory Visit (HOSPITAL_BASED_OUTPATIENT_CLINIC_OR_DEPARTMENT_OTHER): Payer: Self-pay

## 2020-12-31 ENCOUNTER — Other Ambulatory Visit (HOSPITAL_COMMUNITY): Payer: Self-pay

## 2020-12-31 ENCOUNTER — Other Ambulatory Visit: Payer: Self-pay | Admitting: Medical

## 2020-12-31 ENCOUNTER — Other Ambulatory Visit (HOSPITAL_BASED_OUTPATIENT_CLINIC_OR_DEPARTMENT_OTHER): Payer: Self-pay

## 2020-12-31 MED ORDER — CLARITHROMYCIN 500 MG PO TABS
500.0000 mg | ORAL_TABLET | Freq: Two times a day (BID) | ORAL | 0 refills | Status: DC
  Filled 2020-12-31 (×2): qty 20, 10d supply, fill #0

## 2021-02-06 ENCOUNTER — Other Ambulatory Visit (HOSPITAL_BASED_OUTPATIENT_CLINIC_OR_DEPARTMENT_OTHER): Payer: Self-pay

## 2021-02-07 ENCOUNTER — Other Ambulatory Visit (HOSPITAL_BASED_OUTPATIENT_CLINIC_OR_DEPARTMENT_OTHER): Payer: Self-pay

## 2021-02-07 MED ORDER — NP THYROID 120 MG PO TABS
ORAL_TABLET | ORAL | 6 refills | Status: DC
Start: 2021-02-07 — End: 2021-04-04
  Filled 2021-02-07: qty 30, 30d supply, fill #0
  Filled 2021-03-12: qty 30, 30d supply, fill #1

## 2021-02-08 ENCOUNTER — Other Ambulatory Visit (HOSPITAL_BASED_OUTPATIENT_CLINIC_OR_DEPARTMENT_OTHER): Payer: Self-pay

## 2021-02-25 ENCOUNTER — Other Ambulatory Visit (HOSPITAL_BASED_OUTPATIENT_CLINIC_OR_DEPARTMENT_OTHER): Payer: Self-pay

## 2021-02-26 ENCOUNTER — Other Ambulatory Visit (HOSPITAL_BASED_OUTPATIENT_CLINIC_OR_DEPARTMENT_OTHER): Payer: Self-pay

## 2021-02-26 ENCOUNTER — Ambulatory Visit: Payer: No Typology Code available for payment source | Admitting: Medical

## 2021-02-26 MED ORDER — NP THYROID 30 MG PO TABS
ORAL_TABLET | ORAL | 6 refills | Status: DC
Start: 2021-02-26 — End: 2021-04-04
  Filled 2021-02-26 – 2021-03-29 (×3): qty 30, 30d supply, fill #0

## 2021-02-26 MED ORDER — NP THYROID 120 MG PO TABS
ORAL_TABLET | ORAL | 6 refills | Status: DC
  Filled 2021-02-26 – 2021-03-04 (×2): qty 30, 30d supply, fill #0

## 2021-03-04 ENCOUNTER — Other Ambulatory Visit (HOSPITAL_BASED_OUTPATIENT_CLINIC_OR_DEPARTMENT_OTHER): Payer: Self-pay

## 2021-03-11 ENCOUNTER — Other Ambulatory Visit (HOSPITAL_BASED_OUTPATIENT_CLINIC_OR_DEPARTMENT_OTHER): Payer: Self-pay

## 2021-03-12 ENCOUNTER — Other Ambulatory Visit (HOSPITAL_BASED_OUTPATIENT_CLINIC_OR_DEPARTMENT_OTHER): Payer: Self-pay

## 2021-03-29 ENCOUNTER — Other Ambulatory Visit (HOSPITAL_BASED_OUTPATIENT_CLINIC_OR_DEPARTMENT_OTHER): Payer: Self-pay

## 2021-04-04 ENCOUNTER — Other Ambulatory Visit (HOSPITAL_BASED_OUTPATIENT_CLINIC_OR_DEPARTMENT_OTHER): Payer: Self-pay

## 2021-04-04 ENCOUNTER — Other Ambulatory Visit: Payer: Self-pay

## 2021-04-04 ENCOUNTER — Encounter: Payer: Self-pay | Admitting: Family Medicine

## 2021-04-04 ENCOUNTER — Ambulatory Visit: Payer: No Typology Code available for payment source | Admitting: Family Medicine

## 2021-04-04 ENCOUNTER — Ambulatory Visit (INDEPENDENT_AMBULATORY_CARE_PROVIDER_SITE_OTHER): Payer: No Typology Code available for payment source | Admitting: Family Medicine

## 2021-04-04 VITALS — BP 112/74 | HR 82 | Temp 99.0°F | Wt 175.8 lb

## 2021-04-04 DIAGNOSIS — R350 Frequency of micturition: Secondary | ICD-10-CM | POA: Diagnosis not present

## 2021-04-04 DIAGNOSIS — M545 Low back pain, unspecified: Secondary | ICD-10-CM

## 2021-04-04 DIAGNOSIS — N739 Female pelvic inflammatory disease, unspecified: Secondary | ICD-10-CM | POA: Diagnosis not present

## 2021-04-04 DIAGNOSIS — Z113 Encounter for screening for infections with a predominantly sexual mode of transmission: Secondary | ICD-10-CM

## 2021-04-04 DIAGNOSIS — R102 Pelvic and perineal pain: Secondary | ICD-10-CM

## 2021-04-04 LAB — POCT URINALYSIS DIP (CLINITEK)
Bilirubin, UA: NEGATIVE
Blood, UA: NEGATIVE
Glucose, UA: NEGATIVE mg/dL
Ketones, POC UA: NEGATIVE mg/dL
Leukocytes, UA: NEGATIVE
Nitrite, UA: NEGATIVE
POC PROTEIN,UA: NEGATIVE
Spec Grav, UA: 1.025 (ref 1.010–1.025)
Urobilinogen, UA: 0.2 E.U./dL
pH, UA: 6 (ref 5.0–8.0)

## 2021-04-04 LAB — POCT WET PREP (WET MOUNT)
Clue Cells Wet Prep Whiff POC: NEGATIVE
Trichomonas Wet Prep HPF POC: ABSENT

## 2021-04-04 MED ORDER — DOXYCYCLINE HYCLATE 100 MG PO TABS
100.0000 mg | ORAL_TABLET | Freq: Two times a day (BID) | ORAL | 0 refills | Status: DC
Start: 2021-04-04 — End: 2021-05-10
  Filled 2021-04-04: qty 28, 14d supply, fill #0

## 2021-04-04 MED ORDER — METRONIDAZOLE 500 MG PO TABS
500.0000 mg | ORAL_TABLET | Freq: Two times a day (BID) | ORAL | 0 refills | Status: AC
  Filled 2021-04-04: qty 28, 14d supply, fill #0

## 2021-04-04 MED ORDER — CEFTRIAXONE SODIUM 1 G IJ SOLR
1.0000 g | Freq: Once | INTRAMUSCULAR | Status: AC
  Administered 2021-04-04: 1 g via INTRAMUSCULAR

## 2021-04-04 NOTE — Progress Notes (Signed)
Chief Complaint  Patient presents with   other    Possible UTI abdominal pain and lower back pain, pressure and chills, stinging with urination, started Monday got worse yesterday and today worse. OTC cranberry pills, juice and increased water. Has been going to the bathroom more frequently   Monday she noted some pressure and urinary frequency. Yesterday she started with suprapubic pain and low back pain.  She is having urgency/frequency.  She has some abdominal pain after voiding. Urine was cloudy yesterday.  Once noted some dried blood on the toilet paper.  Feels similar to prior bladder infections. Denies vaginal discharge  Unprotected sex with partner of 6 months.  She has had some abnormal vaginal bleeding. Has Mirena IUD. No post-coital bleeding. No painful intercourse. No h/o STD's  PMH, PSH, SH reviewed  Outpatient Encounter Medications as of 04/04/2021  Medication Sig Note   Ascorbic Acid (VITAMIN C) 100 MG tablet Take 100 mg by mouth daily.     Biotin 1000 MCG tablet Take 1,000 mcg by mouth daily.    Cholecalciferol 125 MCG (5000 UT) capsule TAKE 1 TABLET BY MOUTH DAILY.    ELDERBERRY PO Take by mouth.    levonorgestrel (MIRENA) 20 MCG/24HR IUD 1 each by Intrauterine route once.    melatonin 3 MG TABS tablet Take 2 tablet by mouth at bedtime.    Multiple Vitamins-Minerals (EMERGEN-C IMMUNE PLUS) PACK Take 1 tablet by mouth 2 (two) times daily.    NP THYROID 30 MG tablet Take 30 mg by mouth daily before breakfast.  09/10/2020: 120mg  Saturday and Sunday and 150mg  mon-fri   thyroid (ARMOUR) 120 MG tablet Take 120 mg by mouth daily before breakfast. 03/30/2020: Pt takes a 120 mg and 30 mg for a daily total of 150 mg daily   thyroid (ARMOUR) 30 MG tablet TAKE 1 TABLET BY MOUTH DAILY ON AND EMPTY STOMACH    topiramate (TOPAMAX) 25 MG tablet TAKE 3 TABLETS (75 MG TOTAL) BY MOUTH AT BEDTIME.    vitamin B-12 (CYANOCOBALAMIN) 50 MCG tablet Take 50 mcg by mouth daily.    [DISCONTINUED]  Cholecalciferol (VITAMIN D-3) 125 MCG (5000 UT) TABS Take 1 tablet by mouth daily.    fluticasone (FLONASE) 50 MCG/ACT nasal spray Place 2 sprays into both nostrils daily. (Patient not taking: No sig reported)    promethazine-dextromethorphan (PROMETHAZINE-DM) 6.25-15 MG/5ML syrup Take 5 mLs by mouth 4 (four) times daily as needed for cough. (Patient not taking: Reported on 04/04/2021)    thyroid (ARMOUR) 120 MG tablet TAKE 1 TABLET BY MOUTH ONCE DAILY ON AN EMPTY STOMACH    [DISCONTINUED] clarithromycin (BIAXIN) 500 MG tablet Take 1 tablet (500 mg total) by mouth 2 (two) times daily. (Patient not taking: Reported on 04/04/2021)    [DISCONTINUED] norelgestromin-ethinyl estradiol (ZAFEMY) 150-35 MCG/24HR transdermal patch Place 1 patch onto the skin weekly. (Patient not taking: Reported on 04/04/2021)    [DISCONTINUED] thyroid (NP THYROID) 120 MG tablet TAKE 1 TABLET BY MOUTH ONCE DAILY ON AN EMPTY STOMACH (Patient not taking: Reported on 04/04/2021)    [DISCONTINUED] thyroid (NP THYROID) 120 MG tablet Take 1 tablet by mouth once daily on an empty stomach (Patient not taking: Reported on 04/04/2021)    [DISCONTINUED] thyroid (NP THYROID) 30 MG tablet TAKE 1 TABLET BY MOUTH ON AN EMPTY STOMACH ONCE A DAY Orally mon-WED 30 days (Patient not taking: Reported on 04/04/2021)    [DISCONTINUED] thyroid (NP THYROID) 30 MG tablet Take 1 tablet by mouth once daily on an empty  stomach (Patient not taking: Reported on 04/04/2021)    No facility-administered encounter medications on file as of 04/04/2021.   Allergies  Allergen Reactions   Penicillins Other (See Comments)    Mother said she had a rash   Toradol [Ketorolac Tromethamine] Other (See Comments)    "felt like whole body was on fire"   Celebrex [Celecoxib]     itching   ROS: +f/c/abdominal pain back pain, urinary complaints per HPI. No URI symptoms. No cough, shortness of breath or chest pain. No n/v/d No flank pain. Dull headache   PHYSICAL  EXAM:  BP 112/74   Pulse 82   Temp 99 F (37.2 C)   Wt 175 lb 12.8 oz (79.7 kg)   LMP 03/22/2021   BMI 28.37 kg/m   Pleasant female, no acute distress. In mod discomfort during pelvic/abdominal exam. HEENT: conjunctiva and sclera are clear, EOMI, wearing mask. Sinuses nontender Neck: no lymphadenopathy or mass Heart: regular rate and rhythm Lungs: clear bilaterally Abdomen: Mild epigastric tenderness. No organomegaly or mass. Tender across entire lower abdomen (suprapubic, RLQ and LLQ). No rebound or guarding Pelvic--+CMT, and R>L adnexal tenderness.  Cervix appears normal.  Fairly long IUD strings noted. No abnormal vaginal discharge (small amount of thin white discharge).  Hard stool palpable posteriorly (made speculum insertion difficult at first). Hard stool was confirmed in rectal vault on rectal exam Back: Tender at lower lumbar spine and R lower paraspinous muscles, slightly tight. No CVA tenderness.   Urine dip: SG 1.025, negative nitrite, leuks  Negative KOH wet prep  ASSESSMENT/PLAN:  Pelvic inflammatory disease - based on CMT, fever. Negative wet prep, no abnl d/c. Discussed IUD (affecting infection risks, and effect on pain). Treat for PID, f/u with GYN next week - Plan: GC/Chlamydia Probe Amp, cefTRIAXone (ROCEPHIN) injection 1 g, doxycycline (VIBRA-TABS) 100 MG tablet, metroNIDAZOLE (FLAGYL) 500 MG tablet  Frequent urination - normal urine dip - Plan: POCT URINALYSIS DIP (CLINITEK), Urine Culture  Pelvic pain - Ddx reviewed--no e/o UTI based on u/a. Poss related to IUD (migration? strings visible, but long) vs PID. Cover for PID given fever and exam - Plan: CBC with Differential/Platelet, POCT Wet Prep [facility])  Screen for STD (sexually transmitted disease) - Plan: HIV Antibody (routine testing w rflx), RPR  Acute low back pain without sciatica, unspecified back pain laterality - tender over lower lumbar spine, and R lower paraspinous muscles. no CVA tenderness.   Heat, NSAID/tylenol, stretches/massage  Discussed recommended treatment for PID. If very high WBC, persistent/worsening fever/symptoms, may need ER/IV treatment. Giving 2 wk course of ABX, but needs to f/u with her GYN next week. Discussed IUD (may need to be removed), unclear if IUD contributing to her pain (vs STD).   GYN Zelphia [place] Send results F/u within 1 week

## 2021-04-04 NOTE — Patient Instructions (Signed)
Your urine didn't show any infection. Your exam suggests pelvic inflammatory disease. You should follow up with your gynecologist due to the presence of the IUD.  Keep an eye on your Mychart for test results. Please let us know if you're getting worse (higher fevers, vomiting, worsening pain), as you may need to go to the ER and be imaged and possibly hospitalized.  Pelvic Inflammatory Disease  Pelvic inflammatory disease (PID) is caused by an infection in some or all of the female reproductive organs. The infection can be in the uterus, ovaries, fallopian tubes, or the surrounding tissues in the pelvis. PID can cause abdominal or pelvic pain that comes on suddenly (acute pelvic pain). PID is a serious infection because it can lead to lasting (chronic) pelvic pain or the inability to have children (infertility). What are the causes? This condition is most often caused by bacteria that is spread during sexual contact. It can also be caused by a bacterial infection of the vagina (bacterial vaginosis) that is not spread by sexual contact. This condition occurs when the infection is not treated and the bacteria travel upward from the vagina or cervix into the reproductive organs. Bacteria may also be introduced into the reproductive organs following: The birth of a baby. A miscarriage. An abortion. Major pelvic surgery. The insertion of an intrauterine device (IUD). A sexual assault. What increases the risk? You are more likely to develop this condition if you: Are younger than 43 years of age. Are sexually active at a young age. Have a history of STI (sexually transmitted infection) or PID. Do not regularly use barrier contraception methods, such as condoms. Have multiple sexual partners. Have sex with someone who has symptoms of an STI. Use a vaginal douche. Have recently had an IUD inserted. What are the signs or symptoms? Symptoms of this condition include: Abdominal or pelvic  pain. Fever. Chills. Abnormal vaginal discharge. Abnormal uterine bleeding. Unusual pain shortly after the end of a menstrual period. Painful urination. Pain with sex. Nausea and vomiting. How is this diagnosed? This condition is diagnosed based on a pelvic exam and medical history. A pelvic exam can reveal signs of infection, inflammation, and discharge in the vagina and the surrounding tissues. It can also help to identify painful areas. You may also have tests, such as: Lab tests, including a pregnancy test, blood tests, and a urine test. Culture tests of the vagina and cervix to check for an STI. Ultrasound. A laparoscopic procedure to look inside the pelvis. Examination of vaginal discharge under a microscope. How is this treated? This condition may be treated with: Antibiotic medicines taken by mouth (orally). For more severe cases, antibiotics may be given through an IV at the hospital. Surgery. This is rare. Surgery may be needed if other treatments do not help. Efforts to stop the spread of the infection. Sexual partners may need to be treated if the infection is caused by an STI. It may take weeks until you are completely well. If you are diagnosed with PID, you should also be checked for HIV (human immunodeficiency virus). Your health care provider may test you for infection again 3 months aftertreatment. You should not have unprotected sex. Follow these instructions at home: Take over-the-counter and prescription medicines only as told by your health care provider. If you were prescribed an antibiotic medicine, take it as told by your health care provider. Do not stop using the antibiotic even if you start to feel better. Do not have sex until treatment  is completed or as told by your health care provider. If PID is confirmed, your recent sexual partners will need treatment, especially if you had unprotected sex. Keep all follow-up visits as told by your health care provider.  This is important. Contact a health care provider if: You have increased or abnormal vaginal discharge. Your pain does not improve. You vomit. You have a fever. You cannot tolerate your medicines. Your partner has an STI. You have pain when you urinate. Get help right away if: You have increased abdominal or pelvic pain. You have chills. Your symptoms are not better in 72 hours with treatment. Summary Pelvic inflammatory disease (PID) is caused by an infection in some or all of the female reproductive organs. PID is a serious infection because it can lead to lasting (chronic) pelvic pain or the inability to have children (infertility). This infection is usually treated with antibiotic medicines. Do not have sex until treatment is completed or as told by your health care provider. This information is not intended to replace advice given to you by your health care provider. Make sure you discuss any questions you have with your healthcare provider. Document Revised: 04/22/2018 Document Reviewed: 04/27/2018 Elsevier Patient Education  2022 [employer].

## 2021-04-05 LAB — CBC WITH DIFFERENTIAL/PLATELET
Basophils Absolute: 0 10*3/uL (ref 0.0–0.2)
Basos: 0 %
EOS (ABSOLUTE): 0 10*3/uL (ref 0.0–0.4)
Eos: 1 %
Hematocrit: 36.8 % (ref 34.0–46.6)
Hemoglobin: 11.7 g/dL (ref 11.1–15.9)
Immature Grans (Abs): 0 10*3/uL (ref 0.0–0.1)
Immature Granulocytes: 0 %
Lymphocytes Absolute: 1.9 10*3/uL (ref 0.7–3.1)
Lymphs: 42 %
MCH: 26.8 pg (ref 26.6–33.0)
MCHC: 31.8 g/dL (ref 31.5–35.7)
MCV: 84 fL (ref 79–97)
Monocytes Absolute: 0.4 10*3/uL (ref 0.1–0.9)
Monocytes: 9 %
Neutrophils Absolute: 2.2 10*3/uL (ref 1.4–7.0)
Neutrophils: 48 %
Platelets: 210 10*3/uL (ref 150–450)
RBC: 4.36 x10E6/uL (ref 3.77–5.28)
RDW: 12.6 % (ref 11.7–15.4)
WBC: 4.6 10*3/uL (ref 3.4–10.8)

## 2021-04-05 LAB — HIV ANTIBODY (ROUTINE TESTING W REFLEX): HIV Screen 4th Generation wRfx: NONREACTIVE

## 2021-04-05 LAB — RPR: RPR Ser Ql: NONREACTIVE

## 2021-04-06 LAB — URINE CULTURE: Organism ID, Bacteria: NO GROWTH

## 2021-04-08 LAB — GC/CHLAMYDIA PROBE AMP
Chlamydia trachomatis, NAA: NEGATIVE
Neisseria Gonorrhoeae by PCR: NEGATIVE

## 2021-04-15 ENCOUNTER — Other Ambulatory Visit (HOSPITAL_BASED_OUTPATIENT_CLINIC_OR_DEPARTMENT_OTHER): Payer: Self-pay

## 2021-04-15 ENCOUNTER — Other Ambulatory Visit: Payer: Self-pay | Admitting: Family Medicine

## 2021-04-15 DIAGNOSIS — G43909 Migraine, unspecified, not intractable, without status migrainosus: Secondary | ICD-10-CM

## 2021-04-15 MED ORDER — TOPIRAMATE 25 MG PO TABS
ORAL_TABLET | ORAL | 0 refills | Status: DC
  Filled 2021-04-15: qty 270, 90d supply, fill #0

## 2021-04-26 ENCOUNTER — Other Ambulatory Visit (HOSPITAL_BASED_OUTPATIENT_CLINIC_OR_DEPARTMENT_OTHER): Payer: Self-pay

## 2021-04-27 ENCOUNTER — Other Ambulatory Visit (HOSPITAL_BASED_OUTPATIENT_CLINIC_OR_DEPARTMENT_OTHER): Payer: Self-pay

## 2021-04-27 MED ORDER — NP THYROID 120 MG PO TABS
ORAL_TABLET | ORAL | 6 refills | Status: DC
  Filled 2021-04-27: qty 30, 30d supply, fill #0
  Filled 2021-06-17: qty 30, 30d supply, fill #1

## 2021-04-29 ENCOUNTER — Other Ambulatory Visit (HOSPITAL_BASED_OUTPATIENT_CLINIC_OR_DEPARTMENT_OTHER): Payer: Self-pay

## 2021-04-30 ENCOUNTER — Other Ambulatory Visit (HOSPITAL_BASED_OUTPATIENT_CLINIC_OR_DEPARTMENT_OTHER): Payer: Self-pay

## 2021-05-01 ENCOUNTER — Other Ambulatory Visit (HOSPITAL_BASED_OUTPATIENT_CLINIC_OR_DEPARTMENT_OTHER): Payer: Self-pay

## 2021-05-02 ENCOUNTER — Other Ambulatory Visit (HOSPITAL_BASED_OUTPATIENT_CLINIC_OR_DEPARTMENT_OTHER): Payer: Self-pay

## 2021-05-02 MED ORDER — THYROID 30 MG PO TABS
30.0000 mg | ORAL_TABLET | Freq: Every day | ORAL | 3 refills | Status: DC
  Filled 2021-05-02: qty 90, 90d supply, fill #0
  Filled 2021-07-08: qty 90, 90d supply, fill #1
  Filled 2021-07-08: qty 7, 7d supply, fill #1
  Filled 2021-07-17: qty 83, 83d supply, fill #2

## 2021-05-08 ENCOUNTER — Other Ambulatory Visit (HOSPITAL_BASED_OUTPATIENT_CLINIC_OR_DEPARTMENT_OTHER): Payer: Self-pay

## 2021-05-10 ENCOUNTER — Other Ambulatory Visit: Payer: Self-pay

## 2021-05-10 ENCOUNTER — Ambulatory Visit (INDEPENDENT_AMBULATORY_CARE_PROVIDER_SITE_OTHER): Payer: No Typology Code available for payment source | Admitting: Family Medicine

## 2021-05-10 ENCOUNTER — Encounter: Payer: Self-pay | Admitting: Family Medicine

## 2021-05-10 VITALS — BP 102/64 | HR 72 | Ht 65.5 in | Wt 180.0 lb

## 2021-05-10 DIAGNOSIS — E039 Hypothyroidism, unspecified: Secondary | ICD-10-CM

## 2021-05-10 DIAGNOSIS — Z0000 Encounter for general adult medical examination without abnormal findings: Secondary | ICD-10-CM

## 2021-05-10 DIAGNOSIS — E559 Vitamin D deficiency, unspecified: Secondary | ICD-10-CM

## 2021-05-10 NOTE — Patient Instructions (Signed)
Preventive Care 109-43 Years Old, Female Preventive care refers to lifestyle choices and visits with your health care provider that can promote health and wellness. This includes: A yearly physical exam. This is also called an annual wellness visit. Regular dental and eye exams. Immunizations. Screening for certain conditions. Healthy lifestyle choices, such as: Eating a healthy diet. Getting regular exercise. Not using drugs or products that contain nicotine and tobacco. Limiting alcohol use. What can I expect for my preventive care visit? Physical exam Your health care provider will check your: Height and weight. These may be used to calculate your BMI (body mass index). BMI is a measurement that tells if you are at a healthy weight. Heart rate and blood pressure. Body temperature. Skin for abnormal spots. Counseling Your health care provider may ask you questions about your: Past medical problems. Family's medical history. Alcohol, tobacco, and drug use. Emotional well-being. Home life and relationship well-being. Sexual activity. Diet, exercise, and sleep habits. Work and work Statistician. Access to firearms. Method of birth control. Menstrual cycle. Pregnancy history. What immunizations do I need? Vaccines are usually given at various ages, according to a schedule. Your health care provider will recommend vaccines for you based on your age, medical history, and lifestyle or other factors, such as travel or where you work. What tests do I need? Blood tests Lipid and cholesterol levels. These may be checked every 5 years, or more often if you are over 61 years old. Hepatitis C test. Hepatitis B test. Screening Lung cancer screening. You may have this screening every year starting at age 67 if you have a 30-pack-year history of smoking and currently smoke or have quit within the past 15 years. Colorectal cancer screening. All adults should have this screening starting at  age 104 and continuing until age 29. Your health care provider may recommend screening at age 59 if you are at increased risk. You will have tests every 1-10 years, depending on your results and the type of screening test. Diabetes screening. This is done by checking your blood sugar (glucose) after you have not eaten for a while (fasting). You may have this done every 1-3 years. Mammogram. This may be done every 1-2 years. Talk with your health care provider about when you should start having regular mammograms. This may depend on whether you have a family history of breast cancer. BRCA-related cancer screening. This may be done if you have a family history of breast, ovarian, tubal, or peritoneal cancers. Pelvic exam and Pap test. This may be done every 3 years starting at age 73. Starting at age 6, this may be done every 5 years if you have a Pap test in combination with an HPV test. Other tests STD (sexually transmitted disease) testing, if you are at risk. Bone density scan. This is done to screen for osteoporosis. You may have this scan if you are at high risk for osteoporosis. Talk with your health care provider about your test results, treatment options, and if necessary, the need for more tests. Follow these instructions at home: Eating and drinking  Eat a diet that includes fresh fruits and vegetables, whole grains, lean protein, and low-fat dairy products. Take vitamin and mineral supplements as recommended by your health care provider. Do not drink alcohol if: Your health care provider tells you not to drink. You are pregnant, may be pregnant, or are planning to become pregnant. If you drink alcohol: Limit how much you have to 0-1 drink a day. Be  aware of how much alcohol is in your drink. In the [place] one drink equals one 12 oz bottle of beer (355 mL), one 5 oz glass of wine (148 mL), or one 1 oz glass of hard liquor (44 mL). Lifestyle Take daily care of your teeth and  gums. Brush your teeth every morning and night with fluoride toothpaste. Floss one time each day. Stay active. Exercise for at least 30 minutes 5 or more days each week. Do not use any products that contain nicotine or tobacco, such as cigarettes, e-cigarettes, and chewing tobacco. If you need help quitting, ask your health care provider. Do not use drugs. If you are sexually active, practice safe sex. Use a condom or other form of protection to prevent STIs (sexually transmitted infections). If you do not wish to become pregnant, use a form of birth control. If you plan to become pregnant, see your health care provider for a prepregnancy visit. If told by your health care provider, take low-dose aspirin daily starting at age 65. Find healthy ways to cope with stress, such as: Meditation, yoga, or listening to music. Journaling. Talking to a trusted person. Spending time with friends and family. Safety Always wear your seat belt while driving or riding in a vehicle. Do not drive: If you have been drinking alcohol. Do not ride with someone who has been drinking. When you are tired or distracted. While texting. Wear a helmet and other protective equipment during sports activities. If you have firearms in your house, make sure you follow all gun safety procedures. What's next? Visit your health care provider once a year for an annual wellness visit. Ask your health care provider how often you should have your eyes and teeth checked. Stay up to date on all vaccines. This information is not intended to replace advice given to you by your health care provider. Make sure you discuss any questions you have with your health care provider. Document Revised: 10/12/2020 Document Reviewed: 04/15/2018 Elsevier Patient Education  2022 [employer].

## 2021-05-10 NOTE — Progress Notes (Signed)
Subjective:    Patient ID: Donna Mccarthy, female    DOB: Aug 07, 1976, 45 y.o.   MRN: [id number]  HPI Chief Complaint  Patient presents with   Annual Exam   She is here for a complete physical exam.  Other providers: Endocrinologist- Dr. Talmage Nap  OB/GYN - Dr. Su Hilt  Dr. Talmage Nap is managing her thyroid function  Mood is stable. Enjoys her job currently   Social history: her son is doing well, works for [employer]  Denies smoking, drinking alcohol, drug use  Diet: fairly healthy  Excerise: not as much as she would like  Immunizations: [employer] employee   Health maintenance:   Mammogram: at OB/GYN Colonoscopy:N/A Last Gynecological Exam: upcoming appt Last Menstrual cycle: currently Last Dental Exam: UTD  Last Eye Exam: UTD  Wears seatbelt always, smoke detectors in home and functioning, does not text while driving and feels safe in home environment.   Reviewed allergies, medications, past medical, surgical, family, and social history.    Review of Systems Review of Systems Constitutional: -fever, -chills, -sweats, -unexpected weight change,-fatigue ENT: -runny nose, -ear pain, -sore throat Cardiology:  -chest pain, -palpitations, -edema Respiratory: -cough, -shortness of breath, -wheezing Gastroenterology: -abdominal pain, -nausea, -vomiting, -diarrhea, -constipation  Hematology: -bleeding or bruising problems Musculoskeletal: -arthralgias, -myalgias, -joint swelling, -back pain Ophthalmology: -vision changes Urology: -dysuria, -difficulty urinating, -hematuria, -urinary frequency, -urgency Neurology: -headache, -weakness, -tingling, -numbness       Objective:   Physical Exam BP 102/64   Pulse 72   Ht 5' 5.5" (1.664 m)   Wt 180 lb (81.6 kg)   LMP 05/09/2021   BMI 29.50 kg/m   General Appearance:    Alert, cooperative, no distress, appears stated age  Head:    Normocephalic, without obvious abnormality, atraumatic  Eyes:    PERRL, conjunctiva/corneas clear,  EOM's intact  Ears:    Normal TM's and external ear canals  Nose:   Mask on  Throat:   Mask on   Neck:   Supple, no lymphadenopathy;  thyroid:  no   enlargement/tenderness/nodules; no JVD  Back:    Spine nontender, no curvature, ROM normal, no CVA     tenderness  Lungs:     Clear to auscultation bilaterally without wheezes, rales or     ronchi; respirations unlabored  Chest Wall:    No tenderness or deformity   Heart:    Regular rate and rhythm, S1 and S2 normal, no murmur, rub   or gallop  Breast Exam:    OB/GYN   Abdomen:     Soft, non-tender, nondistended, normoactive bowel sounds,    no masses, no hepatosplenomegaly  Genitalia:    OB/GYN      Extremities:   No clubbing, cyanosis or edema  Pulses:   2+ and symmetric all extremities  Skin:   Skin color, texture, turgor normal, no rashes or lesions  Lymph nodes:   Cervical, supraclavicular, and axillary nodes normal  Neurologic:   CNII-XII intact, normal strength, sensation and gait           Psych:   Normal mood, affect, hygiene and grooming.        Assessment & Plan:  Routine general medical examination at a health care facility - Plan: CBC with Differential/Platelet, Comprehensive metabolic panel, TSH, T4, free -preventive health care reviewed. Sees OB/GYN. Recommend healthy diet and exercise as well as eye and dental exams. Immunizations with [facility].  Discussed safety   Vitamin D deficiency - Plan: VITAMIN D 25 Hydroxy (Vit-D  Deficiency, Fractures) -continue vitamin D. Follow up pending labs.   Hypothyroidism, unspecified type - Plan: TSH, T4, free -managed by Dr. Talmage Nap. Follow up

## 2021-05-11 LAB — COMPREHENSIVE METABOLIC PANEL WITH GFR
ALT: 10 IU/L (ref 0–32)
AST: 16 IU/L (ref 0–40)
Albumin/Globulin Ratio: 1.8 (ref 1.2–2.2)
Albumin: 4.5 g/dL (ref 3.8–4.8)
Alkaline Phosphatase: 69 IU/L (ref 44–121)
BUN/Creatinine Ratio: 17 (ref 9–23)
BUN: 10 mg/dL (ref 6–24)
Bilirubin Total: 0.5 mg/dL (ref 0.0–1.2)
CO2: 20 mmol/L (ref 20–29)
Calcium: 9.1 mg/dL (ref 8.7–10.2)
Chloride: 107 mmol/L — ABNORMAL HIGH (ref 96–106)
Creatinine, Ser: 0.6 mg/dL (ref 0.57–1.00)
Globulin, Total: 2.5 g/dL (ref 1.5–4.5)
Glucose: 102 mg/dL — ABNORMAL HIGH (ref 65–99)
Potassium: 4 mmol/L (ref 3.5–5.2)
Sodium: 139 mmol/L (ref 134–144)
Total Protein: 7 g/dL (ref 6.0–8.5)
eGFR: 114 mL/min/1.73

## 2021-05-11 LAB — CBC WITH DIFFERENTIAL/PLATELET
Basophils Absolute: 0 10*3/uL (ref 0.0–0.2)
Basos: 1 %
EOS (ABSOLUTE): 0.1 10*3/uL (ref 0.0–0.4)
Eos: 1 %
Hematocrit: 34.4 % (ref 34.0–46.6)
Hemoglobin: 11.3 g/dL (ref 11.1–15.9)
Immature Grans (Abs): 0 10*3/uL (ref 0.0–0.1)
Immature Granulocytes: 0 %
Lymphocytes Absolute: 2 10*3/uL (ref 0.7–3.1)
Lymphs: 45 %
MCH: 26.9 pg (ref 26.6–33.0)
MCHC: 32.8 g/dL (ref 31.5–35.7)
MCV: 82 fL (ref 79–97)
Monocytes Absolute: 0.3 10*3/uL (ref 0.1–0.9)
Monocytes: 7 %
Neutrophils Absolute: 2 10*3/uL (ref 1.4–7.0)
Neutrophils: 46 %
Platelets: 205 10*3/uL (ref 150–450)
RBC: 4.2 x10E6/uL (ref 3.77–5.28)
RDW: 12.7 % (ref 11.7–15.4)
WBC: 4.4 10*3/uL (ref 3.4–10.8)

## 2021-05-11 LAB — TSH: TSH: 4.95 u[IU]/mL — ABNORMAL HIGH (ref 0.450–4.500)

## 2021-05-11 LAB — VITAMIN D 25 HYDROXY (VIT D DEFICIENCY, FRACTURES): Vit D, 25-Hydroxy: 35.9 ng/mL (ref 30.0–100.0)

## 2021-05-11 LAB — T4, FREE: Free T4: 0.95 ng/dL (ref 0.82–1.77)

## 2021-05-13 ENCOUNTER — Encounter: Payer: No Typology Code available for payment source | Admitting: Family Medicine

## 2021-05-27 ENCOUNTER — Other Ambulatory Visit (HOSPITAL_BASED_OUTPATIENT_CLINIC_OR_DEPARTMENT_OTHER): Payer: Self-pay

## 2021-05-27 LAB — HM PAP SMEAR: HM Pap smear: ABNORMAL

## 2021-05-27 LAB — RESULTS CONSOLE HPV: CHL HPV: POSITIVE

## 2021-05-27 MED ORDER — NP THYROID 90 MG PO TABS
ORAL_TABLET | ORAL | 6 refills | Status: DC
  Filled 2021-05-27 – 2021-07-08 (×2): qty 60, 30d supply, fill #0
  Filled 2021-09-06: qty 60, 30d supply, fill #1

## 2021-06-05 ENCOUNTER — Other Ambulatory Visit (HOSPITAL_BASED_OUTPATIENT_CLINIC_OR_DEPARTMENT_OTHER): Payer: Self-pay

## 2021-06-17 ENCOUNTER — Other Ambulatory Visit (HOSPITAL_BASED_OUTPATIENT_CLINIC_OR_DEPARTMENT_OTHER): Payer: Self-pay

## 2021-06-25 ENCOUNTER — Other Ambulatory Visit: Payer: Self-pay | Admitting: Obstetrics and Gynecology

## 2021-07-08 ENCOUNTER — Other Ambulatory Visit (HOSPITAL_BASED_OUTPATIENT_CLINIC_OR_DEPARTMENT_OTHER): Payer: Self-pay

## 2021-07-17 ENCOUNTER — Other Ambulatory Visit (HOSPITAL_BASED_OUTPATIENT_CLINIC_OR_DEPARTMENT_OTHER): Payer: Self-pay

## 2021-08-13 ENCOUNTER — Other Ambulatory Visit: Payer: Self-pay | Admitting: Family Medicine

## 2021-08-13 DIAGNOSIS — G43909 Migraine, unspecified, not intractable, without status migrainosus: Secondary | ICD-10-CM

## 2021-08-14 ENCOUNTER — Other Ambulatory Visit (HOSPITAL_BASED_OUTPATIENT_CLINIC_OR_DEPARTMENT_OTHER): Payer: Self-pay

## 2021-08-14 MED ORDER — TOPIRAMATE 25 MG PO TABS
ORAL_TABLET | ORAL | 0 refills | Status: DC
  Filled 2021-08-14: qty 270, 90d supply, fill #0

## 2021-08-19 ENCOUNTER — Other Ambulatory Visit (HOSPITAL_BASED_OUTPATIENT_CLINIC_OR_DEPARTMENT_OTHER): Payer: Self-pay

## 2021-08-23 ENCOUNTER — Other Ambulatory Visit (HOSPITAL_BASED_OUTPATIENT_CLINIC_OR_DEPARTMENT_OTHER): Payer: Self-pay

## 2021-08-23 ENCOUNTER — Other Ambulatory Visit: Payer: Self-pay | Admitting: Obstetrics and Gynecology

## 2021-08-23 HISTORY — PX: LEEP: SHX91

## 2021-08-23 MED ORDER — OXYCODONE-ACETAMINOPHEN 5-325 MG PO TABS
ORAL_TABLET | ORAL | 0 refills | Status: DC
  Filled 2021-08-23: qty 10, 3d supply, fill #0

## 2021-08-23 MED ORDER — IBUPROFEN 600 MG PO TABS
ORAL_TABLET | ORAL | 0 refills | Status: DC
  Filled 2021-08-23: qty 30, 10d supply, fill #0

## 2021-08-23 MED ORDER — NORETHINDRONE ACETATE 5 MG PO TABS
ORAL_TABLET | ORAL | 1 refills | Status: DC
  Filled 2021-08-23: qty 30, 15d supply, fill #0
  Filled 2021-09-06: qty 30, 15d supply, fill #1

## 2021-09-06 ENCOUNTER — Other Ambulatory Visit (HOSPITAL_BASED_OUTPATIENT_CLINIC_OR_DEPARTMENT_OTHER): Payer: Self-pay

## 2021-09-06 ENCOUNTER — Telehealth: Payer: Self-pay

## 2021-09-06 NOTE — Telephone Encounter (Signed)
Received fax from [facility] with notes which mentioned PAP from 05/18/20 done at [facility]. Fax sent to them requesting PAP results.

## 2021-09-16 ENCOUNTER — Encounter: Payer: Self-pay | Admitting: *Deleted

## 2021-09-17 ENCOUNTER — Encounter: Payer: Self-pay | Admitting: Family Medicine

## 2021-09-23 ENCOUNTER — Other Ambulatory Visit (HOSPITAL_BASED_OUTPATIENT_CLINIC_OR_DEPARTMENT_OTHER): Payer: Self-pay

## 2021-09-23 MED ORDER — NORETHINDRONE ACETATE 5 MG PO TABS
ORAL_TABLET | ORAL | 1 refills | Status: DC
  Filled 2021-09-23: qty 30, 15d supply, fill #0
  Filled 2021-12-13: qty 30, 15d supply, fill #1

## 2021-09-30 ENCOUNTER — Other Ambulatory Visit (HOSPITAL_BASED_OUTPATIENT_CLINIC_OR_DEPARTMENT_OTHER): Payer: Self-pay

## 2021-09-30 ENCOUNTER — Other Ambulatory Visit: Payer: Self-pay

## 2021-09-30 MED ORDER — NP THYROID 120 MG PO TABS
ORAL_TABLET | ORAL | 6 refills | Status: DC
  Filled 2021-09-30: qty 60, 30d supply, fill #0

## 2021-10-08 ENCOUNTER — Other Ambulatory Visit (HOSPITAL_BASED_OUTPATIENT_CLINIC_OR_DEPARTMENT_OTHER): Payer: Self-pay

## 2021-10-16 ENCOUNTER — Other Ambulatory Visit (HOSPITAL_BASED_OUTPATIENT_CLINIC_OR_DEPARTMENT_OTHER): Payer: Self-pay

## 2021-10-16 ENCOUNTER — Telehealth: Payer: No Typology Code available for payment source | Admitting: Physician Assistant

## 2021-10-16 DIAGNOSIS — B9789 Other viral agents as the cause of diseases classified elsewhere: Secondary | ICD-10-CM

## 2021-10-16 DIAGNOSIS — J019 Acute sinusitis, unspecified: Secondary | ICD-10-CM | POA: Diagnosis not present

## 2021-10-16 MED ORDER — FLUTICASONE PROPIONATE 50 MCG/ACT NA SUSP
2.0000 | Freq: Every day | NASAL | 0 refills | Status: DC
Start: 2021-10-16 — End: 2021-12-25
  Filled 2021-10-16: qty 16, 30d supply, fill #0

## 2021-10-16 NOTE — Progress Notes (Signed)
I have spent 5 minutes in review of e-visit questionnaire, review and updating patient chart, medical decision making and response to patient.   Rual Vermeer Cody Oline Belk, PA-C    

## 2021-10-16 NOTE — Progress Notes (Signed)

## 2021-11-05 ENCOUNTER — Other Ambulatory Visit (HOSPITAL_BASED_OUTPATIENT_CLINIC_OR_DEPARTMENT_OTHER): Payer: Self-pay

## 2021-11-05 MED ORDER — NP THYROID 120 MG PO TABS
ORAL_TABLET | ORAL | 6 refills | Status: DC
  Filled 2021-11-05: qty 44, 30d supply, fill #0
  Filled 2021-12-13: qty 44, 30d supply, fill #1

## 2021-12-06 ENCOUNTER — Other Ambulatory Visit (HOSPITAL_BASED_OUTPATIENT_CLINIC_OR_DEPARTMENT_OTHER): Payer: Self-pay

## 2021-12-06 ENCOUNTER — Encounter: Payer: Self-pay | Admitting: Family Medicine

## 2021-12-06 DIAGNOSIS — G43909 Migraine, unspecified, not intractable, without status migrainosus: Secondary | ICD-10-CM

## 2021-12-06 MED ORDER — TOPIRAMATE 25 MG PO TABS
ORAL_TABLET | ORAL | 0 refills | Status: DC
  Filled 2021-12-06: qty 270, 90d supply, fill #0

## 2021-12-13 ENCOUNTER — Other Ambulatory Visit (HOSPITAL_BASED_OUTPATIENT_CLINIC_OR_DEPARTMENT_OTHER): Payer: Self-pay

## 2021-12-25 ENCOUNTER — Other Ambulatory Visit (HOSPITAL_BASED_OUTPATIENT_CLINIC_OR_DEPARTMENT_OTHER): Payer: Self-pay

## 2021-12-25 ENCOUNTER — Encounter: Payer: Self-pay | Admitting: Family Medicine

## 2021-12-25 ENCOUNTER — Ambulatory Visit (INDEPENDENT_AMBULATORY_CARE_PROVIDER_SITE_OTHER): Payer: No Typology Code available for payment source | Admitting: Family Medicine

## 2021-12-25 VITALS — BP 110/68 | HR 80 | Ht 65.5 in | Wt 191.4 lb

## 2021-12-25 DIAGNOSIS — F419 Anxiety disorder, unspecified: Secondary | ICD-10-CM

## 2021-12-25 DIAGNOSIS — F410 Panic disorder [episodic paroxysmal anxiety] without agoraphobia: Secondary | ICD-10-CM | POA: Diagnosis not present

## 2021-12-25 DIAGNOSIS — R232 Flushing: Secondary | ICD-10-CM

## 2021-12-25 DIAGNOSIS — E039 Hypothyroidism, unspecified: Secondary | ICD-10-CM

## 2021-12-25 DIAGNOSIS — R635 Abnormal weight gain: Secondary | ICD-10-CM

## 2021-12-25 MED ORDER — ALPRAZOLAM 0.25 MG PO TABS
0.2500 mg | ORAL_TABLET | Freq: Three times a day (TID) | ORAL | 0 refills | Status: DC | PRN
  Filled 2021-12-25: qty 10, 4d supply, fill #0
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
## 2021-12-25 NOTE — Patient Instructions (Signed)
Contact me with your thyroid results next week. ?Use alprazolam very sparingly if needed for severe anxiety or panic attacks. ?"Breathe the box" as needed for anxiety. ?We may need to start Lexapro if anxiety isn't improving (if not related to thyroid, and you have ongoing problems). ?Remember that can take up to 4 weeks to see the full effect, and you need to start it slowly to avoid increasing anxiety and side effects. ? ?Continue to try and get good sleep. If melatonin becomes less effective, we can consider other options for sleep (ie trazodone). ?Continue to get regular exercise. ? ?

## 2021-12-26 ENCOUNTER — Other Ambulatory Visit (HOSPITAL_BASED_OUTPATIENT_CLINIC_OR_DEPARTMENT_OTHER): Payer: Self-pay

## 2021-12-31 ENCOUNTER — Other Ambulatory Visit (INDEPENDENT_AMBULATORY_CARE_PROVIDER_SITE_OTHER): Payer: No Typology Code available for payment source

## 2021-12-31 DIAGNOSIS — E039 Hypothyroidism, unspecified: Secondary | ICD-10-CM

## 2022-01-01 ENCOUNTER — Encounter: Payer: Self-pay | Admitting: Family Medicine

## 2022-01-01 LAB — TSH: TSH: 0.072 u[IU]/mL — ABNORMAL LOW (ref 0.450–4.500)

## 2022-01-03 ENCOUNTER — Other Ambulatory Visit (HOSPITAL_BASED_OUTPATIENT_CLINIC_OR_DEPARTMENT_OTHER): Payer: Self-pay

## 2022-01-03 MED ORDER — NP THYROID 120 MG PO TABS
ORAL_TABLET | ORAL | 6 refills | Status: DC
  Filled 2022-01-03: qty 36, 28d supply, fill #0
  Filled 2022-01-20: qty 36, 30d supply, fill #0
  Filled 2022-02-12 – 2022-02-13 (×2): qty 36, 30d supply, fill #1
  Filled 2022-03-20: qty 36, 30d supply, fill #2
  Filled 2022-04-15: qty 36, 30d supply, fill #3
  Filled 2022-08-06: qty 36, 30d supply, fill #4

## 2022-01-09 IMAGING — CT CT ANGIO CHEST
2 of 6 series · 18 of 46 positions shown · IV contrast (omnipaque)
Comparison: Chest x-ray 10/24/2019

CLINICAL DATA: Shortness of breath

EXAM:
CT ANGIOGRAPHY CHEST WITH CONTRAST
TECHNIQUE: Multidetector CT imaging of the chest was performed using the
standard protocol during bolus administration of intravenous
contrast. Multiplanar CT image reconstructions and MIPs were
obtained to evaluate the vascular anatomy.
CONTRAST:  80mL OMNIPAQUE IOHEXOL 350 MG/ML SOLN

[Series 6: thins · axial · 0.74mm/px · z∈[+1321,+1557]mm · 15 of 260 slices shown]
[im 12/260  lung]
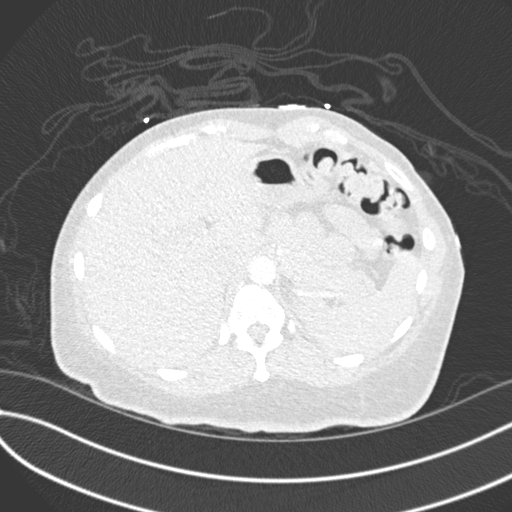
[im 34/260  soft-tissue]
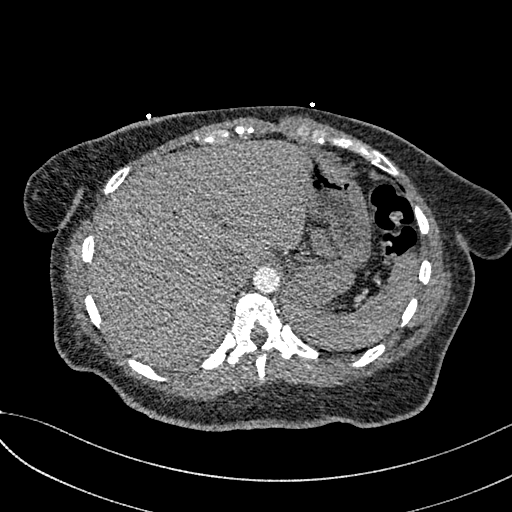
[im 46/260  lung]
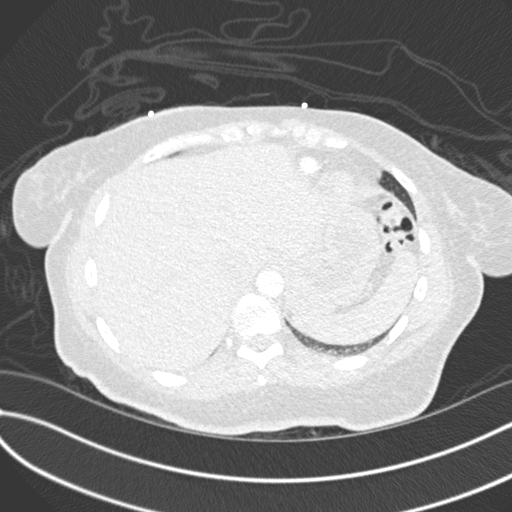
[im 68/260  soft-tissue]
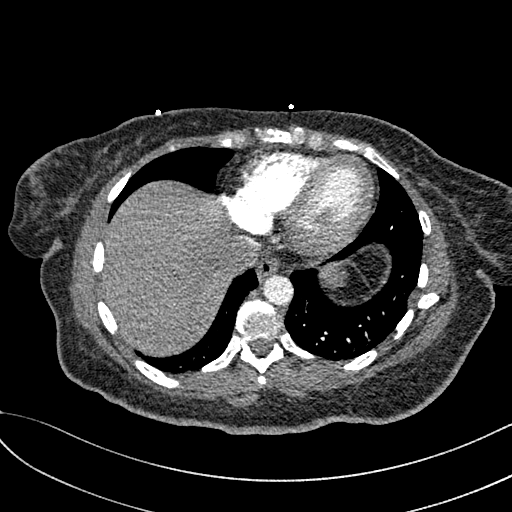
[im 79/260  lung]
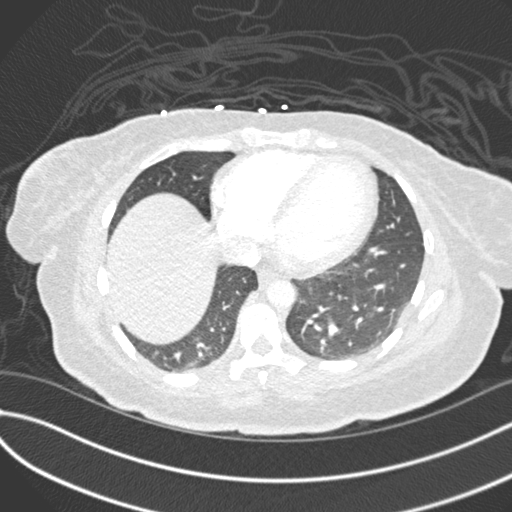
[im 102/260  soft-tissue]
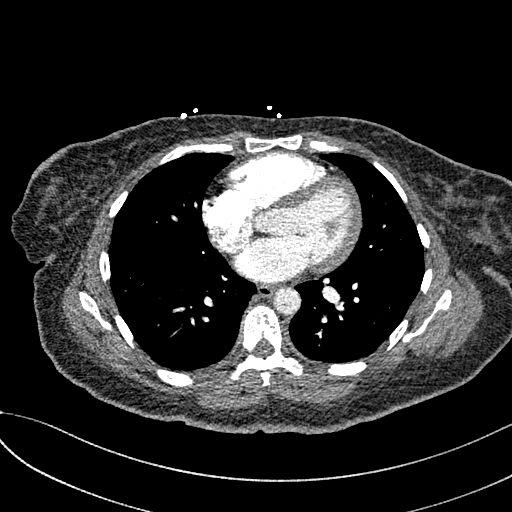
[im 113/260  lung]
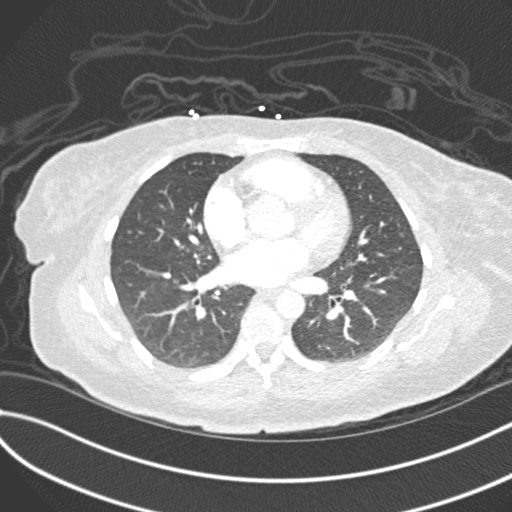
[im 136/260  soft-tissue]
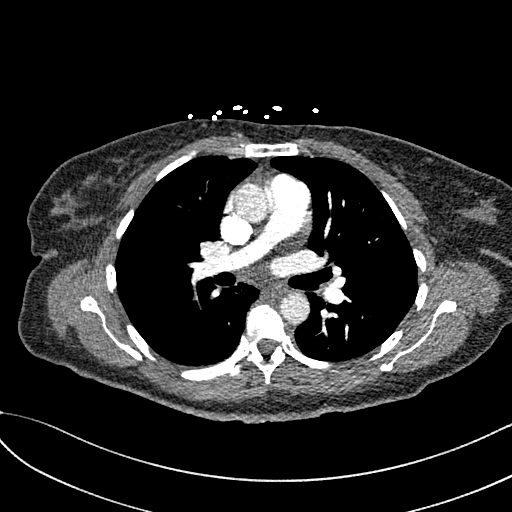
[im 147/260  lung]
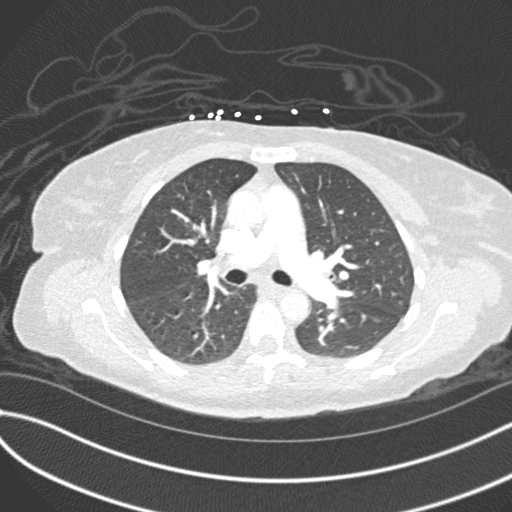
[im 158/260  soft-tissue]
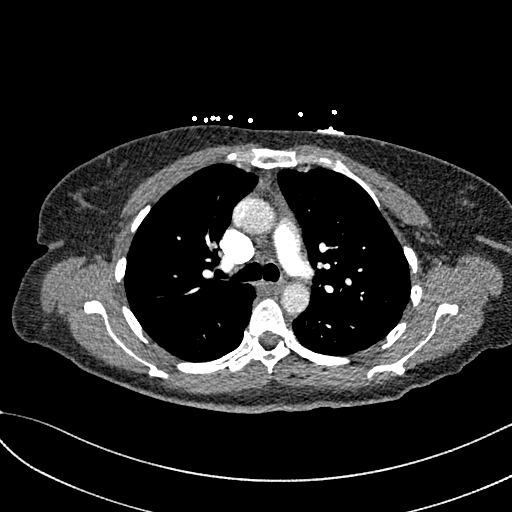
[im 181/260  lung]
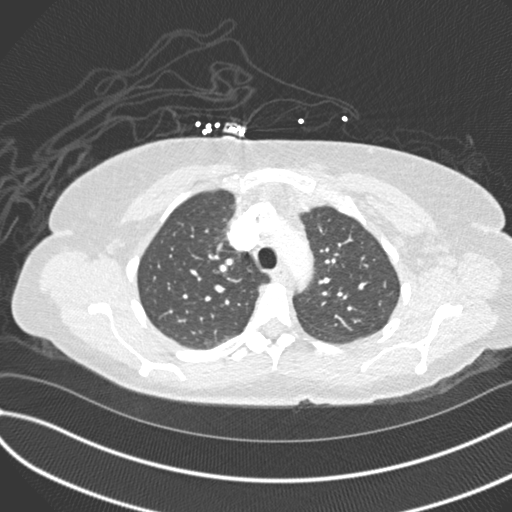
[im 192/260  soft-tissue]
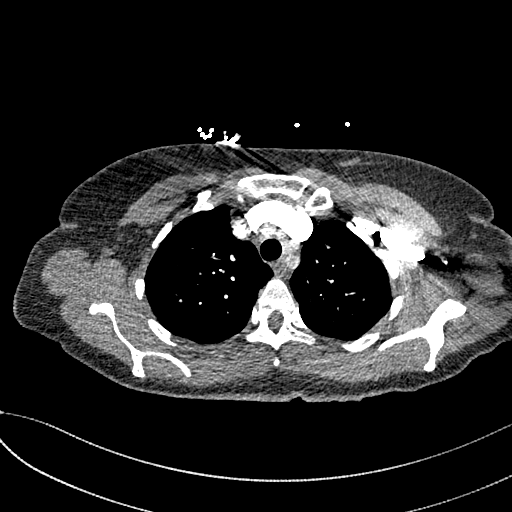
[im 214/260  lung]
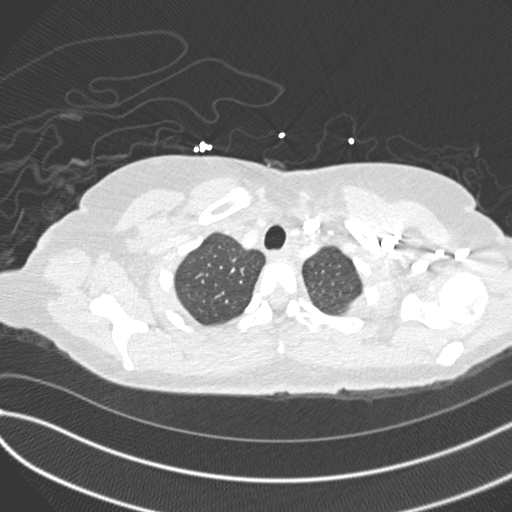
[im 226/260  soft-tissue]
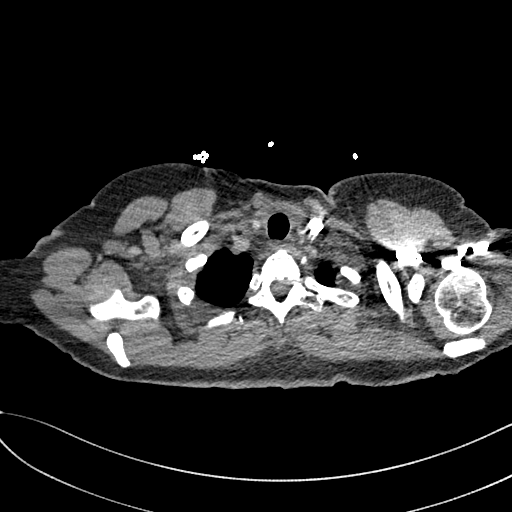
[im 248/260  lung]
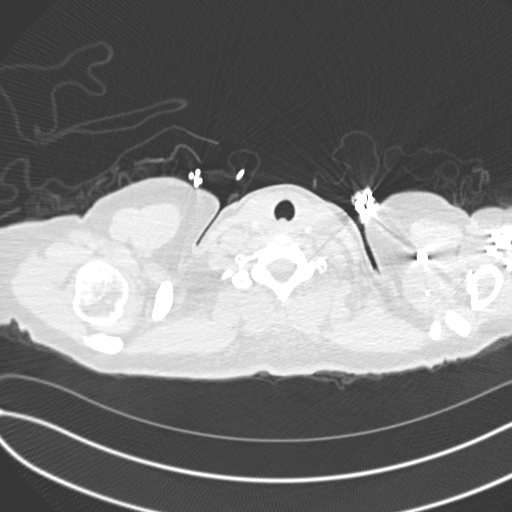

[Series 8: coronal mpr · coronal · 0.49mm/px · 3 of 139 slices shown]
[im 35/139  soft-tissue]
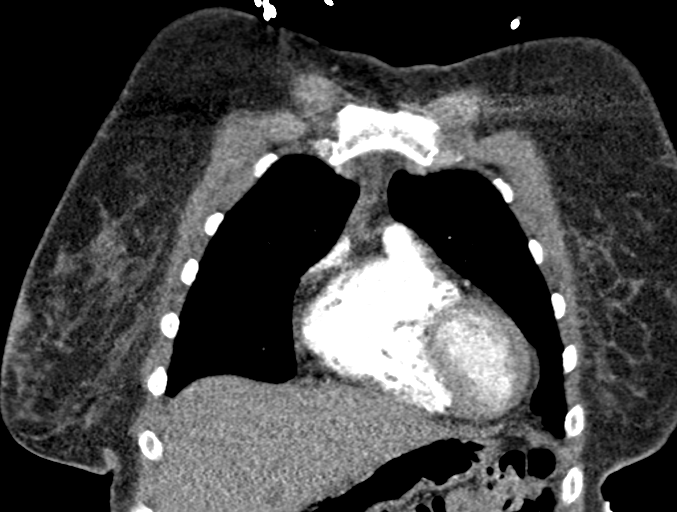
[im 70/139  soft-tissue]
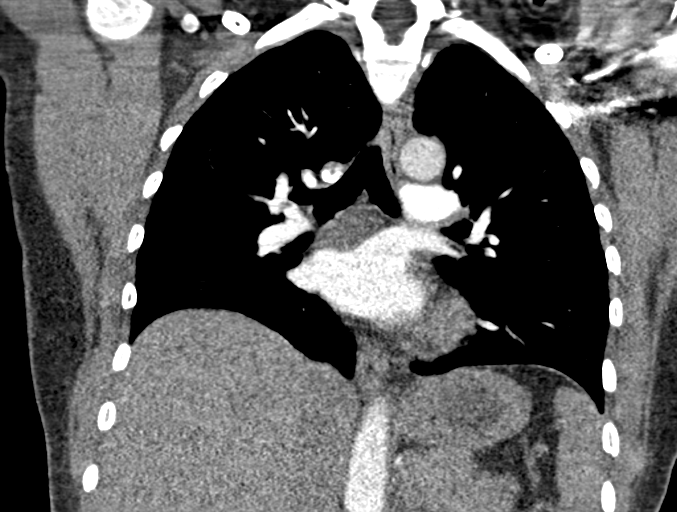
[im 104/139  soft-tissue]
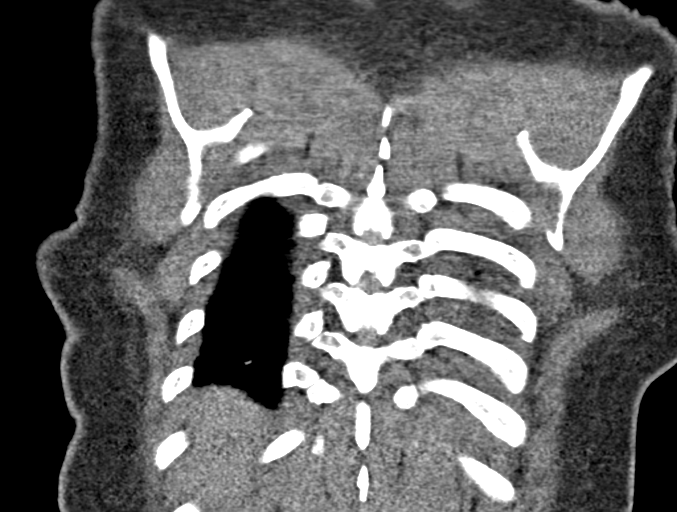

[18 of 46 positions shown; findings below may reference images not displayed]

FINDINGS: Cardiovascular: Satisfactory opacification of the pulmonary arteries
to the segmental level. No evidence of pulmonary embolism. Normal
heart size. No pericardial effusion. Nonaneurysmal aorta.

Mediastinum/Nodes: No enlarged mediastinal, hilar, or axillary lymph
nodes. Thyroid gland, trachea, and esophagus demonstrate no
significant findings.

Lungs/Pleura: Lungs are clear. No pleural effusion or pneumothorax.

Upper Abdomen: No acute abnormality.

Musculoskeletal: No chest wall abnormality. No acute or significant
osseous findings.

Review of the MIP images confirms the above findings.
IMPRESSION: Negative.  No CT evidence for acute pulmonary embolus

## 2022-01-20 ENCOUNTER — Other Ambulatory Visit (HOSPITAL_BASED_OUTPATIENT_CLINIC_OR_DEPARTMENT_OTHER): Payer: Self-pay

## 2022-02-12 ENCOUNTER — Other Ambulatory Visit (HOSPITAL_BASED_OUTPATIENT_CLINIC_OR_DEPARTMENT_OTHER): Payer: Self-pay

## 2022-02-13 ENCOUNTER — Other Ambulatory Visit (HOSPITAL_BASED_OUTPATIENT_CLINIC_OR_DEPARTMENT_OTHER): Payer: Self-pay

## 2022-03-10 ENCOUNTER — Other Ambulatory Visit (HOSPITAL_BASED_OUTPATIENT_CLINIC_OR_DEPARTMENT_OTHER): Payer: Self-pay

## 2022-03-10 ENCOUNTER — Encounter: Payer: Self-pay | Admitting: Family Medicine

## 2022-03-10 ENCOUNTER — Telehealth (INDEPENDENT_AMBULATORY_CARE_PROVIDER_SITE_OTHER): Payer: No Typology Code available for payment source | Admitting: Family Medicine

## 2022-03-10 VITALS — Ht 65.5 in | Wt 190.0 lb

## 2022-03-10 DIAGNOSIS — G43909 Migraine, unspecified, not intractable, without status migrainosus: Secondary | ICD-10-CM

## 2022-03-10 MED ORDER — SUMATRIPTAN SUCCINATE 100 MG PO TABS
100.0000 mg | ORAL_TABLET | Freq: Once | ORAL | 1 refills | Status: DC
  Filled 2022-03-10: qty 9, 30d supply, fill #0
  Filled 2022-08-06: qty 9, 30d supply, fill #1

## 2022-03-10 NOTE — Telephone Encounter (Signed)
Advise--we have never discussed her migraines (just r/f her topamax). There is no record of imitrex in her chart. She can try excedrin migraine. She can schedule an appt (could give toradol shot for a current headache, and discuss other abortive treatment options for migraines).

## 2022-03-10 NOTE — Progress Notes (Signed)
   Subjective:    Patient ID: Donna Mccarthy, female    DOB: 02/26/1976, 46 y.o.   MRN: [id number]  HPI Documentation for virtual audio and video telecommunications through [place] encounter: The patient was located at home. 2 patient identifiers used.  The provider was located in the office. The patient did consent to this visit and is aware of possible charges through their insurance for this visit. The other persons participating in this telemedicine service were none. Time spent on call was 5 minutes and in review of previous records >15 minutes total for counseling and coordination of care. This virtual service is not related to other E/M service within previous 7 days.  She states that on Saturday she had the onset of left sided throbbing headache with nausea, photophobia and phonophobia.  She had tried Excedrin with minimal relief.  Does have a previous history of difficulty with this and presently is on Topamax.  She tried Imitrex in the past but does not have any.  Review of Systems     Objective:   Physical Exam Alert and in no distress.  She appears uncomfortable.       Assessment & Plan:  Migraine without status migrainosus, not intractable, unspecified migraine type - Plan: SUMAtriptan (IMITREX) 100 MG tablet Recommend she also use Naprosyn with the Imitrex and repeat this in 2 hours if needed.  She expressed understanding of this and will keep [place] informed.

## 2022-03-11 ENCOUNTER — Telehealth: Payer: No Typology Code available for payment source | Admitting: Physician Assistant

## 2022-03-11 ENCOUNTER — Other Ambulatory Visit (HOSPITAL_BASED_OUTPATIENT_CLINIC_OR_DEPARTMENT_OTHER): Payer: Self-pay

## 2022-03-11 DIAGNOSIS — R3989 Other symptoms and signs involving the genitourinary system: Secondary | ICD-10-CM

## 2022-03-11 MED ORDER — NITROFURANTOIN MONOHYD MACRO 100 MG PO CAPS
100.0000 mg | ORAL_CAPSULE | Freq: Two times a day (BID) | ORAL | 0 refills | Status: DC
  Filled 2022-03-11: qty 10, 5d supply, fill #0

## 2022-03-11 NOTE — Progress Notes (Signed)

## 2022-03-11 NOTE — Progress Notes (Signed)
I have spent 5 minutes in review of e-visit questionnaire, review and updating patient chart, medical decision making and response to patient.   Nyaisha Simao Cody Henlee Donovan, PA-C    

## 2022-03-19 ENCOUNTER — Other Ambulatory Visit: Payer: No Typology Code available for payment source

## 2022-03-20 ENCOUNTER — Other Ambulatory Visit (HOSPITAL_BASED_OUTPATIENT_CLINIC_OR_DEPARTMENT_OTHER): Payer: Self-pay

## 2022-03-20 ENCOUNTER — Other Ambulatory Visit: Payer: Self-pay | Admitting: Family Medicine

## 2022-03-20 DIAGNOSIS — G43909 Migraine, unspecified, not intractable, without status migrainosus: Secondary | ICD-10-CM

## 2022-03-20 MED ORDER — TOPIRAMATE 25 MG PO TABS
75.0000 mg | ORAL_TABLET | Freq: Every day | ORAL | 0 refills | Status: DC
  Filled 2022-03-20: qty 270, 90d supply, fill #0

## 2022-03-20 NOTE — Telephone Encounter (Signed)
Is this okay to fill, she has CPE coming up in Sept?

## 2022-04-15 ENCOUNTER — Other Ambulatory Visit (HOSPITAL_BASED_OUTPATIENT_CLINIC_OR_DEPARTMENT_OTHER): Payer: Self-pay

## 2022-04-23 ENCOUNTER — Encounter: Payer: Self-pay | Admitting: Internal Medicine

## 2022-05-13 NOTE — Patient Instructions (Incomplete)
  HEALTH MAINTENANCE RECOMMENDATIONS:  It is recommended that you get at least 30 minutes of aerobic exercise at least 5 days/week (for weight loss, you may need as much as 60-90 minutes). This can be any activity that gets your heart rate up. This can be divided in 10-15 minute intervals if needed, but try and build up your endurance at least once a week.  Weight bearing exercise is also recommended twice weekly.  Eat a healthy diet with lots of vegetables, fruits and fiber.  "Colorful" foods have a lot of vitamins (ie green vegetables, tomatoes, red peppers, etc).  Limit sweet tea, regular sodas and alcoholic beverages, all of which has a lot of calories and sugar.  Up to 1 alcoholic drink daily may be beneficial for women (unless trying to lose weight, watch sugars).  Drink a lot of water.  Calcium recommendations are 1200-1500 mg daily (1500 mg for postmenopausal women or women without ovaries), and vitamin D 1000 IU daily.  This should be obtained from diet and/or supplements (vitamins), and calcium should not be taken all at once, but in divided doses.  Monthly self breast exams and yearly mammograms for women over the age of 30 is recommended.  Sunscreen of at least SPF 30 should be used on all sun-exposed parts of the skin when outside between the hours of 10 am and 4 pm (not just when at beach or pool, but even with exercise, golf, tennis, and yard work!)  Use a sunscreen that says "broad spectrum" so it covers both UVA and UVB rays, and make sure to reapply every 1-2 hours.  Remember to change the batteries in your smoke detectors when changing your clock times in the spring and fall. Carbon monoxide detectors are recommended for your home.  Use your seat belt every time you are in a car, and please drive safely and not be distracted with cell phones and texting while driving.  We need to find out when your last TdaP was--we will try and get this from health at work.  You should call your  GI to schedule a colonoscopy when you are 47. Let us know if referral is needed.    Be sure to schedule your GYN follow-up (not sure when next pap is due), thinking it is soon.  Use warm compresses to your back and ribs. Take 2 Aleve twice daily with food. Contact us if you develop any rash (can't rule out shingles)

## 2022-05-13 NOTE — Progress Notes (Unsigned)
No chief complaint on file.  Donna Mccarthy is a 46 y.o. female who presents for a complete physical.  She is under the care of Dr. Mancel Bale for GYN care. 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 She had IUD replaced in 10/2021.  She uses norethindrone prn for heavy bleeding. She has known fibroids ([place] 06/2021), and neg EMB (06/2021) per GYN notes.  She has the following concerns:   She was treated for UTI in July via e-visit, with macrobid x 5 days. She also had migraine in July, had virtual visit with Dr. Redmond School, and was given imitrex. She has history of migraines, and takes topirimate $RemoveBeforeDE'75mg'BYTvqommDJpeOQc$  daily for prevention.  Last seen by me in May, when she reported feeling anxious, panicky, palpitations.  She was found to be over-replaced on her thyroid medications. Her hypothyroidism is managed by Dr. Chalmers Cater.  Dose at last visit (when TSH was low)--she was taking $RemoveBe'120mg'xwIJBtIiJ$  tabs, two of them ($Remove'240mg'jvFmOJQ$ ) Monday through Thursday, and taking 1 tablet ($RemoveB'120mg'FTeDpaoD$ ) on Friday through Sunday. Lab Results  Component Value Date   TSH 0.072 (L) 12/31/2021   Dr. Chalmers Cater adjusted her dose UPDATE     Immunization History  Administered Date(s) Administered   Influenza,inj,Quad PF,6+ Mos 05/28/2020   PFIZER(Purple Top)SARS-COV-2 Vaccination 10/18/2019, 11/11/2019  Gets yearly flu shots (required by job) Last pap: 05/2021 (result not in chart--abnl, had LEEP 08/2021) Last mammo: 05/2020 DEXA: never Colonoscopy: never Dentist:  Ophtho:   Vitamin D-OH screen normal, 35.9 in 04/2021 Lipid screen: Lab Results  Component Value Date   CHOL 146 04/27/2020   HDL 57 04/27/2020   LDLCALC 81 04/27/2020   TRIG 30 04/27/2020   CHOLHDL 2.6 04/27/2020     PMH, PSH, SH and FH were reviewed and updated   ROS: The patient denies anorexia, fever, weight changes, vision changes, decreased hearing, ear pain, sore throat, breast concerns, chest pain, palpitations, dizziness, syncope, dyspnea on exertion,  cough, swelling, nausea, vomiting, diarrhea, constipation, abdominal pain, melena, hematochezia, indigestion/heartburn, hematuria, incontinence, dysuria, vaginal discharge, odor or itch, genital lesions, joint pains, numbness, tingling, weakness, tremor, suspicious skin lesions, depression, anxiety, abnormal bleeding/bruising, or enlarged lymph nodes.  Migraines Irregular spotting? Palpitations? GERD? Moods   UPDATE ALL   PHYSICAL EXAM:  There were no vitals taken for this visit.  Wt Readings from Last 3 Encounters:  03/10/22 190 lb (86.2 kg)  12/25/21 191 lb 6.4 oz (86.8 kg)  05/10/21 180 lb (81.6 kg)   General Appearance:    Alert, cooperative, no distress, appears stated age  Head:    Normocephalic, without obvious abnormality, atraumatic  Eyes:    PERRL, conjunctiva/corneas clear, EOM's intact  Ears:    Normal TM's and external ear canals  Nose:   Normal, no drainage or sinus tenderness  Throat:   Normal mucosa, no lesions  Neck:   Supple, no lymphadenopathy;  thyroid:  no enlargement/ tenderness/nodules; no JVD  Back:    Spine nontender, no curvature, ROM normal, no CVA tenderness  Lungs:     Clear to auscultation bilaterally without wheezes, rales or ronchi; respirations unlabored  Chest Wall:    No tenderness or deformity   Heart:    Regular rate and rhythm, S1 and S2 normal, no murmur, rub   or gallop  Breast Exam:    Deferred to OB/GYN   Abdomen:     Soft, non-tender, nondistended, normoactive bowel sounds,    no masses, no hepatosplenomegaly  Genitalia:    Deferred to  OB/GYN        Extremities:   No clubbing, cyanosis or edema  Pulses:   2+ and symmetric all extremities  Skin:   Skin color, texture, turgor normal, no rashes or lesions  Lymph nodes:   Cervical, supraclavicular, and axillary nodes normal  Neurologic:   CNII-XII intact, normal strength, sensation and gait                                 Psych:   Normal mood, affect, hygiene and grooming.                ASSESSMENT/PLAN:  Any COVID boosters? Has she had mammo since 05/2020?  If so, need results WE NEVER GOT PAP SMEAR RESULTS FROM 05/2021 WHICH WERE ABNL--DR ROBERTS. WE GOT NOTES, NOT PAP RESULTS Please get and abstract (were abnormal).  RF topamax?  C-met (glu elev last year, ?fasting), CBC D if change in supplements, lipid if change in diet   Discussed monthly self breast exams and yearly mammograms after the age of 69; at least 30 minutes of aerobic activity at least 5 days/week, weight-bearing exercise at least 2x/week; proper sunscreen use reviewed; healthy diet, including goals of calcium and vitamin D intake and alcohol recommendations (less than or equal to 1 drink/day) reviewed; regular seatbelt use; changing batteries in smoke detectors.  Immunization recommendations discussed--encouraged yearly flu shots (required by job), COVID booster.  Colonoscopy recommendations reviewed, due age 39 (January 2024).

## 2022-05-14 ENCOUNTER — Encounter: Payer: Self-pay | Admitting: Family Medicine

## 2022-05-14 ENCOUNTER — Other Ambulatory Visit (HOSPITAL_BASED_OUTPATIENT_CLINIC_OR_DEPARTMENT_OTHER): Payer: Self-pay

## 2022-05-14 ENCOUNTER — Ambulatory Visit (INDEPENDENT_AMBULATORY_CARE_PROVIDER_SITE_OTHER): Payer: No Typology Code available for payment source | Admitting: Family Medicine

## 2022-05-14 VITALS — BP 110/66 | HR 64 | Ht 65.0 in | Wt 200.2 lb

## 2022-05-14 DIAGNOSIS — G43909 Migraine, unspecified, not intractable, without status migrainosus: Secondary | ICD-10-CM | POA: Diagnosis not present

## 2022-05-14 DIAGNOSIS — L989 Disorder of the skin and subcutaneous tissue, unspecified: Secondary | ICD-10-CM

## 2022-05-14 DIAGNOSIS — Z789 Other specified health status: Secondary | ICD-10-CM

## 2022-05-14 DIAGNOSIS — Z0000 Encounter for general adult medical examination without abnormal findings: Secondary | ICD-10-CM | POA: Diagnosis not present

## 2022-05-14 DIAGNOSIS — R0789 Other chest pain: Secondary | ICD-10-CM

## 2022-05-14 DIAGNOSIS — E039 Hypothyroidism, unspecified: Secondary | ICD-10-CM

## 2022-05-14 DIAGNOSIS — E559 Vitamin D deficiency, unspecified: Secondary | ICD-10-CM

## 2022-05-14 DIAGNOSIS — Z5181 Encounter for therapeutic drug level monitoring: Secondary | ICD-10-CM

## 2022-05-14 LAB — POCT URINALYSIS DIP (PROADVANTAGE DEVICE)
Bilirubin, UA: NEGATIVE
Blood, UA: NEGATIVE
Glucose, UA: NEGATIVE mg/dL
Ketones, POC UA: NEGATIVE mg/dL
Nitrite, UA: NEGATIVE
Protein Ur, POC: NEGATIVE mg/dL
Specific Gravity, Urine: 1.005
Urobilinogen, Ur: NEGATIVE
pH, UA: 7.5 (ref 5.0–8.0)

## 2022-05-14 MED ORDER — MUPIROCIN 2 % EX OINT
1.0000 | TOPICAL_OINTMENT | Freq: Two times a day (BID) | CUTANEOUS | 0 refills | Status: DC
  Filled 2022-05-14: qty 22, 11d supply, fill #0

## 2022-05-14 MED ORDER — TOPIRAMATE 25 MG PO TABS
75.0000 mg | ORAL_TABLET | Freq: Every day | ORAL | 3 refills | Status: DC
  Filled 2022-05-14 – 2022-07-28 (×2): qty 270, 90d supply, fill #0

## 2022-05-15 ENCOUNTER — Telehealth: Payer: Self-pay | Admitting: *Deleted

## 2022-05-15 NOTE — Telephone Encounter (Signed)
Ok to add on

## 2022-05-15 NOTE — Telephone Encounter (Signed)
Patient called and has an rx from Dr. Chalmers Cater to add a TSH on to labs from yesterday-is this ok? Dx E03.9.

## 2022-05-16 ENCOUNTER — Encounter: Payer: Self-pay | Admitting: Family Medicine

## 2022-05-16 LAB — CBC WITH DIFFERENTIAL/PLATELET
Basophils Absolute: 0 10*3/uL (ref 0.0–0.2)
Basos: 1 %
EOS (ABSOLUTE): 0.1 10*3/uL (ref 0.0–0.4)
Eos: 1 %
Hematocrit: 35.7 % (ref 34.0–46.6)
Hemoglobin: 11.5 g/dL (ref 11.1–15.9)
Immature Grans (Abs): 0 10*3/uL (ref 0.0–0.1)
Immature Granulocytes: 0 %
Lymphocytes Absolute: 2.3 10*3/uL (ref 0.7–3.1)
Lymphs: 47 %
MCH: 27.2 pg (ref 26.6–33.0)
MCHC: 32.2 g/dL (ref 31.5–35.7)
MCV: 84 fL (ref 79–97)
Monocytes Absolute: 0.3 10*3/uL (ref 0.1–0.9)
Monocytes: 7 %
Neutrophils Absolute: 2.1 10*3/uL (ref 1.4–7.0)
Neutrophils: 44 %
Platelets: 180 10*3/uL (ref 150–450)
RBC: 4.23 x10E6/uL (ref 3.77–5.28)
RDW: 13.2 % (ref 11.7–15.4)
WBC: 4.9 10*3/uL (ref 3.4–10.8)

## 2022-05-16 LAB — COMPREHENSIVE METABOLIC PANEL
ALT: 10 IU/L (ref 0–32)
AST: 18 IU/L (ref 0–40)
Albumin/Globulin Ratio: 1.6 (ref 1.2–2.2)
Albumin: 4.5 g/dL (ref 3.9–4.9)
Alkaline Phosphatase: 71 IU/L (ref 44–121)
BUN/Creatinine Ratio: 12 (ref 9–23)
BUN: 9 mg/dL (ref 6–24)
Bilirubin Total: 0.6 mg/dL (ref 0.0–1.2)
CO2: 21 mmol/L (ref 20–29)
Calcium: 9.4 mg/dL (ref 8.7–10.2)
Chloride: 108 mmol/L — ABNORMAL HIGH (ref 96–106)
Creatinine, Ser: 0.75 mg/dL (ref 0.57–1.00)
Globulin, Total: 2.9 g/dL (ref 1.5–4.5)
Glucose: 91 mg/dL (ref 70–99)
Potassium: 4.5 mmol/L (ref 3.5–5.2)
Sodium: 143 mmol/L (ref 134–144)
Total Protein: 7.4 g/dL (ref 6.0–8.5)
eGFR: 101 mL/min/{1.73_m2} (ref >59)

## 2022-05-16 LAB — LIPID PANEL
Chol/HDL Ratio: 2.4 ratio (ref 0.0–4.4)
Cholesterol, Total: 160 mg/dL (ref 100–199)
HDL: 66 mg/dL (ref >39)
LDL Chol Calc (NIH): 84 mg/dL (ref 0–99)
Triglycerides: 44 mg/dL (ref 0–149)
VLDL Cholesterol Cal: 10 mg/dL (ref 5–40)

## 2022-05-16 LAB — VITAMIN D 25 HYDROXY (VIT D DEFICIENCY, FRACTURES): Vit D, 25-Hydroxy: 25.8 ng/mL — ABNORMAL LOW (ref 30.0–100.0)

## 2022-05-16 LAB — VITAMIN B12: Vitamin B-12: 811 pg/mL (ref 232–1245)

## 2022-05-18 LAB — SPECIMEN STATUS REPORT

## 2022-05-18 LAB — TSH: TSH: 7.18 u[IU]/mL — ABNORMAL HIGH (ref 0.450–4.500)

## 2022-05-19 ENCOUNTER — Other Ambulatory Visit (HOSPITAL_BASED_OUTPATIENT_CLINIC_OR_DEPARTMENT_OTHER): Payer: Self-pay

## 2022-05-19 MED ORDER — DOXYCYCLINE HYCLATE 100 MG PO TABS
100.0000 mg | ORAL_TABLET | Freq: Two times a day (BID) | ORAL | 0 refills | Status: DC
  Filled 2022-05-19 – 2022-05-30 (×2): qty 20, 10d supply, fill #0

## 2022-05-27 ENCOUNTER — Encounter: Payer: Self-pay | Admitting: Internal Medicine

## 2022-05-28 ENCOUNTER — Encounter: Payer: Self-pay | Admitting: *Deleted

## 2022-05-29 ENCOUNTER — Other Ambulatory Visit (HOSPITAL_BASED_OUTPATIENT_CLINIC_OR_DEPARTMENT_OTHER): Payer: Self-pay

## 2022-05-30 ENCOUNTER — Other Ambulatory Visit: Payer: Self-pay

## 2022-05-30 ENCOUNTER — Other Ambulatory Visit (HOSPITAL_BASED_OUTPATIENT_CLINIC_OR_DEPARTMENT_OTHER): Payer: Self-pay

## 2022-06-05 ENCOUNTER — Other Ambulatory Visit (HOSPITAL_BASED_OUTPATIENT_CLINIC_OR_DEPARTMENT_OTHER): Payer: Self-pay

## 2022-06-05 MED ORDER — WEGOVY 0.25 MG/0.5ML ~~LOC~~ SOAJ
0.2500 mg | SUBCUTANEOUS | 5 refills | Status: DC
  Filled 2022-06-05: qty 2, 28d supply, fill #0
  Filled 2022-08-06: qty 2, 28d supply, fill #1

## 2022-06-05 MED ORDER — THYROID 60 MG PO TABS
60.0000 mg | ORAL_TABLET | Freq: Every day | ORAL | 5 refills | Status: DC
  Filled 2022-06-05: qty 4, 4d supply, fill #0
  Filled 2022-06-06: qty 26, 26d supply, fill #0

## 2022-06-05 MED ORDER — THYROID 120 MG PO TABS
120.0000 mg | ORAL_TABLET | Freq: Every day | ORAL | 5 refills | Status: DC
  Filled 2022-06-05 – 2022-08-06 (×2): qty 30, 30d supply, fill #0
  Filled 2022-09-15: qty 30, 30d supply, fill #1

## 2022-06-05 MED ORDER — THYROID 60 MG PO TABS
60.0000 mg | ORAL_TABLET | Freq: Every day | ORAL | 5 refills | Status: DC
  Filled 2022-06-05 – 2022-07-04 (×2): qty 30, 30d supply, fill #0
  Filled 2022-08-06: qty 30, 30d supply, fill #1
  Filled 2022-09-15: qty 30, 30d supply, fill #2

## 2022-06-05 MED ORDER — THYROID 120 MG PO TABS
ORAL_TABLET | ORAL | 5 refills | Status: DC
  Filled 2022-06-05: qty 30, 30d supply, fill #0
  Filled 2022-07-04: qty 30, 30d supply, fill #1

## 2022-06-06 ENCOUNTER — Other Ambulatory Visit (HOSPITAL_BASED_OUTPATIENT_CLINIC_OR_DEPARTMENT_OTHER): Payer: Self-pay

## 2022-06-09 ENCOUNTER — Other Ambulatory Visit (HOSPITAL_BASED_OUTPATIENT_CLINIC_OR_DEPARTMENT_OTHER): Payer: Self-pay

## 2022-06-09 MED ORDER — WEGOVY 0.25 MG/0.5ML ~~LOC~~ SOAJ
0.5000 mL | SUBCUTANEOUS | 5 refills | Status: DC
  Filled 2022-07-01 – 2022-09-03 (×5): qty 2, 28d supply, fill #0

## 2022-06-10 ENCOUNTER — Encounter (HOSPITAL_BASED_OUTPATIENT_CLINIC_OR_DEPARTMENT_OTHER): Payer: Self-pay

## 2022-06-10 ENCOUNTER — Other Ambulatory Visit (HOSPITAL_BASED_OUTPATIENT_CLINIC_OR_DEPARTMENT_OTHER): Payer: Self-pay

## 2022-06-17 ENCOUNTER — Encounter: Payer: Self-pay | Admitting: Family Medicine

## 2022-07-01 ENCOUNTER — Other Ambulatory Visit (HOSPITAL_BASED_OUTPATIENT_CLINIC_OR_DEPARTMENT_OTHER): Payer: Self-pay

## 2022-07-02 ENCOUNTER — Other Ambulatory Visit (HOSPITAL_BASED_OUTPATIENT_CLINIC_OR_DEPARTMENT_OTHER): Payer: Self-pay

## 2022-07-03 ENCOUNTER — Other Ambulatory Visit (HOSPITAL_BASED_OUTPATIENT_CLINIC_OR_DEPARTMENT_OTHER): Payer: Self-pay

## 2022-07-04 ENCOUNTER — Other Ambulatory Visit (HOSPITAL_BASED_OUTPATIENT_CLINIC_OR_DEPARTMENT_OTHER): Payer: Self-pay

## 2022-07-07 ENCOUNTER — Other Ambulatory Visit (HOSPITAL_BASED_OUTPATIENT_CLINIC_OR_DEPARTMENT_OTHER): Payer: Self-pay

## 2022-07-07 MED ORDER — WEGOVY 0.5 MG/0.5ML ~~LOC~~ SOAJ
0.5000 mg | SUBCUTANEOUS | 5 refills | Status: DC
  Filled 2022-07-07: qty 4, 28d supply, fill #0
  Filled 2022-07-08 – 2022-09-24 (×3): qty 2, 28d supply, fill #0

## 2022-07-08 ENCOUNTER — Other Ambulatory Visit (HOSPITAL_BASED_OUTPATIENT_CLINIC_OR_DEPARTMENT_OTHER): Payer: Self-pay

## 2022-07-14 ENCOUNTER — Other Ambulatory Visit (HOSPITAL_BASED_OUTPATIENT_CLINIC_OR_DEPARTMENT_OTHER): Payer: Self-pay

## 2022-07-17 ENCOUNTER — Other Ambulatory Visit (HOSPITAL_BASED_OUTPATIENT_CLINIC_OR_DEPARTMENT_OTHER): Payer: Self-pay

## 2022-07-25 ENCOUNTER — Other Ambulatory Visit (HOSPITAL_BASED_OUTPATIENT_CLINIC_OR_DEPARTMENT_OTHER): Payer: Self-pay

## 2022-07-28 ENCOUNTER — Other Ambulatory Visit (HOSPITAL_BASED_OUTPATIENT_CLINIC_OR_DEPARTMENT_OTHER): Payer: Self-pay

## 2022-08-04 ENCOUNTER — Other Ambulatory Visit (HOSPITAL_BASED_OUTPATIENT_CLINIC_OR_DEPARTMENT_OTHER): Payer: Self-pay

## 2022-08-06 ENCOUNTER — Other Ambulatory Visit (HOSPITAL_BASED_OUTPATIENT_CLINIC_OR_DEPARTMENT_OTHER): Payer: Self-pay

## 2022-08-06 ENCOUNTER — Other Ambulatory Visit: Payer: Self-pay

## 2022-08-07 ENCOUNTER — Other Ambulatory Visit (HOSPITAL_BASED_OUTPATIENT_CLINIC_OR_DEPARTMENT_OTHER): Payer: Self-pay

## 2022-08-22 ENCOUNTER — Other Ambulatory Visit (HOSPITAL_BASED_OUTPATIENT_CLINIC_OR_DEPARTMENT_OTHER): Payer: Self-pay

## 2022-08-24 NOTE — Progress Notes (Unsigned)
No chief complaint on file.    In May 2023 she had reported feeling anxious, panicky, palpitations. She was found to be over-replaced on her thyroid medications (TSH 0.072). Her hypothyroidism is managed by Dr. Chalmers Cater.  Dose at that time was 120mg  tabs, two of them (240mg ) Monday through Thursday, and taking 1 tablet (120mg ) on Friday through Sunday. In 04/2022 she reported taking 120mg  Mon through Fri, 240 on Sat/Sun.  Patient had reported having a normal TSH on this regimen, since dose adjustment. Rechecked at her physical, and TSH was elevated.  Results were sent to Dr. Chalmers Cater. Pt states  Lab Results  Component Value Date   TSH 7.180 (H) 05/15/2022   Vitamin D deficiency: last level was low in 04/2022 at 25.8, when she reported only sporadically taking a D3 supplement of 1000 IU. She is currently taking  She was started on [facility] in October by Dr. Chalmers Cater.   PMH, PSH, SH reviewed   ROS:   PHYSICAL EXAM:  There were no vitals taken for this visit.  Wt Readings from Last 3 Encounters:  05/14/22 200 lb 3.2 oz (90.8 kg)  03/10/22 190 lb (86.2 kg)  12/25/21 191 lb 6.4 oz (86.8 kg)      ASSESSMENT/PLAN:  GAD-7 PHQ also if any depression sx  No notes/labs/records from Dr. Chalmers Cater.  Not sure when last TSH was. Looks like Chalmers Cater started her on [employer] Enter flu shot Did she ever find out her TdaP date from health at work?  Need to know--10 years from date that we have in system. ?if needs another  ?vitamin D or TSH needed?

## 2022-08-25 ENCOUNTER — Ambulatory Visit (INDEPENDENT_AMBULATORY_CARE_PROVIDER_SITE_OTHER): Payer: 59 | Admitting: Family Medicine

## 2022-08-25 ENCOUNTER — Encounter: Payer: Self-pay | Admitting: Family Medicine

## 2022-08-25 VITALS — BP 110/70 | HR 60 | Ht 65.0 in | Wt 196.4 lb

## 2022-08-25 DIAGNOSIS — F321 Major depressive disorder, single episode, moderate: Secondary | ICD-10-CM

## 2022-08-25 DIAGNOSIS — E039 Hypothyroidism, unspecified: Secondary | ICD-10-CM

## 2022-08-25 DIAGNOSIS — Z23 Encounter for immunization: Secondary | ICD-10-CM | POA: Diagnosis not present

## 2022-08-25 DIAGNOSIS — E559 Vitamin D deficiency, unspecified: Secondary | ICD-10-CM | POA: Diagnosis not present

## 2022-08-26 ENCOUNTER — Other Ambulatory Visit (HOSPITAL_BASED_OUTPATIENT_CLINIC_OR_DEPARTMENT_OTHER): Payer: Self-pay

## 2022-08-26 ENCOUNTER — Encounter: Payer: Self-pay | Admitting: Family Medicine

## 2022-08-26 LAB — TSH: TSH: 1.4 u[IU]/mL (ref 0.450–4.500)

## 2022-08-26 LAB — VITAMIN D 25 HYDROXY (VIT D DEFICIENCY, FRACTURES): Vit D, 25-Hydroxy: 21.7 ng/mL — ABNORMAL LOW (ref 30.0–100.0)

## 2022-08-26 MED ORDER — BUPROPION HCL ER (XL) 300 MG PO TB24
300.0000 mg | ORAL_TABLET | Freq: Every day | ORAL | 0 refills | Status: DC
  Filled 2022-08-26 – 2022-09-15 (×2): qty 90, 90d supply, fill #0

## 2022-08-26 MED ORDER — BUPROPION HCL ER (XL) 150 MG PO TB24
150.0000 mg | ORAL_TABLET | Freq: Every morning | ORAL | 0 refills | Status: DC
  Filled 2022-08-26: qty 30, 18d supply, fill #0

## 2022-08-26 MED ORDER — VITAMIN D (ERGOCALCIFEROL) 1.25 MG (50000 UNIT) PO CAPS
50000.0000 [IU] | ORAL_CAPSULE | ORAL | 0 refills | Status: DC
  Filled 2022-08-26: qty 12, 84d supply, fill #0

## 2022-08-30 ENCOUNTER — Other Ambulatory Visit (HOSPITAL_BASED_OUTPATIENT_CLINIC_OR_DEPARTMENT_OTHER): Payer: Self-pay

## 2022-09-02 ENCOUNTER — Other Ambulatory Visit: Payer: Self-pay

## 2022-09-03 ENCOUNTER — Other Ambulatory Visit: Payer: Self-pay

## 2022-09-03 ENCOUNTER — Other Ambulatory Visit (HOSPITAL_BASED_OUTPATIENT_CLINIC_OR_DEPARTMENT_OTHER): Payer: Self-pay

## 2022-09-10 DIAGNOSIS — N926 Irregular menstruation, unspecified: Secondary | ICD-10-CM | POA: Diagnosis not present

## 2022-09-10 DIAGNOSIS — E039 Hypothyroidism, unspecified: Secondary | ICD-10-CM | POA: Diagnosis not present

## 2022-09-10 DIAGNOSIS — E049 Nontoxic goiter, unspecified: Secondary | ICD-10-CM | POA: Diagnosis not present

## 2022-09-10 DIAGNOSIS — E669 Obesity, unspecified: Secondary | ICD-10-CM | POA: Diagnosis not present

## 2022-09-15 ENCOUNTER — Other Ambulatory Visit (HOSPITAL_BASED_OUTPATIENT_CLINIC_OR_DEPARTMENT_OTHER): Payer: Self-pay

## 2022-09-15 ENCOUNTER — Other Ambulatory Visit: Payer: Self-pay | Admitting: Family Medicine

## 2022-09-15 ENCOUNTER — Other Ambulatory Visit: Payer: Self-pay

## 2022-09-15 DIAGNOSIS — F321 Major depressive disorder, single episode, moderate: Secondary | ICD-10-CM

## 2022-09-16 ENCOUNTER — Other Ambulatory Visit (HOSPITAL_BASED_OUTPATIENT_CLINIC_OR_DEPARTMENT_OTHER): Payer: Self-pay

## 2022-09-18 ENCOUNTER — Telehealth (INDEPENDENT_AMBULATORY_CARE_PROVIDER_SITE_OTHER): Payer: 59 | Admitting: Family Medicine

## 2022-09-18 ENCOUNTER — Encounter: Payer: Self-pay | Admitting: Family Medicine

## 2022-09-18 VITALS — Temp 98.0°F | Ht 65.0 in | Wt 185.0 lb

## 2022-09-18 DIAGNOSIS — U071 COVID-19: Secondary | ICD-10-CM | POA: Diagnosis not present

## 2022-09-18 NOTE — Progress Notes (Signed)
Start time: 2:42 End time: 3:01  Virtual Visit via Video Note  I connected with Donna Mccarthy on 09/18/22 by a video enabled telemedicine application and verified that I am speaking with the correct person using two identifiers.  Location: Patient: home Provider: office   I discussed the limitations of evaluation and management by telemedicine and the availability of in person appointments. The patient expressed understanding and agreed to proceed.  History of Present Illness:  Chief Complaint  Patient presents with   Covid Positive    VIRTUAL positive covid test this afternoon. Symptoms started yesterday afternoon with scratchy throat, coughing, body aches and chills.    Last night she started with cough, felt very chilled.  Started sweating in the middle of the night. She woke up with HA, dizziness, had chills. She has scratchy throat, has been coughing. Some sinus congestion and pain; not getting much nasal drainage. Cough is dry, unable to get anything up.  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 Lots of sick patients with COVID at other locations she has worked).  OTC meds taken for symptoms Claritin this morning Dayquil 1:30 today  (Reports Tessalon Perles not helpful in the past)  Has had COVID vaccines x 2, no boosters.  PMH, PSH, SH reviewed  Outpatient Encounter Medications as of 09/18/2022  Medication Sig Note   buPROPion (WELLBUTRIN XL) 300 MG 24 hr tablet Take 1 tablet (300 mg total) by mouth daily.    levonorgestrel (MIRENA) 20 MCG/24HR IUD 1 each by Intrauterine route once.    loratadine (CLARITIN) 10 MG tablet Take 10 mg by mouth daily.    Magnesium 250 MG TABS Take by mouth.    melatonin 3 MG TABS tablet Take 2 tablet by mouth at bedtime.    NON FORMULARY Take 1 capsule by mouth daily. 05/14/2022: Sea Moss with Beets   Pseudoephedrine-APAP-DM (DAYQUIL PO) Take 2 capsules by  mouth as needed. 09/18/2022: Last dose 1:15pm   thyroid (NP THYROID) 120 MG tablet Take 1 tablet (120 mg total) by mouth daily on an empty stomach.    thyroid (NP THYROID) 60 MG tablet Take 1 tablet by mouth on an empty stomach once a day    topiramate (TOPAMAX) 25 MG tablet Take 3 tablets (75 mg total) by mouth at bedtime.    Vitamin D, Ergocalciferol, (DRISDOL) 1.25 MG (50000 UNIT) CAPS capsule Take 1 capsule (50,000 Units total) by mouth every 7 (seven) days.    ALPRAZolam (XANAX) 0.25 MG tablet Take 1 tablet (0.25 mg total) by mouth 3 (three) times daily as needed for anxiety. (Patient not taking: Reported on 09/18/2022) 09/18/2022: prn   aspirin-acetaminophen-caffeine (EXCEDRIN MIGRAINE) 250-250-65 MG tablet Take 2 tablets by mouth every 6 (six) hours as needed for headache. (Patient not taking: Reported on 05/14/2022) 05/14/2022: prn   naproxen (NAPROSYN) 500 MG tablet Take 1,500 mg by mouth 2 (two) times daily with a meal. (Patient not taking: Reported on 08/25/2022) 08/25/2022: prn   norethindrone (AYGESTIN) 5 MG tablet Take 2 tablets by mouth once daily (Patient not taking: Reported on 12/25/2021) 12/25/2021: prn   Semaglutide-Weight Management (WEGOVY) 0.25 MG/0.5ML SOAJ Inject 0.25 mg into the skin once a week. (Patient not taking: Reported on 08/25/2022) 09/18/2022: Has not started yet   Semaglutide-Weight Management (WEGOVY) 0.25 MG/0.5ML SOAJ Inject 0.25 mg into the skin once a week. (Patient not taking: Reported on 08/25/2022) 09/18/2022: Has not started yet  Semaglutide-Weight Management (WEGOVY) 0.5 MG/0.5ML SOAJ Inject 0.5 mg into the skin once a week. (Patient not taking: Reported on 08/25/2022) 09/18/2022: Has not started yet    SUMAtriptan (IMITREX) 100 MG tablet Take 1 tablet (100 mg total) by mouth once for 1 dose. May repeat in 2 hours if headache persists or recurs. (Patient not taking: Reported on 05/14/2022) 05/14/2022: prn   [DISCONTINUED] buPROPion (WELLBUTRIN XL) 150 MG 24 hr tablet Take 1 tablet  (150 mg total) by mouth in the morning. Increase to 2 tablets after 1 week.    [DISCONTINUED] Vitamin D, Cholecalciferol, 25 MCG (1000 UT) CAPS Take 1 capsule by mouth daily.    No facility-administered encounter medications on file as of 09/18/2022.   Allergies  Allergen Reactions   Penicillins Other (See Comments)    Mother said she had a rash   Toradol [Ketorolac Tromethamine] Other (See Comments)    "felt like whole body was on fire"   Celebrex [Celecoxib]     itching   ROS: URI symptoms per HPI. Decreased appetite.  Some nausea last night, better today.  No v/d. No chest pain, shortness of breath. No rashes or other concerns.   Observations/Objective:  Temp 98 F (36.7 C) (Temporal)   Ht 5\' 5"  (1.651 m)   Wt 185 lb (83.9 kg)   LMP 09/08/2022 (Approximate)   BMI 30.79 kg/m   Appears mildly ill. Some dry cough during visit, and sounds very congested. She is alert and oriented. Exam is limited due to virtual nature of the visit.   Assessment and Plan:  COVID-19 virus infection - supportive measures reviewed; f/u if worsening symptoms. Briefly reviewed risks/SE/benefits of paxlovid, declines  Stay well hydrated. The medications that should help your symptoms include guaifenesin (ie Mucinex), dextromethorphan (DM version of Mucinex, or separate Delsym syrup). Sinus rinses up to twice daily may be helpful. You can use decongestants if needed for sinus pain (pseudoephedrine is more effective than phenylephrine), but use with caution if causing fast heart rate, palpitations or higher blood pressure)  Follow up if you have worsening symptoms (neurologic complaints, chest pain, pain with breathing, shortness of breath, leg swelling, persistent fever, or other concerns).  Your first day of symptoms, 1/31, is day 0 Isolate days 1-5: 2/1-2/5 If on 2/6 you have no fever, and respiratory symptoms are improving, you may leave the house, but always wear a mask (days 6-10--2/6-2/10).   If you still have fever, or if cough/symptoms are NOT significantly improved, then stay in isolation for longer.  [facility] will ultimately decide when you may return to work.  I hope you feel better soon!     Follow Up Instructions:    I discussed the assessment and treatment plan with the patient. The patient was provided an opportunity to ask questions and all were answered. The patient agreed with the plan and demonstrated an understanding of the instructions.   The patient was advised to call back or seek an in-person evaluation if the symptoms worsen or if the condition fails to improve as anticipated.  I spent 22 minutes dedicated to the care of this patient, including pre-visit review of records, face to face time, post-visit ordering of testing and documentation.    Vikki Ports, MD

## 2022-09-18 NOTE — Patient Instructions (Signed)
Stay well hydrated. The medications that should help your symptoms include guaifenesin (ie Mucinex), dextromethorphan (DM version of Mucinex, or separate Delsym syrup). Sinus rinses up to twice daily may be helpful. You can use decongestants if needed for sinus pain (pseudoephedrine is more effective than phenylephrine), but use with caution if causing fast heart rate, palpitations or higher blood pressure)  Follow up if you have worsening symptoms (neurologic complaints, chest pain, pain with breathing, shortness of breath, leg swelling, persistent fever, or other concerns).  Your first day of symptoms, 1/31, is day 0 Isolate days 1-5: 2/1-2/5 If on 2/6 you have no fever, and respiratory symptoms are improving, you may leave the house, but always wear a mask (days 6-10--2/6-2/10).  If you still have fever, or if cough/symptoms are NOT significantly improved, then stay in isolation for longer.  [facility] will ultimately decide when you may return to work.  I hope you feel better soon!

## 2022-09-19 ENCOUNTER — Other Ambulatory Visit (HOSPITAL_BASED_OUTPATIENT_CLINIC_OR_DEPARTMENT_OTHER): Payer: Self-pay

## 2022-09-19 ENCOUNTER — Encounter: Payer: Self-pay | Admitting: Family Medicine

## 2022-09-19 MED ORDER — NIRMATRELVIR/RITONAVIR (PAXLOVID)TABLET
3.0000 | ORAL_TABLET | Freq: Two times a day (BID) | ORAL | 0 refills | Status: AC
  Filled 2022-09-19: qty 30, 5d supply, fill #0

## 2022-09-19 MED ORDER — NIRMATRELVIR/RITONAVIR (PAXLOVID)TABLET: 3.0000 | ORAL_TABLET | Freq: Two times a day (BID) | ORAL | 0 refills | Status: DC

## 2022-09-24 ENCOUNTER — Other Ambulatory Visit (HOSPITAL_BASED_OUTPATIENT_CLINIC_OR_DEPARTMENT_OTHER): Payer: Self-pay

## 2022-09-28 NOTE — Progress Notes (Unsigned)
Start time: End time:  Virtual Visit via Video Note  I connected with Donna Mccarthy on 09/28/22 by a video enabled telemedicine application and verified that I am speaking with the correct person using two identifiers.  Location: Patient: *** Provider: office   I discussed the limitations of evaluation and management by telemedicine and the availability of in person appointments. The patient expressed understanding and agreed to proceed.  History of Present Illness:  No chief complaint on file.  She was last seen 09/18/22 with COVID. She took Paxlovid.  Today's visit is a 4 week f/u from her January visit, to follow-up on depression/anxiety. Her PHQ-9 score was 14 and GAD-7 score was 7 at her 08/2022 visit.  She had been getting regular counseling, which was helping, but still struggling.  She reported feeling anxious, on edge, sometimes unable to get her thoughts together, fidgety.  Once she had misinterpreted something she heard, cried at her desk.  Hadn't wanted to leave her house, but had been forcing herself to.  Hadn't been back to the gym (but drove there). She recalled weight gain with citalopram many years ago.  We elected to start her on wellbutrin XL 119m for a week, then to increase to 3076mdose.  Today she reports.    PMH, PSH, SH reviewed   ROS: no fever, chills, URI symptoms, headaches, insomnia. No changes to energy, weight No chest pain, shortness of breath  ***    Observations/Objective:  LMP 09/08/2022 (Approximate)   Wt Readings from Last 3 Encounters:  09/18/22 185 lb (83.9 kg)  08/25/22 196 lb 6.4 oz (89.1 kg)  05/14/22 200 lb 3.2 oz (90.8 kg)     Assessment and Plan:  PHQ-9, GAD-7   No RF needed (given #90 of 30042m Follow Up Instructions:    I discussed the assessment and treatment plan with the patient. The patient was provided an opportunity to ask questions and all were answered. The patient agreed with the plan and  demonstrated an understanding of the instructions.   The patient was advised to call back or seek an in-person evaluation if the symptoms worsen or if the condition fails to improve as anticipated.  I spent *** minutes dedicated to the care of this patient, including pre-visit review of records, face to face time, post-visit ordering of testing and documentation.    EveVikki PortsD

## 2022-09-29 ENCOUNTER — Telehealth (INDEPENDENT_AMBULATORY_CARE_PROVIDER_SITE_OTHER): Payer: 59 | Admitting: Family Medicine

## 2022-09-29 ENCOUNTER — Encounter: Payer: Self-pay | Admitting: Family Medicine

## 2022-09-29 ENCOUNTER — Other Ambulatory Visit (HOSPITAL_BASED_OUTPATIENT_CLINIC_OR_DEPARTMENT_OTHER): Payer: Self-pay

## 2022-09-29 VITALS — BP 123/73 | HR 85 | Ht 65.0 in | Wt 183.0 lb

## 2022-09-29 DIAGNOSIS — G4700 Insomnia, unspecified: Secondary | ICD-10-CM

## 2022-09-29 DIAGNOSIS — F419 Anxiety disorder, unspecified: Secondary | ICD-10-CM | POA: Diagnosis not present

## 2022-09-29 DIAGNOSIS — F321 Major depressive disorder, single episode, moderate: Secondary | ICD-10-CM

## 2022-10-03 ENCOUNTER — Emergency Department (HOSPITAL_BASED_OUTPATIENT_CLINIC_OR_DEPARTMENT_OTHER): Payer: 59

## 2022-10-03 ENCOUNTER — Other Ambulatory Visit: Payer: Self-pay

## 2022-10-03 ENCOUNTER — Inpatient Hospital Stay (HOSPITAL_BASED_OUTPATIENT_CLINIC_OR_DEPARTMENT_OTHER)
Admission: EM | Admit: 2022-10-03 | Discharge: 2022-10-08 | DRG: 062 | Disposition: A | Discharge status: Rehabilitation Facility | Payer: 59 | Attending: Neurology | Admitting: Neurology

## 2022-10-03 ENCOUNTER — Encounter (HOSPITAL_BASED_OUTPATIENT_CLINIC_OR_DEPARTMENT_OTHER): Payer: Self-pay | Admitting: Emergency Medicine

## 2022-10-03 DIAGNOSIS — M79604 Pain in right leg: Secondary | ICD-10-CM | POA: Diagnosis not present

## 2022-10-03 DIAGNOSIS — D709 Neutropenia, unspecified: Secondary | ICD-10-CM | POA: Diagnosis not present

## 2022-10-03 DIAGNOSIS — K219 Gastro-esophageal reflux disease without esophagitis: Secondary | ICD-10-CM | POA: Diagnosis present

## 2022-10-03 DIAGNOSIS — F4001 Agoraphobia with panic disorder: Secondary | ICD-10-CM | POA: Diagnosis not present

## 2022-10-03 DIAGNOSIS — G43909 Migraine, unspecified, not intractable, without status migrainosus: Secondary | ICD-10-CM | POA: Diagnosis present

## 2022-10-03 DIAGNOSIS — Z880 Allergy status to penicillin: Secondary | ICD-10-CM

## 2022-10-03 DIAGNOSIS — I959 Hypotension, unspecified: Secondary | ICD-10-CM | POA: Diagnosis not present

## 2022-10-03 DIAGNOSIS — I10 Essential (primary) hypertension: Secondary | ICD-10-CM | POA: Diagnosis present

## 2022-10-03 DIAGNOSIS — I6381 Other cerebral infarction due to occlusion or stenosis of small artery: Secondary | ICD-10-CM | POA: Diagnosis not present

## 2022-10-03 DIAGNOSIS — Z833 Family history of diabetes mellitus: Secondary | ICD-10-CM

## 2022-10-03 DIAGNOSIS — Z800 Family history of malignant neoplasm of digestive organs: Secondary | ICD-10-CM

## 2022-10-03 DIAGNOSIS — Z886 Allergy status to analgesic agent status: Secondary | ICD-10-CM

## 2022-10-03 DIAGNOSIS — E669 Obesity, unspecified: Secondary | ICD-10-CM | POA: Diagnosis not present

## 2022-10-03 DIAGNOSIS — Z6831 Body mass index (BMI) 31.0-31.9, adult: Secondary | ICD-10-CM | POA: Diagnosis not present

## 2022-10-03 DIAGNOSIS — G8191 Hemiplegia, unspecified affecting right dominant side: Secondary | ICD-10-CM | POA: Diagnosis not present

## 2022-10-03 DIAGNOSIS — R29707 NIHSS score 7: Secondary | ICD-10-CM | POA: Diagnosis not present

## 2022-10-03 DIAGNOSIS — E785 Hyperlipidemia, unspecified: Secondary | ICD-10-CM | POA: Diagnosis present

## 2022-10-03 DIAGNOSIS — Z83719 Family history of colon polyps, unspecified: Secondary | ICD-10-CM | POA: Diagnosis not present

## 2022-10-03 DIAGNOSIS — Z83438 Family history of other disorder of lipoprotein metabolism and other lipidemia: Secondary | ICD-10-CM

## 2022-10-03 DIAGNOSIS — I639 Cerebral infarction, unspecified: Secondary | ICD-10-CM | POA: Diagnosis not present

## 2022-10-03 DIAGNOSIS — Z888 Allergy status to other drugs, medicaments and biological substances status: Secondary | ICD-10-CM

## 2022-10-03 DIAGNOSIS — G43919 Migraine, unspecified, intractable, without status migrainosus: Secondary | ICD-10-CM | POA: Diagnosis not present

## 2022-10-03 DIAGNOSIS — E876 Hypokalemia: Secondary | ICD-10-CM | POA: Diagnosis present

## 2022-10-03 DIAGNOSIS — F411 Generalized anxiety disorder: Secondary | ICD-10-CM | POA: Diagnosis not present

## 2022-10-03 DIAGNOSIS — Z8249 Family history of ischemic heart disease and other diseases of the circulatory system: Secondary | ICD-10-CM

## 2022-10-03 DIAGNOSIS — R2990 Unspecified symptoms and signs involving the nervous system: Secondary | ICD-10-CM

## 2022-10-03 DIAGNOSIS — H5120 Internuclear ophthalmoplegia, unspecified eye: Secondary | ICD-10-CM

## 2022-10-03 DIAGNOSIS — R471 Dysarthria and anarthria: Secondary | ICD-10-CM | POA: Diagnosis not present

## 2022-10-03 DIAGNOSIS — Z79899 Other long term (current) drug therapy: Secondary | ICD-10-CM

## 2022-10-03 DIAGNOSIS — Z823 Family history of stroke: Secondary | ICD-10-CM

## 2022-10-03 DIAGNOSIS — H4923 Sixth [abducent] nerve palsy, bilateral: Secondary | ICD-10-CM | POA: Diagnosis present

## 2022-10-03 DIAGNOSIS — F419 Anxiety disorder, unspecified: Secondary | ICD-10-CM | POA: Diagnosis not present

## 2022-10-03 DIAGNOSIS — I6302 Cerebral infarction due to thrombosis of basilar artery: Secondary | ICD-10-CM | POA: Diagnosis not present

## 2022-10-03 DIAGNOSIS — I081 Rheumatic disorders of both mitral and tricuspid valves: Secondary | ICD-10-CM | POA: Diagnosis not present

## 2022-10-03 DIAGNOSIS — I69351 Hemiplegia and hemiparesis following cerebral infarction affecting right dominant side: Secondary | ICD-10-CM | POA: Diagnosis not present

## 2022-10-03 DIAGNOSIS — E039 Hypothyroidism, unspecified: Secondary | ICD-10-CM | POA: Diagnosis present

## 2022-10-03 DIAGNOSIS — R4781 Slurred speech: Secondary | ICD-10-CM | POA: Diagnosis present

## 2022-10-03 DIAGNOSIS — G43809 Other migraine, not intractable, without status migrainosus: Secondary | ICD-10-CM | POA: Diagnosis not present

## 2022-10-03 DIAGNOSIS — Z803 Family history of malignant neoplasm of breast: Secondary | ICD-10-CM

## 2022-10-03 DIAGNOSIS — Z8673 Personal history of transient ischemic attack (TIA), and cerebral infarction without residual deficits: Secondary | ICD-10-CM | POA: Diagnosis not present

## 2022-10-03 DIAGNOSIS — Z8616 Personal history of COVID-19: Secondary | ICD-10-CM

## 2022-10-03 DIAGNOSIS — Z7985 Long-term (current) use of injectable non-insulin antidiabetic drugs: Secondary | ICD-10-CM

## 2022-10-03 DIAGNOSIS — K5900 Constipation, unspecified: Secondary | ICD-10-CM | POA: Diagnosis not present

## 2022-10-03 DIAGNOSIS — R29810 Facial weakness: Secondary | ICD-10-CM | POA: Diagnosis not present

## 2022-10-03 DIAGNOSIS — R4701 Aphasia: Secondary | ICD-10-CM | POA: Diagnosis present

## 2022-10-03 DIAGNOSIS — I6389 Other cerebral infarction: Secondary | ICD-10-CM | POA: Diagnosis not present

## 2022-10-03 DIAGNOSIS — R531 Weakness: Secondary | ICD-10-CM | POA: Diagnosis not present

## 2022-10-03 DIAGNOSIS — M79661 Pain in right lower leg: Secondary | ICD-10-CM | POA: Diagnosis not present

## 2022-10-03 DIAGNOSIS — R29818 Other symptoms and signs involving the nervous system: Secondary | ICD-10-CM | POA: Diagnosis not present

## 2022-10-03 DIAGNOSIS — I950 Idiopathic hypotension: Secondary | ICD-10-CM | POA: Diagnosis not present

## 2022-10-03 LAB — CBC
HCT: 36.3 % (ref 36.0–46.0)
Hemoglobin: 11.5 g/dL — ABNORMAL LOW (ref 12.0–15.0)
MCH: 26.4 pg (ref 26.0–34.0)
MCHC: 31.7 g/dL (ref 30.0–36.0)
MCV: 83.3 fL (ref 80.0–100.0)
Platelets: 241 10*3/uL (ref 150–400)
RBC: 4.36 MIL/uL (ref 3.87–5.11)
RDW: 14.1 % (ref 11.5–15.5)
WBC: 5.6 10*3/uL (ref 4.0–10.5)
nRBC: 0 % (ref 0.0–0.2)

## 2022-10-03 LAB — DIFFERENTIAL
Abs Immature Granulocytes: 0 10*3/uL (ref 0.00–0.07)
Basophils Absolute: 0 10*3/uL (ref 0.0–0.1)
Basophils Relative: 1 %
Eosinophils Absolute: 0.1 10*3/uL (ref 0.0–0.5)
Eosinophils Relative: 2 %
Immature Granulocytes: 0 %
Lymphocytes Relative: 53 %
Lymphs Abs: 3 10*3/uL (ref 0.7–4.0)
Monocytes Absolute: 0.5 10*3/uL (ref 0.1–1.0)
Monocytes Relative: 10 %
Neutro Abs: 1.9 10*3/uL (ref 1.7–7.7)
Neutrophils Relative %: 34 %

## 2022-10-03 LAB — COMPREHENSIVE METABOLIC PANEL
ALT: 13 U/L (ref 0–44)
AST: 23 U/L (ref 15–41)
Albumin: 4.2 g/dL (ref 3.5–5.0)
Alkaline Phosphatase: 69 U/L (ref 38–126)
Anion gap: 6 (ref 5–15)
BUN: 12 mg/dL (ref 6–20)
CO2: 22 mmol/L (ref 22–32)
Calcium: 8.9 mg/dL (ref 8.9–10.3)
Chloride: 109 mmol/L (ref 98–111)
Creatinine, Ser: 0.66 mg/dL (ref 0.44–1.00)
GFR, Estimated: >60 mL/min (ref >60)
Glucose, Bld: 94 mg/dL (ref 70–99)
Potassium: 3.8 mmol/L (ref 3.5–5.1)
Sodium: 137 mmol/L (ref 135–145)
Total Bilirubin: 0.6 mg/dL (ref 0.3–1.2)
Total Protein: 8 g/dL (ref 6.5–8.1)

## 2022-10-03 LAB — PROTIME-INR
INR: 1.1 (ref 0.8–1.2)
Prothrombin Time: 14.1 seconds (ref 11.4–15.2)

## 2022-10-03 LAB — APTT: aPTT: 25 seconds (ref 24–36)

## 2022-10-03 LAB — CBG MONITORING, ED: Glucose-Capillary: 87 mg/dL (ref 70–99)

## 2022-10-03 LAB — ETHANOL: Alcohol, Ethyl (B): <10 mg/dL (ref <10)

## 2022-10-03 LAB — PREGNANCY, URINE: Preg Test, Ur: NEGATIVE

## 2022-10-03 MED ORDER — SENNOSIDES-DOCUSATE SODIUM 8.6-50 MG PO TABS: 1.0000 | ORAL_TABLET | Freq: Every evening | ORAL | Status: DC | PRN

## 2022-10-03 MED ORDER — SODIUM CHLORIDE 0.9 % IV SOLN: INTRAVENOUS | Status: DC

## 2022-10-03 MED ORDER — ACETAMINOPHEN 325 MG PO TABS
650.0000 mg | ORAL_TABLET | ORAL | Status: DC | PRN
  Administered 2022-10-05 – 2022-10-08 (×4): 650 mg via ORAL
  Filled 2022-10-03 (×4): qty 2

## 2022-10-03 MED ORDER — CHLORHEXIDINE GLUCONATE CLOTH 2 % EX PADS
6.0000 | MEDICATED_PAD | Freq: Every day | CUTANEOUS | Status: DC
  Administered 2022-10-04 – 2022-10-08 (×5): 6 via TOPICAL

## 2022-10-03 MED ORDER — ACETAMINOPHEN 160 MG/5ML PO SOLN: 650.0000 mg | ORAL | Status: DC | PRN

## 2022-10-03 MED ORDER — LABETALOL HCL 5 MG/ML IV SOLN: 20.0000 mg | Freq: Once | INTRAVENOUS | Status: DC

## 2022-10-03 MED ORDER — IOHEXOL 350 MG/ML SOLN
75.0000 mL | Freq: Once | INTRAVENOUS | Status: AC | PRN
  Administered 2022-10-03: 75 mL via INTRAVENOUS

## 2022-10-03 MED ORDER — CLEVIDIPINE BUTYRATE 0.5 MG/ML IV EMUL: 0.0000 mg/h | INTRAVENOUS | Status: DC

## 2022-10-03 MED ORDER — STROKE: EARLY STAGES OF RECOVERY BOOK
Freq: Once | Status: AC
  Filled 2022-10-03: qty 1

## 2022-10-03 MED ORDER — ACETAMINOPHEN 650 MG RE SUPP: 650.0000 mg | RECTAL | Status: DC | PRN

## 2022-10-03 MED ORDER — TENECTEPLASE FOR STROKE: 0.2500 mg/kg | PACK | Freq: Once | INTRAVENOUS | Status: AC

## 2022-10-03 MED ORDER — ORAL CARE MOUTH RINSE: 15.0000 mL | OROMUCOSAL | Status: DC | PRN

## 2022-10-03 MED ORDER — TENECTEPLASE FOR STROKE
PACK | INTRAVENOUS | Status: AC
  Administered 2022-10-03: 21 mg via INTRAVENOUS
  Filled 2022-10-03: qty 10

## 2022-10-03 MED ORDER — PANTOPRAZOLE SODIUM 40 MG IV SOLR
40.0000 mg | Freq: Every day | INTRAVENOUS | Status: DC
  Administered 2022-10-03: 40 mg via INTRAVENOUS
  Filled 2022-10-03: qty 10

## 2022-10-03 MED ORDER — SODIUM CHLORIDE 0.9% FLUSH
3.0000 mL | Freq: Once | INTRAVENOUS | Status: AC
  Administered 2022-10-03: 3 mL via INTRAVENOUS
  Filled 2022-10-03: qty 3

## 2022-10-03 NOTE — ED Notes (Signed)
Patient transported to CT 

## 2022-10-03 NOTE — ED Provider Notes (Signed)
[facility] [facility] [facility] Provider Note   CSN: [id number] Arrival date & time: 10/03/22  1845     History  Chief Complaint  Patient presents with   Facial Droop   Aphasia    Donna Mccarthy is a 47 y.o. female.  Patient here with difficulty speaking, facial droop.  She states that she drove herself here but overall is difficult to understand the patient as she slurs a lot of her words/mumbles.  She has no history of stroke.  History of anxiety and depression.  History of acid reflux.  She is tearful on exam.  History is somewhat limited due to her having difficulty with speech and being tearful.  She denies headache.  She denies any pain.  The history is provided by the patient.       Home Medications Prior to Admission medications   Medication Sig Start Date End Date Taking? Authorizing Provider  ALPRAZolam (XANAX) 0.25 MG tablet Take 1 tablet (0.25 mg total) by mouth 3 (three) times daily as needed for anxiety. Patient not taking: Reported on 09/18/2022 12/25/21   Rita Ohara, MD  aspirin-acetaminophen-caffeine [facility] MIGRAINE) [phone] MG tablet Take 2 tablets by mouth every 6 (six) hours as needed for headache. Patient not taking: Reported on 05/14/2022    [provider]  buPROPion (WELLBUTRIN XL) 300 MG 24 hr tablet Take 1 tablet (300 mg total) by mouth daily. 09/08/22   Rita Ohara, MD  levonorgestrel (MIRENA) 20 MCG/24HR IUD 1 each by Intrauterine route once.    [provider]  loratadine (CLARITIN) 10 MG tablet Take 10 mg by mouth daily. Patient not taking: Reported on 09/29/2022    [provider]  Magnesium 250 MG TABS Take by mouth.    [provider]  melatonin 3 MG TABS tablet Take 2 tablet by mouth at bedtime.    [provider]  naproxen (NAPROSYN) 500 MG tablet Take 1,500 mg by mouth 2 (two) times daily with a meal.    [provider]  NON FORMULARY Take 1 capsule by mouth  daily. Patient not taking: Reported on 09/29/2022    [provider]  norethindrone (AYGESTIN) 5 MG tablet Take 2 tablets by mouth once daily 09/23/21     Semaglutide-Weight Management (WEGOVY) 0.25 MG/0.5ML SOAJ Inject 0.25 mg into the skin once a week. Patient not taking: Reported on 08/25/2022 06/05/22     Semaglutide-Weight Management (WEGOVY) 0.25 MG/0.5ML SOAJ Inject 0.25 mg into the skin once a week. Patient not taking: Reported on 08/25/2022 06/09/22     Semaglutide-Weight Management (WEGOVY) 0.5 MG/0.5ML SOAJ Inject 0.5 mg into the skin once a week. Patient not taking: Reported on 08/25/2022 07/07/22     SUMAtriptan (IMITREX) 100 MG tablet Take 1 tablet (100 mg total) by mouth once for 1 dose. May repeat in 2 hours if headache persists or recurs. Patient not taking: Reported on 05/14/2022 03/10/22 09/12/22  Denita Lung, MD  thyroid (NP THYROID) 120 MG tablet Take 1 tablet (120 mg total) by mouth daily on an empty stomach. 06/05/22     thyroid (NP THYROID) 60 MG tablet Take 1 tablet by mouth on an empty stomach once a day 06/05/22     topiramate (TOPAMAX) 25 MG tablet Take 3 tablets (75 mg total) by mouth at bedtime. 05/14/22 05/14/23  Rita Ohara, MD  Vitamin D, Ergocalciferol, (DRISDOL) 1.25 MG (50000 UNIT) CAPS capsule Take 1 capsule (50,000 Units total) by mouth every 7 (seven)  days. 08/26/22   Rita Ohara, MD      Allergies    Ketorolac tromethamine, Penicillins, Celebrex [celecoxib], Ketorolac, Other, and Penicillin g    Review of Systems   Review of Systems  Physical Exam Updated Vital Signs BP 136/83   Pulse 84   Temp 98 F (36.7 C)   Resp 20   Wt 85.2 kg   LMP 09/22/2022 (Approximate)   SpO2 100%   BMI 31.25 kg/m  Physical Exam Vitals and nursing note reviewed.  Constitutional:      General: She is not in acute distress.    Appearance: She is well-developed.  HENT:     Head: Normocephalic and atraumatic.     Nose: Nose normal.     Mouth/Throat:     Mouth:  Mucous membranes are moist.  Eyes:     Extraocular Movements: Extraocular movements intact.     Conjunctiva/sclera: Conjunctivae normal.     Pupils: Pupils are equal, round, and reactive to light.  Cardiovascular:     Rate and Rhythm: Normal rate and regular rhythm.     Pulses: Normal pulses.     Heart sounds: Normal heart sounds. No murmur heard. Pulmonary:     Effort: Pulmonary effort is normal. No respiratory distress.     Breath sounds: Normal breath sounds.  Abdominal:     Palpations: Abdomen is soft.     Tenderness: There is no abdominal tenderness.  Musculoskeletal:        General: No swelling.     Cervical back: Neck supple.  Skin:    General: Skin is warm and dry.     Capillary Refill: Capillary refill takes less than 2 seconds.  Neurological:     Mental Status: She is alert.     Comments: Cranial nerves overall appear to be grossly intact however maybe there is a facial droop, does not really give me a great effort with any extremity testing but may be the right side little bit weaker than the left but overall not getting a lot of effort on strength exam.  She says she has decreased sensation in the right leg compared to the left and normal sensation upper extremities, she does not have any neglect, she has somewhat mumbled speech may be slurred speech, visual fields appear to be intact, extraocular motions appear to be intact however on reexamination she is having some difficulty with extraocular movements now, she appears to have issues with lateral gaze in both eyes at times  Psychiatric:        Mood and Affect: Mood normal.     Comments: Tearful     ED Results / Procedures / Treatments   Labs (all labs ordered are listed, but only abnormal results are displayed) Labs Reviewed  CBC - Abnormal; Notable for the following components:      Result Value   Hemoglobin 11.5 (*)    All other components within normal limits  PROTIME-INR  APTT  DIFFERENTIAL  COMPREHENSIVE  METABOLIC PANEL  ETHANOL  PREGNANCY, URINE  CBG MONITORING, ED    EKG EKG Interpretation  Date/Time:  Friday October 03 2022 18:51:47 EST Ventricular Rate:  94 PR Interval:  164 QRS Duration: 93 QT Interval:  373 QTC Calculation: 467 R Axis:   14 Text Interpretation: Sinus rhythm LAE, consider biatrial enlargement Low voltage, precordial leads Probable anteroseptal infarct, old Confirmed by Ronnald Nian, Izaan Kingbird [phone]) on 10/03/2022 6:54:36 PM  Radiology CT ANGIO HEAD NECK W WO CM (CODE STROKE)  Result  Date: 10/03/2022 CLINICAL DATA:  Stroke-like symptoms EXAM: CT ANGIOGRAPHY HEAD AND NECK TECHNIQUE: Multidetector CT imaging of the head and neck was performed using the standard protocol during bolus administration of intravenous contrast. Multiplanar CT image reconstructions and MIPs were obtained to evaluate the vascular anatomy. Carotid stenosis measurements (when applicable) are obtained utilizing NASCET criteria, using the distal internal carotid diameter as the denominator. RADIATION DOSE REDUCTION: This exam was performed according to the departmental dose-optimization program which includes automated exposure control, adjustment of the mA and/or kV according to patient size and/or use of iterative reconstruction technique. CONTRAST:  59m OMNIPAQUE IOHEXOL 350 MG/ML SOLN COMPARISON:  No prior CTA available, correlation is made with CT head 10/03/2022 FINDINGS: CT HEAD FINDINGS For noncontrast findings, please see same day CT head. CTA NECK FINDINGS Aortic arch: Standard branching. Imaged portion shows no evidence of aneurysm or dissection. No significant stenosis of the major arch vessel origins. Right carotid system: No evidence of stenosis, dissection, or occlusion. Left carotid system: No evidence of stenosis, dissection, or occlusion. Vertebral arteries: No evidence of stenosis, dissection, or occlusion. Skeleton: No acute osseous abnormality. Other neck: No acute finding. Upper chest: No  focal pulmonary opacity or pleural effusion. Review of the MIP images confirms the above findings CTA HEAD FINDINGS Evaluation is somewhat limited by bolus timing. Anterior circulation: Both internal carotid arteries are patent to the termini, without significant stenosis. A1 segments patent. Normal anterior communicating artery. Anterior cerebral arteries are patent to their distal aspects. The bilateral MCAs appear somewhat narrow, without evidence of downstream dilatation. No focal M1 stenosis or occlusion. MCA branches perfused and symmetric. Posterior circulation: Vertebral arteries patent to the vertebrobasilar junction without stenosis. Posterior inferior cerebellar arteries not well seen. Basilar patent to its distal aspect. Superior cerebellar arteries patent proximally. Patent P1 segments. The bilateral PCAs appear somewhat narrow, without evidence of downstream dilatation, but appear to be perfused proximally. Evaluation of the distal PCAs is limited by venous contamination. Venous sinuses: Patent. Anatomic variants: None significant. Review of the MIP images confirms the above findings IMPRESSION: 1. No large vessel occlusion or significant stenosis in the neck. 2. The bilateral MCAs and PCAs appear somewhat narrow, without evidence of downstream dilatation, but appear to be perfused proximally. This could be secondary to bolus timing, but a vasculitis or reversible cerebral vasoconstriction syndrome (RCVS) is not excluded. Electronically Signed   By: AMerilyn BabaM.D.   On: 10/03/2022 20:05   CT HEAD CODE STROKE WO CONTRAST  Result Date: 10/03/2022 CLINICAL DATA:  Code stroke. Neuro deficit, acute, stroke suspected. Left-sided facial droop. Right-sided weakness. EXAM: CT HEAD WITHOUT CONTRAST TECHNIQUE: Contiguous axial images were obtained from the base of the skull through the vertex without intravenous contrast. RADIATION DOSE REDUCTION: This exam was performed according to the departmental  dose-optimization program which includes automated exposure control, adjustment of the mA and/or kV according to patient size and/or use of iterative reconstruction technique. COMPARISON:  Brain MRI 05/18/2007.  Head CT 05/18/2007. FINDINGS: Brain: Cerebral volume is normal. There is no acute intracranial hemorrhage. No demarcated cortical infarct. No extra-axial fluid collection. No evidence of an intracranial mass. No midline shift. Vascular: No hyperdense vessel. Skull: No fracture or aggressive osseous lesion. Sinuses/Orbits: No mass or acute finding within the imaged orbits. No significant paranasal sinus disease at the imaged levels. ASPECTS ([facility]Stroke Program Early CT Score) - Ganglionic level infarction (caudate, lentiform nuclei, internal capsule, insula, M1-M3 cortex): 7 - Supraganglionic infarction (M4-M6 cortex): 3 Total score (  0-10 with 10 being normal): 10 No evidence of an acute intracranial abnormality. These results were called by telephone at the time of interpretation on 10/03/2022 at 7:03 pm to provider Arshiya Jakes , who verbally acknowledged these results. IMPRESSION: No evidence of an acute intracranial abnormality. ASPECTS is 10. Electronically Signed   By: Kellie Simmering D.O.   On: 10/03/2022 19:04    Procedures .Critical Care  Performed by: Lennice Sites, DO Authorized by: Lennice Sites, DO   Critical care provider statement:    Critical care time (minutes):  35   Critical care was necessary to treat or prevent imminent or life-threatening deterioration of the following conditions:  CNS failure or compromise (stroke)   Critical care was time spent personally by me on the following activities:  Blood draw for specimens, development of treatment plan with patient or surrogate, discussions with consultants, evaluation of patient's response to treatment, examination of patient, obtaining history from patient or surrogate, ordering and review of laboratory studies, ordering and  performing treatments and interventions, ordering and review of radiographic studies, pulse oximetry, re-evaluation of patient's condition and review of old charts     Medications Ordered in ED Medications  sodium chloride flush (NS) 0.9 % injection 3 mL (has no administration in time range)  labetalol (NORMODYNE) injection 20 mg (0 mg Intravenous Hold 10/03/22 1944)    And  clevidipine (CLEVIPREX) infusion 0.5 mg/mL (0 mg/hr Intravenous Hold 10/03/22 1944)  tenecteplase (TNKASE) injection for Stroke 21 mg (21 mg Intravenous Given 10/03/22 1923)  iohexol (OMNIPAQUE) 350 MG/ML injection 75 mL (75 mLs Intravenous Contrast Given 10/03/22 1936)    ED Course/ Medical Decision Making/ A&P                             Medical Decision Making Amount and/or Complexity of Data Reviewed Labs: ordered. Radiology: ordered.  Risk Prescription drug management. Decision regarding hospitalization.   Donna Mccarthy is here with strokelike symptoms.  Normal vitals.  No fever.  About 10 minutes prior to arrival she developed facial droop, difficulty with speech.  She has a history of anxiety and depression.  No real stroke risk factors.  Patient works at [employer].  She has numbness in the right lower leg, weakness in the right lower leg and right upper arm, may be right-sided facial droop, slurred speech.  She is very tearful.  Very difficult to tell if this is a functional exam or not.  Her extraocular movements when I first evaluated her appear to be normal.  She did not have any neglect.  No clear aphasia.  No visual field deficit.  However on examination with the neurologist it appeared that she was having some lateral gaze palsies and Ino.  Overall clinical picture was very difficult and she fit within the window for TNK.  She appeared to have some symptoms that appeared to be organic and after shared decision with the patient decision was made to give the patient TNK after she had negative head CT.   CT angio of the head and neck was unremarkable.  Blood work per my review and interpretation is unremarkable.  Dr. Quinn Axe with neurology recommended TNK which was given and blood pressure control was initiated.  Patient to be admitted to the neurological ICU with Dr. Raliegh Ip.  This chart was dictated using voice recognition software.  Despite best efforts to proofread,  errors can occur which can change the documentation meaning.  Final Clinical Impression(s) / ED Diagnoses Final diagnoses:  Cerebrovascular accident (CVA), unspecified mechanism [facility])    Rx / [place] Orders ED Discharge Orders     None         Lennice Sites, DO 10/03/22 2016

## 2022-10-03 NOTE — Progress Notes (Signed)
Elert 1858 - Pt back from CT at time of elert   Dr. Quinn Axe paged 1900 Dr. Quinn Axe on camera 1905  mRS 0 Symptom onset just PTA as pt states that she was driving when she felt her face start to droop. Pt now has L facial droop, slurred speech, and abnormal eye movements.  TNK bolus 1923

## 2022-10-03 NOTE — ED Notes (Signed)
[facility] called for transport. 

## 2022-10-03 NOTE — Consult Note (Signed)
NEUROLOGY TELECONSULTATION NOTE   Date of service: October 03, 2022 Patient Name: Donna Mccarthy MRN:  [id number] DOB:  [birth year] Reason for consult: telestroke  Requesting Provider: Dr. Lennice Sites Consult Participants: myself, bedside RN, telestroke RN, patient Location of the provider: [place], [place] Location of the patient: [facility]  This consult was provided via telemedicine with 2-way video and audio communication. The patient/family was informed that care would be provided in this way and agreed to receive care in this manner.   _ _ _   _ __   _ __ _ _  __ __   _ __   __ _  History of Present Illness   This is a 47 yo woman with history of anxiety, depression, hypothyroidism, and recent covid infection presents with facial droop and dysarthria. Patient reports she felt her face drooping when she was driving and rerouted and drove straight to the ED. ED arrival time 52, patient states LKW was after 1830. On examination she initially had a L lower facial droop that was severe but variable in severity and severe dysarthria with almost unintelligible speech. She gave very poor effort on strength testing but with coaching was able to hold all extremities up without drift. She had normal eye movements in triage but on my initial examination she was unable to adduct her L eye when looking to the R or adduct her R eye when looking to the L (bilateral INO). She was later able to adduct on the R only (L INO). The eye movements were also poorly reproducible. CT head showed no acute intracranial process and was personally reviewed prior to TNK administration. Given the fact that she had acute onset neurologic sx, some of which appeared functional but some of which appeared organic the decision was made to offer TNK. Risks, benefits, and alternatives to TNK were discussed with the patient and she gave informed consent to proceed. CTA showed no LVO and was overall unremarkable. All CNS imaging  personally reviewed.   ROS   Per HPI; all other systems reviewed and are negative  Past History   The following was personally reviewed:  Past Medical History:  Diagnosis Date   Allergy    Anemia    Anxiety    Arthritis    arthritis knees   Blood transfusion without reported diagnosis    GERD (gastroesophageal reflux disease)    Hypothyroidism    Neuromuscular disorder (HCC)    Obesity (BMI 30-39.9) 01/05/2019   Past Surgical History:  Procedure Laterality Date   BIOPSY  04/03/2020   Procedure: BIOPSY;  Surgeon: Harvel Quale, MD;  [facility];  Service: Gastroenterology;;  antral, body, distal esophagus, mid esophagus   BRACHIOPLASTY Bilateral 07/02/2020   Procedure: BILATERAL BRACHIOPLASTY WITH LIPOSUCTION;  Surgeon: [name], MD;  Location: [place];  Service: Plastics;  Laterality: Bilateral;   DILATION AND CURETTAGE OF UTERUS     ESOPHAGOGASTRODUODENOSCOPY (EGD) WITH PROPOFOL N/A 04/03/2020   Procedure: ESOPHAGOGASTRODUODENOSCOPY (EGD) WITH PROPOFOL;  Surgeon: Harvel Quale, MD;  [facility]: [facility];  Service: Gastroenterology;  Laterality: N/A;  11:15   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   LIPOSUCTION Bilateral 07/02/2020   Procedure: LIPOSUCTION;  Surgeon: [name], MD;  Location: [place];  Service: Plastics;  Laterality: Bilateral;   PANNICULECTOMY N/A 08/04/2019   Procedure: PANNICULECTOMY;  Surgeon: [name], MD;  Location: [place];  Service: Plastics;  Laterality: N/A;  2 hours only, please   Family History  Problem Relation Age of Onset   Hypertension Mother    Crohn's disease Mother    Colon polyps Mother    Hypertension Father        POTS   Stroke Father        TIA   Hyperlipidemia Father    Hypertension Brother    Breast cancer Maternal Grandmother    Diabetes Maternal Grandmother    Heart failure Maternal Grandmother    Thyroid  disease Paternal Grandmother    Vitamin D deficiency Paternal Grandmother    Heart failure Paternal Grandfather    Heart attack Paternal Grandfather    Colon cancer Maternal Uncle        dx'd in 10's   Colon cancer Maternal Uncle 58   Breast cancer Maternal Aunt    Social History   Socioeconomic History   Marital status: Single    Spouse name: Not on file   Number of children: 1   Years of education: 14   Highest education level: Futures trader degree: occupational, Hotel manager, or vocational program  Occupational History   Occupation: cma    Employer: [employer]  Tobacco Use   Smoking status: Never   Smokeless tobacco: Never  Vaping Use   Vaping Use: Never used  Substance and Sexual Activity   Alcohol use: No   Drug use: No   Sexual activity: Yes    Birth control/protection: I.U.D.  Other Topics Concern   Not on file  Social History Narrative   Lives alone.   Son [name] lives in [place], [place], girlfriend is pregnant.  Graduates from school on 07/2022.   Works in [employer] pool      Updated 04/2022   Social Determinants of Health   Financial Resource Strain: Not on file  Food Insecurity: Not on file  Transportation Needs: Not on file  Physical Activity: Not on file  Stress: Not on file  Social Connections: Not on file   Allergies  Allergen Reactions   Ketorolac Tromethamine Other (See Comments)    "felt like whole body was on fire"   Penicillins Other (See Comments)    Mother said she had a rash   Celebrex [Celecoxib]     itching   Ketorolac Other (See Comments)   Other Other (See Comments)   Penicillin G Other (See Comments)    Medications   (Not in a hospital admission)     Current Facility-Administered Medications:    tenecteplase (TNKASE) 50 MG injection for Stroke, , , ,    sodium chloride flush (NS) 0.9 % injection 3 mL, 3 mL, Intravenous, Once, Curatolo, Adam, DO  Current Outpatient Medications:    ALPRAZolam (XANAX) 0.25 MG tablet, Take 1 tablet  (0.25 mg total) by mouth 3 (three) times daily as needed for anxiety. (Patient not taking: Reported on 09/18/2022), Disp: 10 tablet, Rfl: 0   aspirin-acetaminophen-caffeine (EXCEDRIN MIGRAINE) 250-250-65 MG tablet, Take 2 tablets by mouth every 6 (six) hours as needed for headache. (Patient not taking: Reported on 05/14/2022), Disp: , Rfl:    buPROPion (WELLBUTRIN XL) 300 MG 24 hr tablet, Take 1 tablet (300 mg total) by mouth daily., Disp: 90 tablet, Rfl: 0   levonorgestrel (MIRENA) 20 MCG/24HR IUD, 1 each by Intrauterine route once., Disp: , Rfl:    loratadine (CLARITIN) 10 MG tablet, Take 10 mg by mouth daily. (Patient not taking: Reported on 09/29/2022), Disp: , Rfl:    Magnesium 250 MG  TABS, Take by mouth., Disp: , Rfl:    melatonin 3 MG TABS tablet, Take 2 tablet by mouth at bedtime., Disp: , Rfl:    naproxen (NAPROSYN) 500 MG tablet, Take 1,500 mg by mouth 2 (two) times daily with a meal., Disp: , Rfl:    NON FORMULARY, Take 1 capsule by mouth daily. (Patient not taking: Reported on 09/29/2022), Disp: , Rfl:    norethindrone (AYGESTIN) 5 MG tablet, Take 2 tablets by mouth once daily, Disp: 30 tablet, Rfl: 1   Semaglutide-Weight Management (WEGOVY) 0.25 MG/0.5ML SOAJ, Inject 0.25 mg into the skin once a week. (Patient not taking: Reported on 08/25/2022), Disp: 2 mL, Rfl: 5   Semaglutide-Weight Management (WEGOVY) 0.25 MG/0.5ML SOAJ, Inject 0.25 mg into the skin once a week. (Patient not taking: Reported on 08/25/2022), Disp: 2 mL, Rfl: 5   Semaglutide-Weight Management (WEGOVY) 0.5 MG/0.5ML SOAJ, Inject 0.5 mg into the skin once a week. (Patient not taking: Reported on 08/25/2022), Disp: 4 mL, Rfl: 5   SUMAtriptan (IMITREX) 100 MG tablet, Take 1 tablet (100 mg total) by mouth once for 1 dose. May repeat in 2 hours if headache persists or recurs. (Patient not taking: Reported on 05/14/2022), Disp: 10 tablet, Rfl: 1   thyroid (NP THYROID) 120 MG tablet, Take 1 tablet (120 mg total) by mouth daily on an empty  stomach., Disp: 30 tablet, Rfl: 5   thyroid (NP THYROID) 60 MG tablet, Take 1 tablet by mouth on an empty stomach once a day, Disp: 30 tablet, Rfl: 5   topiramate (TOPAMAX) 25 MG tablet, Take 3 tablets (75 mg total) by mouth at bedtime., Disp: 270 tablet, Rfl: 3   Vitamin D, Ergocalciferol, (DRISDOL) 1.25 MG (50000 UNIT) CAPS capsule, Take 1 capsule (50,000 Units total) by mouth every 7 (seven) days., Disp: 12 capsule, Rfl: 0  Vitals   Vitals:   10/03/22 1845 10/03/22 1850 10/03/22 1858  BP: (!) 148/101    Pulse: (!) 101 99   Resp:  16   SpO2: 100% 100%   Weight:   85.2 kg     Body mass index is 31.25 kg/m.  Physical Exam   Exam performed over telemedicine with 2-way video and audio communication and with assistance of bedside RN  Physical Exam Gen: A&O x4, NAD Resp: normal WOB CV: extremities appear well-perfused  Neuro: *MS: A&O x4. Follows multi-step commands.  *Speech: severe dysarthria, no aphasia *CN: PERRL 44m, on my initial examination she was unable to adduct her L eye when looking to the R or adduct her R eye when looking to the L (bilateral INO). She was later able to adduct on the R only (L INO), VFF by confrontation, sensation intact, L UMN facial droop at rest is severe but variable, asymmetric on smiling with inconsistent appearance, hearing intact to voice *Motor:   Normal bulk.  No tremor, rigidity or bradykinesia. Highly effort-dependent but able to hold all extremities without drift with coaching *Sensory: SILT. Symmetric. No double-simultaneous extinction.  *Coordination:  FNF intact bilat *Reflexes:  UTA 2/2 tele-exam *Gait: deferred  NIHSS  1a Level of Conscious.: 0 1b LOC Questions: 0 1c LOC Commands: 0 2 Best Gaze: 2 3 Visual: 0 4 Facial Palsy: 2 5a Motor Arm - left: 0 5b Motor Arm - Right: 0 6a Motor Leg - Left: 0 6b Motor Leg - Right: 0 7 Limb Ataxia: 0 8 Sensory: 0 9 Best Language: 0 10 Dysarthria: 2 11 Extinct. and Inatten.:  0  TOTAL: 6  Premorbid mRS = 0   Labs   CBC:  Recent Labs  Lab 10/03/22 1850  WBC 5.6  NEUTROABS 1.9  HGB 11.5*  HCT 36.3  MCV 83.3  PLT [id number]    Basic Metabolic Panel:  Lab Results  Component Value Date   NA 137 10/03/2022   K 3.8 10/03/2022   CO2 22 10/03/2022   GLUCOSE 94 10/03/2022   BUN 12 10/03/2022   CREATININE 0.66 10/03/2022   CALCIUM 8.9 10/03/2022   GFRNONAA >60 10/03/2022   GFRAA 129 04/27/2020   Lipid Panel:  Lab Results  Component Value Date   LDLCALC 84 05/15/2022   HgbA1c:  Lab Results  Component Value Date   HGBA1C 5.4 04/20/2019   Urine Drug Screen: No results found for: "LABOPIA", "COCAINSCRNUR", "LABBENZ", "AMPHETMU", "THCU", "LABBARB"  Alcohol Level     Component Value Date/Time   ETH <10 10/03/2022 1850     Impression   This is a 47 yo woman with history of anxiety, depression, hypothyroidism, and recent covid infection presents with facial droop, dysarthria and abnormal eye movements with LKW 1830. On examination she had NIHSS = 6. She initially had a L lower facial droop that was severe but variable in severity and severe dysarthria with almost unintelligible speech. She gave very poor effort on strength testing but with coaching was able to hold all extremities up without drift. She had normal eye movements in triage but on my initial examination she was unable to adduct her L eye when looking to the R or adduct her R eye when looking to the L (bilateral INO). She was later able to adduct on the R only (L INO). The eye movements were also poorly reproducible. Unilateral INO can be associated with midbrain infarct. Bilateral INO in a woman of this age would be most commonly associated with demyelinating disease. Further evaluation with MRI in the acute setting was not available at this stand-alone ED. Given the fact that she had acute onset neurologic sx, some of which appeared functional but some of which appeared organic she was given  TNK after discussion of risks, benefits, and alternatives and obtaining patient's informed consent. CTA H&N was unremarkable. She will be admitted to [facility] 4N neuro ICU for further evaluation and management.   Recommendations   - Admit to ICU under Dr. Lorrin Goodell. EDP put in a bed request and I have called [facility]. They will contact [facility] when a bed is available and if there are any questions regarding transfer. - Neurochecks and NIHSS documentation per post-TNK protocol - STAT head CT for any change in neurologic exam - Non-con head CT 24 hrs post-TNK r/o hemorrhagic conversion - MRI brain wo contrast - TTE - no aspirin for 24 hours post IV TNK and until ICH ruled out by a head CT - keep SBP less than 180/105 for the 1st 24 hours post IV TNK to reduce the risk of hemorrhagic transformation - SCDs for DVT prophylaxis; can start SQ Heparin if head CT 24 hours post IV TNK is negative for ICH - NPO; swallow study - PT/OT and speech therapy - stroke education - outpatient f/u with neurology after discharge  This patient is critically ill and at significant risk of neurological worsening, death and care requires constant monitoring of vital signs, hemodynamics,respiratory and cardiac monitoring, neurological assessment, discussion with family, other specialists and medical decision making of high complexity. I spent 70 minutes of neurocritical care time  in the care of  this patient.  This was time spent independent of any time provided by nurse practitioner or PA.  Su Monks, MD [facility] [phone]  If 7pm- 7am, please page neurology on call as listed in [place].

## 2022-10-03 NOTE — ED Notes (Signed)
Tele Neuro in progress

## 2022-10-03 NOTE — ED Triage Notes (Addendum)
Pt arrives pov with stroke-like symptoms. Pt drove herself here. Pt unable to communicate clearly on arrival. Noticeable left sided facial droop and slurred speech. Pt able to state her symptoms started 5 minutes pta. Denies headache. Code stroke activated in triage by MD Curatolo.

## 2022-10-03 NOTE — ED Notes (Signed)
Pt friend Wilhemena Durie brought in to see patient at this time; Ana reports that the patient's father is on the way.

## 2022-10-04 ENCOUNTER — Inpatient Hospital Stay (HOSPITAL_COMMUNITY): Payer: 59

## 2022-10-04 DIAGNOSIS — I639 Cerebral infarction, unspecified: Secondary | ICD-10-CM | POA: Diagnosis not present

## 2022-10-04 DIAGNOSIS — I6389 Other cerebral infarction: Secondary | ICD-10-CM

## 2022-10-04 DIAGNOSIS — I6302 Cerebral infarction due to thrombosis of basilar artery: Secondary | ICD-10-CM

## 2022-10-04 LAB — MRSA NEXT GEN BY PCR, NASAL: MRSA by PCR Next Gen: NOT DETECTED

## 2022-10-04 LAB — LIPID PANEL
Cholesterol: 129 mg/dL (ref 0–200)
HDL: 40 mg/dL — ABNORMAL LOW (ref >40)
LDL Cholesterol: 83 mg/dL (ref 0–99)
Total CHOL/HDL Ratio: 3.2 RATIO
Triglycerides: 28 mg/dL (ref <150)
VLDL: 6 mg/dL (ref 0–40)

## 2022-10-04 LAB — RAPID URINE DRUG SCREEN, HOSP PERFORMED
Amphetamines: NOT DETECTED
Barbiturates: NOT DETECTED
Benzodiazepines: NOT DETECTED
Cocaine: NOT DETECTED
Opiates: POSITIVE — AB
Tetrahydrocannabinol: NOT DETECTED

## 2022-10-04 LAB — HEMOGLOBIN A1C
Hgb A1c MFr Bld: 5 % (ref 4.8–5.6)
Mean Plasma Glucose: 96.8 mg/dL

## 2022-10-04 LAB — HIV ANTIBODY (ROUTINE TESTING W REFLEX): HIV Screen 4th Generation wRfx: NONREACTIVE

## 2022-10-04 MED ORDER — THYROID 60 MG PO TABS
120.0000 mg | ORAL_TABLET | Freq: Every day | ORAL | Status: DC
  Administered 2022-10-04 – 2022-10-06 (×3): 120 mg via ORAL
  Filled 2022-10-04 (×3): qty 2

## 2022-10-04 MED ORDER — ATORVASTATIN CALCIUM 10 MG PO TABS
20.0000 mg | ORAL_TABLET | Freq: Every day | ORAL | Status: DC
  Administered 2022-10-04 – 2022-10-08 (×5): 20 mg via ORAL
  Filled 2022-10-04 (×5): qty 2

## 2022-10-04 MED ORDER — MORPHINE SULFATE (PF) 2 MG/ML IV SOLN
2.0000 mg | Freq: Once | INTRAVENOUS | Status: AC
  Administered 2022-10-04: 2 mg via INTRAVENOUS
  Filled 2022-10-04: qty 1

## 2022-10-04 MED ORDER — PRAVASTATIN SODIUM 10 MG PO TABS: 20.0000 mg | ORAL_TABLET | Freq: Every day | ORAL | Status: DC

## 2022-10-04 MED ORDER — METHOCARBAMOL 1000 MG/10ML IJ SOLN
500.0000 mg | Freq: Once | INTRAVENOUS | Status: AC
  Administered 2022-10-04: 500 mg via INTRAVENOUS
  Filled 2022-10-04: qty 500

## 2022-10-04 MED ORDER — TOPIRAMATE 25 MG PO TABS
75.0000 mg | ORAL_TABLET | Freq: Every day | ORAL | Status: DC
  Administered 2022-10-04 – 2022-10-07 (×4): 75 mg via ORAL
  Filled 2022-10-04 (×4): qty 3

## 2022-10-04 MED ORDER — ATORVASTATIN CALCIUM 10 MG PO TABS: 20.0000 mg | ORAL_TABLET | Freq: Every day | ORAL | Status: DC

## 2022-10-04 MED ORDER — PANTOPRAZOLE SODIUM 40 MG PO TBEC
40.0000 mg | DELAYED_RELEASE_TABLET | Freq: Every day | ORAL | Status: DC
  Administered 2022-10-04 – 2022-10-07 (×4): 40 mg via ORAL
  Filled 2022-10-04 (×4): qty 1

## 2022-10-04 NOTE — H&P (Signed)
NEUROLOGY CONSULTATION NOTE   Date of service: October 04, 2022 Patient Name: Donna Mccarthy MRN:  [id number] DOB:  [birth year] _ _ _   _ __   _ __ _ _  __ __   _ __   __ _  History of Present Illness  Donna Mccarthy is a 47 y.o. female with PMH significant for GERD, hypothyroidism, obesity, recent covid who was driving and had sudden onset L facial droop, dysarthria, blurred vision. She pulled over and then drove straight to [facility] ED where code stroke was activated.  She was evaluated and noted to have fluctuating exam with concern for INO. She was given tnkase. CTA with no LVO. She was transferred to [facility] Neuro ICU for further evaluation.  She reports headache, endorses diplopia.  No prior similar episodes.  Reports extensive history of migrainous headaches and has a left frontal headache at this time.  She describes his headache as throbbing.  She denies any substance use, does not smoke, does not drink alcohol.  She denies any stressors but on discussion with her father later, who reports that patient has been under extreme stress due to some issues with her ex significant other.   LKW: [id number] on 10/03/2022. mRS: 0 tNKASE: Was given TNKase at [facility] ED. Thrombectomy: not offered, no LVO NIHSS components Score: Comment  1a Level of Conscious 0[x]$  1[]$  2[]$  3[]$      1b LOC Questions 0[x]$  1[]$  2[]$       1c LOC Commands 0[x]$  1[]$  2[]$       2 Best Gaze 0[]$  1[x]$  2[]$       3 Visual 0[x]$  1[]$  2[]$  3[]$      4 Facial Palsy 0[x]$  1[]$  2[]$  3[]$      5a Motor Arm - left 0[x]$  1[]$  2[]$  3[]$  4[]$  UN[]$    5b Motor Arm - Right 0[x]$  1[]$  2[]$  3[]$  4[]$  UN[]$    6a Motor Leg - Left 0[x]$  1[]$  2[]$  3[]$  4[]$  UN[]$    6b Motor Leg - Right 0[x]$  1[]$  2[]$  3[]$  4[]$  UN[]$    7 Limb Ataxia 0[x]$  1[]$  2[]$  3[]$  UN[]$     8 Sensory 0[]$  1[]$  2[x]$  UN[]$      9 Best Language 0[x]$  1[]$  2[]$  3[]$      10 Dysarthria 0[]$  1[]$  2[x]$  UN[]$      11 Extinct. and Inattention 0[x]$  1[]$  2[]$       TOTAL: 5     ROS   Constitutional Denies weight loss,  fever and chills.   HEENT + changes in vision, intact hearing.   Respiratory Denies SOB and cough.   CV Denies palpitations and CP   GI Denies abdominal pain, nausea, vomiting and diarrhea.   GU Denies dysuria and urinary frequency.   MSK Denies myalgia and joint pain.   Skin Denies rash and pruritus.   Neurological + headache, intact syncope.   Psychiatric Denies recent changes in mood. Denies anxiety and depression.    Past History   Past Medical History:  Diagnosis Date   Allergy    Anemia    Anxiety    Arthritis    arthritis knees   Blood transfusion without reported diagnosis    GERD (gastroesophageal reflux disease)    Hypothyroidism    Neuromuscular disorder (HCC)    Obesity (BMI 30-39.9) 01/05/2019   Past Surgical History:  Procedure Laterality Date   BIOPSY  04/03/2020   Procedure: BIOPSY;  Surgeon: Harvel Quale, MD;  [facility];  Service: Gastroenterology;;  antral, body, distal esophagus, mid  esophagus   BRACHIOPLASTY Bilateral 07/02/2020   Procedure: BILATERAL BRACHIOPLASTY WITH LIPOSUCTION;  Surgeon: [name], MD;  Location: [place];  Service: Plastics;  Laterality: Bilateral;   DILATION AND CURETTAGE OF UTERUS     ESOPHAGOGASTRODUODENOSCOPY (EGD) WITH PROPOFOL N/A 04/03/2020   Procedure: ESOPHAGOGASTRODUODENOSCOPY (EGD) WITH PROPOFOL;  Surgeon: Harvel Quale, MD;  Location: [facility];  Service: Gastroenterology;  Laterality: N/A;  11:15   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   LIPOSUCTION Bilateral 07/02/2020   Procedure: LIPOSUCTION;  Surgeon: [name], MD;  Location: [place];  Service: Plastics;  Laterality: Bilateral;   PANNICULECTOMY N/A 08/04/2019   Procedure: PANNICULECTOMY;  Surgeon: [name], MD;  Location: [place];  Service: Plastics;  Laterality: N/A;  2 hours only, please   Family History  Problem Relation Age of Onset    Hypertension Mother    Crohn's disease Mother    Colon polyps Mother    Hypertension Father        POTS   Stroke Father        TIA   Hyperlipidemia Father    Hypertension Brother    Breast cancer Maternal Grandmother    Diabetes Maternal Grandmother    Heart failure Maternal Grandmother    Thyroid disease Paternal Grandmother    Vitamin D deficiency Paternal Grandmother    Heart failure Paternal Grandfather    Heart attack Paternal Grandfather    Colon cancer Maternal Uncle        dx'd in 12's   Colon cancer Maternal Uncle 58   Breast cancer Maternal Aunt    Social History   Socioeconomic History   Marital status: Single    Spouse name: Not on file   Number of children: 1   Years of education: 14   Highest education level: Futures trader degree: occupational, Hotel manager, or vocational program  Occupational History   Occupation: cma    Employer: [employer]  Tobacco Use   Smoking status: Never   Smokeless tobacco: Never  Vaping Use   Vaping Use: Never used  Substance and Sexual Activity   Alcohol use: No   Drug use: No   Sexual activity: Yes    Birth control/protection: I.U.D.  Other Topics Concern   Not on file  Social History Narrative   Lives alone.   Son [name] lives in [place], [place], girlfriend is pregnant.  Graduates from school on 07/2022.   Works in [employer] pool      Updated 04/2022   Social Determinants of Health   Financial Resource Strain: Not on file  Food Insecurity: No Food Insecurity (10/03/2022)   Hunger Vital Sign    Worried About Running Out of Food in the Last Year: Never true    Ran Out of Food in the Last Year: Never true  Transportation Needs: No Transportation Needs (10/03/2022)   PRAPARE - Hydrologist (Medical): No    Lack of Transportation (Non-Medical): No  Physical Activity: Not on file  Stress: Not on file  Social Connections: Not on file   Allergies  Allergen Reactions   Ketorolac Tromethamine Other (See  Comments)    "felt like whole body was on fire"   Penicillins Other (See Comments)    Mother said she had a rash   Celebrex [Celecoxib]     itching   Ketorolac Other (See Comments)   Other Other (See Comments)   Penicillin G  Other (See Comments)    Medications   Medications Prior to Admission  Medication Sig Dispense Refill Last Dose   ALPRAZolam (XANAX) 0.25 MG tablet Take 1 tablet (0.25 mg total) by mouth 3 (three) times daily as needed for anxiety. (Patient not taking: Reported on 09/18/2022) 10 tablet 0    aspirin-acetaminophen-caffeine (EXCEDRIN MIGRAINE) 250-250-65 MG tablet Take 2 tablets by mouth every 6 (six) hours as needed for headache. (Patient not taking: Reported on 05/14/2022)      buPROPion (WELLBUTRIN XL) 300 MG 24 hr tablet Take 1 tablet (300 mg total) by mouth daily. 90 tablet 0    levonorgestrel (MIRENA) 20 MCG/24HR IUD 1 each by Intrauterine route once.      loratadine (CLARITIN) 10 MG tablet Take 10 mg by mouth daily. (Patient not taking: Reported on 09/29/2022)      Magnesium 250 MG TABS Take by mouth.      melatonin 3 MG TABS tablet Take 2 tablet by mouth at bedtime.      naproxen (NAPROSYN) 500 MG tablet Take 1,500 mg by mouth 2 (two) times daily with a meal.      NON FORMULARY Take 1 capsule by mouth daily. (Patient not taking: Reported on 09/29/2022)      norethindrone (AYGESTIN) 5 MG tablet Take 2 tablets by mouth once daily 30 tablet 1    Semaglutide-Weight Management (WEGOVY) 0.25 MG/0.5ML SOAJ Inject 0.25 mg into the skin once a week. (Patient not taking: Reported on 08/25/2022) 2 mL 5    Semaglutide-Weight Management (WEGOVY) 0.25 MG/0.5ML SOAJ Inject 0.25 mg into the skin once a week. (Patient not taking: Reported on 08/25/2022) 2 mL 5    Semaglutide-Weight Management (WEGOVY) 0.5 MG/0.5ML SOAJ Inject 0.5 mg into the skin once a week. (Patient not taking: Reported on 08/25/2022) 4 mL 5    SUMAtriptan (IMITREX) 100 MG tablet Take 1 tablet (100 mg total) by mouth once  for 1 dose. May repeat in 2 hours if headache persists or recurs. (Patient not taking: Reported on 05/14/2022) 10 tablet 1    thyroid (NP THYROID) 120 MG tablet Take 1 tablet (120 mg total) by mouth daily on an empty stomach. 30 tablet 5    thyroid (NP THYROID) 60 MG tablet Take 1 tablet by mouth on an empty stomach once a day 30 tablet 5    topiramate (TOPAMAX) 25 MG tablet Take 3 tablets (75 mg total) by mouth at bedtime. 270 tablet 3    Vitamin D, Ergocalciferol, (DRISDOL) 1.25 MG (50000 UNIT) CAPS capsule Take 1 capsule (50,000 Units total) by mouth every 7 (seven) days. 12 capsule 0      Vitals   Vitals:   10/03/22 2301 10/03/22 2315 10/03/22 2330 10/03/22 2345  BP:  121/79 122/79 121/75  Pulse:  77 68 66  Resp:  15 17 19  $ Temp:      TempSrc:      SpO2: 100% 99% 100% 100%  Weight:         Body mass index is 29.72 kg/m.  Physical Exam   General: Laying comfortably in bed; in no acute distress.  HENT: Normal oropharynx and mucosa. Normal external appearance of ears and nose.  Neck: Supple, no pain or tenderness  CV: No JVD. No peripheral edema.  Pulmonary: Symmetric Chest rise. Normal respiratory effort.  Abdomen: Soft to touch, non-tender.  Ext: No cyanosis, edema, or deformity  Skin: No rash. Normal palpation of skin.   Musculoskeletal: Normal digits and nails by inspection.  No clubbing.   Neurologic Examination  Mental status/Cognition: Alert, oriented to self, place, month and year, good attention.  Speech/language: Fluent, comprehension intact, object naming intact, repetition intact.  Cranial nerves:   CN II Pupils equal and reactive to light, no VF deficits    CN III,IV,VI Dysonjugate gaze. despite that, EOM intact, no gaze preference or deviation, no nystagmus    CN V normal sensation in V1, V2, and V3 segments bilaterally    CN VII no asymmetry, no nasolabial fold flattening    CN VIII normal hearing to speech    CN IX & X normal palatal elevation, no uvular  deviation    CN XI 5/5 head turn and 5/5 shoulder shrug bilaterally    CN XII midline tongue protrusion    Motor:  Muscle bulk: normal, tone normal, pronator drift none tremor none Mvmt Root Nerve  Muscle Right Left Comments  SA C5/6 Ax Deltoid 4+ 5   EF C5/6 Mc Biceps 4+ 5   EE C6/7/8 Rad Triceps 4+ 5   WF C6/7 Med FCR     WE C7/8 PIN ECU     F Ab C8/T1 U ADM/FDI 4 5   HF L1/2/3 Fem Illopsoas 5 5   KE L2/3/4 Fem Quad 5 5   DF L4/5 D Peron Tib Ant 5 5   PF S1/2 Tibial Grc/Sol 4 5    Sensation:  Light touch Decreased in right upper extremity right lower extremity to touch.   Pin prick    Temperature    Vibration   Proprioception    Coordination/Complex Motor:  - Finger to Nose intact BL - Heel to shin unable to do - Rapid alternating movement are slowed on the right - Gait: deferred for patient safety.  Labs   CBC:  Recent Labs  Lab 10/03/22 1850  WBC 5.6  NEUTROABS 1.9  HGB 11.5*  HCT 36.3  MCV 83.3  PLT [id number]    Basic Metabolic Panel:  Lab Results  Component Value Date   NA 137 10/03/2022   K 3.8 10/03/2022   CO2 22 10/03/2022   GLUCOSE 94 10/03/2022   BUN 12 10/03/2022   CREATININE 0.66 10/03/2022   CALCIUM 8.9 10/03/2022   GFRNONAA >60 10/03/2022   GFRAA 129 04/27/2020   Lipid Panel:  Lab Results  Component Value Date   LDLCALC 84 05/15/2022   HgbA1c:  Lab Results  Component Value Date   HGBA1C 5.4 04/20/2019   Urine Drug Screen: No results found for: "LABOPIA", "COCAINSCRNUR", "LABBENZ", "AMPHETMU", "THCU", "LABBARB"  Alcohol Level     Component Value Date/Time   ETH <10 10/03/2022 1850    CT Head without contrast(Personally reviewed): CTH was negative for a large hypodensity concerning for a large territory infarct or hyperdensity concerning for an ICH  CT angio Head and Neck with contrast(Personally reviewed): No LVO  MRI Brain: pending  Impression   DESTINII GRIEGER is a 47 y.o. female with PMH significant for GERD,  hypothyroidism, obesity, recent covid who was driving and had sudden onset L facial droop, dysarthria, blurred vision. She drove over to [facility] ED. She was evaluated and noted to have fluctuating exam with concern for INO. She was given tnkase. CTA with no LVO. She was transferred to [facility] Neuro ICU for further evaluation.  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  Patient denies any stressors, but on discussion with patient's father reports that patient has been stressed out by her  ex.  Recommendations   Dysarthric speech, dysconjugate gaze and R sided numbness and mild weakness with fluctuating exam concern for stroke mimic vs stroke s/p tnkase: Plan: - Frequent NeuroChecks for post tPA care per stroke unit protocol: - Initial CTH demonstrated no acute hemorrhage or mass - MRI Brain - pending - CTA - no LVO, no significant stenosis. - TTE -pending - Lipid Panel: LDL -pending  - Statin: If LDL over 70. - HbA1c: Pending - Antithrombotic: Start ASA 81 mg daily if 24 h CTH does not show acute hemorrhage - DVT prophylaxis: SCDs. Pharmacologic prophylaxis if 24 h CTH does not demonstrate acute hemorrhage - Systolic Blood Pressure goal: < 180 mm Hg - Telemetry monitoring for arrhythmia: 72 hours - Swallow screen - ordered - PT/OT/SLP consults   Hypothyroidism: Continue home Synthroid.  Migrainous headache: Longstanding history and currently reports a migrainous headache. Continue home Topamax.  ______________________________________________________________________  This patient is critically ill and at significant risk of neurological worsening, death and care requires constant monitoring of vital signs, hemodynamics,respiratory and cardiac monitoring, neurological assessment, discussion with family, other specialists and medical decision making of high complexity. I spent 40 minutes of neurocritical care time  in the care of  this  patient. This was time spent independent of any time provided by nurse practitioner or PA.  [place] Pager Number [phone] 10/04/2022  12:35 AM   Thank you for the opportunity to take part in the care of this patient. If you have any further questions, please contact the neurology consultation attending.  Signed,  [place] Pager Number [phone] _ _ _   _ __   _ __ _ _  __ __   _ __   __ _

## 2022-10-04 NOTE — Progress Notes (Signed)
*  PRELIMINARY RESULTS* Echocardiogram 2D Echocardiogram has been performed.  Donna Mccarthy 10/04/2022, 5:37 PM

## 2022-10-04 NOTE — Progress Notes (Signed)
OT Cancellation Note  Patient Details Name: Donna Mccarthy MRN: [id number] DOB: 1976-02-06   Cancelled Treatment:    Reason Eval/Treat Not Completed: Active bedrest order  Will check back tomorrow to proceed with OT eval if pt is off of bedrest and able to participate.    Lilac Hoff OTR/L 10/04/2022, 12:11 PM

## 2022-10-04 NOTE — Progress Notes (Signed)
STROKE TEAM PROGRESS NOTE   SUBJECTIVE (INTERVAL HISTORY) No family is at the bedside.  Overall her condition is stable. 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 She did have some functional component on exam, but disconjugate eyes and diplopia more consistent with brainstem infarct. MRI pending.    OBJECTIVE Temp:  [97.9 F (36.6 C)-98.7 F (37.1 C)] 97.9 F (36.6 C) (02/17 1100) Pulse Rate:  [57-88] 68 (02/17 1900) Cardiac Rhythm: Normal sinus rhythm (02/17 0800) Resp:  [8-31] 19 (02/17 1900) BP: (104-138)/(65-96) 126/72 (02/17 1900) SpO2:  [94 %-100 %] 100 % (02/17 1900) FiO2 (%):  [21 %] 21 % (02/16 2301) Weight:  [81 kg] 81 kg (02/16 2300)  Recent Labs  Lab 10/03/22 1849  GLUCAP 87   Recent Labs  Lab 10/03/22 1850  NA 137  K 3.8  CL 109  CO2 22  GLUCOSE 94  BUN 12  CREATININE 0.66  CALCIUM 8.9   Recent Labs  Lab 10/03/22 1850  AST 23  ALT 13  ALKPHOS 69  BILITOT 0.6  PROT 8.0  ALBUMIN 4.2   Recent Labs  Lab 10/03/22 1850  WBC 5.6  NEUTROABS 1.9  HGB 11.5*  HCT 36.3  MCV 83.3  PLT 241   No results for input(s): "CKTOTAL", "CKMB", "CKMBINDEX", "TROPONINI" in the last 168 hours. Recent Labs    10/03/22 1850  LABPROT 14.1  INR 1.1   No results for input(s): "COLORURINE", "LABSPEC", "PHURINE", "GLUCOSEU", "HGBUR", "BILIRUBINUR", "KETONESUR", "PROTEINUR", "UROBILINOGEN", "NITRITE", "LEUKOCYTESUR" in the last 72 hours.  Invalid input(s): "APPERANCEUR"     Component Value Date/Time   CHOL 129 10/04/2022 0244   CHOL 160 05/15/2022 0845   TRIG 28 10/04/2022 0244   HDL 40 (L) 10/04/2022 0244   HDL 66 05/15/2022 0845   CHOLHDL 3.2 10/04/2022 0244   VLDL 6 10/04/2022 0244   LDLCALC 83 10/04/2022 0244   LDLCALC 84 05/15/2022 0845   Lab Results  Component Value Date   HGBA1C 5.0 10/04/2022      Component Value Date/Time   LABOPIA POSITIVE (A) 10/04/2022 1019    COCAINSCRNUR NONE DETECTED 10/04/2022 1019   LABBENZ NONE DETECTED 10/04/2022 1019   AMPHETMU NONE DETECTED 10/04/2022 1019   THCU NONE DETECTED 10/04/2022 1019   LABBARB NONE DETECTED 10/04/2022 1019    Recent Labs  Lab 10/03/22 1850  ETH <10    I have personally reviewed the radiological images below and agree with the radiology interpretations.  CT ANGIO HEAD NECK W WO CM (CODE STROKE)  Result Date: 10/03/2022 CLINICAL DATA:  Stroke-like symptoms EXAM: CT ANGIOGRAPHY HEAD AND NECK TECHNIQUE: Multidetector CT imaging of the head and neck was performed using the standard protocol during bolus administration of intravenous contrast. Multiplanar CT image reconstructions and MIPs were obtained to evaluate the vascular anatomy. Carotid stenosis measurements (when applicable) are obtained utilizing NASCET criteria, using the distal internal carotid diameter as the denominator. RADIATION DOSE REDUCTION: This exam was performed according to the departmental dose-optimization program which includes automated exposure control, adjustment of the mA and/or kV according to patient size and/or use of iterative reconstruction technique. CONTRAST:  10m OMNIPAQUE IOHEXOL 350 MG/ML SOLN COMPARISON:  No prior CTA available, correlation is made with CT head 10/03/2022 FINDINGS: CT HEAD FINDINGS For noncontrast findings, please see same day CT head. CTA NECK FINDINGS Aortic arch: Standard branching. Imaged portion shows no evidence of aneurysm or dissection. No significant stenosis of  the major arch vessel origins. Right carotid system: No evidence of stenosis, dissection, or occlusion. Left carotid system: No evidence of stenosis, dissection, or occlusion. Vertebral arteries: No evidence of stenosis, dissection, or occlusion. Skeleton: No acute osseous abnormality. Other neck: No acute finding. Upper chest: No focal pulmonary opacity or pleural effusion. Review of the MIP images confirms the above findings CTA HEAD  FINDINGS Evaluation is somewhat limited by bolus timing. Anterior circulation: Both internal carotid arteries are patent to the termini, without significant stenosis. A1 segments patent. Normal anterior communicating artery. Anterior cerebral arteries are patent to their distal aspects. The bilateral MCAs appear somewhat narrow, without evidence of downstream dilatation. No focal M1 stenosis or occlusion. MCA branches perfused and symmetric. Posterior circulation: Vertebral arteries patent to the vertebrobasilar junction without stenosis. Posterior inferior cerebellar arteries not well seen. Basilar patent to its distal aspect. Superior cerebellar arteries patent proximally. Patent P1 segments. The bilateral PCAs appear somewhat narrow, without evidence of downstream dilatation, but appear to be perfused proximally. Evaluation of the distal PCAs is limited by venous contamination. Venous sinuses: Patent. Anatomic variants: None significant. Review of the MIP images confirms the above findings IMPRESSION: 1. No large vessel occlusion or significant stenosis in the neck. 2. The bilateral MCAs and PCAs appear somewhat narrow, without evidence of downstream dilatation, but appear to be perfused proximally. This could be secondary to bolus timing, but a vasculitis or reversible cerebral vasoconstriction syndrome (RCVS) is not excluded. Electronically Signed   By: Merilyn Baba M.D.   On: 10/03/2022 20:05   CT HEAD CODE STROKE WO CONTRAST  Result Date: 10/03/2022 CLINICAL DATA:  Code stroke. Neuro deficit, acute, stroke suspected. Left-sided facial droop. Right-sided weakness. EXAM: CT HEAD WITHOUT CONTRAST TECHNIQUE: Contiguous axial images were obtained from the base of the skull through the vertex without intravenous contrast. RADIATION DOSE REDUCTION: This exam was performed according to the departmental dose-optimization program which includes automated exposure control, adjustment of the mA and/or kV according  to patient size and/or use of iterative reconstruction technique. COMPARISON:  Brain MRI 05/18/2007.  Head CT 05/18/2007. FINDINGS: Brain: Cerebral volume is normal. There is no acute intracranial hemorrhage. No demarcated cortical infarct. No extra-axial fluid collection. No evidence of an intracranial mass. No midline shift. Vascular: No hyperdense vessel. Skull: No fracture or aggressive osseous lesion. Sinuses/Orbits: No mass or acute finding within the imaged orbits. No significant paranasal sinus disease at the imaged levels. ASPECTS [facility] Stroke Program Early CT Score) - Ganglionic level infarction (caudate, lentiform nuclei, internal capsule, insula, M1-M3 cortex): 7 - Supraganglionic infarction (M4-M6 cortex): 3 Total score (0-10 with 10 being normal): 10 No evidence of an acute intracranial abnormality. These results were called by telephone at the time of interpretation on 10/03/2022 at 7:03 pm to provider ADAM CURATOLO , who verbally acknowledged these results. IMPRESSION: No evidence of an acute intracranial abnormality. ASPECTS is 10. Electronically Signed   By: Kellie Simmering D.O.   On: 10/03/2022 19:04     PHYSICAL EXAM  Temp:  [97.9 F (36.6 C)-98.7 F (37.1 C)] 97.9 F (36.6 C) (02/17 1100) Pulse Rate:  [57-88] 68 (02/17 1900) Resp:  [8-31] 19 (02/17 1900) BP: (104-138)/(65-96) 126/72 (02/17 1900) SpO2:  [94 %-100 %] 100 % (02/17 1900) FiO2 (%):  [21 %] 21 % (02/16 2301) Weight:  [81 kg] 81 kg (02/16 2300)  General - Well nourished, well developed, lethargic and depressed mood.  Ophthalmologic - fundi not visualized due to noncooperation.  Cardiovascular - Regular  rhythm and rate.  Neuro - awake, alert, eyes open, orientated to age, place, time and people. No aphasia, fluent language but mild dysarthria and bradyphonia, following all simple commands. Able to name and repeat. B/l eyes lateral gaze incomplete with diplopia, tracking bilaterally, visual field full, PERRL. Left  mild facial droop, but symmetrical closing eyes. Decreased left ear hearing. Tongue protrusion to the left but able to move to right on request. BUEs against gravity with RUE drift but no pronation. BLEs against gravity but RLE lack of effort on exam. Sensation decreased light touch on the right subjectively, b/l FTN intact grossly no ataxia but slow on action, gait not tested.    ASSESSMENT/PLAN Donna Mccarthy is a 47 y.o. female with history of recent COVID 2 weeks ago, migraine admitted for slurred speech, blurry vision, double vision, left facial droop during driving.  TNK given at med center [facility].  Stroke: Possible brainstem infarct secondary to small vessel disease source CT no acute abnormality CT head and neck questionable bilateral MCA and PCA narrowing MRI pending MRA pending 2D Echo pending LDL 83 HgbA1c 5.0 UDS positive for opiates SCD's for VTE prophylaxis No antithrombotic prior to admission, now on No antithrombotic within 24 hours of TNK Ongoing aggressive stroke risk factor management Therapy recommendations: Pending Disposition: Pending  Hypertension Stable BP goal less than 180/105 after TNK Long term BP goal normotensive  Hyperlipidemia Home meds: None LDL 83, goal < 70 Now on Lipitor 20, no high intensity statin given LDL near goal Continue statin at discharge  Other Stroke Risk Factors   Other Active Problems Migraine on Topamax Recent COVID infection  Hospital day # 1  This patient is critically ill due to stroke status post TNK and at significant risk of neurological worsening, death form recurrent stroke, hemorrhagic transformation, bleeding from TNK. This patient's care requires constant monitoring of vital signs, hemodynamics, respiratory and cardiac monitoring, review of multiple databases, neurological assessment, discussion with family, other specialists and medical decision making of high complexity. I spent 30 minutes of  neurocritical care time in the care of this patient.    Donna Hawking, MD PhD Stroke Neurology 10/04/2022 8:26 PM    To contact Stroke Continuity provider, please refer to [email or web address] After hours, contact General Neurology

## 2022-10-04 NOTE — Evaluation (Signed)
Clinical/Bedside Swallow Evaluation Patient Details  Name: Donna Mccarthy MRN: [id number] Date of Birth: 1975/11/12  Today's Date: 10/04/2022 Time: SLP Start Time (ACUTE ONLY): 1012 SLP Stop Time (ACUTE ONLY): 1030 SLP Time Calculation (min) (ACUTE ONLY): 18 min  Past Medical History:  Past Medical History:  Diagnosis Date   Allergy    Anemia    Anxiety    Arthritis    arthritis knees   Blood transfusion without reported diagnosis    GERD (gastroesophageal reflux disease)    Hypothyroidism    Neuromuscular disorder (HCC)    Obesity (BMI 30-39.9) 01/05/2019   Past Surgical History:  Past Surgical History:  Procedure Laterality Date   BIOPSY  04/03/2020   Procedure: BIOPSY;  Surgeon: Harvel Quale, MD;  [facility];  Service: Gastroenterology;;  antral, body, distal esophagus, mid esophagus   BRACHIOPLASTY Bilateral 07/02/2020   Procedure: BILATERAL BRACHIOPLASTY WITH LIPOSUCTION;  Surgeon: [name], MD;  Location: [place];  Service: Plastics;  Laterality: Bilateral;   DILATION AND CURETTAGE OF UTERUS     ESOPHAGOGASTRODUODENOSCOPY (EGD) WITH PROPOFOL N/A 04/03/2020   Procedure: ESOPHAGOGASTRODUODENOSCOPY (EGD) WITH PROPOFOL;  Surgeon: Harvel Quale, MD;  [facility];  Service: Gastroenterology;  Laterality: N/A;  11:15   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   LIPOSUCTION Bilateral 07/02/2020   Procedure: LIPOSUCTION;  Surgeon: [name], MD;  Location: [place];  Service: Plastics;  Laterality: Bilateral;   PANNICULECTOMY N/A 08/04/2019   Procedure: PANNICULECTOMY;  Surgeon: [name], MD;  Location: [place];  Service: Plastics;  Laterality: N/A;  2 hours only, please   HPI:  Donna Mccarthy is a 47 y.o. female with PMH significant for GERD, hypothyroidism, obesity, recent covid who was driving and had sudden onset L facial droop, dysarthria,  blurred vision. She drove over to [facility] ED. She was evaluated and noted to have fluctuating exam with concern for INO. She was given tnkase. CTA with no LVO. She was transferred to [facility] Neuro ICU for further evaluation. Per neurology note, "suspect stroke mimic. 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    Assessment / Plan / Recommendation  Clinical Impression  Pt suspected to have grossly functional oropharyngeal swallow function. She did exhibit some atypical oral symptoms during clinical swallow evaluation however she is being followed for psychogenic etiology per nursing report. MRI of head still pending completion for true stroke workup. Pt alert and exhibited some reduced labial seal with thins via cup, more vertical mastication with discoordinated lingual and bolus transit. No overt s/sx of aspiration with any PO. Laryngeal elevation per palpation appeared functional and vocal quality remained clear. Recommend regular thin liquid diet and meds as tolerated. SLP to follow up x1 for swallow to ensure diet tolerance.  SLP Visit Diagnosis: Dysphagia, unspecified (R13.10)    Aspiration Risk  Mild aspiration risk    Diet Recommendation   Regular, thin liquids   Medication Administration: Whole meds with liquid    Other  Recommendations Oral Care Recommendations: Oral care BID    Recommendations for follow up therapy are one component of a multi-disciplinary discharge planning process, led by the attending physician.  Recommendations may be updated based on patient status, additional functional criteria and insurance authorization.  Follow up Recommendations  (TBD)      Assistance Recommended at Discharge    Functional Status Assessment Patient has had a recent decline in their functional  status and demonstrates the ability to make significant improvements in function in a reasonable and predictable amount of time.  Frequency and Duration min 1 x/week  1 week        Prognosis Prognosis for improved oropharyngeal function: Good      Swallow Study   General Date of Onset: 10/03/22 HPI: Donna Mccarthy is a 47 y.o. female with PMH significant for GERD, hypothyroidism, obesity, recent covid who was driving and had sudden onset L facial droop, dysarthria, blurred vision. She drove over to [facility] ED. She was evaluated and noted to have fluctuating exam with concern for INO. She was given tnkase. CTA with no LVO. She was transferred to [facility] Neuro ICU for further evaluation. Per neurology note, "suspect stroke mimic. 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 Type of Study: Bedside Swallow Evaluation Previous Swallow Assessment: none on file Diet Prior to this Study: NPO Temperature Spikes Noted: No Respiratory Status: Room air History of Recent Intubation: No Behavior/Cognition: Alert;Cooperative Oral Cavity Assessment: Within Functional Limits Oral Care Completed by SLP: Yes Oral Cavity - Dentition: Adequate natural dentition Vision: Functional for self-feeding Self-Feeding Abilities: Needs assist Patient Positioning: Upright in bed Baseline Vocal Quality: Low vocal intensity Volitional Swallow: Able to elicit    Oral/Motor/Sensory Function Overall Oral Motor/Sensory Function: Other (comment) (when cued to smile, pt without full mobilization of left lip (concern for psychogenic symptoms as presenting atypical in appearance))   Ice Chips Ice chips: Impaired Presentation: Spoon Oral Phase Impairments: Reduced lingual movement/coordination   Thin Liquid Thin Liquid: Impaired Presentation: Cup;Straw Oral Phase Impairments: Reduced labial seal;Reduced lingual movement/coordination    Nectar Thick Nectar Thick Liquid: Not tested   Honey Thick Honey Thick Liquid: Not tested   Puree Puree: Impaired Presentation: Spoon Oral Phase Impairments: Reduced lingual movement/coordination   Solid     Solid: Impaired Presentation:  Spoon Oral Phase Impairments: Reduced lingual movement/coordination Oral Phase Functional Implications: Impaired mastication      Donna Mccarthy H. MA, CCC-SLP Acute Rehabilitation Services   10/04/2022,10:40 AM

## 2022-10-04 NOTE — Evaluation (Signed)
Speech Language Pathology Evaluation Patient Details Name: Donna Mccarthy MRN: [id number] DOB: Aug 19, 1975 Today's Date: 10/04/2022 Time: [id number] SLP Time Calculation (min) (ACUTE ONLY): 16 min  Problem List:  Patient Active Problem List   Diagnosis Date Noted   CVA (cerebral vascular accident) ([place]) 10/03/2022   Stroke determined by clinical assessment ([place]) 10/03/2022   Dysphagia 03/29/2020   Nausea with vomiting 03/29/2020   Vitamin D deficiency 02/02/2020   S/P panniculectomy 01/02/2020   Family history of diabetes mellitus in grandmother 04/20/2019   Obesity (BMI 30.0-34.9) 04/20/2019   Obesity (BMI 30-39.9) 01/05/2019   Hypothyroidism 09/29/2017   Migraine 09/29/2017   Knee pain, bilateral 09/29/2017   Generalized anxiety disorder 09/29/2017   Gastroesophageal reflux disease 09/29/2017   Past Medical History:  Past Medical History:  Diagnosis Date   Allergy    Anemia    Anxiety    Arthritis    arthritis knees   Blood transfusion without reported diagnosis    GERD (gastroesophageal reflux disease)    Hypothyroidism    Neuromuscular disorder (HCC)    Obesity (BMI 30-39.9) 01/05/2019   Past Surgical History:  Past Surgical History:  Procedure Laterality Date   BIOPSY  04/03/2020   Procedure: BIOPSY;  Surgeon: Harvel Quale, MD;  [facility];  Service: Gastroenterology;;  antral, body, distal esophagus, mid esophagus   BRACHIOPLASTY Bilateral 07/02/2020   Procedure: BILATERAL BRACHIOPLASTY WITH LIPOSUCTION;  Surgeon: [name], MD;  Location: [place];  Service: Plastics;  Laterality: Bilateral;   DILATION AND CURETTAGE OF UTERUS     ESOPHAGOGASTRODUODENOSCOPY (EGD) WITH PROPOFOL N/A 04/03/2020   Procedure: ESOPHAGOGASTRODUODENOSCOPY (EGD) WITH PROPOFOL;  Surgeon: Harvel Quale, MD;  [facility];  Service: Gastroenterology;  Laterality: N/A;  11:15   LEEP  08/23/2021   CINIII c/severe  dysplasia   LIPOSUCTION Bilateral 07/02/2020   Procedure: LIPOSUCTION;  Surgeon: [name], MD;  Location: [place];  Service: Plastics;  Laterality: Bilateral;   PANNICULECTOMY N/A 08/04/2019   Procedure: PANNICULECTOMY;  Surgeon: [name], MD;  Location: [place];  Service: Plastics;  Laterality: N/A;  2 hours only, please   HPI:  Donna Mccarthy is a 47 y.o. female with PMH significant for GERD, hypothyroidism, obesity, recent covid who was driving and had sudden onset L facial droop, dysarthria, blurred vision. She drove over to [facility] ED. She was evaluated and noted to have fluctuating exam with concern for INO. She was given tnkase. CTA with no LVO. She was transferred to [facility] Neuro ICU for further evaluation. Per neurology note, "suspect stroke mimic. 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 Pt did not open eyes much during cognitive linguistic evaluation, though when she did, rapidly blinked with atypical movements. Administered [employer] Mental Status (SLUMS). Pt achieved 12/30 with deficits exhibited in mental manipulation, delayed and immediate recall, and visual spatial skills. SLP believes that the suspected psychogenic etiology is contributing for cognitive deficits appreciated during formal cognitive testing. 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 No deficits appreciated in receptive or expressive language or motor speech skills.  Will continue to follow as imaging pending and goals of care established.    SLP Assessment  SLP Recommendation/Assessment: Patient needs continued [facility] SLP Visit Diagnosis: Cognitive communication deficit (R41.841)     Recommendations for follow up therapy are one component of a multi-disciplinary discharge planning process, led by the attending physician.  Recommendations may be updated based on patient status, additional functional criteria and insurance authorization.    Follow Up Recommendations   (TBD)    Assistance Recommended at Discharge     Functional Status Assessment Patient has had a recent decline in their functional status and demonstrates the ability to make significant improvements in function in a reasonable and predictable amount of time.  Frequency and Duration min 1 x/week  1 week      SLP Evaluation Cognition  Overall Cognitive Status: Difficult to assess Arousal/Alertness: Awake/alert Orientation Level: Oriented to place;Oriented to time;Oriented to situation Year: 2024 Month: February Day of Week: Correct Attention: Focused;Sustained Focused Attention: Appears intact Sustained Attention: Appears intact Memory: Impaired Memory Impairment: Decreased recall of new information;Decreased short term memory Awareness: Appears intact Executive Function: Organizing;Self Monitoring Organizing: Impaired       Comprehension  Auditory Comprehension Overall Auditory Comprehension: Appears within functional limits for tasks assessed Visual Recognition/Discrimination Discrimination: Exceptions to [facility]    Expression Expression Primary Mode of Expression: Verbal Verbal Expression Overall Verbal Expression: Appears within functional limits for tasks assessed Written Expression Dominant Hand: Right   Oral / Motor  Oral Motor/Sensory Function Overall Oral Motor/Sensory Function: Other (comment) Motor Speech Overall Motor Speech: Appears within functional limits for tasks assessed           Hayden Rasmussen MA, CCC-SLP Acute Rehabilitation Services   10/04/2022, 11:10 AM

## 2022-10-04 NOTE — Progress Notes (Signed)
PT Cancellation Note  Patient Details Name: ITZA CALECA MRN: [id number] DOB: 25-Jul-1976   Cancelled Treatment:    Reason Eval/Treat Not Completed: Active bedrest order  Will follow and proceed with evaluation when activity permitted.    [place]  Office [phone]  Rexanne Mano 10/04/2022, 9:29 AM

## 2022-10-05 DIAGNOSIS — I6302 Cerebral infarction due to thrombosis of basilar artery: Secondary | ICD-10-CM

## 2022-10-05 LAB — BASIC METABOLIC PANEL
Anion gap: 7 (ref 5–15)
BUN: <5 mg/dL — ABNORMAL LOW (ref 6–20)
CO2: 20 mmol/L — ABNORMAL LOW (ref 22–32)
Calcium: 8.9 mg/dL (ref 8.9–10.3)
Chloride: 112 mmol/L — ABNORMAL HIGH (ref 98–111)
Creatinine, Ser: 0.54 mg/dL (ref 0.44–1.00)
GFR, Estimated: >60 mL/min (ref >60)
Glucose, Bld: 95 mg/dL (ref 70–99)
Potassium: 3.7 mmol/L (ref 3.5–5.1)
Sodium: 139 mmol/L (ref 135–145)

## 2022-10-05 LAB — CBC
HCT: 35.6 % — ABNORMAL LOW (ref 36.0–46.0)
Hemoglobin: 11.1 g/dL — ABNORMAL LOW (ref 12.0–15.0)
MCH: 26.1 pg (ref 26.0–34.0)
MCHC: 31.2 g/dL (ref 30.0–36.0)
MCV: 83.6 fL (ref 80.0–100.0)
Platelets: 171 10*3/uL (ref 150–400)
RBC: 4.26 MIL/uL (ref 3.87–5.11)
RDW: 14.2 % (ref 11.5–15.5)
WBC: 4.1 10*3/uL (ref 4.0–10.5)
nRBC: 0 % (ref 0.0–0.2)

## 2022-10-05 LAB — ECHOCARDIOGRAM COMPLETE
Area-P 1/2: 2.45 cm2
MV VTI: 0.53 cm2
S' Lateral: 2.8 cm
Weight: 2857.16 oz

## 2022-10-05 MED ORDER — ENOXAPARIN SODIUM 40 MG/0.4ML IJ SOSY
40.0000 mg | PREFILLED_SYRINGE | INTRAMUSCULAR | Status: DC
  Administered 2022-10-05 – 2022-10-08 (×4): 40 mg via SUBCUTANEOUS
  Filled 2022-10-05 (×4): qty 0.4

## 2022-10-05 MED ORDER — ASPIRIN 81 MG PO CHEW
81.0000 mg | CHEWABLE_TABLET | Freq: Every day | ORAL | Status: DC
  Administered 2022-10-05 – 2022-10-08 (×4): 81 mg via ORAL
  Filled 2022-10-05 (×4): qty 1

## 2022-10-05 MED ORDER — CLOPIDOGREL BISULFATE 75 MG PO TABS
75.0000 mg | ORAL_TABLET | Freq: Every day | ORAL | Status: DC
  Administered 2022-10-05 – 2022-10-08 (×4): 75 mg via ORAL
  Filled 2022-10-05 (×4): qty 1

## 2022-10-05 NOTE — Progress Notes (Addendum)
STROKE TEAM PROGRESS NOTE   SUBJECTIVE (INTERVAL HISTORY) Patient is seen in her room with her boyfriend at the bedside.  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  MRI was negative, so suspect small MRI negative brainstem stroke.  She is stable and ready to be transferred out of the ICU today, and will probably be heading to CIR for rehab in the next few days.   OBJECTIVE Temp:  [97.6 F (36.4 C)-98 F (36.7 C)] 97.9 F (36.6 C) (02/18 1200) Pulse Rate:  [57-83] 78 (02/18 1200) Cardiac Rhythm: Normal sinus rhythm (02/18 0800) Resp:  [13-23] 21 (02/18 1300) BP: (107-137)/(63-96) 123/75 (02/18 1300) SpO2:  [91 %-100 %] 91 % (02/18 1200)  Recent Labs  Lab 10/03/22 1849  GLUCAP 87    Recent Labs  Lab 10/03/22 1850 10/05/22 0542  NA 137 139  K 3.8 3.7  CL 109 112*  CO2 22 20*  GLUCOSE 94 95  BUN 12 <5*  CREATININE 0.66 0.54  CALCIUM 8.9 8.9    Recent Labs  Lab 10/03/22 1850  AST 23  ALT 13  ALKPHOS 69  BILITOT 0.6  PROT 8.0  ALBUMIN 4.2    Recent Labs  Lab 10/03/22 1850 10/05/22 0542  WBC 5.6 4.1  NEUTROABS 1.9  --   HGB 11.5* 11.1*  HCT 36.3 35.6*  MCV 83.3 83.6  PLT 241 171    No results for input(s): "CKTOTAL", "CKMB", "CKMBINDEX", "TROPONINI" in the last 168 hours. Recent Labs    10/03/22 1850  LABPROT 14.1  INR 1.1    No results for input(s): "COLORURINE", "LABSPEC", "PHURINE", "GLUCOSEU", "HGBUR", "BILIRUBINUR", "KETONESUR", "PROTEINUR", "UROBILINOGEN", "NITRITE", "LEUKOCYTESUR" in the last 72 hours.  Invalid input(s): "APPERANCEUR"     Component Value Date/Time   CHOL 129 10/04/2022 0244   CHOL 160 05/15/2022 0845   TRIG 28 10/04/2022 0244   HDL 40 (L) 10/04/2022 0244   HDL 66 05/15/2022 0845   CHOLHDL 3.2 10/04/2022 0244   VLDL 6 10/04/2022 0244   LDLCALC 83 10/04/2022 0244   LDLCALC 84 05/15/2022 0845   Lab Results  Component Value Date   HGBA1C 5.0 10/04/2022       Component Value Date/Time   LABOPIA POSITIVE (A) 10/04/2022 1019   COCAINSCRNUR NONE DETECTED 10/04/2022 1019   LABBENZ NONE DETECTED 10/04/2022 1019   AMPHETMU NONE DETECTED 10/04/2022 1019   THCU NONE DETECTED 10/04/2022 1019   LABBARB NONE DETECTED 10/04/2022 1019    Recent Labs  Lab 10/03/22 1850  ETH <10     I have personally reviewed the radiological images below and agree with the radiology interpretations.  ECHOCARDIOGRAM COMPLETE  Result Date: 10/05/2022    ECHOCARDIOGRAM REPORT   Patient Name:   CORETTA TRAMA Date of Exam: 10/04/2022 Medical Rec #:  [id number]          Height:       65.0 in Accession #:    [id number]         Weight:       178.6 lb Date of Birth:  08/25/1975           BSA:          1.885 m Patient Age:    47 years           BP:           135/86 mmHg Patient Gender: F  HR:           72 bpm. Exam Location:  [facility] Procedure: 2D Echo, Cardiac Doppler and Color Doppler Indications:    Stroke I63.9  History:        Patient has prior history of Echocardiogram examinations, most                 recent 11/09/2019. Stroke. GERD.  Sonographer:    Alvino Chapel RCS Referring Phys: [id number] [place]  1. Left ventricular ejection fraction, by estimation, is 60 to 65%. The left ventricle has normal function. The left ventricle has no regional wall motion abnormalities. Left ventricular diastolic parameters were normal.  2. Right ventricular systolic function is normal. The right ventricular size is normal.  3. The mitral valve is normal in structure. Trivial mitral valve regurgitation. No evidence of mitral stenosis.  4. The aortic valve is tricuspid. Aortic valve regurgitation is not visualized. No aortic stenosis is present.  5. The inferior vena cava is dilated in size with >50% respiratory variability, suggesting right atrial pressure of 8 mmHg. FINDINGS  Left Ventricle: Left ventricular ejection fraction, by estimation, is 60 to 65%. The  left ventricle has normal function. The left ventricle has no regional wall motion abnormalities. The left ventricular internal cavity size was normal in size. There is  no left ventricular hypertrophy. Left ventricular diastolic parameters were normal. Right Ventricle: The right ventricular size is normal. Right ventricular systolic function is normal. Left Atrium: Left atrial size was normal in size. Right Atrium: Right atrial size was normal in size. Pericardium: There is no evidence of pericardial effusion. Mitral Valve: The mitral valve is normal in structure. Trivial mitral valve regurgitation. No evidence of mitral valve stenosis. MV peak gradient, 96.8 mmHg. The mean mitral valve gradient is 64.0 mmHg. Tricuspid Valve: The tricuspid valve is normal in structure. Tricuspid valve regurgitation is trivial. No evidence of tricuspid stenosis. Aortic Valve: The aortic valve is tricuspid. Aortic valve regurgitation is not visualized. No aortic stenosis is present. Pulmonic Valve: The pulmonic valve was normal in structure. Pulmonic valve regurgitation is not visualized. No evidence of pulmonic stenosis. Aorta: The aortic root is normal in size and structure. Venous: The inferior vena cava is dilated in size with greater than 50% respiratory variability, suggesting right atrial pressure of 8 mmHg. IAS/Shunts: No atrial level shunt detected by color flow Doppler.  LEFT VENTRICLE PLAX 2D LVIDd:         4.70 cm   Diastology LVIDs:         2.80 cm   LV e' medial:    12.20 cm/s LV PW:         0.80 cm   LV E/e' medial:  8.1 LV IVS:        0.90 cm   LV e' lateral:   17.10 cm/s LVOT diam:     2.00 cm   LV E/e' lateral: 5.8 LV SV:         80 LV SV Index:   43 LVOT Area:     3.14 cm  RIGHT VENTRICLE RV S prime:     18.20 cm/s TAPSE (M-mode): 2.5 cm LEFT ATRIUM             Index        RIGHT ATRIUM           Index LA diam:        3.60 cm 1.91 cm/m   RA Area:  13.00 cm LA Vol (A2C):   58.1 ml 30.82 ml/m  RA Volume:    32.90 ml  17.45 ml/m LA Vol (A4C):   45.6 ml 24.19 ml/m LA Biplane Vol: 53.8 ml 28.54 ml/m  AORTIC VALVE LVOT Vmax:   131.00 cm/s LVOT Vmean:  81.300 cm/s LVOT VTI:    0.256 m  AORTA Ao Root diam: 2.90 cm MITRAL VALVE MV Area (PHT): 2.45 cm    SHUNTS MV Area VTI:   0.53 cm    Systemic VTI:  0.26 m MV Peak grad:  96.8 mmHg   Systemic Diam: 2.00 cm MV Mean grad:  64.0 mmHg MV Vmax:       4.92 m/s MV Vmean:      381.0 cm/s MV Decel Time: 310 msec MV E velocity: 99.00 cm/s MV A velocity: 97.10 cm/s MV E/A ratio:  1.02 Kirk Ruths MD Electronically signed by Kirk Ruths MD Signature Date/Time: 10/05/2022/11:17:03 AM    Final    MR BRAIN WO CONTRAST  Result Date: 10/04/2022 CLINICAL DATA:  Stroke-like symptoms EXAM: MRI HEAD WITHOUT CONTRAST MRA HEAD WITHOUT CONTRAST TECHNIQUE: Multiplanar, multi-echo pulse sequences of the brain and surrounding structures were acquired without intravenous contrast. Angiographic images of the Circle of Willis were acquired using MRA technique without intravenous contrast. COMPARISON:  05/18/2007 MRI head, correlation is made with CTA head and neck 10/03/2022 FINDINGS: MRI HEAD FINDINGS Brain: No restricted diffusion to suggest acute or subacute infarct. No acute hemorrhage, mass, mass effect, or midline shift. No hydrocephalus or extra-axial collection. No hemosiderin deposition to suggest remote hemorrhage. Cerebral volume is normal for age. Vascular: Normal arterial flow voids. Please see MRA findings below. Skull and upper cervical spine: Normal marrow signal. Sinuses/Orbits: Clear paranasal sinuses. No acute finding in the orbits. Other: The mastoids are well aerated. MRA HEAD FINDINGS Anterior circulation: Both internal carotid arteries are patent to the termini, without significant stenosis. A1 segments patent. Normal anterior communicating artery. Anterior cerebral arteries are patent to their distal aspects. No M1 stenosis or occlusion. Normal MCA bifurcations.  Distal MCA branches perfused and symmetric. Posterior circulation: Vertebral arteries patent to the vertebrobasilar junction without stenosis. Posterior inferior cerebral arteries patent bilaterally. Basilar patent to its distal aspect. Superior cerebellar arteries patent bilaterally. Patent P1 segments. PCAs perfused to their distal aspects without stenosis. The bilateral posterior communicating arteries are patent. Anatomic variants: None significant IMPRESSION: 1. No acute intracranial process. No evidence of acute or subacute infarct. 2. No intracranial large vessel occlusion or significant stenosis. Previously seen narrowing of the MCAs and PCAs on the CTA is not apparent on this exam and may have been secondary to poor vascular opacification. Electronically Signed   By: Merilyn Baba M.D.   On: 10/04/2022 21:55   MR ANGIO HEAD WO CONTRAST  Result Date: 10/04/2022 CLINICAL DATA:  Stroke-like symptoms EXAM: MRI HEAD WITHOUT CONTRAST MRA HEAD WITHOUT CONTRAST TECHNIQUE: Multiplanar, multi-echo pulse sequences of the brain and surrounding structures were acquired without intravenous contrast. Angiographic images of the Circle of Willis were acquired using MRA technique without intravenous contrast. COMPARISON:  05/18/2007 MRI head, correlation is made with CTA head and neck 10/03/2022 FINDINGS: MRI HEAD FINDINGS Brain: No restricted diffusion to suggest acute or subacute infarct. No acute hemorrhage, mass, mass effect, or midline shift. No hydrocephalus or extra-axial collection. No hemosiderin deposition to suggest remote hemorrhage. Cerebral volume is normal for age. Vascular: Normal arterial flow voids. Please see MRA findings below. Skull and upper cervical spine: Normal marrow signal.  Sinuses/Orbits: Clear paranasal sinuses. No acute finding in the orbits. Other: The mastoids are well aerated. MRA HEAD FINDINGS Anterior circulation: Both internal carotid arteries are patent to the termini, without  significant stenosis. A1 segments patent. Normal anterior communicating artery. Anterior cerebral arteries are patent to their distal aspects. No M1 stenosis or occlusion. Normal MCA bifurcations. Distal MCA branches perfused and symmetric. Posterior circulation: Vertebral arteries patent to the vertebrobasilar junction without stenosis. Posterior inferior cerebral arteries patent bilaterally. Basilar patent to its distal aspect. Superior cerebellar arteries patent bilaterally. Patent P1 segments. PCAs perfused to their distal aspects without stenosis. The bilateral posterior communicating arteries are patent. Anatomic variants: None significant IMPRESSION: 1. No acute intracranial process. No evidence of acute or subacute infarct. 2. No intracranial large vessel occlusion or significant stenosis. Previously seen narrowing of the MCAs and PCAs on the CTA is not apparent on this exam and may have been secondary to poor vascular opacification. Electronically Signed   By: Merilyn Baba M.D.   On: 10/04/2022 21:55   CT ANGIO HEAD NECK W WO CM (CODE STROKE)  Result Date: 10/03/2022 CLINICAL DATA:  Stroke-like symptoms EXAM: CT ANGIOGRAPHY HEAD AND NECK TECHNIQUE: Multidetector CT imaging of the head and neck was performed using the standard protocol during bolus administration of intravenous contrast. Multiplanar CT image reconstructions and MIPs were obtained to evaluate the vascular anatomy. Carotid stenosis measurements (when applicable) are obtained utilizing NASCET criteria, using the distal internal carotid diameter as the denominator. RADIATION DOSE REDUCTION: This exam was performed according to the departmental dose-optimization program which includes automated exposure control, adjustment of the mA and/or kV according to patient size and/or use of iterative reconstruction technique. CONTRAST:  44m OMNIPAQUE IOHEXOL 350 MG/ML SOLN COMPARISON:  No prior CTA available, correlation is made with CT head  10/03/2022 FINDINGS: CT HEAD FINDINGS For noncontrast findings, please see same day CT head. CTA NECK FINDINGS Aortic arch: Standard branching. Imaged portion shows no evidence of aneurysm or dissection. No significant stenosis of the major arch vessel origins. Right carotid system: No evidence of stenosis, dissection, or occlusion. Left carotid system: No evidence of stenosis, dissection, or occlusion. Vertebral arteries: No evidence of stenosis, dissection, or occlusion. Skeleton: No acute osseous abnormality. Other neck: No acute finding. Upper chest: No focal pulmonary opacity or pleural effusion. Review of the MIP images confirms the above findings CTA HEAD FINDINGS Evaluation is somewhat limited by bolus timing. Anterior circulation: Both internal carotid arteries are patent to the termini, without significant stenosis. A1 segments patent. Normal anterior communicating artery. Anterior cerebral arteries are patent to their distal aspects. The bilateral MCAs appear somewhat narrow, without evidence of downstream dilatation. No focal M1 stenosis or occlusion. MCA branches perfused and symmetric. Posterior circulation: Vertebral arteries patent to the vertebrobasilar junction without stenosis. Posterior inferior cerebellar arteries not well seen. Basilar patent to its distal aspect. Superior cerebellar arteries patent proximally. Patent P1 segments. The bilateral PCAs appear somewhat narrow, without evidence of downstream dilatation, but appear to be perfused proximally. Evaluation of the distal PCAs is limited by venous contamination. Venous sinuses: Patent. Anatomic variants: None significant. Review of the MIP images confirms the above findings IMPRESSION: 1. No large vessel occlusion or significant stenosis in the neck. 2. The bilateral MCAs and PCAs appear somewhat narrow, without evidence of downstream dilatation, but appear to be perfused proximally. This could be secondary to bolus timing, but a  vasculitis or reversible cerebral vasoconstriction syndrome (RCVS) is not excluded. Electronically Signed   By:  Merilyn Baba M.D.   On: 10/03/2022 20:05   CT HEAD CODE STROKE WO CONTRAST  Result Date: 10/03/2022 CLINICAL DATA:  Code stroke. Neuro deficit, acute, stroke suspected. Left-sided facial droop. Right-sided weakness. EXAM: CT HEAD WITHOUT CONTRAST TECHNIQUE: Contiguous axial images were obtained from the base of the skull through the vertex without intravenous contrast. RADIATION DOSE REDUCTION: This exam was performed according to the departmental dose-optimization program which includes automated exposure control, adjustment of the mA and/or kV according to patient size and/or use of iterative reconstruction technique. COMPARISON:  Brain MRI 05/18/2007.  Head CT 05/18/2007. FINDINGS: Brain: Cerebral volume is normal. There is no acute intracranial hemorrhage. No demarcated cortical infarct. No extra-axial fluid collection. No evidence of an intracranial mass. No midline shift. Vascular: No hyperdense vessel. Skull: No fracture or aggressive osseous lesion. Sinuses/Orbits: No mass or acute finding within the imaged orbits. No significant paranasal sinus disease at the imaged levels. ASPECTS [facility] Stroke Program Early CT Score) - Ganglionic level infarction (caudate, lentiform nuclei, internal capsule, insula, M1-M3 cortex): 7 - Supraganglionic infarction (M4-M6 cortex): 3 Total score (0-10 with 10 being normal): 10 No evidence of an acute intracranial abnormality. These results were called by telephone at the time of interpretation on 10/03/2022 at 7:03 pm to provider ADAM CURATOLO , who verbally acknowledged these results. IMPRESSION: No evidence of an acute intracranial abnormality. ASPECTS is 10. Electronically Signed   By: Kellie Simmering D.O.   On: 10/03/2022 19:04     PHYSICAL EXAM  Temp:  [97.6 F (36.4 C)-98 F (36.7 C)] 97.9 F (36.6 C) (02/18 1200) Pulse Rate:  [57-83] 78 (02/18  1200) Resp:  [13-23] 21 (02/18 1300) BP: (107-137)/(63-96) 123/75 (02/18 1300) SpO2:  [91 %-100 %] 91 % (02/18 1200)  General - Well nourished, well developed, in no acute distress  Cardiovascular - Regular rhythm and rate.  Neuro - awake, alert, eyes open, orientated to age, place, time and people. No aphasia, fluent language without dysarthria today, following all simple commands. B/l eyes lateral gaze incomplete with diplopia, tracking bilaterally but cranial nerve VI palsy observed bilaterally, visual fields full, PERRL. Left mild facial droop, but symmetrical closing eyes.  Tongue midline BUEs against gravity with RUE drift but no pronation. BLEs against gravity with some weakness on the right side. Sensation decreased light touch on the right subjectively, b/l FTN intact grossly no ataxia but slow on action, gait not tested.    ASSESSMENT/PLAN Ms. TESSI SWEETING is a 47 y.o. female with history of recent COVID 2 weeks ago, migraine admitted for slurred speech, blurry vision, double vision, left facial droop during driving.  TNK given at med center [facility].  MRI was negative, suspect small brainstem stroke unable to be seen on MRI.  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 CIR depends on home supervision Disposition: Pending  Hypertension Stable BP goal less than 180/105  Long term BP goal normotensive  Hyperlipidemia Home meds:  None LDL 83,  goal < 70 Now on Lipitor 20, no high intensity statin given LDL near goal Continue statin at discharge  Other Stroke Risk Factors   Other Active Problems Migraine on Topamax Recent COVID infection  Hospital day # 2  Patient seen by NP and then by MD, MD to edit note is needed [place] , MSN, AGACNP-BC [facility] See Amion for schedule and pager information 10/05/2022 1:22 PM  ATTENDING NOTE: I reviewed above note and agree with the assessment and plan. Pt was seen and examined.   Boyfriend is at the bedside. Pt sitting in chair, more interactive today, still has disconjugated eyes and sometime crossed eyes. B/l CN VI partial palsy improving but still not resolved yet. MRI overnight negative but likely DWI-negative infarct. MRA negative, and considered CTA reported changes probably artifact. Will put on DAPT and continue statin. PT/OT recommend HH vs. CIR depends on home supervision.   For detailed assessment and plan, please refer to above/below as I have made changes wherever appropriate.   Rosalin Hawking, MD PhD Stroke Neurology 10/05/2022 2:16 PM  This patient is critically ill due to stroke status post TNK and at significant risk of neurological worsening, death form recurrent stroke, hemorrhagic transformation, bleeding from TNK. This patient's care requires constant monitoring of vital signs, hemodynamics, respiratory and cardiac monitoring, review of multiple databases, neurological assessment, discussion with family, other specialists and medical decision making of high complexity. I spent 35 minutes of neurocritical care time in the care of this patient.     To contact Stroke Continuity provider, please refer to [email or web address] After hours, contact General Neurology
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
## 2022-10-05 NOTE — Discharge Summary (Shared)
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   Active Problems: HTN HLD Migraine on Topamax Recent COVID infection Hypokalemia   Medications to be continued on Rehab Allergies as of 10/08/2022       Reactions   Ketorolac Tromethamine Other (See Comments)   "felt like whole body was on fire"   Penicillins Other (See Comments)   Mother said she had a rash   Celebrex [celecoxib] Itching   itching        Medication List     STOP taking these medications    aspirin-acetaminophen-caffeine 250-250-65 MG tablet Commonly known as: EXCEDRIN MIGRAINE   levonorgestrel 20 MCG/24HR IUD Commonly known as: MIRENA   melatonin 3 MG Tabs tablet   norethindrone 5 MG tablet Commonly known as: AYGESTIN   SUMAtriptan 100 MG tablet Commonly known as: Imitrex   Wegovy 0.25 MG/0.5ML Soaj Generic drug: Semaglutide-Weight Management       TAKE these medications    aspirin 81 MG chewable tablet Chew 1 tablet (81 mg total) by mouth daily. Start taking on: October 09, 2022   atorvastatin 20 MG tablet Commonly known as: LIPITOR Take 1 tablet (20 mg total) by mouth daily. Start taking on: October 09, 2022   buPROPion 300 MG 24 hr tablet Commonly known as: Wellbutrin XL Take 1 tablet (300 mg total) by mouth daily.   clopidogrel 75 MG tablet Commonly known as: PLAVIX Take 1 tablet (75 mg total) by mouth daily. Start taking on: October 09, 2022   enoxaparin 40 MG/0.4ML injection Commonly known as: LOVENOX Inject 0.4 mLs (40 mg total) into the skin daily.   fluticasone 50 MCG/ACT nasal spray Commonly known as: FLONASE Place 2 sprays into both  nostrils as needed for allergies.   loratadine 10 MG tablet Commonly known as: CLARITIN Take 10 mg by mouth daily as needed for allergies.   Magnesium 250 MG Tabs Take 250 mg by mouth daily.   thyroid 180 MG tablet Commonly known as: ARMOUR Take 1 tablet (180 mg total) by mouth daily. Start taking on: October 09, 2022 What changed:  medication strength how much to take Another medication with the same name was removed. Continue taking this medication, and follow the directions you see here.   topiramate 25 MG tablet Commonly known as: TOPAMAX Take 3 tablets (75 mg total) by mouth at bedtime.   Vitamin D (Ergocalciferol) 1.25 MG (50000 UNIT) Caps capsule Commonly known as: DRISDOL Take 1 capsule (50,000 Units total) by mouth every 7 (seven) days.        LABORATORY STUDIES CBC    Component Value Date/Time   WBC 3.8 (L) 10/08/2022 0817   RBC 4.38 10/08/2022 0817   HGB 11.4 (L) 10/08/2022 0817   HGB 11.5 05/15/2022 0845   HCT 36.6 10/08/2022 0817   HCT 35.7 05/15/2022 0845   PLT 171 10/08/2022 0817   PLT 180 05/15/2022 0845   MCV 83.6 10/08/2022 0817   MCV 84 05/15/2022 0845   MCH 26.0 10/08/2022 0817   MCHC 31.1 10/08/2022 0817   RDW 13.9 10/08/2022 0817   RDW 13.2 05/15/2022 0845   LYMPHSABS 3.0 10/03/2022 1850  LYMPHSABS 2.3 05/15/2022 0845   MONOABS 0.5 10/03/2022 1850   EOSABS 0.1 10/03/2022 1850   EOSABS 0.1 05/15/2022 0845   BASOSABS 0.0 10/03/2022 1850   BASOSABS 0.0 05/15/2022 0845   CMP    Component Value Date/Time   NA 137 10/08/2022 0817   NA 143 05/15/2022 0845   K 3.8 10/08/2022 0817   CL 107 10/08/2022 0817   CO2 22 10/08/2022 0817   GLUCOSE 123 (H) 10/08/2022 0817   BUN 7 10/08/2022 0817   BUN 9 05/15/2022 0845   CREATININE 0.66 10/08/2022 0817   CALCIUM 9.1 10/08/2022 0817   PROT 8.0 10/03/2022 1850   PROT 7.4 05/15/2022 0845   ALBUMIN 4.2 10/03/2022 1850   ALBUMIN 4.5 05/15/2022 0845   AST 23 10/03/2022 1850   ALT 13 10/03/2022  1850   ALKPHOS 69 10/03/2022 1850   BILITOT 0.6 10/03/2022 1850   BILITOT 0.6 05/15/2022 0845   GFRNONAA >60 10/08/2022 0817   GFRAA 129 04/27/2020 1503   COAGS Lab Results  Component Value Date   INR 1.1 10/03/2022   Lipid Panel    Component Value Date/Time   CHOL 129 10/04/2022 0244   CHOL 160 05/15/2022 0845   TRIG 28 10/04/2022 0244   HDL 40 (L) 10/04/2022 0244   HDL 66 05/15/2022 0845   CHOLHDL 3.2 10/04/2022 0244   VLDL 6 10/04/2022 0244   LDLCALC 83 10/04/2022 0244   LDLCALC 84 05/15/2022 0845   HgbA1C  Lab Results  Component Value Date   HGBA1C 5.0 10/04/2022   Urinalysis    Component Value Date/Time   COLORURINE YELLOW 10/24/2019 1855   APPEARANCEUR CLEAR 10/24/2019 1855   LABSPEC 1.005 05/14/2022 1508   PHURINE 5.0 10/24/2019 1855   GLUCOSEU NEGATIVE 10/24/2019 1855   HGBUR NEGATIVE 10/24/2019 1855   BILIRUBINUR negative 05/14/2022 1508   KETONESUR negative 05/14/2022 1508   KETONESUR 20 (A) 10/24/2019 1855   PROTEINUR negative 05/14/2022 1508   PROTEINUR NEGATIVE 10/24/2019 1855   UROBILINOGEN 0.2 04/04/2021 1014   NITRITE Negative 05/14/2022 1508   NITRITE NEGATIVE 10/24/2019 1855   LEUKOCYTESUR Trace (A) 05/14/2022 1508   LEUKOCYTESUR NEGATIVE 10/24/2019 1855   Urine Drug Screen     Component Value Date/Time   LABOPIA POSITIVE (A) 10/04/2022 1019   COCAINSCRNUR NONE DETECTED 10/04/2022 1019   LABBENZ NONE DETECTED 10/04/2022 1019   AMPHETMU NONE DETECTED 10/04/2022 1019   THCU NONE DETECTED 10/04/2022 1019   LABBARB NONE DETECTED 10/04/2022 1019    Alcohol Level    Component Value Date/Time   ETH <10 10/03/2022 1850     SIGNIFICANT DIAGNOSTIC STUDIES VAS [place] TRANSCRANIAL DOPPLER W BUBBLES  Result Date: 10/08/2022  Transcranial Doppler with Bubble Patient Name:  Donna Mccarthy  Date of Exam:   10/08/2022 Medical Rec #: [id number]           Accession #:    [id number] Date of Birth: 05-03-1976            Patient Gender: F Patient Age:    47 years Exam Location:  [facility] Procedure:      VAS [place] TRANSCRANIAL DOPPLER W BUBBLES Referring Phys: Cornelius Moras XU --------------------------------------------------------------------------------  Indications: Stroke. Comparison Study: Echo TEE on 10-07-2022 Performing Technologist: Darlin Coco RDMS, RVT  Examination Guidelines: A complete evaluation includes B-mode imaging, spectral Doppler, color Doppler, and power Doppler as needed of all accessible portions of each vessel. Bilateral testing is considered an integral part of a complete examination. Limited examinations for  reoccurring indications may be performed as noted.  Summary:  A vascular evaluation was performed. The left middle cerebral artery was studied. An IV was inserted into the patient's right wrist. Verbal informed consent was obtained.  No HITS at rest or during Valsalva. Negative transcranial Doppler Bubble study with no evidence of right to left intracardiac or intrapulmonary communication.  *See table(s) above for TCD measurements and observations.  Diagnosing physician: Rosalin Hawking MD Electronically signed by Rosalin Hawking MD on 10/08/2022 at 2:44:22 PM.    Final    ECHO TEE  Result Date: 10/07/2022    TRANSESOPHOGEAL ECHO REPORT   Patient Name:   KESSLER AGATE Date of Exam: 10/07/2022 Medical Rec #:  [id number]          Height:       65.0 in Accession #:    [id number]         Weight:       181.4 lb Date of Birth:  12/12/1975           BSA:          1.898 m Patient Age:    76 years           BP:           100/83 mmHg Patient Gender: F                  HR:           94 bpm. Exam Location:  Inpatient Procedure: Transesophageal Echo, Color Doppler, Cardiac Doppler and Saline            Contrast Bubble Study Indications:     stroke  History:         Patient has prior history of Echocardiogram examinations, most                  recent 10/04/2022.  Sonographer:     Johny Chess RDCS Referring Phys:  [id number] Tami Lin [facility]  Diagnosing Phys: Gwyndolyn Kaufman MD PROCEDURE: After discussion of the risks and benefits of a TEE, an informed consent was obtained from the patient. The transesophogeal probe was passed without difficulty through the esophogus of the patient. Imaged were obtained with the patient in a left lateral decubitus position. Sedation performed by different physician. The patient was monitored while under deep sedation. Anesthestetic sedation was provided intravenously by Anesthesiology: 762m of Propofol. The patient developed no complications during the procedure.  IMPRESSIONS  1. Left ventricular ejection fraction, by estimation, is 60 to 65%. The left ventricle has normal function.  2. Right ventricular systolic function is normal. The right ventricular size is normal.  3. No left atrial/left atrial appendage thrombus was detected.  4. The mitral valve is normal in structure. Trivial mitral valve regurgitation.  5. The aortic valve is tricuspid. Aortic valve regurgitation is not visualized. No aortic stenosis is present.  6. The atrial septum is redundant. No interatrial shunting detected by color doppler. Multiple agitated saline contrast bubble studies performed. The majority of bubbles were seen late suggestive of intrapulmonary shunting. There were a small number of bubbles seen early during the study suggestive of a small PFO. Recommend transcranial doppler for further evaluation. FINDINGS  Left Ventricle: Left ventricular ejection fraction, by estimation, is 60 to 65%. The left ventricle has normal function. The left ventricular internal cavity size was normal in size. Right Ventricle: The right ventricular size is normal. No increase in right ventricular wall thickness. Right ventricular systolic function  is normal. Left Atrium: Left atrial size was normal in size. No left atrial/left atrial appendage thrombus was detected. Right Atrium: Right atrial size was normal in size. Pericardium: There is no evidence  of pericardial effusion. Mitral Valve: The mitral valve is normal in structure. Trivial mitral valve regurgitation. Tricuspid Valve: The tricuspid valve is normal in structure. Tricuspid valve regurgitation is trivial. No evidence of tricuspid stenosis. Aortic Valve: The aortic valve is tricuspid. Aortic valve regurgitation is not visualized. No aortic stenosis is present. Pulmonic Valve: The pulmonic valve was normal in structure. Pulmonic valve regurgitation is not visualized. Aorta: The aortic root is normal in size and structure. IAS/Shunts: The atrial septum is redundant. No interatrial shunting detected by color doppler. Multiple agitated saline contrast bubble studies performed. The majority of bubbles were seen late suggestive of intrapulmonary shunting. There were a small number of bubbles seen early during the study suggestive of a small PFO. Recommend transcranial doppler for further evaluation. Agitated saline contrast was given intravenously to evaluate for intracardiac shunting. Gwyndolyn Kaufman MD Electronically signed by Gwyndolyn Kaufman MD Signature Date/Time: 10/07/2022/4:25:23 PM    Final    ECHOCARDIOGRAM COMPLETE  Result Date: 10/05/2022    ECHOCARDIOGRAM REPORT   Patient Name:   WRIGLEY REISIG Date of Exam: 10/04/2022 Medical Rec #:  [id number]          Height:       65.0 in Accession #:    [id number]         Weight:       178.6 lb Date of Birth:  12-08-1975           BSA:          1.885 m Patient Age:    72 years           BP:           135/86 mmHg Patient Gender: F                  HR:           72 bpm. Exam Location:  [facility] Procedure: 2D Echo, Cardiac Doppler and Color Doppler Indications:    Stroke I63.9  History:        Patient has prior history of Echocardiogram examinations, most                 recent 11/09/2019. Stroke. GERD.  Sonographer:    Alvino Chapel RCS Referring Phys: [id number] Gordon  1. Left ventricular ejection fraction, by estimation, is 60 to  65%. The left ventricle has normal function. The left ventricle has no regional wall motion abnormalities. Left ventricular diastolic parameters were normal.  2. Right ventricular systolic function is normal. The right ventricular size is normal.  3. The mitral valve is normal in structure. Trivial mitral valve regurgitation. No evidence of mitral stenosis.  4. The aortic valve is tricuspid. Aortic valve regurgitation is not visualized. No aortic stenosis is present.  5. The inferior vena cava is dilated in size with >50% respiratory variability, suggesting right atrial pressure of 8 mmHg. FINDINGS  Left Ventricle: Left ventricular ejection fraction, by estimation, is 60 to 65%. The left ventricle has normal function. The left ventricle has no regional wall motion abnormalities. The left ventricular internal cavity size was normal in size. There is  no left ventricular hypertrophy. Left ventricular diastolic parameters were normal. Right Ventricle: The right ventricular size is normal. Right ventricular systolic function is normal. Left Atrium:  Left atrial size was normal in size. Right Atrium: Right atrial size was normal in size. Pericardium: There is no evidence of pericardial effusion. Mitral Valve: The mitral valve is normal in structure. Trivial mitral valve regurgitation. No evidence of mitral valve stenosis. MV peak gradient, 96.8 mmHg. The mean mitral valve gradient is 64.0 mmHg. Tricuspid Valve: The tricuspid valve is normal in structure. Tricuspid valve regurgitation is trivial. No evidence of tricuspid stenosis. Aortic Valve: The aortic valve is tricuspid. Aortic valve regurgitation is not visualized. No aortic stenosis is present. Pulmonic Valve: The pulmonic valve was normal in structure. Pulmonic valve regurgitation is not visualized. No evidence of pulmonic stenosis. Aorta: The aortic root is normal in size and structure. Venous: The inferior vena cava is dilated in size with greater than 50%  respiratory variability, suggesting right atrial pressure of 8 mmHg. IAS/Shunts: No atrial level shunt detected by color flow Doppler.  LEFT VENTRICLE PLAX 2D LVIDd:         4.70 cm   Diastology LVIDs:         2.80 cm   LV e' medial:    12.20 cm/s LV PW:         0.80 cm   LV E/e' medial:  8.1 LV IVS:        0.90 cm   LV e' lateral:   17.10 cm/s LVOT diam:     2.00 cm   LV E/e' lateral: 5.8 LV SV:         80 LV SV Index:   43 LVOT Area:     3.14 cm  RIGHT VENTRICLE RV S prime:     18.20 cm/s TAPSE (M-mode): 2.5 cm LEFT ATRIUM             Index        RIGHT ATRIUM           Index LA diam:        3.60 cm 1.91 cm/m   RA Area:     13.00 cm LA Vol (A2C):   58.1 ml 30.82 ml/m  RA Volume:   32.90 ml  17.45 ml/m LA Vol (A4C):   45.6 ml 24.19 ml/m LA Biplane Vol: 53.8 ml 28.54 ml/m  AORTIC VALVE LVOT Vmax:   131.00 cm/s LVOT Vmean:  81.300 cm/s LVOT VTI:    0.256 m  AORTA Ao Root diam: 2.90 cm MITRAL VALVE MV Area (PHT): 2.45 cm    SHUNTS MV Area VTI:   0.53 cm    Systemic VTI:  0.26 m MV Peak grad:  96.8 mmHg   Systemic Diam: 2.00 cm MV Mean grad:  64.0 mmHg MV Vmax:       4.92 m/s MV Vmean:      381.0 cm/s MV Decel Time: 310 msec MV E velocity: 99.00 cm/s MV A velocity: 97.10 cm/s MV E/A ratio:  1.02 Kirk Ruths MD Electronically signed by Kirk Ruths MD Signature Date/Time: 10/05/2022/11:17:03 AM    Final    MR BRAIN WO CONTRAST  Result Date: 10/04/2022 CLINICAL DATA:  Stroke-like symptoms EXAM: MRI HEAD WITHOUT CONTRAST MRA HEAD WITHOUT CONTRAST TECHNIQUE: Multiplanar, multi-echo pulse sequences of the brain and surrounding structures were acquired without intravenous contrast. Angiographic images of the Circle of Willis were acquired using MRA technique without intravenous contrast. COMPARISON:  05/18/2007 MRI head, correlation is made with CTA head and neck 10/03/2022 FINDINGS: MRI HEAD FINDINGS Brain: No restricted diffusion to suggest acute or subacute infarct. No acute hemorrhage, mass, mass  effect,  or midline shift. No hydrocephalus or extra-axial collection. No hemosiderin deposition to suggest remote hemorrhage. Cerebral volume is normal for age. Vascular: Normal arterial flow voids. Please see MRA findings below. Skull and upper cervical spine: Normal marrow signal. Sinuses/Orbits: Clear paranasal sinuses. No acute finding in the orbits. Other: The mastoids are well aerated. MRA HEAD FINDINGS Anterior circulation: Both internal carotid arteries are patent to the termini, without significant stenosis. A1 segments patent. Normal anterior communicating artery. Anterior cerebral arteries are patent to their distal aspects. No M1 stenosis or occlusion. Normal MCA bifurcations. Distal MCA branches perfused and symmetric. Posterior circulation: Vertebral arteries patent to the vertebrobasilar junction without stenosis. Posterior inferior cerebral arteries patent bilaterally. Basilar patent to its distal aspect. Superior cerebellar arteries patent bilaterally. Patent P1 segments. PCAs perfused to their distal aspects without stenosis. The bilateral posterior communicating arteries are patent. Anatomic variants: None significant IMPRESSION: 1. No acute intracranial process. No evidence of acute or subacute infarct. 2. No intracranial large vessel occlusion or significant stenosis. Previously seen narrowing of the MCAs and PCAs on the CTA is not apparent on this exam and may have been secondary to poor vascular opacification. Electronically Signed   By: Merilyn Baba M.D.   On: 10/04/2022 21:55   MR ANGIO HEAD WO CONTRAST  Result Date: 10/04/2022 CLINICAL DATA:  Stroke-like symptoms EXAM: MRI HEAD WITHOUT CONTRAST MRA HEAD WITHOUT CONTRAST TECHNIQUE: Multiplanar, multi-echo pulse sequences of the brain and surrounding structures were acquired without intravenous contrast. Angiographic images of the Circle of Willis were acquired using MRA technique without intravenous contrast. COMPARISON:  05/18/2007 MRI head,  correlation is made with CTA head and neck 10/03/2022 FINDINGS: MRI HEAD FINDINGS Brain: No restricted diffusion to suggest acute or subacute infarct. No acute hemorrhage, mass, mass effect, or midline shift. No hydrocephalus or extra-axial collection. No hemosiderin deposition to suggest remote hemorrhage. Cerebral volume is normal for age. Vascular: Normal arterial flow voids. Please see MRA findings below. Skull and upper cervical spine: Normal marrow signal. Sinuses/Orbits: Clear paranasal sinuses. No acute finding in the orbits. Other: The mastoids are well aerated. MRA HEAD FINDINGS Anterior circulation: Both internal carotid arteries are patent to the termini, without significant stenosis. A1 segments patent. Normal anterior communicating artery. Anterior cerebral arteries are patent to their distal aspects. No M1 stenosis or occlusion. Normal MCA bifurcations. Distal MCA branches perfused and symmetric. Posterior circulation: Vertebral arteries patent to the vertebrobasilar junction without stenosis. Posterior inferior cerebral arteries patent bilaterally. Basilar patent to its distal aspect. Superior cerebellar arteries patent bilaterally. Patent P1 segments. PCAs perfused to their distal aspects without stenosis. The bilateral posterior communicating arteries are patent. Anatomic variants: None significant IMPRESSION: 1. No acute intracranial process. No evidence of acute or subacute infarct. 2. No intracranial large vessel occlusion or significant stenosis. Previously seen narrowing of the MCAs and PCAs on the CTA is not apparent on this exam and may have been secondary to poor vascular opacification. Electronically Signed   By: Merilyn Baba M.D.   On: 10/04/2022 21:55   CT ANGIO HEAD NECK W WO CM (CODE STROKE)  Result Date: 10/03/2022 CLINICAL DATA:  Stroke-like symptoms EXAM: CT ANGIOGRAPHY HEAD AND NECK TECHNIQUE: Multidetector CT imaging of the head and neck was performed using the standard  protocol during bolus administration of intravenous contrast. Multiplanar CT image reconstructions and MIPs were obtained to evaluate the vascular anatomy. Carotid stenosis measurements (when applicable) are obtained utilizing NASCET criteria, using the distal internal carotid diameter as the  denominator. RADIATION DOSE REDUCTION: This exam was performed according to the departmental dose-optimization program which includes automated exposure control, adjustment of the mA and/or kV according to patient size and/or use of iterative reconstruction technique. CONTRAST:  59m OMNIPAQUE IOHEXOL 350 MG/ML SOLN COMPARISON:  No prior CTA available, correlation is made with CT head 10/03/2022 FINDINGS: CT HEAD FINDINGS For noncontrast findings, please see same day CT head. CTA NECK FINDINGS Aortic arch: Standard branching. Imaged portion shows no evidence of aneurysm or dissection. No significant stenosis of the major arch vessel origins. Right carotid system: No evidence of stenosis, dissection, or occlusion. Left carotid system: No evidence of stenosis, dissection, or occlusion. Vertebral arteries: No evidence of stenosis, dissection, or occlusion. Skeleton: No acute osseous abnormality. Other neck: No acute finding. Upper chest: No focal pulmonary opacity or pleural effusion. Review of the MIP images confirms the above findings CTA HEAD FINDINGS Evaluation is somewhat limited by bolus timing. Anterior circulation: Both internal carotid arteries are patent to the termini, without significant stenosis. A1 segments patent. Normal anterior communicating artery. Anterior cerebral arteries are patent to their distal aspects. The bilateral MCAs appear somewhat narrow, without evidence of downstream dilatation. No focal M1 stenosis or occlusion. MCA branches perfused and symmetric. Posterior circulation: Vertebral arteries patent to the vertebrobasilar junction without stenosis. Posterior inferior cerebellar arteries not well  seen. Basilar patent to its distal aspect. Superior cerebellar arteries patent proximally. Patent P1 segments. The bilateral PCAs appear somewhat narrow, without evidence of downstream dilatation, but appear to be perfused proximally. Evaluation of the distal PCAs is limited by venous contamination. Venous sinuses: Patent. Anatomic variants: None significant. Review of the MIP images confirms the above findings IMPRESSION: 1. No large vessel occlusion or significant stenosis in the neck. 2. The bilateral MCAs and PCAs appear somewhat narrow, without evidence of downstream dilatation, but appear to be perfused proximally. This could be secondary to bolus timing, but a vasculitis or reversible cerebral vasoconstriction syndrome (RCVS) is not excluded. Electronically Signed   By: AMerilyn BabaM.D.   On: 10/03/2022 20:05   CT HEAD CODE STROKE WO CONTRAST  Result Date: 10/03/2022 CLINICAL DATA:  Code stroke. Neuro deficit, acute, stroke suspected. Left-sided facial droop. Right-sided weakness. EXAM: CT HEAD WITHOUT CONTRAST TECHNIQUE: Contiguous axial images were obtained from the base of the skull through the vertex without intravenous contrast. RADIATION DOSE REDUCTION: This exam was performed according to the departmental dose-optimization program which includes automated exposure control, adjustment of the mA and/or kV according to patient size and/or use of iterative reconstruction technique. COMPARISON:  Brain MRI 05/18/2007.  Head CT 05/18/2007. FINDINGS: Brain: Cerebral volume is normal. There is no acute intracranial hemorrhage. No demarcated cortical infarct. No extra-axial fluid collection. No evidence of an intracranial mass. No midline shift. Vascular: No hyperdense vessel. Skull: No fracture or aggressive osseous lesion. Sinuses/Orbits: No mass or acute finding within the imaged orbits. No significant paranasal sinus disease at the imaged levels. ASPECTS ([facility]) -  Ganglionic level infarction (caudate, lentiform nuclei, internal capsule, insula, M1-M3 cortex): 7 - Supraganglionic infarction (M4-M6 cortex): 3 Total score (0-10 with 10 being normal): 10 No evidence of an acute intracranial abnormality. These results were called by telephone at the time of interpretation on 10/03/2022 at 7:03 pm to provider ADAM CURATOLO , who verbally acknowledged these results. IMPRESSION: No evidence of an acute intracranial abnormality. ASPECTS is 10. Electronically Signed   By: KKellie SimmeringD.O.   On: 10/03/2022 19:04  HISTORY OF PRESENT ILLNESS Patient with history of recent COVID infection and migraines was admitted with slurred speech, blurred vision, diplopia and left facial droop.  HOSPITAL COURSE Patient was given TNK at outside hospital and transferred here.  No stroke was seen on MRI, and patient likely has a small brainstem stroke given continued deficits.  No LVO or significant stenosis seen on MRA.  Stroke: Possible brainstem infarct secondary to small vessel disease source CT no acute abnormality CT head and neck questionable bilateral MCA and PCA narrowing MRI: Negative for acute process MRA: No LVO or significant stenosis 2D Echo: Left ventricular ejection fraction 60 to 65%, left ventricular diastolic parameters normal, trivial mitral valve regurgitation, no left ventricular hypertrophy trivial tricuspid valve regurgitation. TEE: Left ventricular ejection fraction 60 to 65%, no left atrial or left atrial appendage thrombus detected, no evidence of pericardial effusion, trivial mitral valve regurgitation, trivial tricuspid valve regurgitation no aortic valve regurgitation seen no pulmonic valve regurgitation seen no interatrial shunting detected by color Doppler. TCD bubble study no right-to-left shunt LDL 83 HgbA1c 5.0 UDS positive for opiates SCD's for VTE prophylaxis No antithrombotic prior to admission, now on aspirin 81 mg daily and Plavix 75 mg  daily DAPT for 3 weeks and then ASA alone  Ongoing aggressive stroke risk factor management Therapy recommendations: Pending Disposition:CIR   Hypertension Stable Long term BP goal normotensive   Hyperlipidemia Home meds: None LDL 83, goal < 70 Now on Lipitor 20, no high intensity statin given LDL near goal Continue statin at discharge     Other Active Problems Migraine on Topamax Recent COVID infection Hypokalemia, resolved  DISCHARGE EXAM Blood pressure (!) 96/57, pulse 86, temperature (!) 97.3 F (36.3 C), temperature source Oral, resp. rate 16, height 5' 5"$  (1.651 m), weight 82.3 kg, last menstrual period 09/22/2022, SpO2 100 %.  General - Well nourished, well developed, in no acute distress, sitting in chair.  Cardiovascular - Regular rhythm and rate.   Neuro - awake, alert, eyes open, orientated to age, place, time and people.  Fluent, without hesitancy speech present on exam today. No aphasia, no dysarthria today, following all simple and 2 step commands.  B/l eyes lateral gaze incomplete with diplopia, significantly improved over time, tracking bilaterally but cranial nerve VI palsy observed but only mild right now, PERRL. Left subtle facial droop, but symmetrical closing eyes.  Tongue midline. BUEs against gravity with no pronator drift seen bilaterally. BLEs against gravity with some weakness on the right side. Sensation improved and symmetrical, b/l FTN intact grossly no ataxia but slow on action, gait not tested.   Discharge Diet      Diet   Diet regular Room service appropriate? Yes; Fluid consistency: Thin   liquids  DISCHARGE PLAN Disposition:  Transfer to [facility] for ongoing PT, OT and ST aspirin 81 mg daily and clopidogrel 75 mg daily for secondary stroke prevention for 3 weeks then aspirin alone. Recommend ongoing stroke risk factor control by Primary Care Physician at time of discharge from inpatient rehabilitation. Follow-up PCP Rita Ohara, MD in 2 weeks following discharge from rehab. Follow-up in [facility] in 4 weeks following discharge from rehab, office to schedule an appointment.   45 minutes were spent preparing discharge.   Pt seen by Neuro NP/APP and later by MD. Note/plan to be edited by MD as needed.    Otelia Santee, DNP, AGACNP-BC [facility] Please use AMION for pager and EPIC for messaging  ATTENDING NOTE: I reviewed above note and agree with the assessment and plan. Pt was seen and examined.   Patient sitting in chair, awake alert, complaining of mild headache, stated not relieved by Tylenol, prescribed Fioricet as needed.  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  Moving all extremities.  TCD bubble study negative.  Continue DAPT and statin.  Ready for CIR placement.  Follow-up at [facility] in 4 weeks.  For detailed assessment and plan, please refer to above/below as I have made changes wherever appropriate.   Rosalin Hawking, MD PhD Stroke Neurology 10/08/2022 4:58 PM

## 2022-10-06 DIAGNOSIS — I639 Cerebral infarction, unspecified: Secondary | ICD-10-CM | POA: Diagnosis not present

## 2022-10-06 DIAGNOSIS — G43919 Migraine, unspecified, intractable, without status migrainosus: Secondary | ICD-10-CM | POA: Diagnosis not present

## 2022-10-06 LAB — CBC
HCT: 34.8 % — ABNORMAL LOW (ref 36.0–46.0)
Hemoglobin: 11 g/dL — ABNORMAL LOW (ref 12.0–15.0)
MCH: 26.4 pg (ref 26.0–34.0)
MCHC: 31.6 g/dL (ref 30.0–36.0)
MCV: 83.5 fL (ref 80.0–100.0)
Platelets: 160 10*3/uL (ref 150–400)
RBC: 4.17 MIL/uL (ref 3.87–5.11)
RDW: 14.1 % (ref 11.5–15.5)
WBC: 3.6 10*3/uL — ABNORMAL LOW (ref 4.0–10.5)
nRBC: 0 % (ref 0.0–0.2)

## 2022-10-06 LAB — BASIC METABOLIC PANEL
Anion gap: 7 (ref 5–15)
BUN: 10 mg/dL (ref 6–20)
CO2: 23 mmol/L (ref 22–32)
Calcium: 8.9 mg/dL (ref 8.9–10.3)
Chloride: 107 mmol/L (ref 98–111)
Creatinine, Ser: 0.62 mg/dL (ref 0.44–1.00)
GFR, Estimated: >60 mL/min (ref >60)
Glucose, Bld: 96 mg/dL (ref 70–99)
Potassium: 3.4 mmol/L — ABNORMAL LOW (ref 3.5–5.1)
Sodium: 137 mmol/L (ref 135–145)

## 2022-10-06 MED ORDER — BUPROPION HCL ER (XL) 150 MG PO TB24
300.0000 mg | ORAL_TABLET | Freq: Every day | ORAL | Status: DC
  Administered 2022-10-06 – 2022-10-08 (×3): 300 mg via ORAL
  Filled 2022-10-06 (×2): qty 2
  Filled 2022-10-06: qty 1

## 2022-10-06 MED ORDER — SODIUM CHLORIDE 0.9 % IV SOLN: INTRAVENOUS | Status: AC

## 2022-10-06 MED ORDER — THYROID 60 MG PO TABS
180.0000 mg | ORAL_TABLET | Freq: Every day | ORAL | Status: DC
  Administered 2022-10-07 – 2022-10-08 (×2): 180 mg via ORAL
  Filled 2022-10-06 (×2): qty 3

## 2022-10-06 MED ORDER — POTASSIUM CHLORIDE CRYS ER 20 MEQ PO TBCR
40.0000 meq | EXTENDED_RELEASE_TABLET | Freq: Once | ORAL | Status: AC
  Administered 2022-10-06: 40 meq via ORAL
  Filled 2022-10-06: qty 2

## 2022-10-06 NOTE — Progress Notes (Signed)
   10/06/22 1000  Spiritual Encounters  Type of Visit Initial  Care provided to: Patient  Referral source Patient request  Reason for visit Routine spiritual support  OnCall Visit No  Spiritual Framework  Presenting Themes Significant life change;Impactful experiences and emotions  Values/beliefs independence; work  Community/Connection Family;Friend(s)  Needs/Challenges/Barriers encouragement; health to improve  Patient Stress Factors Health changes;Loss of control  Goals  Self/Personal Goals go back to work  Interventions  Spiritual Care Interventions Made Compassionate presence;Reflective listening;Normalization of emotions;Meaning making  Intervention Outcomes  Outcomes Reduced fear;Reduced anxiety   Ch responded to request for emotional and spiritual care. Pt is afraid that she will not be able to go back to work. She values her independence. Pt's family is the source of comfort and strength. No follow-up needed at this time.

## 2022-10-06 NOTE — Progress Notes (Signed)
Occupational Therapy Treatment Patient Details Name: Donna Mccarthy MRN: [id number] DOB: 05-25-1976 Today's Date: 10/06/2022   History of present illness 47 y.o. female who was driving [id number] and had sudden onset L facial droop, dysarthria, blurred vision. Drove herself to ED. given tnkase; NIHSS=5; CT and MRI negative. Neurology suspects ?brainstem CVA vs functional neurologic deficit.   PMH significant for GERD, hypothyroidism, obesity, recent covid   OT comments  Session focused on ADL re-training and bilateral hand coordination in relation to double vision. Pt reports that her double vision is more prominent when looking in the distance versus close. States that the right lens occluded on her glasses helps a little. Able to complete toileting and brushing teeth with min difficulty. Pt performs functional mobility with small and cautious steps due to vision. Pt continues to be appropriate for CIR at discharge. OT will continue to follow patient acutely.     Recommendations for follow up therapy are one component of a multi-disciplinary discharge planning process, led by the attending physician.  Recommendations may be updated based on patient status, additional functional criteria and insurance authorization.    Follow Up Recommendations  Acute inpatient rehab (3hours/day)     Assistance Recommended at Discharge Intermittent Supervision/Assistance  Patient can return home with the following  A little help with walking and/or transfers;A little help with bathing/dressing/bathroom;Assistance with cooking/housework;Assist for transportation;Help with stairs or ramp for entrance   Equipment Recommendations  Other (comment) (TBD)       Precautions / Restrictions Precautions Precautions: Fall Precaution Comments: double vision Restrictions Weight Bearing Restrictions: No       Mobility Bed Mobility Overal bed mobility:  (up in recliner upon therapy arrival)   Patient Response:  Cooperative  Transfers Overall transfer level: Needs assistance Equipment used: None Transfers: Sit to/from Stand, Bed to chair/wheelchair/BSC Sit to Stand: Min guard     Step pivot transfers: Min guard     General transfer comment: Pt took small and cautious steps forward with both arms in a protective position in front of her due to double vision.     Balance Overall balance assessment: Needs assistance Sitting-balance support: No upper extremity supported, Feet supported Sitting balance-Leahy Scale: Good     Standing balance support: During functional activity, No upper extremity supported Standing balance-Leahy Scale: Fair         ADL either performed or assessed with clinical judgement   ADL Overall ADL's : Needs assistance/impaired Eating/Feeding: Supervision/ safety;Sitting Eating/Feeding Details (indicate cue type and reason): Pt able to set up lunch tray without physical assist Grooming: Oral care;Supervision/safety;Standing;Wash/dry hands Grooming Details (indicate cue type and reason): Pt able to complete  entire task of brushing teeth including opening plastic wrap to retrieve toothbrush and applying toothpaste to toothbrush.     Toilet Transfer: Min guard;Ambulation;Regular Toilet   Toileting- Clothing Manipulation and Hygiene: Modified independent;Sitting/lateral lean         Vision Patient Visual Report: Diplopia Additional Comments: Provided pt education and handout on double vision including activities to help and discussed safety awareness. Pt verbalized understanding.          Cognition Arousal/Alertness: Awake/alert Behavior During Therapy: WFL for tasks assessed/performed Overall Cognitive Status: Within Functional Limits for tasks assessed              General Comments VSS    Pertinent Vitals/ Pain       Pain Assessment Pain Assessment: Faces Faces Pain Scale: No hurt  Frequency  Min 2X/week        Progress Toward  Goals  OT Goals(current goals can now be found in the care plan section)  Progress towards OT goals: Progressing toward goals     Plan Discharge plan remains appropriate;Frequency remains appropriate       AM-PAC OT "6 Clicks" Daily Activity     Outcome Measure   Help from another person eating meals?: A Little Help from another person taking care of personal grooming?: A Little Help from another person toileting, which includes using toliet, bedpan, or urinal?: None Help from another person bathing (including washing, rinsing, drying)?: A Little Help from another person to put on and taking off regular upper body clothing?: A Little Help from another person to put on and taking off regular lower body clothing?: A Lot 6 Click Score: 18    End of Session Equipment Utilized During Treatment: Gait belt  OT Visit Diagnosis: Unsteadiness on feet (R26.81);Muscle weakness (generalized) (M62.81);Other symptoms and signs involving the nervous system (R29.898);Hemiplegia and hemiparesis Hemiplegia - Right/Left: Right Hemiplegia - dominant/non-dominant: Dominant Hemiplegia - caused by: Unspecified (suspected brain stem infarct)   Activity Tolerance Patient tolerated treatment well   Patient Left in chair;with call bell/phone within reach;with chair alarm set           Time: [id number] OT Time Calculation (min): 30 min  Charges: OT General Charges $OT Visit: 1 Visit OT Treatments $Self Care/Home Management : 23-37 mins  Ailene Ravel, OTR/L,CBIS  Supplemental OT - [facility] and [facility] Secure Chat Preferred    [place] Sarinana, Clarene Duke 10/06/2022, 12:37 PM

## 2022-10-06 NOTE — Progress Notes (Addendum)
STROKE TEAM PROGRESS NOTE   SUBJECTIVE (INTERVAL HISTORY) Patient is sitting in the chair, just got back from the bathroom. RN is at the bedside.  Neurological exam is unchanged, with mild right side weakness, speech is hesitant  VSS. K is 3.4 and has been replaced will check in the morning.  Will check hypercoag panel  She is waiting for an available floor bed. Therapy is recommending CIR   OBJECTIVE Temp:  [97.8 F (36.6 C)-98.1 F (36.7 C)] 98 F (36.7 C) (02/19 1100) Pulse Rate:  [58-98] 85 (02/19 1200) Cardiac Rhythm: Normal sinus rhythm (02/19 0800) Resp:  [12-21] 21 (02/19 1200) BP: (102-123)/(62-97) 103/71 (02/19 1200) SpO2:  [96 %-100 %] 100 % (02/19 1200)  Recent Labs  Lab 10/03/22 1849  GLUCAP 87    Recent Labs  Lab 10/03/22 1850 10/05/22 0542 10/06/22 0358  NA 137 139 137  K 3.8 3.7 3.4*  CL 109 112* 107  CO2 22 20* 23  GLUCOSE 94 95 96  BUN 12 <5* 10  CREATININE 0.66 0.54 0.62  CALCIUM 8.9 8.9 8.9    Recent Labs  Lab 10/03/22 1850  AST 23  ALT 13  ALKPHOS 69  BILITOT 0.6  PROT 8.0  ALBUMIN 4.2    Recent Labs  Lab 10/03/22 1850 10/05/22 0542 10/06/22 0358  WBC 5.6 4.1 3.6*  NEUTROABS 1.9  --   --   HGB 11.5* 11.1* 11.0*  HCT 36.3 35.6* 34.8*  MCV 83.3 83.6 83.5  PLT 241 171 160    No results for input(s): "CKTOTAL", "CKMB", "CKMBINDEX", "TROPONINI" in the last 168 hours. Recent Labs    10/03/22 1850  LABPROT 14.1  INR 1.1    No results for input(s): "COLORURINE", "LABSPEC", "PHURINE", "GLUCOSEU", "HGBUR", "BILIRUBINUR", "KETONESUR", "PROTEINUR", "UROBILINOGEN", "NITRITE", "LEUKOCYTESUR" in the last 72 hours.  Invalid input(s): "APPERANCEUR"     Component Value Date/Time   CHOL 129 10/04/2022 0244   CHOL 160 05/15/2022 0845   TRIG 28 10/04/2022 0244   HDL 40 (L) 10/04/2022 0244   HDL 66 05/15/2022 0845   CHOLHDL 3.2 10/04/2022 0244   VLDL 6 10/04/2022 0244   LDLCALC 83 10/04/2022 0244   LDLCALC 84 05/15/2022 0845    Lab Results  Component Value Date   HGBA1C 5.0 10/04/2022      Component Value Date/Time   LABOPIA POSITIVE (A) 10/04/2022 1019   COCAINSCRNUR NONE DETECTED 10/04/2022 1019   LABBENZ NONE DETECTED 10/04/2022 1019   AMPHETMU NONE DETECTED 10/04/2022 1019   THCU NONE DETECTED 10/04/2022 1019   LABBARB NONE DETECTED 10/04/2022 1019    Recent Labs  Lab 10/03/22 1850  ETH <10     I have personally reviewed the radiological images below and agree with the radiology interpretations.  ECHOCARDIOGRAM COMPLETE  Result Date: 10/05/2022    ECHOCARDIOGRAM REPORT   Patient Name:   Donna Mccarthy Date of Exam: 10/04/2022 Medical Rec #:  [id number]          Height:       65.0 in Accession #:    [id number]         Weight:       178.6 lb Date of Birth:  03-05-1976           BSA:          1.885 m Patient Age:    47 years           BP:           135/86 mmHg  Patient Gender: F                  HR:           72 bpm. Exam Location:  [facility] Procedure: 2D Echo, Cardiac Doppler and Color Doppler Indications:    Stroke I63.9  History:        Patient has prior history of Echocardiogram examinations, most                 recent 11/09/2019. Stroke. GERD.  Sonographer:    Alvino Chapel RCS Referring Phys: [id number] [place]  1. Left ventricular ejection fraction, by estimation, is 60 to 65%. The left ventricle has normal function. The left ventricle has no regional wall motion abnormalities. Left ventricular diastolic parameters were normal.  2. Right ventricular systolic function is normal. The right ventricular size is normal.  3. The mitral valve is normal in structure. Trivial mitral valve regurgitation. No evidence of mitral stenosis.  4. The aortic valve is tricuspid. Aortic valve regurgitation is not visualized. No aortic stenosis is present.  5. The inferior vena cava is dilated in size with >50% respiratory variability, suggesting right atrial pressure of 8 mmHg. FINDINGS  Left Ventricle:  Left ventricular ejection fraction, by estimation, is 60 to 65%. The left ventricle has normal function. The left ventricle has no regional wall motion abnormalities. The left ventricular internal cavity size was normal in size. There is  no left ventricular hypertrophy. Left ventricular diastolic parameters were normal. Right Ventricle: The right ventricular size is normal. Right ventricular systolic function is normal. Left Atrium: Left atrial size was normal in size. Right Atrium: Right atrial size was normal in size. Pericardium: There is no evidence of pericardial effusion. Mitral Valve: The mitral valve is normal in structure. Trivial mitral valve regurgitation. No evidence of mitral valve stenosis. MV peak gradient, 96.8 mmHg. The mean mitral valve gradient is 64.0 mmHg. Tricuspid Valve: The tricuspid valve is normal in structure. Tricuspid valve regurgitation is trivial. No evidence of tricuspid stenosis. Aortic Valve: The aortic valve is tricuspid. Aortic valve regurgitation is not visualized. No aortic stenosis is present. Pulmonic Valve: The pulmonic valve was normal in structure. Pulmonic valve regurgitation is not visualized. No evidence of pulmonic stenosis. Aorta: The aortic root is normal in size and structure. Venous: The inferior vena cava is dilated in size with greater than 50% respiratory variability, suggesting right atrial pressure of 8 mmHg. IAS/Shunts: No atrial level shunt detected by color flow Doppler.  LEFT VENTRICLE PLAX 2D LVIDd:         4.70 cm   Diastology LVIDs:         2.80 cm   LV e' medial:    12.20 cm/s LV PW:         0.80 cm   LV E/e' medial:  8.1 LV IVS:        0.90 cm   LV e' lateral:   17.10 cm/s LVOT diam:     2.00 cm   LV E/e' lateral: 5.8 LV SV:         80 LV SV Index:   43 LVOT Area:     3.14 cm  RIGHT VENTRICLE RV S prime:     18.20 cm/s TAPSE (M-mode): 2.5 cm LEFT ATRIUM             Index        RIGHT ATRIUM           Index  LA diam:        3.60 cm 1.91 cm/m   RA  Area:     13.00 cm LA Vol (A2C):   58.1 ml 30.82 ml/m  RA Volume:   32.90 ml  17.45 ml/m LA Vol (A4C):   45.6 ml 24.19 ml/m LA Biplane Vol: 53.8 ml 28.54 ml/m  AORTIC VALVE LVOT Vmax:   131.00 cm/s LVOT Vmean:  81.300 cm/s LVOT VTI:    0.256 m  AORTA Ao Root diam: 2.90 cm MITRAL VALVE MV Area (PHT): 2.45 cm    SHUNTS MV Area VTI:   0.53 cm    Systemic VTI:  0.26 m MV Peak grad:  96.8 mmHg   Systemic Diam: 2.00 cm MV Mean grad:  64.0 mmHg MV Vmax:       4.92 m/s MV Vmean:      381.0 cm/s MV Decel Time: 310 msec MV E velocity: 99.00 cm/s MV A velocity: 97.10 cm/s MV E/A ratio:  1.02 Kirk Ruths MD Electronically signed by Kirk Ruths MD Signature Date/Time: 10/05/2022/11:17:03 AM    Final    MR BRAIN WO CONTRAST  Result Date: 10/04/2022 CLINICAL DATA:  Stroke-like symptoms EXAM: MRI HEAD WITHOUT CONTRAST MRA HEAD WITHOUT CONTRAST TECHNIQUE: Multiplanar, multi-echo pulse sequences of the brain and surrounding structures were acquired without intravenous contrast. Angiographic images of the Circle of Willis were acquired using MRA technique without intravenous contrast. COMPARISON:  05/18/2007 MRI head, correlation is made with CTA head and neck 10/03/2022 FINDINGS: MRI HEAD FINDINGS Brain: No restricted diffusion to suggest acute or subacute infarct. No acute hemorrhage, mass, mass effect, or midline shift. No hydrocephalus or extra-axial collection. No hemosiderin deposition to suggest remote hemorrhage. Cerebral volume is normal for age. Vascular: Normal arterial flow voids. Please see MRA findings below. Skull and upper cervical spine: Normal marrow signal. Sinuses/Orbits: Clear paranasal sinuses. No acute finding in the orbits. Other: The mastoids are well aerated. MRA HEAD FINDINGS Anterior circulation: Both internal carotid arteries are patent to the termini, without significant stenosis. A1 segments patent. Normal anterior communicating artery. Anterior cerebral arteries are patent to their  distal aspects. No M1 stenosis or occlusion. Normal MCA bifurcations. Distal MCA branches perfused and symmetric. Posterior circulation: Vertebral arteries patent to the vertebrobasilar junction without stenosis. Posterior inferior cerebral arteries patent bilaterally. Basilar patent to its distal aspect. Superior cerebellar arteries patent bilaterally. Patent P1 segments. PCAs perfused to their distal aspects without stenosis. The bilateral posterior communicating arteries are patent. Anatomic variants: None significant IMPRESSION: 1. No acute intracranial process. No evidence of acute or subacute infarct. 2. No intracranial large vessel occlusion or significant stenosis. Previously seen narrowing of the MCAs and PCAs on the CTA is not apparent on this exam and may have been secondary to poor vascular opacification. Electronically Signed   By: Merilyn Baba M.D.   On: 10/04/2022 21:55   MR ANGIO HEAD WO CONTRAST  Result Date: 10/04/2022 CLINICAL DATA:  Stroke-like symptoms EXAM: MRI HEAD WITHOUT CONTRAST MRA HEAD WITHOUT CONTRAST TECHNIQUE: Multiplanar, multi-echo pulse sequences of the brain and surrounding structures were acquired without intravenous contrast. Angiographic images of the Circle of Willis were acquired using MRA technique without intravenous contrast. COMPARISON:  05/18/2007 MRI head, correlation is made with CTA head and neck 10/03/2022 FINDINGS: MRI HEAD FINDINGS Brain: No restricted diffusion to suggest acute or subacute infarct. No acute hemorrhage, mass, mass effect, or midline shift. No hydrocephalus or extra-axial collection. No hemosiderin deposition to suggest remote hemorrhage. Cerebral volume is  normal for age. Vascular: Normal arterial flow voids. Please see MRA findings below. Skull and upper cervical spine: Normal marrow signal. Sinuses/Orbits: Clear paranasal sinuses. No acute finding in the orbits. Other: The mastoids are well aerated. MRA HEAD FINDINGS Anterior circulation:  Both internal carotid arteries are patent to the termini, without significant stenosis. A1 segments patent. Normal anterior communicating artery. Anterior cerebral arteries are patent to their distal aspects. No M1 stenosis or occlusion. Normal MCA bifurcations. Distal MCA branches perfused and symmetric. Posterior circulation: Vertebral arteries patent to the vertebrobasilar junction without stenosis. Posterior inferior cerebral arteries patent bilaterally. Basilar patent to its distal aspect. Superior cerebellar arteries patent bilaterally. Patent P1 segments. PCAs perfused to their distal aspects without stenosis. The bilateral posterior communicating arteries are patent. Anatomic variants: None significant IMPRESSION: 1. No acute intracranial process. No evidence of acute or subacute infarct. 2. No intracranial large vessel occlusion or significant stenosis. Previously seen narrowing of the MCAs and PCAs on the CTA is not apparent on this exam and may have been secondary to poor vascular opacification. Electronically Signed   By: Merilyn Baba M.D.   On: 10/04/2022 21:55   CT ANGIO HEAD NECK W WO CM (CODE STROKE)  Result Date: 10/03/2022 CLINICAL DATA:  Stroke-like symptoms EXAM: CT ANGIOGRAPHY HEAD AND NECK TECHNIQUE: Multidetector CT imaging of the head and neck was performed using the standard protocol during bolus administration of intravenous contrast. Multiplanar CT image reconstructions and MIPs were obtained to evaluate the vascular anatomy. Carotid stenosis measurements (when applicable) are obtained utilizing NASCET criteria, using the distal internal carotid diameter as the denominator. RADIATION DOSE REDUCTION: This exam was performed according to the departmental dose-optimization program which includes automated exposure control, adjustment of the mA and/or kV according to patient size and/or use of iterative reconstruction technique. CONTRAST:  22m OMNIPAQUE IOHEXOL 350 MG/ML SOLN COMPARISON:   No prior CTA available, correlation is made with CT head 10/03/2022 FINDINGS: CT HEAD FINDINGS For noncontrast findings, please see same day CT head. CTA NECK FINDINGS Aortic arch: Standard branching. Imaged portion shows no evidence of aneurysm or dissection. No significant stenosis of the major arch vessel origins. Right carotid system: No evidence of stenosis, dissection, or occlusion. Left carotid system: No evidence of stenosis, dissection, or occlusion. Vertebral arteries: No evidence of stenosis, dissection, or occlusion. Skeleton: No acute osseous abnormality. Other neck: No acute finding. Upper chest: No focal pulmonary opacity or pleural effusion. Review of the MIP images confirms the above findings CTA HEAD FINDINGS Evaluation is somewhat limited by bolus timing. Anterior circulation: Both internal carotid arteries are patent to the termini, without significant stenosis. A1 segments patent. Normal anterior communicating artery. Anterior cerebral arteries are patent to their distal aspects. The bilateral MCAs appear somewhat narrow, without evidence of downstream dilatation. No focal M1 stenosis or occlusion. MCA branches perfused and symmetric. Posterior circulation: Vertebral arteries patent to the vertebrobasilar junction without stenosis. Posterior inferior cerebellar arteries not well seen. Basilar patent to its distal aspect. Superior cerebellar arteries patent proximally. Patent P1 segments. The bilateral PCAs appear somewhat narrow, without evidence of downstream dilatation, but appear to be perfused proximally. Evaluation of the distal PCAs is limited by venous contamination. Venous sinuses: Patent. Anatomic variants: None significant. Review of the MIP images confirms the above findings IMPRESSION: 1. No large vessel occlusion or significant stenosis in the neck. 2. The bilateral MCAs and PCAs appear somewhat narrow, without evidence of downstream dilatation, but appear to be perfused  proximally. This could be  secondary to bolus timing, but a vasculitis or reversible cerebral vasoconstriction syndrome (RCVS) is not excluded. Electronically Signed   By: Merilyn Baba M.D.   On: 10/03/2022 20:05   CT HEAD CODE STROKE WO CONTRAST  Result Date: 10/03/2022 CLINICAL DATA:  Code stroke. Neuro deficit, acute, stroke suspected. Left-sided facial droop. Right-sided weakness. EXAM: CT HEAD WITHOUT CONTRAST TECHNIQUE: Contiguous axial images were obtained from the base of the skull through the vertex without intravenous contrast. RADIATION DOSE REDUCTION: This exam was performed according to the departmental dose-optimization program which includes automated exposure control, adjustment of the mA and/or kV according to patient size and/or use of iterative reconstruction technique. COMPARISON:  Brain MRI 05/18/2007.  Head CT 05/18/2007. FINDINGS: Brain: Cerebral volume is normal. There is no acute intracranial hemorrhage. No demarcated cortical infarct. No extra-axial fluid collection. No evidence of an intracranial mass. No midline shift. Vascular: No hyperdense vessel. Skull: No fracture or aggressive osseous lesion. Sinuses/Orbits: No mass or acute finding within the imaged orbits. No significant paranasal sinus disease at the imaged levels. ASPECTS [facility] Stroke Program Early CT Score) - Ganglionic level infarction (caudate, lentiform nuclei, internal capsule, insula, M1-M3 cortex): 7 - Supraganglionic infarction (M4-M6 cortex): 3 Total score (0-10 with 10 being normal): 10 No evidence of an acute intracranial abnormality. These results were called by telephone at the time of interpretation on 10/03/2022 at 7:03 pm to provider ADAM CURATOLO , who verbally acknowledged these results. IMPRESSION: No evidence of an acute intracranial abnormality. ASPECTS is 10. Electronically Signed   By: Kellie Simmering D.O.   On: 10/03/2022 19:04     PHYSICAL EXAM  Temp:  [97.8 F (36.6 C)-98.1 F (36.7 C)] 98 F  (36.7 C) (02/19 1100) Pulse Rate:  [58-98] 85 (02/19 1200) Resp:  [12-21] 21 (02/19 1200) BP: (102-123)/(62-97) 103/71 (02/19 1200) SpO2:  [96 %-100 %] 100 % (02/19 1200)  General - Well nourished, well developed, in no acute distress  Cardiovascular - Regular rhythm and rate.  Neuro - awake, alert, eyes open, orientated to age, place, time and people. She has hesitant speech today. No aphasia, no dysarthria today, following all simple commands. B/l eyes lateral gaze incomplete with diplopia, tracking bilaterally but cranial nerve VI palsy observed bilaterally, visual fields full, PERRL. Left mild facial droop, but symmetrical closing eyes.  Tongue midline BUEs against gravity with RUE drift but no pronation. BLEs against gravity with some weakness on the right side. Sensation decreased light touch on the right subjectively, b/l FTN intact grossly no ataxia but slow on action, gait not tested.    ASSESSMENT/PLAN Ms. Donna Mccarthy is a 47 y.o. female with history of recent COVID 2 weeks ago, migraine admitted for slurred speech, blurry vision, double vision, left facial droop during driving.  [facility]NK [facility] med [facility].  MRI was negative, suspect small brainstem stroke unable to be seen on MRI.  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 source CT no acute abnormality CT head and neck questionable bilateral MCA and PCA narrowing MRI no acute abnormality MRA no LVO or hemodynamically significant stenosis 2D Echo EF 60 to 65%, normal left atrial size, no atrial level shunt Given young age and lack of significant stroke risk factors, will do TEE for further stroke work up LDL 83 HgbA1c 5.0 Hypercoag lab pending UDS positive for opiates SCD's for VTE prophylaxis No antithrombotic prior to admission, now on aspirin 81 mg daily and Plavix 75 mg daily DAPT for  3 weeks and then ASA alone Hypercoag panel ordered  Ongoing aggressive stroke risk  factor management Therapy recommendations: CIR  Disposition: Pending  Hypertension Stable Long term BP goal normotensive  Hyperlipidemia Home meds: None LDL 83, goal < 70 Now on Lipitor 20, no high intensity statin given LDL near goal Continue statin at discharge  Other Active Problems Migraine on Topamax Recent COVID infection Hypokalemia K 3.4 replaced will recheck in am   Hospital day # 3  Patient seen by NP and then by MD, MD to edit note is needed  Beulah Gandy DNP, ACNPC-AG  [facility]  ATTENDING NOTE: I reviewed above note and agree with the assessment and plan. Pt was seen and examined.   Pt sitting in chair for lunch, no acute event overnight. On eyeglass occluder for diplopia. 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 Right sided weakness improving. PT/OT recommend CIR. On DAPT and statin.   For detailed assessment and plan, please refer to above/below as I have made changes wherever appropriate.   Rosalin Hawking, MD PhD Stroke Neurology 10/06/2022 10:06 PM    To contact Stroke Continuity provider, please refer to [email or web address] After hours, contact General Neurology

## 2022-10-06 NOTE — Progress Notes (Signed)
Stroke education given to pt. Specifically BEFAST, 911 activation, and discussed individual risk factors.  Holland Commons RN  Stroke Response

## 2022-10-06 NOTE — Progress Notes (Signed)
  Transition of Care [facility]) Screening Note   Patient Details  Name: Donna Mccarthy Date of Birth: [birth year]   Transition [facility]) CM/SW Contact:    Geralynn Ochs, LCSW Phone Number: 10/06/2022, 3:41 PM    Transition of Care Department [facility]) has reviewed patient, CIR workup in progress. We will continue to monitor patient advancement through interdisciplinary progression rounds. If new patient transition needs arise, please place a TOC consult.

## 2022-10-06 NOTE — Plan of Care (Signed)
  Problem: Ischemic Stroke/TIA Tissue Perfusion: Goal: Complications of ischemic stroke/TIA will be minimized Outcome: Progressing   Problem: Coping: Goal: Will verbalize positive feelings about self Outcome: Progressing Goal: Will identify appropriate support needs Outcome: Progressing   

## 2022-10-06 NOTE — Progress Notes (Signed)
Inpatient Rehab Coordinator Note:  I spoke with pt over the phone to discuss CIR recommendations and goals/expectations of CIR stay.  We reviewed 3 hrs/day of therapy, physician follow up, and average length of stay 2 weeks (dependent upon progress) with goals of mod I to supervision.  Pt states she may be able to arrange 24/7 between friends and her son who is away at college in [place].  I will start insurance auth today and f/u with pt tomorrow at bedside.   Shann Medal, PT, DPT Admissions Coordinator [phone] 10/06/22  3:52 PM

## 2022-10-06 NOTE — Progress Notes (Signed)
Inpatient Rehab Admissions Coordinator:  ? ?Per therapy recommendations,  patient was screened for CIR candidacy by Diona Peregoy, MS, CCC-SLP. At this time, Pt. Appears to be a a potential candidate for CIR. I will place   order for rehab consult per protocol for full assessment. Please contact me any with questions. ? ?Kaijah Abts, MS, CCC-SLP ?Rehab Admissions Coordinator  ?[phone] (celll) ?[phone] (office) ? ?

## 2022-10-06 NOTE — Progress Notes (Signed)
   Donna Mccarthy has been requested to perform a transesophageal echocardiogram on Donna Mccarthy for stroke.  After careful review of history and examination, the risks and benefits of transesophageal echocardiogram have been explained including risks of esophageal damage, perforation (1:10,000 risk), bleeding, pharyngeal hematoma as well as other potential complications associated with conscious sedation including aspiration, arrhythmia, respiratory failure and death. Alternatives to treatment were discussed, questions were answered. Patient is willing to proceed.   Pt uncle was present during the conversation. She is oriented to self, place, and situation. She is scheduled for TEE tomorrow 10/07/22 at 1400 with Dr. Johney Frame. NPO at MN please.  Tami Lin Lealand [place], [place]  10/06/2022 4:05 PM

## 2022-10-06 NOTE — Consult Note (Signed)
Physical Medicine and Rehabilitation Consult Reason for Consult:CVA Referring Physician: Erlinda Hong   HPI: Donna Mccarthy is a 47 y.o. female with a history of obesity and recent COVID who was driving and developed sudden onset of left-sided facial weakness dysarthria and blurred vision.  She presented to [facility] emergency department on 10/04/2022.  Patient definite gaze abnormalities.  She also presented with severe headaches particularly in the left frontal area.  MRI of the brain was negative for acute findings.  However, patient's neurological deficits have persisted and is felt that she suffered a small brainstem infarct not captured on the MRI.  Patient's right-sided motor and sensory deficits have persisted as well as her visual deficits.  She transferred with min guard assistance today and walked 80 feet minimal assistance with notable trunk instability.  She needs significant cues for use of her rolling walker.   Review of Systems  Constitutional:  Negative for chills and fever.  HENT:  Negative for hearing loss and tinnitus.   Eyes:  Positive for blurred vision and double vision.  Respiratory:  Negative for cough.   Cardiovascular:  Negative for chest pain and palpitations.  Gastrointestinal:  Negative for nausea and vomiting.  Genitourinary:  Negative for dysuria and urgency.  Skin:  Negative for rash.  Neurological:  Positive for tingling, tremors, sensory change, speech change, focal weakness and headaches.  Psychiatric/Behavioral:  Negative for depression and suicidal ideas.    Past Medical History:  Diagnosis Date   Allergy    Anemia    Anxiety    Arthritis    arthritis knees   Blood transfusion without reported diagnosis    GERD (gastroesophageal reflux disease)    Hypothyroidism    Neuromuscular disorder (HCC)    Obesity (BMI 30-39.9) 01/05/2019   Past Surgical History:  Procedure Laterality Date   BIOPSY  04/03/2020   Procedure: BIOPSY;  Surgeon: Harvel Quale, MD;  [facility];  Service: Gastroenterology;;  antral, body, distal esophagus, mid esophagus   BRACHIOPLASTY Bilateral 07/02/2020   Procedure: BILATERAL BRACHIOPLASTY WITH LIPOSUCTION;  Surgeon: [name], MD;  Location: [place];  Service: Plastics;  Laterality: Bilateral;   DILATION AND CURETTAGE OF UTERUS     ESOPHAGOGASTRODUODENOSCOPY (EGD) WITH PROPOFOL N/A 04/03/2020   Procedure: ESOPHAGOGASTRODUODENOSCOPY (EGD) WITH PROPOFOL;  Surgeon: Harvel Quale, MD;  Location: [facility];  Service: Gastroenterology;  Laterality: N/A;  11:15   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   LIPOSUCTION Bilateral 07/02/2020   Procedure: LIPOSUCTION;  Surgeon: [name], MD;  Location: [place];  Service: Plastics;  Laterality: Bilateral;   PANNICULECTOMY N/A 08/04/2019   Procedure: PANNICULECTOMY;  Surgeon: [name], MD;  Location: [place];  Service: Plastics;  Laterality: N/A;  2 hours only, please   Family History  Problem Relation Age of Onset   Hypertension Mother    Crohn's disease Mother    Colon polyps Mother    Hypertension Father        POTS   Stroke Father        TIA   Hyperlipidemia Father    Hypertension Brother    Breast cancer Maternal Grandmother    Diabetes Maternal Grandmother    Heart failure Maternal Grandmother    Thyroid disease Paternal Grandmother    Vitamin D deficiency Paternal Grandmother    Heart failure Paternal Grandfather    Heart attack Paternal Grandfather  Colon cancer Maternal Uncle        dx'd in 70's   Colon cancer Maternal Uncle 58   Breast cancer Maternal Aunt    Social History:  reports that she has never smoked. She has never used smokeless tobacco. She reports that she does not drink alcohol and does not use drugs. Allergies:  Allergies  Allergen Reactions   Ketorolac Tromethamine Other (See Comments)    "felt like whole body  was on fire"   Penicillins Other (See Comments)    Mother said she had a rash   Celebrex [Celecoxib] Itching    itching   Medications Prior to Admission  Medication Sig Dispense Refill   aspirin-acetaminophen-caffeine (EXCEDRIN MIGRAINE) 250-250-65 MG tablet Take 1 tablet by mouth as needed for headache.     buPROPion (WELLBUTRIN XL) 300 MG 24 hr tablet Take 1 tablet (300 mg total) by mouth daily. 90 tablet 0   fluticasone (FLONASE) 50 MCG/ACT nasal spray Place 2 sprays into both nostrils as needed for allergies.     levonorgestrel (MIRENA) 20 MCG/24HR IUD 1 each by Intrauterine route once.     loratadine (CLARITIN) 10 MG tablet Take 10 mg by mouth daily as needed for allergies.     Magnesium 250 MG TABS Take 250 mg by mouth daily.     melatonin 3 MG TABS tablet Take 6 mg by mouth at bedtime.     norethindrone (AYGESTIN) 5 MG tablet Take 2 tablets by mouth once daily (Patient taking differently: Take 10 mg by mouth as needed (heavy bleeding).) 30 tablet 1   SUMAtriptan (IMITREX) 100 MG tablet Take 1 tablet (100 mg total) by mouth once for 1 dose. May repeat in 2 hours if headache persists or recurs. (Patient taking differently: Take 100 mg by mouth every 2 (two) hours as needed for headache or migraine.) 10 tablet 1   thyroid (NP THYROID) 120 MG tablet Take 1 tablet (120 mg total) by mouth daily on an empty stomach. 30 tablet 5   thyroid (NP THYROID) 60 MG tablet Take 1 tablet by mouth on an empty stomach once a day 30 tablet 5   topiramate (TOPAMAX) 25 MG tablet Take 3 tablets (75 mg total) by mouth at bedtime. 270 tablet 3   Vitamin D, Ergocalciferol, (DRISDOL) 1.25 MG (50000 UNIT) CAPS capsule Take 1 capsule (50,000 Units total) by mouth every 7 (seven) days. 12 capsule 0   Semaglutide-Weight Management (WEGOVY) 0.25 MG/0.5ML SOAJ Inject 0.25 mg into the skin once a week. (Patient not taking: Reported on 08/25/2022) 2 mL 5    Home: Home Living Family/patient expects to be discharged to::  Private residence Living Arrangements: Alone Available Help at Discharge: Family, Friend(s), Available PRN/intermittently (?Electronics engineer (son) come home from school if 24/7 needed) Type of Home: House (townhouse) Home Access: Stairs to enter Technical brewer of Steps: 1 Home Layout: Two level, Bed/bath upstairs Alternate Level Stairs-Number of Steps: flight Bathroom Shower/Tub: Chiropodist: Standard Bathroom Accessibility: Yes Home Equipment: None Additional Comments: works as a Technical brewer for [employer] With: Alone  Functional History: Prior Function Prior Level of Function : Independent/Modified Independent, Driving, 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[employer]) Functional Status:  Mobility: Bed Mobility Overal bed mobility:  (up in recliner upon therapy arrival) Bed Mobility: Supine to Sit, Sit to Supine Supine to sit: Min guard, HOB elevated Sit to supine: Min assist (for RLE) General bed mobility comments: up in recliner upon therapy arrival Transfers Overall transfer level:  Needs assistance Equipment used: None, Rolling walker (2 wheels) Transfers: Sit to/from Stand Sit to Stand: Min guard Bed to/from chair/wheelchair/BSC transfer type:: Step pivot Step pivot transfers: Min guard General transfer comment: cues needed for hand placement with RW; with repetition pt was able to perform correctly Ambulation/Gait Ambulation/Gait assistance: Min assist Gait Distance (Feet): 80 Feet Assistive device: Rolling walker (2 wheels) Gait Pattern/deviations: Step-through pattern, Decreased stride length, Knees buckling, Decreased dorsiflexion - right General Gait Details: Pt with ?ataxia as she fatigued with trunk instability and need for min assist for balance; education/cues for proper use of RW and assist to turn RW; did well with "heel first" cuing for RLE (increased step length and foot clearance) Gait velocity interpretation: <1.8 ft/sec, indicate of  risk for recurrent falls    ADL: ADL Overall ADL's : Needs assistance/impaired Eating/Feeding: Supervision/ safety, Sitting Eating/Feeding Details (indicate cue type and reason): Pt able to set up lunch tray without physical assist Grooming: Oral care, Supervision/safety, Standing, Wash/dry hands Grooming Details (indicate cue type and reason): Pt able to complete  entire task of brushing teeth including opening plastic wrap to retrieve toothbrush and applying toothpaste to toothbrush. Upper Body Bathing: Min guard, Sitting Lower Body Bathing: Minimal assistance, Sitting/lateral leans Upper Body Dressing : Minimal assistance Upper Body Dressing Details (indicate cue type and reason): doff extra gown like robe Lower Body Dressing: Moderate assistance, Sitting/lateral leans Lower Body Dressing Details (indicate cue type and reason): for socks Toilet Transfer: Min guard, Ambulation, Regular Toilet Toilet Transfer Details (indicate cue type and reason): 1 person HHA Toileting- Clothing Manipulation and Hygiene: Modified independent, Sitting/lateral lean Functional mobility during ADLs: Minimal assistance (1 person HHA) General ADL Comments: R sided weakness, vision impacted  Cognition: Cognition Overall Cognitive Status: Within Functional Limits for tasks assessed Arousal/Alertness: Awake/alert Orientation Level: Oriented X4 Year: 2024 Month: February Day of Week: Correct Attention: Focused, Sustained Focused Attention: Appears intact Sustained Attention: Appears intact Memory: Impaired Memory Impairment: Decreased recall of new information, Decreased short term memory Awareness: Appears intact Executive Function: Organizing, Self Monitoring Organizing: Impaired Cognition Arousal/Alertness: Awake/alert Behavior During Therapy: WFL for tasks assessed/performed Overall Cognitive Status: Within Functional Limits for tasks assessed General Comments: deliberate speech, able to answer  all questions appropriately  Blood pressure 106/66, pulse 94, temperature 98 F (36.7 C), temperature source Oral, resp. rate 18, weight 81 kg, last menstrual period 09/22/2022, SpO2 100 %. Physical Exam Constitutional:      General: She is not in acute distress. HENT:     Head: Normocephalic and atraumatic.     Right Ear: External ear normal.     Left Ear: External ear normal.     Nose: Nose normal.  Eyes:     General: No scleral icterus.    Conjunctiva/sclera: Conjunctivae normal.  Cardiovascular:     Rate and Rhythm: Normal rate.  Pulmonary:     Effort: Pulmonary effort is normal.  Abdominal:     Palpations: Abdomen is soft.  Musculoskeletal:        General: Normal range of motion.     Cervical back: Normal range of motion.  Skin:    General: Skin is warm.  Neurological:     Mental Status: She is alert.     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  There appears to be weakness in the left 4th nerve as well.  Mild left facial weakness.  Speech is slightly slurred.  She is wearing tape over the right lens of her eyeglasses.  Right upper extremity strength is grossly 3 out of 5 proximal to distal.  Right lower extremity is 2 to 2+ out of 5 proximal distal.  Left motor function is 4+ to 5 out of 5 proximal to distal upper and lower extremity.  Sensation is decreased to light touch and pain at 1 out of 2 in the right arm and leg today.  Patient seems to demonstrate reasonable insight and awareness.  She is able to follow basic commands  Psychiatric:     Comments: Patient slightly flat but otherwise appropriate     Results for orders placed or performed during the hospital encounter of 10/03/22 (from the past 24 hour(s))  CBC     Status: Abnormal   Collection Time: 10/06/22  3:58 AM  Result Value Ref Range   WBC 3.6 (L) 4.0 - 10.5 K/uL   RBC 4.17 3.87 - 5.11 MIL/uL   Hemoglobin 11.0 (L) 12.0 - 15.0 g/dL   HCT 34.8 (L) 36.0 - 46.0 %   MCV 83.5 80.0 - 100.0 fL   MCH  26.4 26.0 - 34.0 pg   MCHC 31.6 30.0 - 36.0 g/dL   RDW 14.1 11.5 - 15.5 %   Platelets 160 150 - 400 K/uL   nRBC 0.0 0.0 - 0.2 %  Basic metabolic panel     Status: Abnormal   Collection Time: 10/06/22  3:58 AM  Result Value Ref Range   Sodium 137 135 - 145 mmol/L   Potassium 3.4 (L) 3.5 - 5.1 mmol/L   Chloride 107 98 - 111 mmol/L   CO2 23 22 - 32 mmol/L   Glucose, Bld 96 70 - 99 mg/dL   BUN 10 6 - 20 mg/dL   Creatinine, Ser 0.62 0.44 - 1.00 mg/dL   Calcium 8.9 8.9 - 10.3 mg/dL   GFR, Estimated >60 >60 mL/min   Anion gap 7 5 - 15   ECHOCARDIOGRAM COMPLETE  Result Date: 10/05/2022    ECHOCARDIOGRAM REPORT   Patient Name:   Donna Mccarthy Date of Exam: 10/04/2022 Medical Rec #:  [id number]          Height:       65.0 in Accession #:    [id number]         Weight:       178.6 lb Date of Birth:  September 20, 1975           BSA:          1.885 m Patient Age:    41 years           BP:           135/86 mmHg Patient Gender: F                  HR:           72 bpm. Exam Location:  [facility] Procedure: 2D Echo, Cardiac Doppler and Color Doppler Indications:    Stroke I63.9  History:        Patient has prior history of Echocardiogram examinations, most                 recent 11/09/2019. Stroke. GERD.  Sonographer:    Alvino Chapel RCS Referring Phys: [id number] [place]  1. Left ventricular ejection fraction, by estimation, is 60 to 65%. The left ventricle has normal function. The left ventricle has no regional wall motion abnormalities. Left ventricular diastolic parameters were normal.  2. Right ventricular systolic function is normal. The  right ventricular size is normal.  3. The mitral valve is normal in structure. Trivial mitral valve regurgitation. No evidence of mitral stenosis.  4. The aortic valve is tricuspid. Aortic valve regurgitation is not visualized. No aortic stenosis is present.  5. The inferior vena cava is dilated in size with >50% respiratory variability, suggesting right  atrial pressure of 8 mmHg. FINDINGS  Left Ventricle: Left ventricular ejection fraction, by estimation, is 60 to 65%. The left ventricle has normal function. The left ventricle has no regional wall motion abnormalities. The left ventricular internal cavity size was normal in size. There is  no left ventricular hypertrophy. Left ventricular diastolic parameters were normal. Right Ventricle: The right ventricular size is normal. Right ventricular systolic function is normal. Left Atrium: Left atrial size was normal in size. Right Atrium: Right atrial size was normal in size. Pericardium: There is no evidence of pericardial effusion. Mitral Valve: The mitral valve is normal in structure. Trivial mitral valve regurgitation. No evidence of mitral valve stenosis. MV peak gradient, 96.8 mmHg. The mean mitral valve gradient is 64.0 mmHg. Tricuspid Valve: The tricuspid valve is normal in structure. Tricuspid valve regurgitation is trivial. No evidence of tricuspid stenosis. Aortic Valve: The aortic valve is tricuspid. Aortic valve regurgitation is not visualized. No aortic stenosis is present. Pulmonic Valve: The pulmonic valve was normal in structure. Pulmonic valve regurgitation is not visualized. No evidence of pulmonic stenosis. Aorta: The aortic root is normal in size and structure. Venous: The inferior vena cava is dilated in size with greater than 50% respiratory variability, suggesting right atrial pressure of 8 mmHg. IAS/Shunts: No atrial level shunt detected by color flow Doppler.  LEFT VENTRICLE PLAX 2D LVIDd:         4.70 cm   Diastology LVIDs:         2.80 cm   LV e' medial:    12.20 cm/s LV PW:         0.80 cm   LV E/e' medial:  8.1 LV IVS:        0.90 cm   LV e' lateral:   17.10 cm/s LVOT diam:     2.00 cm   LV E/e' lateral: 5.8 LV SV:         80 LV SV Index:   43 LVOT Area:     3.14 cm  RIGHT VENTRICLE RV S prime:     18.20 cm/s TAPSE (M-mode): 2.5 cm LEFT ATRIUM             Index        RIGHT ATRIUM            Index LA diam:        3.60 cm 1.91 cm/m   RA Area:     13.00 cm LA Vol (A2C):   58.1 ml 30.82 ml/m  RA Volume:   32.90 ml  17.45 ml/m LA Vol (A4C):   45.6 ml 24.19 ml/m LA Biplane Vol: 53.8 ml 28.54 ml/m  AORTIC VALVE LVOT Vmax:   131.00 cm/s LVOT Vmean:  81.300 cm/s LVOT VTI:    0.256 m  AORTA Ao Root diam: 2.90 cm MITRAL VALVE MV Area (PHT): 2.45 cm    SHUNTS MV Area VTI:   0.53 cm    Systemic VTI:  0.26 m MV Peak grad:  96.8 mmHg   Systemic Diam: 2.00 cm MV Mean grad:  64.0 mmHg MV Vmax:       4.92 m/s MV Vmean:  381.0 cm/s MV Decel Time: 310 msec MV E velocity: 99.00 cm/s MV A velocity: 97.10 cm/s MV E/A ratio:  1.02 Kirk Ruths MD Electronically signed by Kirk Ruths MD Signature Date/Time: 10/05/2022/11:17:03 AM    Final    MR BRAIN WO CONTRAST  Result Date: 10/04/2022 CLINICAL DATA:  Stroke-like symptoms EXAM: MRI HEAD WITHOUT CONTRAST MRA HEAD WITHOUT CONTRAST TECHNIQUE: Multiplanar, multi-echo pulse sequences of the brain and surrounding structures were acquired without intravenous contrast. Angiographic images of the Circle of Willis were acquired using MRA technique without intravenous contrast. COMPARISON:  05/18/2007 MRI head, correlation is made with CTA head and neck 10/03/2022 FINDINGS: MRI HEAD FINDINGS Brain: No restricted diffusion to suggest acute or subacute infarct. No acute hemorrhage, mass, mass effect, or midline shift. No hydrocephalus or extra-axial collection. No hemosiderin deposition to suggest remote hemorrhage. Cerebral volume is normal for age. Vascular: Normal arterial flow voids. Please see MRA findings below. Skull and upper cervical spine: Normal marrow signal. Sinuses/Orbits: Clear paranasal sinuses. No acute finding in the orbits. Other: The mastoids are well aerated. MRA HEAD FINDINGS Anterior circulation: Both internal carotid arteries are patent to the termini, without significant stenosis. A1 segments patent. Normal anterior communicating artery.  Anterior cerebral arteries are patent to their distal aspects. No M1 stenosis or occlusion. Normal MCA bifurcations. Distal MCA branches perfused and symmetric. Posterior circulation: Vertebral arteries patent to the vertebrobasilar junction without stenosis. Posterior inferior cerebral arteries patent bilaterally. Basilar patent to its distal aspect. Superior cerebellar arteries patent bilaterally. Patent P1 segments. PCAs perfused to their distal aspects without stenosis. The bilateral posterior communicating arteries are patent. Anatomic variants: None significant IMPRESSION: 1. No acute intracranial process. No evidence of acute or subacute infarct. 2. No intracranial large vessel occlusion or significant stenosis. Previously seen narrowing of the MCAs and PCAs on the CTA is not apparent on this exam and may have been secondary to poor vascular opacification. Electronically Signed   By: Merilyn Baba M.D.   On: 10/04/2022 21:55   MR ANGIO HEAD WO CONTRAST  Result Date: 10/04/2022 CLINICAL DATA:  Stroke-like symptoms EXAM: MRI HEAD WITHOUT CONTRAST MRA HEAD WITHOUT CONTRAST TECHNIQUE: Multiplanar, multi-echo pulse sequences of the brain and surrounding structures were acquired without intravenous contrast. Angiographic images of the Circle of Willis were acquired using MRA technique without intravenous contrast. COMPARISON:  05/18/2007 MRI head, correlation is made with CTA head and neck 10/03/2022 FINDINGS: MRI HEAD FINDINGS Brain: No restricted diffusion to suggest acute or subacute infarct. No acute hemorrhage, mass, mass effect, or midline shift. No hydrocephalus or extra-axial collection. No hemosiderin deposition to suggest remote hemorrhage. Cerebral volume is normal for age. Vascular: Normal arterial flow voids. Please see MRA findings below. Skull and upper cervical spine: Normal marrow signal. Sinuses/Orbits: Clear paranasal sinuses. No acute finding in the orbits. Other: The mastoids are well  aerated. MRA HEAD FINDINGS Anterior circulation: Both internal carotid arteries are patent to the termini, without significant stenosis. A1 segments patent. Normal anterior communicating artery. Anterior cerebral arteries are patent to their distal aspects. No M1 stenosis or occlusion. Normal MCA bifurcations. Distal MCA branches perfused and symmetric. Posterior circulation: Vertebral arteries patent to the vertebrobasilar junction without stenosis. Posterior inferior cerebral arteries patent bilaterally. Basilar patent to its distal aspect. Superior cerebellar arteries patent bilaterally. Patent P1 segments. PCAs perfused to their distal aspects without stenosis. The bilateral posterior communicating arteries are patent. Anatomic variants: None significant IMPRESSION: 1. No acute intracranial process. No evidence of acute or subacute infarct.  2. No intracranial large vessel occlusion or significant stenosis. Previously seen narrowing of the MCAs and PCAs on the CTA is not apparent on this exam and may have been secondary to poor vascular opacification. Electronically Signed   By: Merilyn Baba M.D.   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 Does the need for close, 24 hr/day medical supervision in concert with the patient's rehab needs make it unreasonable for this patient to be served in a less intensive setting? Yes Co-Morbidities requiring supervision/potential complications:  -Post stroke sequela I -Hypertension management -Neurogenic headaches/ history of migraines -Recent COVID infection Due to bladder management, bowel management, safety, skin/wound care, disease management, medication administration, pain management, and patient education, does the patient require 24 hr/day rehab nursing? Yes Does the patient require coordinated care of a physician,  rehab nurse, therapy disciplines of PT, OT, SLP to address physical and functional deficits in the context of the above medical diagnosis(es)? Yes Addressing deficits in the following areas: balance, endurance, locomotion, strength, transferring, bowel/bladder control, bathing, dressing, feeding, grooming, toileting, speech, and psychosocial support Can the patient actively participate in an intensive therapy program of at least 3 hrs of therapy per day at least 5 days per week? Yes The potential for patient to make measurable gains while on inpatient rehab is excellent Anticipated functional outcomes upon discharge from inpatient rehab are modified independent and supervision  with PT, modified independent and supervision with OT, modified independent with SLP. Estimated rehab length of stay to reach the above functional goals is: 11-15 days Anticipated discharge destination: Home Overall Rehab/Functional Prognosis: excellent  POST ACUTE RECOMMENDATIONS: This patient's condition is appropriate for continued rehabilitative care in the following setting: CIR Patient has agreed to participate in recommended program. Yes Note that insurance prior authorization may be required for reimbursement for recommended care.  Comment: Pt is motivated to work on rehab and return to her prior level of function. Rehab Admissions Coordinator to follow up    I have personally performed a face to face diagnostic evaluation of this patient. Additionally, I have examined the patient's medical record including any pertinent labs and radiographic images. If the physician assistant has documented in this note, I have reviewed and edited or otherwise concur with the physician assistant's documentation.  Thanks,  Meredith Staggers, MD 10/06/2022
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
## 2022-10-07 ENCOUNTER — Encounter (HOSPITAL_COMMUNITY): Payer: Self-pay | Admitting: Neurology

## 2022-10-07 ENCOUNTER — Inpatient Hospital Stay (HOSPITAL_COMMUNITY): Payer: 59

## 2022-10-07 ENCOUNTER — Inpatient Hospital Stay (HOSPITAL_COMMUNITY): Payer: 59 | Admitting: Anesthesiology

## 2022-10-07 ENCOUNTER — Encounter (HOSPITAL_COMMUNITY)
Admission: EM | Disposition: A | Discharge status: Rehabilitation Facility | Payer: Self-pay | Source: Home / Self Care | Attending: Neurology

## 2022-10-07 DIAGNOSIS — F419 Anxiety disorder, unspecified: Secondary | ICD-10-CM

## 2022-10-07 DIAGNOSIS — I639 Cerebral infarction, unspecified: Secondary | ICD-10-CM

## 2022-10-07 DIAGNOSIS — E039 Hypothyroidism, unspecified: Secondary | ICD-10-CM | POA: Diagnosis not present

## 2022-10-07 HISTORY — PX: TEE WITHOUT CARDIOVERSION: SHX5443

## 2022-10-07 LAB — CBC
HCT: 35.3 % — ABNORMAL LOW (ref 36.0–46.0)
Hemoglobin: 11.1 g/dL — ABNORMAL LOW (ref 12.0–15.0)
MCH: 26.2 pg (ref 26.0–34.0)
MCHC: 31.4 g/dL (ref 30.0–36.0)
MCV: 83.3 fL (ref 80.0–100.0)
Platelets: 166 10*3/uL (ref 150–400)
RBC: 4.24 MIL/uL (ref 3.87–5.11)
RDW: 13.9 % (ref 11.5–15.5)
WBC: 3.7 10*3/uL — ABNORMAL LOW (ref 4.0–10.5)
nRBC: 0 % (ref 0.0–0.2)

## 2022-10-07 LAB — BASIC METABOLIC PANEL
Anion gap: 9 (ref 5–15)
BUN: 7 mg/dL (ref 6–20)
CO2: 21 mmol/L — ABNORMAL LOW (ref 22–32)
Calcium: 9 mg/dL (ref 8.9–10.3)
Chloride: 109 mmol/L (ref 98–111)
Creatinine, Ser: 0.53 mg/dL (ref 0.44–1.00)
GFR, Estimated: >60 mL/min (ref >60)
Glucose, Bld: 95 mg/dL (ref 70–99)
Potassium: 3.7 mmol/L (ref 3.5–5.1)
Sodium: 139 mmol/L (ref 135–145)

## 2022-10-07 LAB — BETA-2-GLYCOPROTEIN I ABS, IGG/M/A
Beta-2 Glyco I IgG: <9 GPI IgG units (ref 0–20)
Beta-2-Glycoprotein I IgA: <9 GPI IgA units (ref 0–25)
Beta-2-Glycoprotein I IgM: <9 GPI IgM units (ref 0–32)

## 2022-10-07 LAB — CARDIOLIPIN ANTIBODIES, IGG, IGM, IGA
Anticardiolipin IgA: <9 APL U/mL (ref 0–11)
Anticardiolipin IgG: <9 GPL U/mL (ref 0–14)
Anticardiolipin IgM: 11 MPL U/mL (ref 0–12)

## 2022-10-07 LAB — HOMOCYSTEINE: Homocysteine: 12.6 umol/L (ref 0.0–14.5)

## 2022-10-07 LAB — ECHO TEE

## 2022-10-07 SURGERY — ECHOCARDIOGRAM, TRANSESOPHAGEAL
Anesthesia: Monitor Anesthesia Care

## 2022-10-07 MED ORDER — PROPOFOL 500 MG/50ML IV EMUL
INTRAVENOUS | Status: DC | PRN
  Administered 2022-10-07: 150 ug/kg/min via INTRAVENOUS

## 2022-10-07 MED ORDER — SODIUM CHLORIDE 0.9 % IV SOLN: INTRAVENOUS | Status: DC

## 2022-10-07 MED ORDER — PHENYLEPHRINE 80 MCG/ML (10ML) SYRINGE FOR IV PUSH (FOR BLOOD PRESSURE SUPPORT)
PREFILLED_SYRINGE | INTRAVENOUS | Status: DC | PRN
  Administered 2022-10-07: 80 ug via INTRAVENOUS

## 2022-10-07 MED ORDER — PROPOFOL 10 MG/ML IV BOLUS
INTRAVENOUS | Status: DC | PRN
  Administered 2022-10-07 (×2): 30 mg via INTRAVENOUS

## 2022-10-07 NOTE — Interval H&P Note (Signed)
History and Physical Interval Note:  10/07/2022 2:05 PM  Donna Mccarthy  has presented today for surgery, with the diagnosis of STROKE.  The various methods of treatment have been discussed with the patient and family. After consideration of risks, benefits and other options for treatment, the patient has consented to  Procedure(s): TRANSESOPHAGEAL ECHOCARDIOGRAM (TEE) (N/A) as a surgical intervention.  The patient's history has been reviewed, patient examined, no change in status, stable for surgery.  I have reviewed the patient's chart and labs.  Questions were answered to the patient's satisfaction.     Freada Bergeron

## 2022-10-07 NOTE — Anesthesia Procedure Notes (Signed)
Procedure Name: MAC Date/Time: 10/07/2022 3:01 PM  Performed by: Heide Scales, CRNAPre-anesthesia Checklist: Patient identified, Emergency Drugs available, Suction available and Patient being monitored Patient Re-evaluated:Patient Re-evaluated prior to induction Oxygen Delivery Method: Nasal cannula Preoxygenation: Pre-oxygenation with 100% oxygen Induction Type: IV induction Dental Injury: Teeth and Oropharynx as per pre-operative assessment

## 2022-10-07 NOTE — Anesthesia Preprocedure Evaluation (Signed)
Anesthesia Evaluation  Patient identified by MRN, date of birth, ID band Patient awake    Reviewed: Allergy & Precautions, H&P , NPO status , Patient's Chart, lab work & pertinent test results  Airway Mallampati: II  TM Distance: >3 FB Neck ROM: Full    Dental no notable dental hx.    Pulmonary neg pulmonary ROS   Pulmonary exam normal breath sounds clear to auscultation       Cardiovascular negative cardio ROS Normal cardiovascular exam Rhythm:Regular Rate:Normal     Neuro/Psych   Anxiety     CVA  negative psych ROS   GI/Hepatic Neg liver ROS,GERD  ,,  Endo/Other  Hypothyroidism    Renal/GU negative Renal ROS  negative genitourinary   Musculoskeletal negative musculoskeletal ROS (+)    Abdominal   Peds negative pediatric ROS (+)  Hematology negative hematology ROS (+)   Anesthesia Other Findings   Reproductive/Obstetrics negative OB ROS                             Anesthesia Physical Anesthesia Plan  ASA: 3  Anesthesia Plan: MAC   Post-op Pain Management: Minimal or no pain anticipated   Induction: Intravenous  PONV Risk Score and Plan: 2 and Propofol infusion and Treatment may vary due to age or medical condition  Airway Management Planned: Simple Face Mask  Additional Equipment:   Intra-op Plan:   Post-operative Plan:   Informed Consent: I have reviewed the patients History and Physical, chart, labs and discussed the procedure including the risks, benefits and alternatives for the proposed anesthesia with the patient or authorized representative who has indicated his/her understanding and acceptance.     Dental advisory given  Plan Discussed with: CRNA and Surgeon  Anesthesia Plan Comments:        Anesthesia Quick Evaluation

## 2022-10-07 NOTE — Progress Notes (Signed)
  Spoke with Dr. Erlinda Hong about difference in NIHSS (previous per night RN 0; I scored a 4). Dr. Erlinda Hong stated no change since yesterday, but does have "functional components on exam" therefore, this is not a change since admission.  Initial NIHSS was 6.

## 2022-10-07 NOTE — H&P (View-Only) (Signed)
STROKE TEAM PROGRESS NOTE   SUBJECTIVE (INTERVAL HISTORY) Patient is sitting up at the bedside with family present in the room that is leaving upon NP arrival. RN is at the bedside.  Neurological exam with reported improvement of the right arm and hand weakness overnight and improvement of diplopia. Ongoing right lower extremity weakness, stable without improvement per patient.   VSS. K is 3.7 following replacement.  Hypercoag panel pending TEE pending today.  Insurance authorization pending for CIR placement.   OBJECTIVE Temp:  [97.6 F (36.4 C)-98.3 F (36.8 C)] 98 F (36.7 C) (02/20 1100) Pulse Rate:  [71-94] 91 (02/20 1100) Cardiac Rhythm: Normal sinus rhythm (02/20 0701) Resp:  [16-18] 18 (02/20 1100) BP: (102-134)/(58-83) 112/77 (02/20 1100) SpO2:  [98 %-100 %] 99 % (02/20 1100) Weight:  [82.3 kg] 82.3 kg (02/20 0750)  Recent Labs  Lab 10/03/22 1849  GLUCAP 87    Recent Labs  Lab 10/03/22 1850 10/05/22 0542 10/06/22 0358 10/07/22 0336  NA 137 139 137 139  K 3.8 3.7 3.4* 3.7  CL 109 112* 107 109  CO2 22 20* 23 21*  GLUCOSE 94 95 96 95  BUN 12 <5* 10 7  CREATININE 0.66 0.54 0.62 0.53  CALCIUM 8.9 8.9 8.9 9.0    Recent Labs  Lab 10/03/22 1850  AST 23  ALT 13  ALKPHOS 69  BILITOT 0.6  PROT 8.0  ALBUMIN 4.2    Recent Labs  Lab 10/03/22 1850 10/05/22 0542 10/06/22 0358 10/07/22 0336  WBC 5.6 4.1 3.6* 3.7*  NEUTROABS 1.9  --   --   --   HGB 11.5* 11.1* 11.0* 11.1*  HCT 36.3 35.6* 34.8* 35.3*  MCV 83.3 83.6 83.5 83.3  PLT 241 171 160 166    No results for input(s): "CKTOTAL", "CKMB", "CKMBINDEX", "TROPONINI" in the last 168 hours. No results for input(s): "LABPROT", "INR" in the last 72 hours.  No results for input(s): "COLORURINE", "LABSPEC", "PHURINE", "GLUCOSEU", "HGBUR", "BILIRUBINUR", "KETONESUR", "PROTEINUR", "UROBILINOGEN", "NITRITE", "LEUKOCYTESUR" in the last 72 hours.  Invalid input(s): "APPERANCEUR"     Component Value Date/Time    CHOL 129 10/04/2022 0244   CHOL 160 05/15/2022 0845   TRIG 28 10/04/2022 0244   HDL 40 (L) 10/04/2022 0244   HDL 66 05/15/2022 0845   CHOLHDL 3.2 10/04/2022 0244   VLDL 6 10/04/2022 0244   LDLCALC 83 10/04/2022 0244   LDLCALC 84 05/15/2022 0845   Lab Results  Component Value Date   HGBA1C 5.0 10/04/2022      Component Value Date/Time   LABOPIA POSITIVE (A) 10/04/2022 1019   COCAINSCRNUR NONE DETECTED 10/04/2022 1019   LABBENZ NONE DETECTED 10/04/2022 1019   AMPHETMU NONE DETECTED 10/04/2022 1019   THCU NONE DETECTED 10/04/2022 1019   LABBARB NONE DETECTED 10/04/2022 1019    Recent Labs  Lab 10/03/22 1850  ETH <10     I have personally reviewed the radiological images below and agree with the radiology interpretations.  ECHOCARDIOGRAM COMPLETE  Result Date: 10/05/2022    ECHOCARDIOGRAM REPORT   Patient Name:   KASEY JANSSON Date of Exam: 10/04/2022 Medical Rec #:  [id number]          Height:       65.0 in Accession #:    [id number]         Weight:       178.6 lb Date of Birth:  Jun 30, 1976           BSA:  1.885 m Patient Age:    62 years           BP:           135/86 mmHg Patient Gender: F                  HR:           72 bpm. Exam Location:  [facility] Procedure: 2D Echo, Cardiac Doppler and Color Doppler Indications:    Stroke I63.9  History:        Patient has prior history of Echocardiogram examinations, most                 recent 11/09/2019. Stroke. GERD.  Sonographer:    Alvino Chapel RCS Referring Phys: [id number] [place]  1. Left ventricular ejection fraction, by estimation, is 60 to 65%. The left ventricle has normal function. The left ventricle has no regional wall motion abnormalities. Left ventricular diastolic parameters were normal.  2. Right ventricular systolic function is normal. The right ventricular size is normal.  3. The mitral valve is normal in structure. Trivial mitral valve regurgitation. No evidence of mitral stenosis.  4. The  aortic valve is tricuspid. Aortic valve regurgitation is not visualized. No aortic stenosis is present.  5. The inferior vena cava is dilated in size with >50% respiratory variability, suggesting right atrial pressure of 8 mmHg. FINDINGS  Left Ventricle: Left ventricular ejection fraction, by estimation, is 60 to 65%. The left ventricle has normal function. The left ventricle has no regional wall motion abnormalities. The left ventricular internal cavity size was normal in size. There is  no left ventricular hypertrophy. Left ventricular diastolic parameters were normal. Right Ventricle: The right ventricular size is normal. Right ventricular systolic function is normal. Left Atrium: Left atrial size was normal in size. Right Atrium: Right atrial size was normal in size. Pericardium: There is no evidence of pericardial effusion. Mitral Valve: The mitral valve is normal in structure. Trivial mitral valve regurgitation. No evidence of mitral valve stenosis. MV peak gradient, 96.8 mmHg. The mean mitral valve gradient is 64.0 mmHg. Tricuspid Valve: The tricuspid valve is normal in structure. Tricuspid valve regurgitation is trivial. No evidence of tricuspid stenosis. Aortic Valve: The aortic valve is tricuspid. Aortic valve regurgitation is not visualized. No aortic stenosis is present. Pulmonic Valve: The pulmonic valve was normal in structure. Pulmonic valve regurgitation is not visualized. No evidence of pulmonic stenosis. Aorta: The aortic root is normal in size and structure. Venous: The inferior vena cava is dilated in size with greater than 50% respiratory variability, suggesting right atrial pressure of 8 mmHg. IAS/Shunts: No atrial level shunt detected by color flow Doppler.  LEFT VENTRICLE PLAX 2D LVIDd:         4.70 cm   Diastology LVIDs:         2.80 cm   LV e' medial:    12.20 cm/s LV PW:         0.80 cm   LV E/e' medial:  8.1 LV IVS:        0.90 cm   LV e' lateral:   17.10 cm/s LVOT diam:     2.00 cm   LV  E/e' lateral: 5.8 LV SV:         80 LV SV Index:   43 LVOT Area:     3.14 cm  RIGHT VENTRICLE RV S prime:     18.20 cm/s TAPSE (M-mode): 2.5 cm LEFT ATRIUM  Index        RIGHT ATRIUM           Index LA diam:        3.60 cm 1.91 cm/m   RA Area:     13.00 cm LA Vol (A2C):   58.1 ml 30.82 ml/m  RA Volume:   32.90 ml  17.45 ml/m LA Vol (A4C):   45.6 ml 24.19 ml/m LA Biplane Vol: 53.8 ml 28.54 ml/m  AORTIC VALVE LVOT Vmax:   131.00 cm/s LVOT Vmean:  81.300 cm/s LVOT VTI:    0.256 m  AORTA Ao Root diam: 2.90 cm MITRAL VALVE MV Area (PHT): 2.45 cm    SHUNTS MV Area VTI:   0.53 cm    Systemic VTI:  0.26 m MV Peak grad:  96.8 mmHg   Systemic Diam: 2.00 cm MV Mean grad:  64.0 mmHg MV Vmax:       4.92 m/s MV Vmean:      381.0 cm/s MV Decel Time: 310 msec MV E velocity: 99.00 cm/s MV A velocity: 97.10 cm/s MV E/A ratio:  1.02 Kirk Ruths MD Electronically signed by Kirk Ruths MD Signature Date/Time: 10/05/2022/11:17:03 AM    Final    MR BRAIN WO CONTRAST  Result Date: 10/04/2022 CLINICAL DATA:  Stroke-like symptoms EXAM: MRI HEAD WITHOUT CONTRAST MRA HEAD WITHOUT CONTRAST TECHNIQUE: Multiplanar, multi-echo pulse sequences of the brain and surrounding structures were acquired without intravenous contrast. Angiographic images of the Circle of Willis were acquired using MRA technique without intravenous contrast. COMPARISON:  05/18/2007 MRI head, correlation is made with CTA head and neck 10/03/2022 FINDINGS: MRI HEAD FINDINGS Brain: No restricted diffusion to suggest acute or subacute infarct. No acute hemorrhage, mass, mass effect, or midline shift. No hydrocephalus or extra-axial collection. No hemosiderin deposition to suggest remote hemorrhage. Cerebral volume is normal for age. Vascular: Normal arterial flow voids. Please see MRA findings below. Skull and upper cervical spine: Normal marrow signal. Sinuses/Orbits: Clear paranasal sinuses. No acute finding in the orbits. Other: The mastoids are  well aerated. MRA HEAD FINDINGS Anterior circulation: Both internal carotid arteries are patent to the termini, without significant stenosis. A1 segments patent. Normal anterior communicating artery. Anterior cerebral arteries are patent to their distal aspects. No M1 stenosis or occlusion. Normal MCA bifurcations. Distal MCA branches perfused and symmetric. Posterior circulation: Vertebral arteries patent to the vertebrobasilar junction without stenosis. Posterior inferior cerebral arteries patent bilaterally. Basilar patent to its distal aspect. Superior cerebellar arteries patent bilaterally. Patent P1 segments. PCAs perfused to their distal aspects without stenosis. The bilateral posterior communicating arteries are patent. Anatomic variants: None significant IMPRESSION: 1. No acute intracranial process. No evidence of acute or subacute infarct. 2. No intracranial large vessel occlusion or significant stenosis. Previously seen narrowing of the MCAs and PCAs on the CTA is not apparent on this exam and may have been secondary to poor vascular opacification. Electronically Signed   By: Merilyn Baba M.D.   On: 10/04/2022 21:55   MR ANGIO HEAD WO CONTRAST  Result Date: 10/04/2022 CLINICAL DATA:  Stroke-like symptoms EXAM: MRI HEAD WITHOUT CONTRAST MRA HEAD WITHOUT CONTRAST TECHNIQUE: Multiplanar, multi-echo pulse sequences of the brain and surrounding structures were acquired without intravenous contrast. Angiographic images of the Circle of Willis were acquired using MRA technique without intravenous contrast. COMPARISON:  05/18/2007 MRI head, correlation is made with CTA head and neck 10/03/2022 FINDINGS: MRI HEAD FINDINGS Brain: No restricted diffusion to suggest acute or subacute infarct. No acute hemorrhage, mass,  mass effect, or midline shift. No hydrocephalus or extra-axial collection. No hemosiderin deposition to suggest remote hemorrhage. Cerebral volume is normal for age. Vascular: Normal arterial flow  voids. Please see MRA findings below. Skull and upper cervical spine: Normal marrow signal. Sinuses/Orbits: Clear paranasal sinuses. No acute finding in the orbits. Other: The mastoids are well aerated. MRA HEAD FINDINGS Anterior circulation: Both internal carotid arteries are patent to the termini, without significant stenosis. A1 segments patent. Normal anterior communicating artery. Anterior cerebral arteries are patent to their distal aspects. No M1 stenosis or occlusion. Normal MCA bifurcations. Distal MCA branches perfused and symmetric. Posterior circulation: Vertebral arteries patent to the vertebrobasilar junction without stenosis. Posterior inferior cerebral arteries patent bilaterally. Basilar patent to its distal aspect. Superior cerebellar arteries patent bilaterally. Patent P1 segments. PCAs perfused to their distal aspects without stenosis. The bilateral posterior communicating arteries are patent. Anatomic variants: None significant IMPRESSION: 1. No acute intracranial process. No evidence of acute or subacute infarct. 2. No intracranial large vessel occlusion or significant stenosis. Previously seen narrowing of the MCAs and PCAs on the CTA is not apparent on this exam and may have been secondary to poor vascular opacification. Electronically Signed   By: Merilyn Baba M.D.   On: 10/04/2022 21:55   CT ANGIO HEAD NECK W WO CM (CODE STROKE)  Result Date: 10/03/2022 CLINICAL DATA:  Stroke-like symptoms EXAM: CT ANGIOGRAPHY HEAD AND NECK TECHNIQUE: Multidetector CT imaging of the head and neck was performed using the standard protocol during bolus administration of intravenous contrast. Multiplanar CT image reconstructions and MIPs were obtained to evaluate the vascular anatomy. Carotid stenosis measurements (when applicable) are obtained utilizing NASCET criteria, using the distal internal carotid diameter as the denominator. RADIATION DOSE REDUCTION: This exam was performed according to the  departmental dose-optimization program which includes automated exposure control, adjustment of the mA and/or kV according to patient size and/or use of iterative reconstruction technique. CONTRAST:  38m OMNIPAQUE IOHEXOL 350 MG/ML SOLN COMPARISON:  No prior CTA available, correlation is made with CT head 10/03/2022 FINDINGS: CT HEAD FINDINGS For noncontrast findings, please see same day CT head. CTA NECK FINDINGS Aortic arch: Standard branching. Imaged portion shows no evidence of aneurysm or dissection. No significant stenosis of the major arch vessel origins. Right carotid system: No evidence of stenosis, dissection, or occlusion. Left carotid system: No evidence of stenosis, dissection, or occlusion. Vertebral arteries: No evidence of stenosis, dissection, or occlusion. Skeleton: No acute osseous abnormality. Other neck: No acute finding. Upper chest: No focal pulmonary opacity or pleural effusion. Review of the MIP images confirms the above findings CTA HEAD FINDINGS Evaluation is somewhat limited by bolus timing. Anterior circulation: Both internal carotid arteries are patent to the termini, without significant stenosis. A1 segments patent. Normal anterior communicating artery. Anterior cerebral arteries are patent to their distal aspects. The bilateral MCAs appear somewhat narrow, without evidence of downstream dilatation. No focal M1 stenosis or occlusion. MCA branches perfused and symmetric. Posterior circulation: Vertebral arteries patent to the vertebrobasilar junction without stenosis. Posterior inferior cerebellar arteries not well seen. Basilar patent to its distal aspect. Superior cerebellar arteries patent proximally. Patent P1 segments. The bilateral PCAs appear somewhat narrow, without evidence of downstream dilatation, but appear to be perfused proximally. Evaluation of the distal PCAs is limited by venous contamination. Venous sinuses: Patent. Anatomic variants: None significant. Review of the  MIP images confirms the above findings IMPRESSION: 1. No large vessel occlusion or significant stenosis in the neck. 2. The bilateral  MCAs and PCAs appear somewhat narrow, without evidence of downstream dilatation, but appear to be perfused proximally. This could be secondary to bolus timing, but a vasculitis or reversible cerebral vasoconstriction syndrome (RCVS) is not excluded. Electronically Signed   By: Merilyn Baba M.D.   On: 10/03/2022 20:05   CT HEAD CODE STROKE WO CONTRAST  Result Date: 10/03/2022 CLINICAL DATA:  Code stroke. Neuro deficit, acute, stroke suspected. Left-sided facial droop. Right-sided weakness. EXAM: CT HEAD WITHOUT CONTRAST TECHNIQUE: Contiguous axial images were obtained from the base of the skull through the vertex without intravenous contrast. RADIATION DOSE REDUCTION: This exam was performed according to the departmental dose-optimization program which includes automated exposure control, adjustment of the mA and/or kV according to patient size and/or use of iterative reconstruction technique. COMPARISON:  Brain MRI 05/18/2007.  Head CT 05/18/2007. FINDINGS: Brain: Cerebral volume is normal. There is no acute intracranial hemorrhage. No demarcated cortical infarct. No extra-axial fluid collection. No evidence of an intracranial mass. No midline shift. Vascular: No hyperdense vessel. Skull: No fracture or aggressive osseous lesion. Sinuses/Orbits: No mass or acute finding within the imaged orbits. No significant paranasal sinus disease at the imaged levels. ASPECTS [facility] Stroke Program Early CT Score) - Ganglionic level infarction (caudate, lentiform nuclei, internal capsule, insula, M1-M3 cortex): 7 - Supraganglionic infarction (M4-M6 cortex): 3 Total score (0-10 with 10 being normal): 10 No evidence of an acute intracranial abnormality. These results were called by telephone at the time of interpretation on 10/03/2022 at 7:03 pm to provider ADAM CURATOLO , who verbally  acknowledged these results. IMPRESSION: No evidence of an acute intracranial abnormality. ASPECTS is 10. Electronically Signed   By: Kellie Simmering D.O.   On: 10/03/2022 19:04     PHYSICAL EXAM  Temp:  [97.6 F (36.4 C)-98.3 F (36.8 C)] 98 F (36.7 C) (02/20 1100) Pulse Rate:  [71-94] 91 (02/20 1100) Resp:  [16-18] 18 (02/20 1100) BP: (102-134)/(58-83) 112/77 (02/20 1100) SpO2:  [98 %-100 %] 99 % (02/20 1100) Weight:  [82.3 kg] 82.3 kg (02/20 0750)  General - Well nourished, well developed, in no acute distress  Cardiovascular - Regular rhythm and rate.  Neuro - awake, alert, eyes open, orientated to age, place, time and people. There is no hesitant speech present on exam today. No aphasia, no dysarthria today, following all simple commands. B/l eyes lateral gaze incomplete with diplopia, significantly improved per patient with right lense taping, tracking bilaterally but cranial nerve VI palsy observed bilaterally, left homonymous hemianopia, PERRL. Left subtle facial droop, but symmetrical closing eyes.  Tongue midline BUEs against gravity with RUE drift but no pronation. BLEs against gravity with some weakness on the right side. Sensation decreased light touch on the right subjectively (improved on the right face and arm with minimal decreased sensation to light touch but consistent decreased sensation to light touch on the right lower extremity today), b/l FTN intact grossly no ataxia but slow on action, gait not tested.   ASSESSMENT/PLAN Ms. CESILY COSTENBADER is a 47 y.o. female with history of recent COVID 2 weeks ago, migraine admitted for slurred speech, blurry vision, double vision, left facial droop during driving.  TNK given at med center [facility].  MRI was negative, suspect small brainstem stroke unable to be seen on MRI.  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 To be complete today.  LDL 83 HgbA1c 5.0 Hypercoag lab pending UDS positive for opiates SCD's for VTE prophylaxis No antithrombotic prior to admission, now on aspirin 81 mg daily and Plavix 75 mg daily DAPT for 3 weeks and then ASA alone Ongoing aggressive stroke risk factor management Therapy recommendations: CIR  Disposition: Pending insurance authorization for placement   Hypertension Stable Long term BP goal normotensive  Hyperlipidemia Home meds: None LDL 83, goal < 70 Now on Lipitor 20, no high intensity statin given LDL near goal Continue statin at discharge  Other Active Problems Migraine on Topamax Recent COVID infection Hypokalemia K 3.4 replaced will recheck in am   Hospital day # 4  Patient seen by NP and then by MD, MD to edit note is needed  Anibal Henderson, AGACNP-BC [facility] Pager: [phone]   To contact Stroke Continuity provider, please refer to [email or web address] After hours, contact General Neurology

## 2022-10-07 NOTE — Progress Notes (Signed)
Inpatient Rehab Admissions Coordinator:   Insurance auth pending.  Will follow.   Shann Medal, PT, DPT Admissions Coordinator [phone] 10/07/22  11:49 AM

## 2022-10-07 NOTE — Transfer of Care (Signed)
Immediate Anesthesia Transfer of Care Note  Patient: Donna Mccarthy  Procedure(s) Performed: TRANSESOPHAGEAL ECHOCARDIOGRAM (TEE)  Patient Location: Endoscopy Unit  Anesthesia Type:MAC  Level of Consciousness: drowsy  Airway & Oxygen Therapy: Patient Spontanous Breathing and Patient connected to nasal cannula oxygen  Post-op Assessment: Report given to RN and Post -op Vital signs reviewed and stable  Post vital signs: Reviewed and stable  Last Vitals:  Vitals Value Taken Time  BP 89/57   Temp    Pulse 77   Resp 16   SpO2 94     Last Pain:  Vitals:   10/07/22 1312  TempSrc: Temporal  PainSc: 0-No pain         Complications: No notable events documented.

## 2022-10-07 NOTE — Progress Notes (Addendum)
STROKE TEAM PROGRESS NOTE   SUBJECTIVE (INTERVAL HISTORY) Patient is sitting up at the bedside with family present in the room that is leaving upon NP arrival. RN is at the bedside.  Neurological exam with reported improvement of the right arm and hand weakness overnight and improvement of diplopia. Ongoing right lower extremity weakness, stable without improvement per patient.   VSS. K is 3.7 following replacement.  Hypercoag panel pending TEE pending today.  Insurance authorization pending for CIR placement.   OBJECTIVE Temp:  [97.6 F (36.4 C)-98.3 F (36.8 C)] 98 F (36.7 C) (02/20 1100) Pulse Rate:  [71-94] 91 (02/20 1100) Cardiac Rhythm: Normal sinus rhythm (02/20 0701) Resp:  [16-18] 18 (02/20 1100) BP: (102-134)/(58-83) 112/77 (02/20 1100) SpO2:  [98 %-100 %] 99 % (02/20 1100) Weight:  [82.3 kg] 82.3 kg (02/20 0750)  Recent Labs  Lab 10/03/22 1849  GLUCAP 87    Recent Labs  Lab 10/03/22 1850 10/05/22 0542 10/06/22 0358 10/07/22 0336  NA 137 139 137 139  K 3.8 3.7 3.4* 3.7  CL 109 112* 107 109  CO2 22 20* 23 21*  GLUCOSE 94 95 96 95  BUN 12 <5* 10 7  CREATININE 0.66 0.54 0.62 0.53  CALCIUM 8.9 8.9 8.9 9.0    Recent Labs  Lab 10/03/22 1850  AST 23  ALT 13  ALKPHOS 69  BILITOT 0.6  PROT 8.0  ALBUMIN 4.2    Recent Labs  Lab 10/03/22 1850 10/05/22 0542 10/06/22 0358 10/07/22 0336  WBC 5.6 4.1 3.6* 3.7*  NEUTROABS 1.9  --   --   --   HGB 11.5* 11.1* 11.0* 11.1*  HCT 36.3 35.6* 34.8* 35.3*  MCV 83.3 83.6 83.5 83.3  PLT 241 171 160 166    No results for input(s): "CKTOTAL", "CKMB", "CKMBINDEX", "TROPONINI" in the last 168 hours. No results for input(s): "LABPROT", "INR" in the last 72 hours.  No results for input(s): "COLORURINE", "LABSPEC", "PHURINE", "GLUCOSEU", "HGBUR", "BILIRUBINUR", "KETONESUR", "PROTEINUR", "UROBILINOGEN", "NITRITE", "LEUKOCYTESUR" in the last 72 hours.  Invalid input(s): "APPERANCEUR"     Component Value Date/Time    CHOL 129 10/04/2022 0244   CHOL 160 05/15/2022 0845   TRIG 28 10/04/2022 0244   HDL 40 (L) 10/04/2022 0244   HDL 66 05/15/2022 0845   CHOLHDL 3.2 10/04/2022 0244   VLDL 6 10/04/2022 0244   LDLCALC 83 10/04/2022 0244   LDLCALC 84 05/15/2022 0845   Lab Results  Component Value Date   HGBA1C 5.0 10/04/2022      Component Value Date/Time   LABOPIA POSITIVE (A) 10/04/2022 1019   COCAINSCRNUR NONE DETECTED 10/04/2022 1019   LABBENZ NONE DETECTED 10/04/2022 1019   AMPHETMU NONE DETECTED 10/04/2022 1019   THCU NONE DETECTED 10/04/2022 1019   LABBARB NONE DETECTED 10/04/2022 1019    Recent Labs  Lab 10/03/22 1850  ETH <10     I have personally reviewed the radiological images below and agree with the radiology interpretations.  ECHOCARDIOGRAM COMPLETE  Result Date: 10/05/2022    ECHOCARDIOGRAM REPORT   Patient Name:   Donna Mccarthy Date of Exam: 10/04/2022 Medical Rec #:  [id number]          Height:       65.0 in Accession #:    [id number]         Weight:       178.6 lb Date of Birth:  12-16-1975           BSA:  1.885 m Patient Age:    47 years           BP:           135/86 mmHg Patient Gender: F                  HR:           72 bpm. Exam Location:  [facility] Procedure: 2D Echo, Cardiac Doppler and Color Doppler Indications:    Stroke I63.9  History:        Patient has prior history of Echocardiogram examinations, most                 recent 11/09/2019. Stroke. GERD.  Sonographer:    Alvino Chapel RCS Referring Phys: [id number] [place]  1. Left ventricular ejection fraction, by estimation, is 60 to 65%. The left ventricle has normal function. The left ventricle has no regional wall motion abnormalities. Left ventricular diastolic parameters were normal.  2. Right ventricular systolic function is normal. The right ventricular size is normal.  3. The mitral valve is normal in structure. Trivial mitral valve regurgitation. No evidence of mitral stenosis.  4. The  aortic valve is tricuspid. Aortic valve regurgitation is not visualized. No aortic stenosis is present.  5. The inferior vena cava is dilated in size with >50% respiratory variability, suggesting right atrial pressure of 8 mmHg. FINDINGS  Left Ventricle: Left ventricular ejection fraction, by estimation, is 60 to 65%. The left ventricle has normal function. The left ventricle has no regional wall motion abnormalities. The left ventricular internal cavity size was normal in size. There is  no left ventricular hypertrophy. Left ventricular diastolic parameters were normal. Right Ventricle: The right ventricular size is normal. Right ventricular systolic function is normal. Left Atrium: Left atrial size was normal in size. Right Atrium: Right atrial size was normal in size. Pericardium: There is no evidence of pericardial effusion. Mitral Valve: The mitral valve is normal in structure. Trivial mitral valve regurgitation. No evidence of mitral valve stenosis. MV peak gradient, 96.8 mmHg. The mean mitral valve gradient is 64.0 mmHg. Tricuspid Valve: The tricuspid valve is normal in structure. Tricuspid valve regurgitation is trivial. No evidence of tricuspid stenosis. Aortic Valve: The aortic valve is tricuspid. Aortic valve regurgitation is not visualized. No aortic stenosis is present. Pulmonic Valve: The pulmonic valve was normal in structure. Pulmonic valve regurgitation is not visualized. No evidence of pulmonic stenosis. Aorta: The aortic root is normal in size and structure. Venous: The inferior vena cava is dilated in size with greater than 50% respiratory variability, suggesting right atrial pressure of 8 mmHg. IAS/Shunts: No atrial level shunt detected by color flow Doppler.  LEFT VENTRICLE PLAX 2D LVIDd:         4.70 cm   Diastology LVIDs:         2.80 cm   LV e' medial:    12.20 cm/s LV PW:         0.80 cm   LV E/e' medial:  8.1 LV IVS:        0.90 cm   LV e' lateral:   17.10 cm/s LVOT diam:     2.00 cm   LV  E/e' lateral: 5.8 LV SV:         80 LV SV Index:   43 LVOT Area:     3.14 cm  RIGHT VENTRICLE RV S prime:     18.20 cm/s TAPSE (M-mode): 2.5 cm LEFT ATRIUM  Index        RIGHT ATRIUM           Index LA diam:        3.60 cm 1.91 cm/m   RA Area:     13.00 cm LA Vol (A2C):   58.1 ml 30.82 ml/m  RA Volume:   32.90 ml  17.45 ml/m LA Vol (A4C):   45.6 ml 24.19 ml/m LA Biplane Vol: 53.8 ml 28.54 ml/m  AORTIC VALVE LVOT Vmax:   131.00 cm/s LVOT Vmean:  81.300 cm/s LVOT VTI:    0.256 m  AORTA Ao Root diam: 2.90 cm MITRAL VALVE MV Area (PHT): 2.45 cm    SHUNTS MV Area VTI:   0.53 cm    Systemic VTI:  0.26 m MV Peak grad:  96.8 mmHg   Systemic Diam: 2.00 cm MV Mean grad:  64.0 mmHg MV Vmax:       4.92 m/s MV Vmean:      381.0 cm/s MV Decel Time: 310 msec MV E velocity: 99.00 cm/s MV A velocity: 97.10 cm/s MV E/A ratio:  1.02 Kirk Ruths MD Electronically signed by Kirk Ruths MD Signature Date/Time: 10/05/2022/11:17:03 AM    Final    MR BRAIN WO CONTRAST  Result Date: 10/04/2022 CLINICAL DATA:  Stroke-like symptoms EXAM: MRI HEAD WITHOUT CONTRAST MRA HEAD WITHOUT CONTRAST TECHNIQUE: Multiplanar, multi-echo pulse sequences of the brain and surrounding structures were acquired without intravenous contrast. Angiographic images of the Circle of Willis were acquired using MRA technique without intravenous contrast. COMPARISON:  05/18/2007 MRI head, correlation is made with CTA head and neck 10/03/2022 FINDINGS: MRI HEAD FINDINGS Brain: No restricted diffusion to suggest acute or subacute infarct. No acute hemorrhage, mass, mass effect, or midline shift. No hydrocephalus or extra-axial collection. No hemosiderin deposition to suggest remote hemorrhage. Cerebral volume is normal for age. Vascular: Normal arterial flow voids. Please see MRA findings below. Skull and upper cervical spine: Normal marrow signal. Sinuses/Orbits: Clear paranasal sinuses. No acute finding in the orbits. Other: The mastoids are  well aerated. MRA HEAD FINDINGS Anterior circulation: Both internal carotid arteries are patent to the termini, without significant stenosis. A1 segments patent. Normal anterior communicating artery. Anterior cerebral arteries are patent to their distal aspects. No M1 stenosis or occlusion. Normal MCA bifurcations. Distal MCA branches perfused and symmetric. Posterior circulation: Vertebral arteries patent to the vertebrobasilar junction without stenosis. Posterior inferior cerebral arteries patent bilaterally. Basilar patent to its distal aspect. Superior cerebellar arteries patent bilaterally. Patent P1 segments. PCAs perfused to their distal aspects without stenosis. The bilateral posterior communicating arteries are patent. Anatomic variants: None significant IMPRESSION: 1. No acute intracranial process. No evidence of acute or subacute infarct. 2. No intracranial large vessel occlusion or significant stenosis. Previously seen narrowing of the MCAs and PCAs on the CTA is not apparent on this exam and may have been secondary to poor vascular opacification. Electronically Signed   By: Merilyn Baba M.D.   On: 10/04/2022 21:55   MR ANGIO HEAD WO CONTRAST  Result Date: 10/04/2022 CLINICAL DATA:  Stroke-like symptoms EXAM: MRI HEAD WITHOUT CONTRAST MRA HEAD WITHOUT CONTRAST TECHNIQUE: Multiplanar, multi-echo pulse sequences of the brain and surrounding structures were acquired without intravenous contrast. Angiographic images of the Circle of Willis were acquired using MRA technique without intravenous contrast. COMPARISON:  05/18/2007 MRI head, correlation is made with CTA head and neck 10/03/2022 FINDINGS: MRI HEAD FINDINGS Brain: No restricted diffusion to suggest acute or subacute infarct. No acute hemorrhage, mass,  mass effect, or midline shift. No hydrocephalus or extra-axial collection. No hemosiderin deposition to suggest remote hemorrhage. Cerebral volume is normal for age. Vascular: Normal arterial flow  voids. Please see MRA findings below. Skull and upper cervical spine: Normal marrow signal. Sinuses/Orbits: Clear paranasal sinuses. No acute finding in the orbits. Other: The mastoids are well aerated. MRA HEAD FINDINGS Anterior circulation: Both internal carotid arteries are patent to the termini, without significant stenosis. A1 segments patent. Normal anterior communicating artery. Anterior cerebral arteries are patent to their distal aspects. No M1 stenosis or occlusion. Normal MCA bifurcations. Distal MCA branches perfused and symmetric. Posterior circulation: Vertebral arteries patent to the vertebrobasilar junction without stenosis. Posterior inferior cerebral arteries patent bilaterally. Basilar patent to its distal aspect. Superior cerebellar arteries patent bilaterally. Patent P1 segments. PCAs perfused to their distal aspects without stenosis. The bilateral posterior communicating arteries are patent. Anatomic variants: None significant IMPRESSION: 1. No acute intracranial process. No evidence of acute or subacute infarct. 2. No intracranial large vessel occlusion or significant stenosis. Previously seen narrowing of the MCAs and PCAs on the CTA is not apparent on this exam and may have been secondary to poor vascular opacification. Electronically Signed   By: Merilyn Baba M.D.   On: 10/04/2022 21:55   CT ANGIO HEAD NECK W WO CM (CODE STROKE)  Result Date: 10/03/2022 CLINICAL DATA:  Stroke-like symptoms EXAM: CT ANGIOGRAPHY HEAD AND NECK TECHNIQUE: Multidetector CT imaging of the head and neck was performed using the standard protocol during bolus administration of intravenous contrast. Multiplanar CT image reconstructions and MIPs were obtained to evaluate the vascular anatomy. Carotid stenosis measurements (when applicable) are obtained utilizing NASCET criteria, using the distal internal carotid diameter as the denominator. RADIATION DOSE REDUCTION: This exam was performed according to the  departmental dose-optimization program which includes automated exposure control, adjustment of the mA and/or kV according to patient size and/or use of iterative reconstruction technique. CONTRAST:  6m OMNIPAQUE IOHEXOL 350 MG/ML SOLN COMPARISON:  No prior CTA available, correlation is made with CT head 10/03/2022 FINDINGS: CT HEAD FINDINGS For noncontrast findings, please see same day CT head. CTA NECK FINDINGS Aortic arch: Standard branching. Imaged portion shows no evidence of aneurysm or dissection. No significant stenosis of the major arch vessel origins. Right carotid system: No evidence of stenosis, dissection, or occlusion. Left carotid system: No evidence of stenosis, dissection, or occlusion. Vertebral arteries: No evidence of stenosis, dissection, or occlusion. Skeleton: No acute osseous abnormality. Other neck: No acute finding. Upper chest: No focal pulmonary opacity or pleural effusion. Review of the MIP images confirms the above findings CTA HEAD FINDINGS Evaluation is somewhat limited by bolus timing. Anterior circulation: Both internal carotid arteries are patent to the termini, without significant stenosis. A1 segments patent. Normal anterior communicating artery. Anterior cerebral arteries are patent to their distal aspects. The bilateral MCAs appear somewhat narrow, without evidence of downstream dilatation. No focal M1 stenosis or occlusion. MCA branches perfused and symmetric. Posterior circulation: Vertebral arteries patent to the vertebrobasilar junction without stenosis. Posterior inferior cerebellar arteries not well seen. Basilar patent to its distal aspect. Superior cerebellar arteries patent proximally. Patent P1 segments. The bilateral PCAs appear somewhat narrow, without evidence of downstream dilatation, but appear to be perfused proximally. Evaluation of the distal PCAs is limited by venous contamination. Venous sinuses: Patent. Anatomic variants: None significant. Review of the  MIP images confirms the above findings IMPRESSION: 1. No large vessel occlusion or significant stenosis in the neck. 2. The bilateral  MCAs and PCAs appear somewhat narrow, without evidence of downstream dilatation, but appear to be perfused proximally. This could be secondary to bolus timing, but a vasculitis or reversible cerebral vasoconstriction syndrome (RCVS) is not excluded. Electronically Signed   By: Merilyn Baba M.D.   On: 10/03/2022 20:05   CT HEAD CODE STROKE WO CONTRAST  Result Date: 10/03/2022 CLINICAL DATA:  Code stroke. Neuro deficit, acute, stroke suspected. Left-sided facial droop. Right-sided weakness. EXAM: CT HEAD WITHOUT CONTRAST TECHNIQUE: Contiguous axial images were obtained from the base of the skull through the vertex without intravenous contrast. RADIATION DOSE REDUCTION: This exam was performed according to the departmental dose-optimization program which includes automated exposure control, adjustment of the mA and/or kV according to patient size and/or use of iterative reconstruction technique. COMPARISON:  Brain MRI 05/18/2007.  Head CT 05/18/2007. FINDINGS: Brain: Cerebral volume is normal. There is no acute intracranial hemorrhage. No demarcated cortical infarct. No extra-axial fluid collection. No evidence of an intracranial mass. No midline shift. Vascular: No hyperdense vessel. Skull: No fracture or aggressive osseous lesion. Sinuses/Orbits: No mass or acute finding within the imaged orbits. No significant paranasal sinus disease at the imaged levels. ASPECTS [facility] Stroke Program Early CT Score) - Ganglionic level infarction (caudate, lentiform nuclei, internal capsule, insula, M1-M3 cortex): 7 - Supraganglionic infarction (M4-M6 cortex): 3 Total score (0-10 with 10 being normal): 10 No evidence of an acute intracranial abnormality. These results were called by telephone at the time of interpretation on 10/03/2022 at 7:03 pm to provider ADAM CURATOLO , who verbally  acknowledged these results. IMPRESSION: No evidence of an acute intracranial abnormality. ASPECTS is 10. Electronically Signed   By: Kellie Simmering D.O.   On: 10/03/2022 19:04     PHYSICAL EXAM  Temp:  [97.6 F (36.4 C)-98.3 F (36.8 C)] 98 F (36.7 C) (02/20 1100) Pulse Rate:  [71-94] 91 (02/20 1100) Resp:  [16-18] 18 (02/20 1100) BP: (102-134)/(58-83) 112/77 (02/20 1100) SpO2:  [98 %-100 %] 99 % (02/20 1100) Weight:  [82.3 kg] 82.3 kg (02/20 0750)  General - Well nourished, well developed, in no acute distress  Cardiovascular - Regular rhythm and rate.  Neuro - awake, alert, eyes open, orientated to age, place, time and people. There is no hesitant speech present on exam today. No aphasia, no dysarthria today, following all simple commands. B/l eyes lateral gaze incomplete with diplopia, significantly improved per patient with right lense taping, tracking bilaterally but cranial nerve VI palsy observed bilaterally, left homonymous hemianopia, PERRL. Left subtle facial droop, but symmetrical closing eyes.  Tongue midline BUEs against gravity with RUE drift but no pronation. BLEs against gravity with some weakness on the right side. Sensation decreased light touch on the right subjectively (improved on the right face and arm with minimal decreased sensation to light touch but consistent decreased sensation to light touch on the right lower extremity today), b/l FTN intact grossly no ataxia but slow on action, gait not tested.   ASSESSMENT/PLAN Donna Mccarthy is a 47 y.o. female with history of recent COVID 2 weeks ago, migraine admitted for slurred speech, blurry vision, double vision, left facial droop during driving.  TNK given at med center [facility].  MRI was negative, suspect small brainstem stroke unable to be seen on MRI.  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 source CT no acute abnormality CT head and neck questionable bilateral MCA  and PCA narrowing MRI no acute abnormality MRA no LVO or hemodynamically  significant stenosis 2D Echo EF 60 to 65%, normal left atrial size, no atrial level shunt TEE unremarkable except possible positive small PFO and intrapulmonary shunt, recommend TCD bubble study TCD bubble study pending LDL 83 HgbA1c 5.0 Hypercoag lab largely negative  ANA/B12/TSH/RPR pending UDS positive for opiates SCD's for VTE prophylaxis No antithrombotic prior to admission, now on aspirin 81 mg daily and Plavix 75 mg daily DAPT for 3 weeks and then ASA alone Ongoing aggressive stroke risk factor management Therapy recommendations: CIR  Disposition: Pending insurance authorization for placement   Hypertension Stable Long term BP goal normotensive  Hyperlipidemia Home meds: None LDL 83, goal < 70 Now on Lipitor 20, no high intensity statin given LDL near goal Continue statin at discharge  Other Active Problems Migraine on Topamax Recent COVID infection Hypokalemia K 3.4 replaced will recheck in am   Hospital day # 4  Patient seen by NP and then by MD, MD to edit note is needed  Anibal Henderson, AGACNP-BC [facility] Pager: [phone]  ATTENDING NOTE: I reviewed above note and agree with the assessment and plan. Pt was seen and examined.   No family at the bedside. Pt sitting in bed AAO x 3, b/l CN VI partial palsy continue to improve, near resolution, still complains of mild diplopia with b/l gaze, but much improved. Off eyeglass occluder. Moving all extremities, slight RUE and RLE weakness but able to walk with PT with walker without dragging. Pending CIR. TEE today showed possible small PFO but not sure, will do TCD bubble study in am. Hypercoagulable work up negative. Pending ANA/TSH/B12/RPR. Continue DAPT and statin.   For detailed assessment and plan, please refer to above/below as I have made changes wherever appropriate.   Rosalin Hawking, MD PhD Stroke  Neurology 10/07/2022 10:32 PM    To contact Stroke Continuity provider, please refer to [email or web address] After hours, contact General Neurology
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
## 2022-10-07 NOTE — Progress Notes (Signed)
Pt transported to endo at this time for TEE.

## 2022-10-07 NOTE — Progress Notes (Signed)
  Echocardiogram Echocardiogram Transesophageal has been performed.  Donna Mccarthy 10/07/2022, 4:14 PM

## 2022-10-07 NOTE — CV Procedure (Addendum)
     Transesophageal Echocardiogram Note  Donna Mccarthy [id number] 01/22/1976  Procedure: Transesophageal Echocardiogram Indications: Stroke  Procedure Details Consent: Obtained Time Out: Verified patient identification, verified procedure, site/side was marked, verified correct patient position, special equipment/implants available, Radiology Safety Procedures followed,  medications/allergies/relevent history reviewed, required imaging and test results available.  Performed  Medications: Propofol: 747m  Left Ventrical:  LVEF 60-65%  Mitral Valve: Normal structure, trivial MR  Aortic Valve: Tricuspid, no AI  Tricuspid Valve: Normal structure, trivial TR  Pulmonic Valve: Normal structure, trivial PI  Left Atrium/ Left atrial appendage: No evidence of LAA thrombus  Atrial septum: The interatrial septum is redundant. Multiple agitated saline studies performed. Most bubbles seen late suggestive of intrapulmonary shunting. There were a few bubbles seen on second, third and fourth agitated saline contrast studies after 2-3 cardiac cycles suggestive of small PFO (clip 56). Recommend transcranial doppler for further evaluation.  Aorta: No significant plaque   Complications: No apparent complications Patient did tolerate procedure well.  HFreada Bergeron MD 10/07/2022, 3:34 PM

## 2022-10-07 NOTE — Plan of Care (Signed)
  Problem: Education: Goal: Knowledge of disease or condition will improve Outcome: Progressing Goal: Knowledge of secondary prevention will improve (MUST DOCUMENT ALL) Outcome: Progressing Goal: Knowledge of patient specific risk factors will improve (Mark N/A or DELETE if not current risk factor) Outcome: Progressing   Problem: Ischemic Stroke/TIA Tissue Perfusion: Goal: Complications of ischemic stroke/TIA will be minimized Outcome: Progressing   Problem: Coping: Goal: Will verbalize positive feelings about self Outcome: Progressing Goal: Will identify appropriate support needs Outcome: Progressing   Problem: Health Behavior/Discharge Planning: Goal: Ability to manage health-related needs will improve Outcome: Progressing Goal: Goals will be collaboratively established with patient/family Outcome: Progressing   Problem: Self-Care: Goal: Ability to participate in self-care as condition permits will improve Outcome: Progressing Goal: Verbalization of feelings and concerns over difficulty with self-care will improve Outcome: Progressing Goal: Ability to communicate needs accurately will improve Outcome: Progressing   Problem: Nutrition: Goal: Risk of aspiration will decrease Outcome: Progressing Goal: Dietary intake will improve Outcome: Progressing   

## 2022-10-08 ENCOUNTER — Encounter (HOSPITAL_COMMUNITY): Payer: Self-pay | Admitting: Physical Medicine & Rehabilitation

## 2022-10-08 ENCOUNTER — Inpatient Hospital Stay (HOSPITAL_COMMUNITY)
Admission: RE | Admit: 2022-10-08 | Discharge: 2022-10-22 | DRG: 057 | Disposition: A | Discharge status: Home / Self Care | Payer: 59 | Source: Intra-hospital | Attending: Physical Medicine & Rehabilitation | Admitting: Physical Medicine & Rehabilitation

## 2022-10-08 ENCOUNTER — Other Ambulatory Visit: Payer: Self-pay

## 2022-10-08 ENCOUNTER — Inpatient Hospital Stay (HOSPITAL_COMMUNITY): Payer: 59

## 2022-10-08 ENCOUNTER — Other Ambulatory Visit (HOSPITAL_COMMUNITY): Payer: Self-pay

## 2022-10-08 ENCOUNTER — Encounter (HOSPITAL_COMMUNITY): Payer: Self-pay | Admitting: Neurology

## 2022-10-08 DIAGNOSIS — F411 Generalized anxiety disorder: Secondary | ICD-10-CM | POA: Diagnosis not present

## 2022-10-08 DIAGNOSIS — F4001 Agoraphobia with panic disorder: Secondary | ICD-10-CM | POA: Diagnosis not present

## 2022-10-08 DIAGNOSIS — I69322 Dysarthria following cerebral infarction: Secondary | ICD-10-CM

## 2022-10-08 DIAGNOSIS — I69312 Visuospatial deficit and spatial neglect following cerebral infarction: Secondary | ICD-10-CM | POA: Diagnosis not present

## 2022-10-08 DIAGNOSIS — K219 Gastro-esophageal reflux disease without esophagitis: Secondary | ICD-10-CM | POA: Diagnosis present

## 2022-10-08 DIAGNOSIS — Z880 Allergy status to penicillin: Secondary | ICD-10-CM

## 2022-10-08 DIAGNOSIS — Z888 Allergy status to other drugs, medicaments and biological substances status: Secondary | ICD-10-CM

## 2022-10-08 DIAGNOSIS — M79661 Pain in right lower leg: Secondary | ICD-10-CM | POA: Diagnosis not present

## 2022-10-08 DIAGNOSIS — E876 Hypokalemia: Secondary | ICD-10-CM | POA: Diagnosis not present

## 2022-10-08 DIAGNOSIS — Z6830 Body mass index (BMI) 30.0-30.9, adult: Secondary | ICD-10-CM

## 2022-10-08 DIAGNOSIS — Z79890 Hormone replacement therapy (postmenopausal): Secondary | ICD-10-CM

## 2022-10-08 DIAGNOSIS — I959 Hypotension, unspecified: Secondary | ICD-10-CM | POA: Diagnosis present

## 2022-10-08 DIAGNOSIS — I69392 Facial weakness following cerebral infarction: Secondary | ICD-10-CM

## 2022-10-08 DIAGNOSIS — E785 Hyperlipidemia, unspecified: Secondary | ICD-10-CM | POA: Diagnosis present

## 2022-10-08 DIAGNOSIS — Z7982 Long term (current) use of aspirin: Secondary | ICD-10-CM

## 2022-10-08 DIAGNOSIS — M170 Bilateral primary osteoarthritis of knee: Secondary | ICD-10-CM | POA: Diagnosis present

## 2022-10-08 DIAGNOSIS — Z83438 Family history of other disorder of lipoprotein metabolism and other lipidemia: Secondary | ICD-10-CM

## 2022-10-08 DIAGNOSIS — Z7985 Long-term (current) use of injectable non-insulin antidiabetic drugs: Secondary | ICD-10-CM

## 2022-10-08 DIAGNOSIS — H532 Diplopia: Secondary | ICD-10-CM | POA: Diagnosis present

## 2022-10-08 DIAGNOSIS — I639 Cerebral infarction, unspecified: Secondary | ICD-10-CM

## 2022-10-08 DIAGNOSIS — E039 Hypothyroidism, unspecified: Secondary | ICD-10-CM | POA: Diagnosis present

## 2022-10-08 DIAGNOSIS — I69351 Hemiplegia and hemiparesis following cerebral infarction affecting right dominant side: Principal | ICD-10-CM

## 2022-10-08 DIAGNOSIS — E669 Obesity, unspecified: Secondary | ICD-10-CM | POA: Diagnosis present

## 2022-10-08 DIAGNOSIS — I69328 Other speech and language deficits following cerebral infarction: Secondary | ICD-10-CM | POA: Diagnosis not present

## 2022-10-08 DIAGNOSIS — K5900 Constipation, unspecified: Secondary | ICD-10-CM

## 2022-10-08 DIAGNOSIS — G43909 Migraine, unspecified, not intractable, without status migrainosus: Secondary | ICD-10-CM | POA: Diagnosis present

## 2022-10-08 DIAGNOSIS — F5104 Psychophysiologic insomnia: Secondary | ICD-10-CM | POA: Diagnosis present

## 2022-10-08 DIAGNOSIS — G43809 Other migraine, not intractable, without status migrainosus: Secondary | ICD-10-CM

## 2022-10-08 DIAGNOSIS — F321 Major depressive disorder, single episode, moderate: Secondary | ICD-10-CM

## 2022-10-08 DIAGNOSIS — D709 Neutropenia, unspecified: Secondary | ICD-10-CM | POA: Diagnosis not present

## 2022-10-08 DIAGNOSIS — M79604 Pain in right leg: Secondary | ICD-10-CM | POA: Diagnosis not present

## 2022-10-08 DIAGNOSIS — I69398 Other sequelae of cerebral infarction: Secondary | ICD-10-CM

## 2022-10-08 DIAGNOSIS — Z8616 Personal history of COVID-19: Secondary | ICD-10-CM | POA: Diagnosis not present

## 2022-10-08 DIAGNOSIS — Z79899 Other long term (current) drug therapy: Secondary | ICD-10-CM

## 2022-10-08 DIAGNOSIS — Z975 Presence of (intrauterine) contraceptive device: Secondary | ICD-10-CM

## 2022-10-08 DIAGNOSIS — I950 Idiopathic hypotension: Secondary | ICD-10-CM | POA: Diagnosis not present

## 2022-10-08 DIAGNOSIS — E559 Vitamin D deficiency, unspecified: Secondary | ICD-10-CM | POA: Diagnosis present

## 2022-10-08 DIAGNOSIS — I6389 Other cerebral infarction: Secondary | ICD-10-CM | POA: Diagnosis not present

## 2022-10-08 LAB — BASIC METABOLIC PANEL
Anion gap: 8 (ref 5–15)
BUN: 7 mg/dL (ref 6–20)
CO2: 22 mmol/L (ref 22–32)
Calcium: 9.1 mg/dL (ref 8.9–10.3)
Chloride: 107 mmol/L (ref 98–111)
Creatinine, Ser: 0.66 mg/dL (ref 0.44–1.00)
GFR, Estimated: >60 mL/min (ref >60)
Glucose, Bld: 123 mg/dL — ABNORMAL HIGH (ref 70–99)
Potassium: 3.8 mmol/L (ref 3.5–5.1)
Sodium: 137 mmol/L (ref 135–145)

## 2022-10-08 LAB — CBC
HCT: 36.6 % (ref 36.0–46.0)
Hemoglobin: 11.4 g/dL — ABNORMAL LOW (ref 12.0–15.0)
MCH: 26 pg (ref 26.0–34.0)
MCHC: 31.1 g/dL (ref 30.0–36.0)
MCV: 83.6 fL (ref 80.0–100.0)
Platelets: 171 10*3/uL (ref 150–400)
RBC: 4.38 MIL/uL (ref 3.87–5.11)
RDW: 13.9 % (ref 11.5–15.5)
WBC: 3.8 10*3/uL — ABNORMAL LOW (ref 4.0–10.5)
nRBC: 0 % (ref 0.0–0.2)

## 2022-10-08 LAB — LUPUS ANTICOAGULANT PANEL
DRVVT: 32 s (ref 0.0–47.0)
PTT Lupus Anticoagulant: 25.9 s (ref 0.0–43.5)

## 2022-10-08 LAB — TSH: TSH: 0.015 u[IU]/mL — ABNORMAL LOW (ref 0.350–4.500)

## 2022-10-08 LAB — RPR: RPR Ser Ql: NONREACTIVE

## 2022-10-08 LAB — VITAMIN B12: Vitamin B-12: 509 pg/mL (ref 180–914)

## 2022-10-08 MED ORDER — ACETAMINOPHEN 325 MG PO TABS
325.0000 mg | ORAL_TABLET | ORAL | Status: DC | PRN
  Filled 2022-10-08: qty 2

## 2022-10-08 MED ORDER — GUAIFENESIN-DM 100-10 MG/5ML PO SYRP: 5.0000 mL | ORAL_SOLUTION | Freq: Four times a day (QID) | ORAL | Status: DC | PRN

## 2022-10-08 MED ORDER — DIPHENHYDRAMINE HCL 12.5 MG/5ML PO ELIX
12.5000 mg | ORAL_SOLUTION | Freq: Four times a day (QID) | ORAL | Status: DC | PRN
  Administered 2022-10-17: 12.5 mg via ORAL
  Administered 2022-10-18: 25 mg via ORAL
  Filled 2022-10-08: qty 10
  Filled 2022-10-08: qty 0

## 2022-10-08 MED ORDER — ASPIRIN 81 MG PO CHEW
81.0000 mg | CHEWABLE_TABLET | Freq: Every day | ORAL | Status: DC
  Administered 2022-10-09 – 2022-10-22 (×14): 81 mg via ORAL
  Filled 2022-10-08 (×14): qty 1

## 2022-10-08 MED ORDER — ASPIRIN 81 MG PO CHEW
81.0000 mg | CHEWABLE_TABLET | Freq: Every day | ORAL | 0 refills | Status: DC
  Filled 2022-10-08: qty 30, 30d supply, fill #0

## 2022-10-08 MED ORDER — EXERCISE FOR HEART AND HEALTH BOOK
Freq: Once | Status: AC
  Filled 2022-10-08: qty 1

## 2022-10-08 MED ORDER — ENOXAPARIN SODIUM 40 MG/0.4ML IJ SOSY
40.0000 mg | PREFILLED_SYRINGE | INTRAMUSCULAR | Status: DC
  Administered 2022-10-09 – 2022-10-18 (×10): 40 mg via SUBCUTANEOUS
  Filled 2022-10-08 (×10): qty 0.4

## 2022-10-08 MED ORDER — BUTALBITAL-APAP-CAFFEINE 50-325-40 MG PO TABS
1.0000 | ORAL_TABLET | Freq: Two times a day (BID) | ORAL | Status: DC | PRN
  Administered 2022-10-14: 1 via ORAL
  Filled 2022-10-08: qty 1

## 2022-10-08 MED ORDER — TOPIRAMATE 25 MG PO TABS
75.0000 mg | ORAL_TABLET | Freq: Every day | ORAL | Status: DC
  Administered 2022-10-08 – 2022-10-21 (×14): 75 mg via ORAL
  Filled 2022-10-08 (×14): qty 3

## 2022-10-08 MED ORDER — FLEET ENEMA 7-19 GM/118ML RE ENEM: 1.0000 | ENEMA | Freq: Once | RECTAL | Status: DC | PRN

## 2022-10-08 MED ORDER — ATORVASTATIN CALCIUM 10 MG PO TABS
20.0000 mg | ORAL_TABLET | Freq: Every day | ORAL | Status: DC
  Administered 2022-10-09 – 2022-10-22 (×14): 20 mg via ORAL
  Filled 2022-10-08 (×14): qty 2

## 2022-10-08 MED ORDER — ENOXAPARIN SODIUM 40 MG/0.4ML IJ SOSY
40.0000 mg | PREFILLED_SYRINGE | INTRAMUSCULAR | 0 refills | Status: DC
  Filled 2022-10-08: qty 1.2, 3d supply, fill #0

## 2022-10-08 MED ORDER — TRAZODONE HCL 50 MG PO TABS
25.0000 mg | ORAL_TABLET | Freq: Every evening | ORAL | Status: DC | PRN
  Administered 2022-10-14: 25 mg via ORAL
  Filled 2022-10-08: qty 1

## 2022-10-08 MED ORDER — PROCHLORPERAZINE 25 MG RE SUPP: 12.5000 mg | Freq: Four times a day (QID) | RECTAL | Status: DC | PRN

## 2022-10-08 MED ORDER — VITAMIN D (ERGOCALCIFEROL) 1.25 MG (50000 UNIT) PO CAPS
50000.0000 [IU] | ORAL_CAPSULE | ORAL | Status: DC
  Administered 2022-10-08 – 2022-10-15 (×2): 50000 [IU] via ORAL
  Filled 2022-10-08 (×2): qty 1

## 2022-10-08 MED ORDER — CLOPIDOGREL BISULFATE 75 MG PO TABS
75.0000 mg | ORAL_TABLET | Freq: Every day | ORAL | Status: DC
  Administered 2022-10-09 – 2022-10-22 (×14): 75 mg via ORAL
  Filled 2022-10-08 (×14): qty 1

## 2022-10-08 MED ORDER — PROCHLORPERAZINE EDISYLATE 10 MG/2ML IJ SOLN: 5.0000 mg | Freq: Four times a day (QID) | INTRAMUSCULAR | Status: DC | PRN

## 2022-10-08 MED ORDER — THYROID 180 MG PO TABS
180.0000 mg | ORAL_TABLET | Freq: Every day | ORAL | 0 refills | Status: DC
  Filled 2022-10-08: qty 30, 30d supply, fill #0

## 2022-10-08 MED ORDER — ORAL CARE MOUTH RINSE: 15.0000 mL | OROMUCOSAL | Status: DC | PRN

## 2022-10-08 MED ORDER — BUPROPION HCL ER (XL) 300 MG PO TB24
300.0000 mg | ORAL_TABLET | Freq: Every day | ORAL | Status: DC
  Administered 2022-10-09 – 2022-10-22 (×14): 300 mg via ORAL
  Filled 2022-10-08 (×15): qty 1

## 2022-10-08 MED ORDER — POLYETHYLENE GLYCOL 3350 17 G PO PACK: 17.0000 g | PACK | Freq: Every day | ORAL | Status: DC | PRN

## 2022-10-08 MED ORDER — ENOXAPARIN SODIUM 40 MG/0.4ML IJ SOSY: 40.0000 mg | PREFILLED_SYRINGE | INTRAMUSCULAR | Status: DC

## 2022-10-08 MED ORDER — POTASSIUM CHLORIDE CRYS ER 20 MEQ PO TBCR
20.0000 meq | EXTENDED_RELEASE_TABLET | Freq: Two times a day (BID) | ORAL | Status: DC
  Administered 2022-10-08: 20 meq via ORAL
  Filled 2022-10-08: qty 1

## 2022-10-08 MED ORDER — BUTALBITAL-APAP-CAFFEINE 50-325-40 MG PO TABS
1.0000 | ORAL_TABLET | Freq: Two times a day (BID) | ORAL | Status: DC | PRN
  Administered 2022-10-08: 1 via ORAL
  Filled 2022-10-08: qty 1

## 2022-10-08 MED ORDER — PROCHLORPERAZINE MALEATE 5 MG PO TABS: 5.0000 mg | ORAL_TABLET | Freq: Four times a day (QID) | ORAL | Status: DC | PRN

## 2022-10-08 MED ORDER — THYROID 60 MG PO TABS
180.0000 mg | ORAL_TABLET | Freq: Every day | ORAL | Status: DC
  Administered 2022-10-09 – 2022-10-19 (×11): 180 mg via ORAL
  Filled 2022-10-08 (×11): qty 3

## 2022-10-08 MED ORDER — SENNOSIDES-DOCUSATE SODIUM 8.6-50 MG PO TABS: 1.0000 | ORAL_TABLET | Freq: Every evening | ORAL | Status: DC | PRN

## 2022-10-08 MED ORDER — ATORVASTATIN CALCIUM 20 MG PO TABS
20.0000 mg | ORAL_TABLET | Freq: Every day | ORAL | 0 refills | Status: DC
  Filled 2022-10-08: qty 30, 30d supply, fill #0

## 2022-10-08 MED ORDER — POTASSIUM CHLORIDE CRYS ER 20 MEQ PO TBCR
20.0000 meq | EXTENDED_RELEASE_TABLET | Freq: Two times a day (BID) | ORAL | Status: AC
  Administered 2022-10-08: 20 meq via ORAL
  Filled 2022-10-08: qty 1

## 2022-10-08 MED ORDER — BISACODYL 10 MG RE SUPP: 10.0000 mg | Freq: Every day | RECTAL | Status: DC | PRN

## 2022-10-08 MED ORDER — CLOPIDOGREL BISULFATE 75 MG PO TABS
75.0000 mg | ORAL_TABLET | Freq: Every day | ORAL | 0 refills | Status: DC
  Filled 2022-10-08: qty 21, 21d supply, fill #0

## 2022-10-08 MED ORDER — ALUM & MAG HYDROXIDE-SIMETH 200-200-20 MG/5ML PO SUSP: 30.0000 mL | ORAL | Status: DC | PRN

## 2022-10-08 MED ORDER — PANTOPRAZOLE SODIUM 40 MG PO TBEC
40.0000 mg | DELAYED_RELEASE_TABLET | Freq: Every day | ORAL | Status: DC
  Administered 2022-10-08 – 2022-10-21 (×14): 40 mg via ORAL
  Filled 2022-10-08 (×14): qty 1
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
## 2022-10-08 NOTE — Progress Notes (Signed)
PMR Admission Coordinator Pre-Admission Assessment   Patient: Donna Mccarthy is an 47 y.o., female MRN: [id number] DOB: [birth year] Height: 5' 5"$  (165.1 cm) Weight: 82.3 kg                                                                                                                                                  Insurance Information HMO:     PPO:      PCP:      IPA:      80/20:      OTHER: EPO PRIMARY: Leanna Sato)      Policy#: [id number]      Subscriber:  CM Name: Claiborne Billings      Phone#: [phone]     Fax#: [phone] Pre-Cert#: [id number] [place] for CIR provided by Claiborne Billings with [employer] for admission with updates due 2/25 (5 day approval)      Employer: [employer] Benefits:  Phone #:      Name:  Eff. Date: 08/18/22     Deduct: $100 (met $100)      Out of Pocket Max: $2500 ($250 met)      Life Max: n/a  CIR: $1150 copay      SNF: 80% Outpatient: 80%     Co-Pay: 20% Home Health: 80%      Co-Pay: 20% DME: 80%     Co-Pay: 20% Providers:  SECONDARY:       Policy#:       Phone#:    Development worker, community:       Phone#:    The Therapist, art Information Summary" for patients in Inpatient Rehabilitation Facilities with attached "Privacy Act [place] Records" was provided and verbally reviewed with: N/A   Emergency Contact Information Contact Information       Name Relation Home Work Mobile    [place] Father [phone]        Janine Limbo Mother     [place], [place] Denman George     [phone]    LANAE, SUDAN     [phone]         Current Medical History  Patient Admitting Diagnosis: CVA    History of Present Illness: Pt is a 47 y/o famel with PMH of recent covid, GERD, hypothyroidism, and anxiety/depression, admitted to [facility] on 10/03/2022 with stroke like symptoms.  In ED labs notable for hgb 11.5, vitals WNL.  Head CT negative.  CTA head/neck unremarkable.  Neurology was consulted and pt was given TNK and BP control  initiated.  MRI showed no acute abnormality.  MRA unremarkable for LVO.  TEE unremarkable except for possible small PFO and intrapulmonary shunt.  TCD bubble study pending.  Neurology recommended asa 81 mg daily and plavix 75 mg daily for DAPT x3 weeks, then asa alone.  Therapy ongoing and pt was recommended for CIR.  Complete NIHSS TOTAL: 4 Glasgow Coma Scale Score: 15   [facility]'s [facility] record from [facility] has been reviewed by the rehabilitation admission coordinator and physician.   Past Medical History      Past Medical History:  Diagnosis Date   Allergy     Anemia     Anxiety     Arthritis      arthritis knees   Blood transfusion without reported diagnosis     GERD (gastroesophageal reflux disease)     Hypothyroidism     Neuromuscular disorder (HCC)     Obesity (BMI 30-39.9) 01/05/2019      Has the patient had major surgery during 100 days prior to admission? No   Family History  family history includes Breast cancer in her maternal aunt and maternal grandmother; Colon cancer in her maternal uncle; Colon cancer (age of onset: 32) in her maternal uncle; Colon polyps in her mother; Crohn's disease in her mother; Diabetes in her maternal grandmother; Heart attack in her paternal grandfather; Heart failure in her maternal grandmother and paternal grandfather; Hyperlipidemia in her father; Hypertension in her brother, father, and mother; Stroke in her father; Thyroid disease in her paternal grandmother; Vitamin D deficiency in her paternal grandmother.     Current Medications    Current Facility-Administered Medications:    acetaminophen (TYLENOL) tablet 650 mg, 650 mg, Oral, Q4H PRN, 650 mg at 10/08/22 0828 **OR** acetaminophen (TYLENOL) 160 MG/5ML solution 650 mg, 650 mg, Per Tube, Q4H PRN **OR** acetaminophen (TYLENOL) suppository 650 mg, 650 mg, Rectal, Q4H PRN, Donnetta Simpers, MD   aspirin chewable tablet 81 mg, 81 mg, Oral, Daily, de [place] Tor[place], [place] E, NP, 81 mg at  10/08/22 [id number]   atorvastatin (LIPITOR) tablet 20 mg, 20 mg, Oral, Daily, Rosalin Hawking, MD, 20 mg at 10/08/22 [id number]   buPROPion (WELLBUTRIN XL) 24 hr tablet 300 mg, 300 mg, Oral, Daily, Beulah Gandy A, NP, 300 mg at 10/08/22 [id number]   Chlorhexidine Gluconate Cloth 2 % PADS 6 each, 6 each, Topical, Daily, Donnetta Simpers, MD, 6 each at 10/08/22 0830   clopidogrel (PLAVIX) tablet 75 mg, 75 mg, Oral, Daily, de Yolanda Manges, [place] E, NP, 75 mg at 10/08/22 0828   enoxaparin (LOVENOX) injection 40 mg, 40 mg, Subcutaneous, Q24H, Rosalin Hawking, MD, 40 mg at 10/07/22 1745   Oral care mouth rinse, 15 mL, Mouth Rinse, PRN, Donnetta Simpers, MD   pantoprazole (PROTONIX) EC tablet 40 mg, 40 mg, Oral, QHS, Donnamae Jude, RPH, 40 mg at 10/07/22 2100   potassium chloride SA (KLOR-CON M) CR tablet 20 mEq, 20 mEq, Oral, BID, Thressa Sheller, Erin C, NP, 20 mEq at 10/08/22 1021   senna-docusate (Senokot-S) tablet 1 tablet, 1 tablet, Oral, QHS PRN, Donnetta Simpers, MD   thyroid (ARMOUR) tablet 180 mg, 180 mg, Oral, Daily, Beulah Gandy A, NP, 180 mg at 10/08/22 [id number]   topiramate (TOPAMAX) tablet 75 mg, 75 mg, Oral, QHS, Donnetta Simpers, MD, 75 mg at 10/07/22 2100   Patients Current Diet:  Diet Order                  Diet regular Room service appropriate? 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  Independent   Indoor  Mobility: Did the patient need assistance with walking from room to room (with or without device)? Independent   Stairs: Did the patient need assistance with internal or external stairs (with or without device)? Independent   Functional Cognition: Did the patient need help planning regular tasks such as shopping or remembering to take medications? Independent   Patient Information Are you of Hispanic, Latino/a,or Spanish origin?: A. No, not of Hispanic, Latino/a, or Spanish origin What is your race?: B. Black or African American Do you need or want an interpreter to communicate with a doctor or health care staff?: 0. No   Patient's Response To:  Health Literacy and Transportation Is the patient able to respond to health literacy and transportation needs?: Yes Health Literacy - How often do you need to have someone help you when you read instructions, pamphlets, or other written material from your doctor or pharmacy?: Never In the past 12 months, has lack of transportation kept you from medical appointments or from getting medications?: No In the past 12 months, has lack of transportation kept you from meetings, work, or from getting things needed for daily living?: No   Development worker, international aid / [place] Devices/Equipment: None Home Equipment: None   Prior Device Use: Indicate devices/aids used by the patient prior to current illness, exacerbation or injury? None of the above   Current Functional Level Cognition   Arousal/Alertness: Awake/alert Overall Cognitive Status: Within Functional Limits for tasks assessed Orientation Level: Oriented X4 General Comments: deliberate speech, able to answer all questions appropriately Attention: Focused, Sustained Focused Attention: Appears intact Sustained Attention: Appears intact Memory: Impaired Memory Impairment: Decreased recall of new information, Decreased short term memory Awareness: Appears intact Executive Function:  Organizing, Self Monitoring Organizing: Impaired    Extremity Assessment (includes Sensation/Coordination)   Upper Extremity Assessment: RUE deficits/detail RUE Deficits / Details: strength grossly 4/5, decreased fine motor coordination  Lower Extremity Assessment: RLE deficits/detail RLE Deficits / Details: AAROM WFL (supine, somewhat limited by reports of new knee pain); knee extension 3/5, ankle DF 5     ADLs   Overall ADL's : Needs assistance/impaired Eating/Feeding: Supervision/ safety, Sitting Eating/Feeding Details (indicate cue type and reason): Pt able to set up lunch tray without physical assist Grooming: Oral care, Supervision/safety, Standing, Wash/dry hands Grooming Details (indicate cue type and reason): Pt able to complete  entire task of brushing teeth including opening plastic wrap to retrieve toothbrush and applying toothpaste to toothbrush. Upper Body Bathing: Min guard, Sitting Lower Body Bathing: Minimal assistance, Sitting/lateral leans Upper Body Dressing : Minimal assistance Upper Body Dressing Details (indicate cue type and reason): doff extra gown like robe Lower Body Dressing: Moderate assistance, Sitting/lateral leans Lower Body Dressing Details (indicate cue type and reason): for socks Toilet Transfer: Min guard, Ambulation, Regular Toilet Toilet Transfer Details (indicate cue type and reason): 1 person HHA Toileting- Clothing Manipulation and Hygiene: Modified independent, Sitting/lateral lean Functional mobility during ADLs: Minimal assistance (1 person HHA) General ADL Comments: R sided weakness, vision impacted     Mobility   Overal bed mobility:  (up in recliner upon  therapy arrival) Bed Mobility: Supine to Sit, Sit to Supine Supine to sit: Min guard, HOB elevated Sit to supine: Min assist (for RLE) General bed mobility comments: up in recliner upon therapy arrival     Transfers   Overall transfer level: Needs assistance Equipment used: None,  Rolling walker (2 wheels) Transfers: Sit to/from Stand Sit to Stand: Min assist Bed to/from chair/wheelchair/BSC transfer type:: Step pivot Step pivot transfers: Min guard General transfer comment: MinA to initially steady     Ambulation / Gait / Stairs / Emergency planning/management officer   Ambulation/Gait Ambulation/Gait assistance: Herbalist (Feet): 105 Feet Assistive device: Rolling walker (2 wheels) Gait Pattern/deviations: Step-through pattern, Decreased stride length, Decreased weight shift to right, Decreased stance time - right General Gait Details: Pt requiring minimal assist, particularly with turns, for balance. Improved R heel strike at initial contact. Cues for equal step lengths, tactile facilitation for R posterior pelvic rotation and L scapula protraction to promote midline. Gait velocity: decreased Gait velocity interpretation: <1.8 ft/sec, indicate of risk for recurrent falls     Posture / Balance Balance Overall balance assessment: Needs assistance Sitting-balance support: No upper extremity supported, Feet supported Sitting balance-Leahy Scale: Good Standing balance support: During functional activity, No upper extremity supported Standing balance-Leahy Scale: Fair     Special needs/care consideration N/a        Previous Home Environment (from acute therapy documentation) Living Arrangements: Alone  Lives With: Alone Available Help at Discharge: Family, Friend(s), Available PRN/intermittently (?Electronics engineer (son) come home from school if 24/7 needed) Type of Home: House (townhouse) Home Layout: Two level, Bed/bath upstairs Alternate Level Stairs-Number of Steps: flight Home Access: Stairs to enter Technical brewer of Steps: 1 Bathroom Shower/Tub: Optometrist: Yes How Accessible: Accessible via walker [place]: No Additional Comments: works as a Technical brewer for [employer]   Discharge  Living Setting Plans for Discharge Living Setting: Patient's home (friends to come stay with her) Type of Home at Discharge:  (townhome) [place]: Two level, 1/2 bath on main level, Able to live on main level with bedroom/bathroom (friend will move bed downstairs) Alternate Level Stairs-Rails: Left Alternate Level Stairs-Number of Steps: full flight Discharge Home Access: Stairs to enter Entrance Stairs-Rails: None Entrance Stairs-Number of Steps: 1 Discharge Bathroom Shower/Tub: Tub/shower unit Discharge Bathroom Toilet: Standard Discharge Bathroom Accessibility: Yes How Accessible: Accessible via walker Does the patient have any problems obtaining your medications?: No   Social/Family/Support Systems Patient Roles: Parent Contact Information: Dahlia Bailiff (college student in [place]) Anticipated Caregiver: Kennyth Lose and Verdene Lennert (friends) Anticipated Caregiver's Contact Information: Kennyth Lose [phone] Ability/Limitations of Caregiver: can provide intermittent supervision, but not full 24/7 Caregiver Availability: Other (Comment) (Verified support Sun/M/T/R/F/Sa, pt still working to verify W support) Discharge Plan Discussed with Primary Caregiver: Yes Is Caregiver In Agreement with Plan?: Yes Does Caregiver/Family have Issues with Lodging/Transportation while Pt is in Rehab?: No     Goals Patient/Family Goal for Rehab: PT/OT supervision to mod I, SLP mod I Expected length of stay: 11-15 days Additional Information: Discharge plan: to patient's home, friend is moving bed to first floor for easier set up.  Pt's cousin, Kennyth Lose, is heading up the task of arranging as close to 24/7 as possible for pt.  With sup-mod I goals should be okay to be intermittent but they are aiming for 24/7.  It will be multiple family members stepping in.  Kennyth Lose is the contact person.  Pt/Family Agrees to Admission and  willing to participate: Yes Program Orientation Provided & Reviewed with  Pt/Caregiver Including Roles  & Responsibilities: Yes  Barriers to Discharge: Insurance for SNF coverage, Home environment access/layout     Decrease burden of Care through IP rehab admission: n/a     Possible need for SNF placement upon discharge: No.  Plan for d/c home to pt's town home, with friends providing near 24/7 assist.       Patient Condition: This patient's condition remains as documented in the consult dated 2/19, in which the Rehabilitation Physician determined and documented that the patient's condition is appropriate for intensive rehabilitative care in an inpatient rehabilitation facility. Will admit to inpatient rehab today.   Preadmission Screen Completed By:  Michel Santee, PT, DPT 10/08/2022 11:06 AM ______________________________________________________________________   Discussed status with Dr. Curlene Dolphin on 10/08/22 at 3:15 PM  and received approval for admission today.   Admission Coordinator:  Michel Santee, PT, DPT time3:15 PM /Date02/21/24

## 2022-10-08 NOTE — PMR Pre-admission (Signed)
PMR Admission Coordinator Pre-Admission Assessment  Patient: Donna Mccarthy is an 47 y.o., female MRN: [id number] DOB: [birth year] Height: 5' 5"$  (165.1 cm) Weight: 82.3 kg              Insurance Information HMO:     PPO:      PCP:      IPA:      80/20:      OTHER: EPO PRIMARY: Leanna Sato)      Policy#: [id number]      Subscriber:  CM Name: Claiborne Billings      Phone#: [phone]     Fax#: [phone] Pre-Cert#: [id number] Belva for CIR provided by Claiborne Billings with [employer] for admission with updates due 2/25 (5 day approval)      Employer: [employer] Benefits:  Phone #:      Name:  Eff. Date: 08/18/22     Deduct: $100 (met $100)      Out of Pocket Max: $2500 ($250 met)      Life Max: n/a  CIR: $1150 copay      SNF: 80% Outpatient: 80%     Co-Pay: 20% Home Health: 80%      Co-Pay: 20% DME: 80%     Co-Pay: 20% Providers:  SECONDARY:       Policy#:       Phone#:   Development worker, community:       Phone#:   The Therapist, art Information Summary" for patients in Inpatient Rehabilitation Facilities with attached "Privacy Act Jerome Records" was provided and verbally reviewed with: N/A  Emergency Contact Information Contact Information     Name Relation Home Work Mobile   [place]ow Father [phone]     Janine Limbo Mother   Tinsman, Pocahontas Denman George   [phone]   ELMIRE, STANBRO   [phone]      Current Medical History  Patient Admitting Diagnosis: CVA   History of Present Illness: Pt is a 47 y/o famel with PMH of recent covid, GERD, hypothyroidism, and anxiety/depression, admitted to [facility] on 10/03/2022 with stroke like symptoms.  In ED labs notable for hgb 11.5, vitals WNL.  Head CT negative.  CTA head/neck unremarkable.  Neurology was consulted and pt was given TNK and BP control initiated.  MRI showed no acute abnormality.  MRA unremarkable for LVO.  TEE unremarkable except for possible small PFO and intrapulmonary shunt.  TCD bubble  study pending.  Neurology recommended asa 81 mg daily and plavix 75 mg daily for DAPT x3 weeks, then asa alone.  Therapy ongoing and pt was recommended for CIR.   Complete NIHSS TOTAL: 4 Glasgow Coma Scale Score: 15  [facility] record from [facility] has been reviewed by the rehabilitation admission coordinator and physician.  Past Medical History  Past Medical History:  Diagnosis Date   Allergy    Anemia    Anxiety    Arthritis    arthritis knees   Blood transfusion without reported diagnosis    GERD (gastroesophageal reflux disease)    Hypothyroidism    Neuromuscular disorder (HCC)    Obesity (BMI 30-39.9) 01/05/2019    Has the patient had major surgery during 100 days prior to admission? No  Family History  family history includes Breast cancer in her maternal aunt and maternal grandmother; Colon cancer in her maternal uncle; Colon cancer (age of onset: 21) in her maternal uncle; Colon polyps in her mother; Crohn's disease in her mother; Diabetes in her maternal grandmother; Heart attack in  her paternal grandfather; Heart failure in her maternal grandmother and paternal grandfather; Hyperlipidemia in her father; Hypertension in her brother, father, and mother; Stroke in her father; Thyroid disease in her paternal grandmother; Vitamin D deficiency in her paternal grandmother.   Current Medications   Current Facility-Administered Medications:    acetaminophen (TYLENOL) tablet 650 mg, 650 mg, Oral, Q4H PRN, 650 mg at 10/08/22 0828 **OR** acetaminophen (TYLENOL) 160 MG/5ML solution 650 mg, 650 mg, Per Tube, Q4H PRN **OR** acetaminophen (TYLENOL) suppository 650 mg, 650 mg, Rectal, Q4H PRN, Donnetta Simpers, MD   aspirin chewable tablet 81 mg, 81 mg, Oral, Daily, de La Torre, Circle E, NP, 81 mg at 10/08/22 [id number]   atorvastatin (LIPITOR) tablet 20 mg, 20 mg, Oral, Daily, Rosalin Hawking, MD, 20 mg at 10/08/22 [id number]   buPROPion (WELLBUTRIN XL) 24 hr tablet 300 mg, 300 mg, Oral,  Daily, Beulah Gandy A, NP, 300 mg at 10/08/22 [id number]   Chlorhexidine Gluconate Cloth 2 % PADS 6 each, 6 each, Topical, Daily, Donnetta Simpers, MD, 6 each at 10/08/22 0830   clopidogrel (PLAVIX) tablet 75 mg, 75 mg, Oral, Daily, de Yolanda Manges, Idanha E, NP, 75 mg at 10/08/22 0828   enoxaparin (LOVENOX) injection 40 mg, 40 mg, Subcutaneous, Q24H, Rosalin Hawking, MD, 40 mg at 10/07/22 1745   Oral care mouth rinse, 15 mL, Mouth Rinse, PRN, Donnetta Simpers, MD   pantoprazole (PROTONIX) EC tablet 40 mg, 40 mg, Oral, QHS, Donnamae Jude, RPH, 40 mg at 10/07/22 2100   potassium chloride SA (KLOR-CON M) CR tablet 20 mEq, 20 mEq, Oral, BID, Thressa Sheller, Erin C, NP, 20 mEq at 10/08/22 1021   senna-docusate (Senokot-S) tablet 1 tablet, 1 tablet, Oral, QHS PRN, Donnetta Simpers, MD   thyroid (ARMOUR) tablet 180 mg, 180 mg, Oral, Daily, Beulah Gandy A, NP, 180 mg at 10/08/22 [id number]   topiramate (TOPAMAX) tablet 75 mg, 75 mg, Oral, QHS, Donnetta Simpers, MD, 75 mg at 10/07/22 2100  Patients Current Diet:  Diet Order             Diet regular Room service appropriate? 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  Independent  Indoor Mobility: Did the patient need assistance with walking from room to room (with or without device)? Independent  Stairs: Did the patient need assistance with internal or external stairs (with or  without device)? Independent  Functional Cognition: Did the patient need help planning regular tasks such as shopping or remembering to take medications? Independent  Patient Information Are you of Hispanic, Latino/a,or Spanish origin?: A. No, not of Hispanic, Latino/a, or Spanish origin What is your race?: B. Black or African American Do you need or want an interpreter to communicate with a doctor or health care staff?: 0. No  Patient's Response To:  Health Literacy and Transportation Is the patient able to respond to health literacy and transportation needs?: Yes Health  Literacy - How often do you need to have someone help you when you read instructions, pamphlets, or other written material from your doctor or pharmacy?: Never In the past 12 months, has lack of transportation kept you from medical appointments or from getting medications?: No In the past 12 months, has lack of transportation kept you from meetings, work, or from getting things needed for daily living?: No  Development worker, international aid / [place] Devices/Equipment: None Home Equipment: None  Prior Device Use: Indicate devices/aids used by the patient prior to current illness, exacerbation or injury? None of the above  Current Functional Level Cognition  Arousal/Alertness: Awake/alert Overall Cognitive Status: Within Functional Limits for tasks assessed Orientation Level: Oriented X4 General Comments: deliberate speech, able to answer all questions appropriately Attention: Focused, Sustained Focused Attention: Appears intact Sustained Attention: Appears intact Memory: Impaired Memory Impairment: Decreased recall of new information, Decreased short term memory Awareness: Appears intact Executive Function: Organizing, Self Monitoring Organizing: Impaired    Extremity Assessment (includes Sensation/Coordination)  Upper Extremity Assessment: RUE deficits/detail RUE Deficits / Details: strength grossly  4/5, decreased fine motor coordination  Lower Extremity Assessment: RLE deficits/detail RLE Deficits / Details: AAROM WFL (supine, somewhat limited by reports of new knee pain); knee extension 3/5, ankle DF 5    ADLs  Overall ADL's : Needs assistance/impaired Eating/Feeding: Supervision/ safety, Sitting Eating/Feeding Details (indicate cue type and reason): Pt able to set up lunch tray without physical assist Grooming: Oral care, Supervision/safety, Standing, Wash/dry hands Grooming Details (indicate cue type and reason): Pt able to complete  entire task of brushing teeth including opening plastic wrap to retrieve toothbrush and applying toothpaste to toothbrush. Upper Body Bathing: Min guard, Sitting Lower Body Bathing: Minimal assistance, Sitting/lateral leans Upper Body Dressing : Minimal assistance Upper Body Dressing Details (indicate cue type and reason): doff extra gown like robe Lower Body Dressing: Moderate assistance, Sitting/lateral leans Lower Body Dressing Details (indicate cue type and reason): for socks Toilet Transfer: Min guard, Ambulation, Regular Toilet Toilet Transfer Details (indicate cue type and reason): 1 person HHA Toileting- Clothing Manipulation and Hygiene: Modified independent, Sitting/lateral lean Functional mobility during ADLs: Minimal assistance (1 person HHA) General ADL Comments: R sided weakness, vision impacted    Mobility  Overal bed mobility:  (up in recliner upon therapy arrival) Bed Mobility: Supine to Sit, Sit to Supine Supine to sit: Min guard, HOB elevated Sit to supine: Min assist (for RLE) General bed mobility comments: up in recliner upon therapy arrival    Transfers  Overall transfer level: Needs assistance Equipment used: None, Rolling walker (2 wheels) Transfers: Sit to/from Stand Sit to Stand: Min assist Bed to/from chair/wheelchair/BSC transfer type:: Step pivot Step pivot transfers: Min guard General transfer comment: MinA to  initially steady    Ambulation / Gait / Stairs / Emergency planning/management officer  Ambulation/Gait Ambulation/Gait assistance: Herbalist (Feet): 105 Feet Assistive device: Rolling walker (2 wheels) Gait Pattern/deviations: Step-through pattern, Decreased stride length, Decreased weight shift to right, Decreased stance time - right General Gait Details: Pt requiring minimal assist, particularly with turns, for balance. Improved R heel strike at initial contact. Cues for equal step lengths, tactile facilitation for R posterior pelvic rotation and L scapula protraction to promote midline. Gait velocity: decreased Gait velocity interpretation: <1.8 ft/sec, indicate of risk for recurrent falls    Posture / Balance Balance Overall balance assessment: Needs assistance Sitting-balance support: No upper extremity supported, Feet supported Sitting balance-Leahy Scale: Good Standing balance support: During  functional activity, No upper extremity supported Standing balance-Leahy Scale: Fair    Special needs/care consideration N/a     Previous Home Environment (from acute therapy documentation) Living Arrangements: Alone  Lives With: Alone Available Help at Discharge: Family, Friend(s), Available PRN/intermittently (?Electronics engineer (son) come home from school if 24/7 needed) Type of Home: House (townhouse) Home Layout: Two level, Bed/bath upstairs Alternate Level Stairs-Number of Steps: flight Home Access: Stairs to enter Technical brewer of Steps: 1 Bathroom Shower/Tub: Optometrist: Yes How Accessible: Accessible via walker Brilliant: No Additional Comments: works as a Technical brewer for [employer]  Discharge Living Setting Plans for Discharge Living Setting: Patient's home (friends to come stay with her) Type of Home at Discharge:  (townhome) [place]: Two level, 1/2 bath on main level, Able to live on main level  with bedroom/bathroom (friend will move bed downstairs) Alternate Level Stairs-Rails: Left Alternate Level Stairs-Number of Steps: full flight Discharge Home Access: Stairs to enter Entrance Stairs-Rails: None Entrance Stairs-Number of Steps: 1 Discharge Bathroom Shower/Tub: Tub/shower unit Discharge Bathroom Toilet: Standard Discharge Bathroom Accessibility: Yes How Accessible: Accessible via walker Does the patient have any problems obtaining your medications?: No  Social/Family/Support Systems Patient Roles: Parent Contact Information: Dahlia Bailiff (college student in [place]) Anticipated Caregiver: Kennyth Lose and Verdene Lennert (friends) Anticipated Caregiver's Contact Information: Kennyth Lose [phone] Ability/Limitations of Caregiver: can provide intermittent supervision, but not full 24/7 Caregiver Availability: Other (Comment) (Verified support Sun/M/T/R/F/Sa, pt still working to verify W support) Discharge Plan Discussed with Primary Caregiver: Yes Is Caregiver In Agreement with Plan?: Yes Does Caregiver/Family have Issues with Lodging/Transportation while Pt is in Rehab?: No   Goals Patient/Family Goal for Rehab: PT/OT supervision to mod I, SLP mod I Expected length of stay: 11-15 days Additional Information: Discharge plan: to patient's home, friend is moving bed to first floor for easier set up.  Pt's cousin, Kennyth Lose, is heading up the task of arranging as close to 24/7 as possible for pt.  With sup-mod I goals should be okay to be intermittent but they are aiming for 24/7.  It will be multiple family members stepping in.  Kennyth Lose is the contact person.  Pt/Family Agrees to Admission and willing to participate: Yes Program Orientation Provided & Reviewed with Pt/Caregiver Including Roles  & Responsibilities: Yes  Barriers to Discharge: Insurance for SNF coverage, Home environment access/layout   Decrease burden of Care through IP rehab admission: n/a   Possible need for SNF  placement upon discharge: No.  Plan for d/c home to pt's town home, with friends providing near 24/7 assist.     Patient Condition: This patient's condition remains as documented in the consult dated 2/19, in which the Rehabilitation Physician determined and documented that the patient's condition is appropriate for intensive rehabilitative care in an inpatient rehabilitation facility. Will admit to inpatient rehab today.  Preadmission Screen Completed By:  Michel Santee, PT, DPT 10/08/2022 11:06 AM ______________________________________________________________________   Discussed status with Dr. Curlene Dolphin on 10/08/22 at 3:15 PM  and received approval for admission today.  Admission Coordinator:  Michel Santee, PT, DPT time3:15 PM /Date02/21/24

## 2022-10-08 NOTE — H&P (Shared)
Physical Medicine and Rehabilitation Admission H&P    Chief Complaint  Patient presents with   Functional deficits   Stroke    HPI:  Donna Mccarthy is a 47 year old RH-female with history of migraines, anxiety, Vitamin D deficiency, Covid 3 weeks PTA; who was admitted on 10/03/22 with left facial droop, blurred vision and slurred speech that started while she was driving. She was found to have severe dysarthria with left facial droop that was variable and severely and b/l CN VI incomplete palsy.   CTA without LVO and  and she received TNK.  MRI/MRA brain done and negative for stroke, stenosis or acute process. 2D echo showed EF 60-65% with no wall abnormalities. She had dysconjugate gaze with diplopia that Dr. Erlinda Hong felt was more consistent with possible brainstem infarct secondary to small vessel disease and not seen on MRI. She was noted to have right sided weakness ith sensory deficits. Neurology recommended DAPT X 3 weeks followed by ASA alone.    Cardiology consulted and TEE done 02/20 revealing EF 60-65% with no wall abnormality but showed possible intrapulmonary shunt and small PFO --TCD recommended for further evaluation. TCD done today and negative for right to left shunt.  PT/OT has been working with patient who continues to be limited by double vision, right sided weakness with decreased in motor control and right sided  sensory deficits affecting ability to carry out ADLs and mobility. CIR recommended due to functional decline.   Patient reports some continued mild esophageal discomfort after TEE.  She reports mild headache earlier today however this has improved.  Review of Systems  Constitutional:  Negative for chills and fever.  HENT:  Negative for hearing loss and tinnitus.   Eyes:  Positive for double vision.  Respiratory:  Positive for cough.   Cardiovascular:  Negative for chest pain.  Gastrointestinal:  Positive for constipation (uses colace twice a week) and  heartburn. Negative for abdominal pain.  Genitourinary:  Negative for dysuria and urgency.  Musculoskeletal:  Negative for back pain and neck pain.  Neurological:  Positive for focal weakness. Negative for dizziness and headaches.  Psychiatric/Behavioral:  The patient is not nervous/anxious and does not have insomnia.     Past Medical History:  Diagnosis Date   Allergy    Anemia    Anxiety    Arthritis    arthritis knees   Blood transfusion without reported diagnosis    GERD (gastroesophageal reflux disease)    Hypothyroidism    Neuromuscular disorder (HCC)    Obesity (BMI 30-39.9) 01/05/2019    Past Surgical History:  Procedure Laterality Date   BIOPSY  04/03/2020   Procedure: BIOPSY;  Surgeon: Harvel Quale, MD;  [facility];  Service: Gastroenterology;;  antral, body, distal esophagus, mid esophagus   BRACHIOPLASTY Bilateral 07/02/2020   Procedure: BILATERAL BRACHIOPLASTY WITH LIPOSUCTION;  Surgeon: [name], MD;  Location: [place];  Service: Plastics;  Laterality: Bilateral;   DILATION AND CURETTAGE OF UTERUS     ESOPHAGOGASTRODUODENOSCOPY (EGD) WITH PROPOFOL N/A 04/03/2020   Procedure: ESOPHAGOGASTRODUODENOSCOPY (EGD) WITH PROPOFOL;  Surgeon: Harvel Quale, MD;  [facility];  Service: Gastroenterology;  Laterality: N/A;  11:15   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   LIPOSUCTION Bilateral 07/02/2020   Procedure: LIPOSUCTION;  Surgeon: [name], MD;  Location: [place];  Service: Plastics;  Laterality: Bilateral;   PANNICULECTOMY N/A 08/04/2019   Procedure: PANNICULECTOMY;  Surgeon: [name], MD;  Location: [place];  Service: Plastics;  Laterality: N/A;  2 hours only, please    Family History  Problem Relation Age of Onset   Hypertension Mother    Crohn's disease Mother    Colon polyps Mother    Hypertension Father        POTS   Stroke  Father        TIA   Hyperlipidemia Father    Hypertension Brother    Breast cancer Maternal Grandmother    Diabetes Maternal Grandmother    Heart failure Maternal Grandmother    Thyroid disease Paternal Grandmother    Vitamin D deficiency Paternal Grandmother    Heart failure Paternal Grandfather    Heart attack Paternal Grandfather    Colon cancer Maternal Uncle        dx'd in 70's   Colon cancer Maternal Uncle 58   Breast cancer Maternal Aunt     Social History:   Lives alone. 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 Has 29 year old son who lives in [place]. She  reports that she has never smoked. She has never used smokeless tobacco. She reports that she does not drink alcohol and does not use drugs.   Allergies  Allergen Reactions   Ketorolac Tromethamine Other (See Comments)    "felt like whole body was on fire"   Penicillins Other (See Comments)    Mother said she had a rash   Celebrex [Celecoxib] Itching    itching    Medications Prior to Admission  Medication Sig Dispense Refill   aspirin-acetaminophen-caffeine (EXCEDRIN MIGRAINE) 250-250-65 MG tablet Take 1 tablet by mouth as needed for headache.     buPROPion (WELLBUTRIN XL) 300 MG 24 hr tablet Take 1 tablet (300 mg total) by mouth daily. 90 tablet 0   fluticasone (FLONASE) 50 MCG/ACT nasal spray Place 2 sprays into both nostrils as needed for allergies.     levonorgestrel (MIRENA) 20 MCG/24HR IUD 1 each by Intrauterine route once.     loratadine (CLARITIN) 10 MG tablet Take 10 mg by mouth daily as needed for allergies.     Magnesium 250 MG TABS Take 250 mg by mouth daily.     melatonin 3 MG TABS tablet Take 6 mg by mouth at bedtime.     norethindrone (AYGESTIN) 5 MG tablet Take 2 tablets by mouth once daily (Patient taking differently: Take 10 mg by mouth as needed (heavy bleeding).) 30 tablet 1   SUMAtriptan (IMITREX) 100 MG tablet Take 1 tablet (100 mg total) by mouth once for 1 dose. May repeat in 2 hours if  headache persists or recurs. (Patient taking differently: Take 100 mg by mouth every 2 (two) hours as needed for headache or migraine.) 10 tablet 1   thyroid (NP THYROID) 120 MG tablet Take 1 tablet (120 mg total) by mouth daily on an empty stomach. 30 tablet 5   thyroid (NP THYROID) 60 MG tablet Take 1 tablet by mouth on an empty stomach once a day 30 tablet 5   topiramate (TOPAMAX) 25 MG tablet Take 3 tablets (75 mg total) by mouth at bedtime. 270 tablet 3   Vitamin D, Ergocalciferol, (DRISDOL) 1.25 MG (50000 UNIT) CAPS capsule Take 1 capsule (50,000 Units total) by mouth every 7 (seven) days. 12 capsule 0   Semaglutide-Weight Management (WEGOVY) 0.25 MG/0.5ML SOAJ Inject 0.25 mg into the skin once a week. (Patient not taking: Reported on 08/25/2022) 2  mL 5      Home: Home Living Family/patient expects to be discharged to:: Private residence Living Arrangements: Alone Available Help at Discharge: Family, Friend(s), Available PRN/intermittently (?Electronics engineer (son) come home from school if 24/7 needed) Type of Home: House (townhouse) Home Access: Stairs to enter Technical brewer of Steps: 1 Home Layout: Two level, Bed/bath upstairs Alternate Level Stairs-Number of Steps: flight Bathroom Shower/Tub: Chiropodist: Standard Bathroom Accessibility: Yes Home Equipment: None Additional Comments: works as a Technical brewer for [employer] With: Alone   Functional History: Prior Function Prior Level of Function : Independent/Modified Independent, Driving, 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[employer])  Functional Status:  Mobility: Bed Mobility Overal bed mobility: Modified Independent Bed Mobility: Supine to Sit, Sit to Supine Supine to sit: Min guard, HOB elevated Sit to supine: Min assist (for RLE) General bed mobility comments: up in recliner at beginning and end of session Transfers Overall transfer level: Needs assistance Equipment used: Rolling walker (2  wheels) Transfers: Sit to/from Stand Sit to Stand: Min guard Bed to/from chair/wheelchair/BSC transfer type:: Step pivot Step pivot transfers: Min guard General transfer comment: Min guard A to initially steady and for safety Ambulation/Gait Ambulation/Gait assistance: Min guard Gait Distance (Feet): 80 Feet Assistive device: Rolling walker (2 wheels) Gait Pattern/deviations: Step-through pattern, Decreased stride length, Decreased weight shift to right, Decreased stance time - right General Gait Details: Min cues for increased L step length, improved R heel strike at initial contact. Mildly increased gait speed. Tactile facilitation for R posterior pelvic rotation and L shoulder internal rotation to achieve midline Gait velocity: decreased Gait velocity interpretation: <1.8 ft/sec, indicate of risk for recurrent falls Stairs: Yes Stairs assistance: Min guard Stair Management: Two rails Number of Stairs: 23    ADL: ADL Overall ADL's : Needs assistance/impaired Eating/Feeding: Modified independent, Sitting Eating/Feeding Details (indicate cue type and reason): finishing up lunch, reports that it was ok self-feeding, slow and steady Grooming: Oral care, Supervision/safety, Standing, Wash/dry hands Grooming Details (indicate cue type and reason): Pt able to complete  entire task of brushing teeth including retrieving various grooming items from around the sink and applying toothpaste to toothbrush. Upper Body Bathing: Min guard, Sitting Lower Body Bathing: Minimal assistance, Sitting/lateral leans Upper Body Dressing : Minimal assistance Upper Body Dressing Details (indicate cue type and reason): doff extra gown like robe Lower Body Dressing: Moderate assistance, Sitting/lateral leans Lower Body Dressing Details (indicate cue type and reason): for socks Toilet Transfer: Min guard, Minimal assistance, Ambulation, Rolling walker (2 wheels) Toilet Transfer Details (indicate cue type and  reason): to bathroom Toileting- Clothing Manipulation and Hygiene: Modified independent, Sitting/lateral lean Functional mobility during ADLs: Minimal assistance (1 person HHA) General ADL Comments: R sided weakness, vision impacted  Cognition: Cognition Overall Cognitive Status: Within Functional Limits for tasks assessed Arousal/Alertness: Awake/alert Orientation Level: Oriented X4 Year: 2024 Month: February Day of Week: Correct Attention: Focused, Sustained Focused Attention: Appears intact Sustained Attention: Appears intact Memory: Impaired Memory Impairment: Decreased recall of new information, Decreased short term memory Awareness: Appears intact Executive Function: Organizing, Self Monitoring Organizing: Impaired Cognition Arousal/Alertness: Awake/alert Behavior During Therapy: WFL for tasks assessed/performed Overall Cognitive Status: Within Functional Limits for tasks assessed General Comments: deliberate speech, able to answer all questions appropriately   Blood pressure (!) 96/57, pulse 86, temperature (!) 97.3 F (36.3 C), temperature source Oral, resp. rate 16, height 5' 5"$  (1.651 m), weight 82.3 kg, last menstrual period 09/22/2022, SpO2 100 %.   General: Alert and  oriented x 3, No apparent distress HEENT: Head is normocephalic, atraumatic, PERRLA, sclera anicteric, oral mucosa pink and moist, dentition intact, ext ear canals clear,  Neck: Supple without JVD or lymphadenopathy Heart: Reg rate and rhythm. No murmurs rubs or gallops Chest: CTA bilaterally without wheezes, rales, or rhonchi; no distress Abdomen: Soft, non-tender, non-distended, bowel sounds positive. Extremities: No clubbing, cyanosis, or edema. Pulses are 2+ Psych: Pt's affect is appropriate. Pt is cooperative Skin: Clean and intact without signs of breakdown Neuro: Alert and oriented x 4, slightly delayed/hesitant speech today.  No significant aphasia or dysarthria noted.  Follows commands.   Right lens taped on glasses.  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  Slight left facial droop.  Tongue midline.  Altered sensation over right face.  Able to name 3 objects and repeat. Strength 5 out of 5 left upper extremity and left lower extremity Strength 4 out of 5 right upper extremity and right lower extremity Sensation intact in all 4 extremities however altered over right arm and leg and face Finger-nose slightly slowed right greater than left No ankle clonus bilaterally Musculoskeletal: No abnormal tone, no range of motion deficits noted, no joint swelling or tenderness  Results for orders placed or performed during the hospital encounter of 10/03/22 (from the past 48 hour(s))  CBC     Status: Abnormal   Collection Time: 10/07/22  3:36 AM  Result Value Ref Range   WBC 3.7 (L) 4.0 - 10.5 K/uL   RBC 4.24 3.87 - 5.11 MIL/uL   Hemoglobin 11.1 (L) 12.0 - 15.0 g/dL   HCT 35.3 (L) 36.0 - 46.0 %   MCV 83.3 80.0 - 100.0 fL   MCH 26.2 26.0 - 34.0 pg   MCHC 31.4 30.0 - 36.0 g/dL   RDW 13.9 11.5 - 15.5 %   Platelets 166 150 - 400 K/uL   nRBC 0.0 0.0 - 0.2 %    Comment: Performed at [facility], [place] [address], [place], [place] [id number]  Basic metabolic panel     Status: Abnormal   Collection Time: 10/07/22  3:36 AM  Result Value Ref Range   Sodium 139 135 - 145 mmol/L   Potassium 3.7 3.5 - 5.1 mmol/L   Chloride 109 98 - 111 mmol/L   CO2 21 (L) 22 - 32 mmol/L   Glucose, Bld 95 70 - 99 mg/dL    Comment: Glucose reference range applies only to samples taken after fasting for at least 8 hours.   BUN 7 6 - 20 mg/dL   Creatinine, Ser 0.53 0.44 - 1.00 mg/dL   Calcium 9.0 8.9 - 10.3 mg/dL   GFR, Estimated >60 >60 mL/min    Comment: (NOTE) Calculated using the CKD-EPI Creatinine Equation (2021)    Anion gap 9 5 - 15    Comment: Performed at [facility] [address] [place], [place] [address]  CBC     Status:  Abnormal   Collection Time: 10/08/22  8:17 AM  Result Value Ref Range   WBC 3.8 (L) 4.0 - 10.5 K/uL   RBC 4.38 3.87 - 5.11 MIL/uL   Hemoglobin 11.4 (L) 12.0 - 15.0 g/dL   HCT 36.6 36.0 - 46.0 %   MCV 83.6 80.0 - 100.0 fL   MCH 26.0 26.0 - 34.0 pg   MCHC 31.1 30.0 - 36.0 g/dL   RDW 13.9 11.5 - 15.5 %   Platelets 171 150 - 400  K/uL   nRBC 0.0 0.0 - 0.2 %    Comment: Performed at [facility], [place] [address] [place], [place] [id number]  Basic metabolic panel     Status: Abnormal   Collection Time: 10/08/22  8:17 AM  Result Value Ref Range   Sodium 137 135 - 145 mmol/L   Potassium 3.8 3.5 - 5.1 mmol/L   Chloride 107 98 - 111 mmol/L   CO2 22 22 - 32 mmol/L   Glucose, Bld 123 (H) 70 - 99 mg/dL    Comment: Glucose reference range applies only to samples taken after fasting for at least 8 hours.   BUN 7 6 - 20 mg/dL   Creatinine, Ser 0.66 0.44 - 1.00 mg/dL   Calcium 9.1 8.9 - 10.3 mg/dL   GFR, Estimated >60 >60 mL/min    Comment: (NOTE) Calculated using the CKD-EPI Creatinine Equation (2021)    Anion gap 8 5 - 15    Comment: Performed at [facility] [address], [place], [place] [address]  Vitamin B12     Status: None   Collection Time: 10/08/22  8:17 AM  Result Value Ref Range   Vitamin B-12 509 180 - 914 pg/mL    Comment: (NOTE) This assay is not validated for testing neonatal or myeloproliferative syndrome specimens for Vitamin B12 levels. Performed at [facility], [place] [address], [place], [place] [address]   TSH     Status: Abnormal   Collection Time: 10/08/22  8:17 AM  Result Value Ref Range   TSH 0.015 (L) 0.350 - 4.500 uIU/mL    Comment: Performed by a 3rd Generation assay with a functional sensitivity of <=0.01 uIU/mL. Performed at [facility], [place] [address], [place], [place] [address]   RPR     Status: None   Collection Time: 10/08/22  8:17 AM  Result Value Ref Range   RPR Ser Ql NON REACTIVE NON REACTIVE    Comment: Performed at  [facility], [place] [address], [place],  [address]   VAS [place] TRANSCRANIAL DOPPLER W BUBBLES  Result Date: 10/08/2022  Transcranial Doppler with Bubble Patient Name:  TESSALYN DEST  Date of Exam:   10/08/2022 Medical Rec #: [id number]           Accession #:    [id number] Date of Birth: May 31, 1976            Patient Gender: F Patient Age:   38 years Exam Location:  [facility] Procedure:      VAS [place] TRANSCRANIAL DOPPLER W BUBBLES Referring Phys: Cornelius Moras XU --------------------------------------------------------------------------------  Indications: Stroke. Comparison Study: Echo TEE on 10-07-2022 Performing Technologist: Darlin Coco RDMS, RVT  Examination Guidelines: A complete evaluation includes B-mode imaging, spectral Doppler, color Doppler, and power Doppler as needed of all accessible portions of each vessel. Bilateral testing is considered an integral part of a complete examination. Limited examinations for reoccurring indications may be performed as noted.  Summary:  A vascular evaluation was performed. The left middle cerebral artery was studied. An IV was inserted into the patient's right wrist. Verbal informed consent was obtained.  No HITS at rest or during Valsalva. Negative transcranial Doppler Bubble study with no evidence of right to left intracardiac or intrapulmonary communication.  *See table(s) above for TCD measurements and observations.  Diagnosing physician: Rosalin Hawking MD Electronically signed by Rosalin Hawking MD on 10/08/2022 at 2:44:22 PM.    Final    ECHO  TEE  Result Date: 10/07/2022    TRANSESOPHOGEAL ECHO REPORT   Patient Name:   FARNAZ DERMODY Date of Exam: 10/07/2022 Medical Rec #:  [id number]          Height:       65.0 in Accession #:    [id number]         Weight:       181.4 lb Date of Birth:  06/09/1976           BSA:          1.898 m Patient Age:    11 years           BP:           100/83 mmHg Patient Gender: F                  HR:           94 bpm. Exam  Location:  Inpatient Procedure: Transesophageal Echo, Color Doppler, Cardiac Doppler and Saline            Contrast Bubble Study Indications:     stroke  History:         Patient has prior history of Echocardiogram examinations, most                  recent 10/04/2022.  Sonographer:     Johny Chess RDCS Referring Phys:  [id number] Tami Lin DUKE Diagnosing Phys: Gwyndolyn Kaufman MD PROCEDURE: After discussion of the risks and benefits of a TEE, an informed consent was obtained from the patient. The transesophogeal probe was passed without difficulty through the esophogus of the patient. Imaged were obtained with the patient in a left lateral decubitus position. Sedation performed by different physician. The patient was monitored while under deep sedation. Anesthestetic sedation was provided intravenously by Anesthesiology: 758m of Propofol. The patient developed no complications during the procedure.  IMPRESSIONS  1. Left ventricular ejection fraction, by estimation, is 60 to 65%. The left ventricle has normal function.  2. Right ventricular systolic function is normal. The right ventricular size is normal.  3. No left atrial/left atrial appendage thrombus was detected.  4. The mitral valve is normal in structure. Trivial mitral valve regurgitation.  5. The aortic valve is tricuspid. Aortic valve regurgitation is not visualized. No aortic stenosis is present.  6. The atrial septum is redundant. No interatrial shunting detected by color doppler. Multiple agitated saline contrast bubble studies performed. The majority of bubbles were seen late suggestive of intrapulmonary shunting. There were a small number of bubbles seen early during the study suggestive of a small PFO. Recommend transcranial doppler for further evaluation. FINDINGS  Left Ventricle: Left ventricular ejection fraction, by estimation, is 60 to 65%. The left ventricle has normal function. The left ventricular internal cavity size was normal in  size. Right Ventricle: The right ventricular size is normal. No increase in right ventricular wall thickness. Right ventricular systolic function is normal. Left Atrium: Left atrial size was normal in size. No left atrial/left atrial appendage thrombus was detected. Right Atrium: Right atrial size was normal in size. Pericardium: There is no evidence of pericardial effusion. Mitral Valve: The mitral valve is normal in structure. Trivial mitral valve regurgitation. Tricuspid Valve: The tricuspid valve is normal in structure. Tricuspid valve regurgitation is trivial. No evidence of tricuspid stenosis. Aortic Valve: The aortic valve is tricuspid. Aortic valve regurgitation is not visualized. No aortic stenosis is present. Pulmonic Valve: The pulmonic valve was normal  in structure. Pulmonic valve regurgitation is not visualized. Aorta: The aortic root is normal in size and structure. IAS/Shunts: The atrial septum is redundant. No interatrial shunting detected by color doppler. Multiple agitated saline contrast bubble studies performed. The majority of bubbles were seen late suggestive of intrapulmonary shunting. There were a small number of bubbles seen early during the study suggestive of a small PFO. Recommend transcranial doppler for further evaluation. Agitated saline contrast was given intravenously to evaluate for intracardiac shunting. Gwyndolyn Kaufman MD Electronically signed by Gwyndolyn Kaufman MD Signature Date/Time: 10/07/2022/4:25:23 PM    Final       Blood pressure (!) 96/57, pulse 86, temperature (!) 97.3 F (36.3 C), temperature source Oral, resp. rate 16, height 5' 5"$  (1.651 m), weight 82.3 kg, last menstrual period 09/22/2022, SpO2 100 %.  Medical Problem List and Plan: 1. 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  Antithrombotics: -DVT/anticoagulation:   Pharmaceutical: Lovenox  -antiplatelet therapy: DAPT for 3 weeks followed by aspirin alone 3. Pain Management: Fioricet prn for HA 4. Mood/Behavior/Sleep: LCSW to follow for evaluation and support.   --trazodone prn for chronic insomnia (melatonin not very effective)  -antipsychotic agents: N/A 5. Neuropsych/cognition: This patient is capable of making decisions on her own behalf. 6. Skin/Wound Care:  Routine pressure relief measures 7. Fluids/Electrolytes/Nutrition:  Monitor I/O. Check CMET in am 8. Hypotension: SBP in 90's but asmptomatic?,  Monitor for orthostatic symptoms --Encourage fluid intake 9. Migraines: Has been managed with use of Topamax and Imitrex twice a month on average.  10.Transient hypokalemia: Resolved w/ supplement.  Last K+ 3.8 on 2/21 recheck in am.  11. Low vitamin D level: Ergocalciferol-21.7 on 08/25/22  --resume supplement.   12. Neutropenia: WBC 3.8 on 2/21 recheck WBC in am.  13. Intermittent constipation: No issues so far. Used colace 2x /wk prn at home.  14.  Hyperlipidemia.  Continue Lipitor  I have personally performed a face to face diagnostic evaluation of this patient and formulated the key components of the plan.  Additionally, I have personally reviewed laboratory data, imaging studies, as well as relevant notes and concur with the physician assistant's documentation above.  The patient's status has not changed from the original H&P.  Any changes in documentation from the acute care chart have been noted above.  Jennye Boroughs, MD, [facility] Drown   Bary Leriche, PA-C 10/08/2022

## 2022-10-08 NOTE — Progress Notes (Signed)
SLP Cancellation Note  Patient Details Name: Donna Mccarthy MRN: [id number] DOB: [birth year]   Cancelled treatment:       Reason Eval/Treat Not Completed: Patient at procedure or test/unavailable (Pt currently ambulating in hallway with PT. SLP will follow up later as schedule allows.)  Lindalee Huizinga I. Hardin Negus, [place], [place] Office number [phone]  Horton Marshall 10/08/2022, 10:57 AM

## 2022-10-08 NOTE — Progress Notes (Signed)
Mobility Specialist Progress Note    10/08/22 1750  Mobility  Activity Ambulated with assistance in hallway  Level of Assistance Minimal assist, patient does 75% or more  Assistive Device Front wheel walker  Distance Ambulated (ft) 120 ft  Activity Response Tolerated well  Mobility Referral Yes  $Mobility charge 1 Mobility   Pt received in bed and agreeable. No complaints on walk. Returned to sitting EOB with call bell in reach.   Hildred Alamin Mobility Specialist  Please Psychologist, sport and exercise or Rehab Office at [phone]

## 2022-10-08 NOTE — Progress Notes (Signed)
Occupational Therapy Treatment Patient Details Name: Donna Mccarthy MRN: [id number] DOB: 17-Oct-1975 Today's Date: 10/08/2022   History of present illness 47 y.o. female who was driving [id number] and had sudden onset L facial droop, dysarthria, blurred vision. Drove herself to ED. given tnkase; NIHSS=5; CT and MRI negative. Neurology suspects ?brainstem CVA vs functional neurologic deficit.   PMH significant for GERD, hypothyroidism, obesity, recent covid   OT comments  Pt progressing towards OT goals this session. Able to demonstrate vision exercises and reports much improvement - although now reporting "floating" in peripheral vision. Pt min guard assist with RW for transfers and min guard A for sink level grooming. AIR remains essential to maximize safety and independence in ADL and functional transfers. OT will continue to follow acutely.    Recommendations for follow up therapy are one component of a multi-disciplinary discharge planning process, led by the attending physician.  Recommendations may be updated based on patient status, additional functional criteria and insurance authorization.    Follow Up Recommendations  Acute inpatient rehab (3hours/day)     Assistance Recommended at Discharge Intermittent Supervision/Assistance  Patient can return home with the following  A little help with walking and/or transfers;A little help with bathing/dressing/bathroom;Assistance with cooking/housework;Assist for transportation;Help with stairs or ramp for entrance   Equipment Recommendations  Other (comment) (TBA)    Recommendations for Other Services PT consult;Rehab consult;Speech consult    Precautions / Restrictions Precautions Precautions: Fall Precaution Comments: double vision Restrictions Weight Bearing Restrictions: No       Mobility Bed Mobility               General bed mobility comments: up in recliner at beginning and end of session    Transfers Overall  transfer level: Needs assistance Equipment used: Rolling walker (2 wheels) Transfers: Sit to/from Stand Sit to Stand: Min assist, Min guard           General transfer comment: Min guard A to initially steady and for safety     Balance Overall balance assessment: Needs assistance Sitting-balance support: No upper extremity supported, Feet supported Sitting balance-Leahy Scale: Good     Standing balance support: During functional activity, No upper extremity supported Standing balance-Leahy Scale: Fair                             ADL either performed or assessed with clinical judgement   ADL Overall ADL's : Needs assistance/impaired Eating/Feeding: Modified independent;Sitting Eating/Feeding Details (indicate cue type and reason): finishing up lunch, reports that it was ok self-feeding, slow and steady Grooming: Oral care;Supervision/safety;Standing;Wash/dry hands Grooming Details (indicate cue type and reason): Pt able to complete  entire task of brushing teeth including retrieving various grooming items from around the sink and applying toothpaste to toothbrush.                 Toilet Transfer: Min guard;Minimal assistance;Ambulation;Rolling walker (2 wheels) Toilet Transfer Details (indicate cue type and reason): to bathroom Toileting- Clothing Manipulation and Hygiene: Modified independent;Sitting/lateral lean              Extremity/Trunk Assessment Upper Extremity Assessment Upper Extremity Assessment: RUE deficits/detail RUE Deficits / Details: strength and coordination improving since evaluation, still demonstrating deficits in fine motor coordination and strength - but improving            Vision   Additional Comments: Pt relays that she has been practicing exercises on double vision handout. When OT  entered the room Pt was finishing up lunch and had glasses off, able to demonstrate.   Perception     Praxis      Cognition  Arousal/Alertness: Awake/alert Behavior During Therapy: WFL for tasks assessed/performed Overall Cognitive Status: Within Functional Limits for tasks assessed                                          Exercises      Shoulder Instructions       General Comments      Pertinent Vitals/ Pain       Pain Assessment Pain Assessment: Faces Faces Pain Scale: No hurt Pain Intervention(s): Monitored during session, Repositioned  Home Living                                          Prior Functioning/Environment              Frequency  Min 2X/week        Progress Toward Goals  OT Goals(current goals can now be found in the care plan section)  Progress towards OT goals: Progressing toward goals  Acute Rehab OT Goals Patient Stated Goal: get vision back to independent OT Goal Formulation: With patient Time For Goal Achievement: 10/19/22 Potential to Achieve Goals: Good  Plan Discharge plan remains appropriate;Frequency remains appropriate    Co-evaluation                 AM-PAC OT "6 Clicks" Daily Activity     Outcome Measure   Help from another person eating meals?: A Little Help from another person taking care of personal grooming?: A Little Help from another person toileting, which includes using toliet, bedpan, or urinal?: None Help from another person bathing (including washing, rinsing, drying)?: A Little Help from another person to put on and taking off regular upper body clothing?: A Little Help from another person to put on and taking off regular lower body clothing?: A Lot 6 Click Score: 18    End of Session Equipment Utilized During Treatment: Gait belt;Rolling walker (2 wheels)  OT Visit Diagnosis: Unsteadiness on feet (R26.81);Muscle weakness (generalized) (M62.81);Other symptoms and signs involving the nervous system (R29.898);Hemiplegia and hemiparesis Hemiplegia - Right/Left: Right Hemiplegia -  dominant/non-dominant: Dominant Hemiplegia - caused by: Unspecified (suspected brain stem infarct)   Activity Tolerance Patient tolerated treatment well   Patient Left in chair;with call bell/phone within reach;with chair alarm set   Nurse Communication Mobility status        Time: [id number] OT Time Calculation (min): 21 min  Charges: OT General Charges $OT Visit: 1 Visit OT Treatments $Self Care/Home Management : 8-22 mins  Jesse Sans OTR/L Acute Rehabilitation Services Office: [place] 10/08/2022, 1:11 PM

## 2022-10-08 NOTE — Progress Notes (Signed)
Inpatient Rehabilitation Admission Medication Review by a Pharmacist  A complete drug regimen review was completed for this patient to identify any potential clinically significant medication issues.  High Risk Drug Classes Is patient taking? Indication by Medication  Antipsychotic Yes Compazine - N/V  Anticoagulant Yes Lovenox - DVT px  Antibiotic No   Opioid Yes Fiorcet - HA  Antiplatelet Yes ASA/plavix x3 wks (3/8) then ASA alone - CVA  Hypoglycemics/insulin No   Vasoactive Medication No   Chemotherapy No   Other Yes Armour thyroid - hypothyroidism Topamax - migraines Trazodone - sleep Vit D - supplementation Protonix - GERD Wellbutrin - depression Lipitor - HLD     Type of Medication Issue Identified Description of Issue Recommendation(s)  Drug Interaction(s) (clinically significant)     Duplicate Therapy     Allergy     No Medication Administration End Date     Incorrect Dose     Additional Drug Therapy Needed     Significant med changes from prior encounter (inform family/care partners about these prior to discharge).    Other  Flonase, claritin, magnesium Resume if needed at dc    Clinically significant medication issues were identified that warrant physician communication and completion of prescribed/recommended actions by midnight of the next day:  No  Name of provider notified for urgent issues identified:   Provider Method of Notification:     Pharmacist comments:   Time spent performing this drug regimen review (minutes):  [place], PharmD, [place], AAHIVP, CPP Infectious Disease Pharmacist 10/08/2022 6:51 PM

## 2022-10-08 NOTE — TOC Transition Note (Signed)
Transition of Care [facility]) - CM/SW Discharge Note   Patient Details  Name: Donna Mccarthy MRN: [id number] Date of Birth: 1976-01-12  [facility] CM/SW Contact:  Pollie Friar, RN Phone Number: 10/08/2022, 3:45 PM   Clinical Narrative:    Pt is discharging to CIR today. CM signing off.   Final next level of care: IP Rehab Facility Barriers to Discharge: No Barriers Identified   Patient Goals and CMS Choice CMS Medicare.gov Compare Post Acute Care list provided to:: Patient Choice offered to / list presented to : Patient  Discharge Placement                         Discharge Plan and Services Additional resources added to the After Visit Summary for                                       Social Determinants of Health (SDOH) Interventions SDOH Screenings   Food Insecurity: No Food Insecurity (10/03/2022)  Housing: Low Risk  (10/06/2022)  Transportation Needs: No Transportation Needs (10/03/2022)  Utilities: Not At Risk (10/03/2022)  Depression (PHQ2-9): Medium Risk (09/29/2022)  Tobacco Use: Low Risk  (10/07/2022)     Readmission Risk Interventions     No data to display

## 2022-10-08 NOTE — Progress Notes (Signed)
Physical Medicine and Rehabilitation Consult Reason for Consult:CVA Referring Physician: Erlinda Hong     HPI: Donna Mccarthy is a 47 y.o. female with a history of obesity and recent COVID who was driving and developed sudden onset of left-sided facial weakness dysarthria and blurred vision.  She presented to [facility] emergency department on 10/04/2022.  Patient definite gaze abnormalities.  She also presented with severe headaches particularly in the left frontal area.  MRI of the brain was negative for acute findings.  However, patient's neurological deficits have persisted and is felt that she suffered a small brainstem infarct not captured on the MRI.  Patient's right-sided motor and sensory deficits have persisted as well as her visual deficits.  She transferred with min guard assistance today and walked 80 feet minimal assistance with notable trunk instability.  She needs significant cues for use of her rolling walker.     Review of Systems  Constitutional:  Negative for chills and fever.  HENT:  Negative for hearing loss and tinnitus.   Eyes:  Positive for blurred vision and double vision.  Respiratory:  Negative for cough.   Cardiovascular:  Negative for chest pain and palpitations.  Gastrointestinal:  Negative for nausea and vomiting.  Genitourinary:  Negative for dysuria and urgency.  Skin:  Negative for rash.  Neurological:  Positive for tingling, tremors, sensory change, speech change, focal weakness and headaches.  Psychiatric/Behavioral:  Negative for depression and suicidal ideas.         Past Medical History:  Diagnosis Date   Allergy     Anemia     Anxiety     Arthritis      arthritis knees   Blood transfusion without reported diagnosis     GERD (gastroesophageal reflux disease)     Hypothyroidism     Neuromuscular disorder (HCC)     Obesity (BMI 30-39.9) 01/05/2019         Past Surgical History:  Procedure Laterality Date   BIOPSY   04/03/2020     Procedure: BIOPSY;  Surgeon: Harvel Quale, MD;  [facility];  Service: Gastroenterology;;  antral, body, distal esophagus, mid esophagus   BRACHIOPLASTY Bilateral 07/02/2020    Procedure: BILATERAL BRACHIOPLASTY WITH LIPOSUCTION;  Surgeon: [name], MD;  Location: [place];  Service: Plastics;  Laterality: Bilateral;   DILATION AND CURETTAGE OF UTERUS       ESOPHAGOGASTRODUODENOSCOPY (EGD) WITH PROPOFOL N/A 04/03/2020    Procedure: ESOPHAGOGASTRODUODENOSCOPY (EGD) WITH PROPOFOL;  Surgeon: Harvel Quale, MD;  Location[facility];  Service: Gastroenterology;  Laterality: N/A;  11:15   LEEP   08/23/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   LIPOSUCTION Bilateral 07/02/2020    Procedure: LIPOSUCTION;  Surgeon: [name], MD;  Location: [place];  Service: Plastics;  Laterality: Bilateral;   PANNICULECTOMY N/A 08/04/2019    Procedure: PANNICULECTOMY;  Surgeon: [name], MD;  Location: [place];  Service: Plastics;  Laterality: N/A;  2 hours only, please         Family History  Problem Relation Age of Onset   Hypertension Mother     Crohn's disease Mother     Colon polyps Mother     Hypertension Father          POTS   Stroke Father          TIA   Hyperlipidemia Father  Hypertension Brother     Breast cancer Maternal Grandmother     Diabetes Maternal Grandmother     Heart failure Maternal Grandmother     Thyroid disease Paternal Grandmother     Vitamin D deficiency Paternal Grandmother     Heart failure Paternal Grandfather     Heart attack Paternal Grandfather     Colon cancer Maternal Uncle          dx'd in 70's   Colon cancer Maternal Uncle 58   Breast cancer Maternal Aunt      Social History:  reports that she has never smoked. She has never used smokeless tobacco. She reports that she does not drink alcohol and does not use drugs. Allergies:       Allergies  Allergen  Reactions   Ketorolac Tromethamine Other (See Comments)      "felt like whole body was on fire"   Penicillins Other (See Comments)      Mother said she had a rash   Celebrex [Celecoxib] Itching      itching          Medications Prior to Admission  Medication Sig Dispense Refill   aspirin-acetaminophen-caffeine (EXCEDRIN MIGRAINE) 250-250-65 MG tablet Take 1 tablet by mouth as needed for headache.       buPROPion (WELLBUTRIN XL) 300 MG 24 hr tablet Take 1 tablet (300 mg total) by mouth daily. 90 tablet 0   fluticasone (FLONASE) 50 MCG/ACT nasal spray Place 2 sprays into both nostrils as needed for allergies.       levonorgestrel (MIRENA) 20 MCG/24HR IUD 1 each by Intrauterine route once.       loratadine (CLARITIN) 10 MG tablet Take 10 mg by mouth daily as needed for allergies.       Magnesium 250 MG TABS Take 250 mg by mouth daily.       melatonin 3 MG TABS tablet Take 6 mg by mouth at bedtime.       norethindrone (AYGESTIN) 5 MG tablet Take 2 tablets by mouth once daily (Patient taking differently: Take 10 mg by mouth as needed (heavy bleeding).) 30 tablet 1   SUMAtriptan (IMITREX) 100 MG tablet Take 1 tablet (100 mg total) by mouth once for 1 dose. May repeat in 2 hours if headache persists or recurs. (Patient taking differently: Take 100 mg by mouth every 2 (two) hours as needed for headache or migraine.) 10 tablet 1   thyroid (NP THYROID) 120 MG tablet Take 1 tablet (120 mg total) by mouth daily on an empty stomach. 30 tablet 5   thyroid (NP THYROID) 60 MG tablet Take 1 tablet by mouth on an empty stomach once a day 30 tablet 5   topiramate (TOPAMAX) 25 MG tablet Take 3 tablets (75 mg total) by mouth at bedtime. 270 tablet 3   Vitamin D, Ergocalciferol, (DRISDOL) 1.25 MG (50000 UNIT) CAPS capsule Take 1 capsule (50,000 Units total) by mouth every 7 (seven) days. 12 capsule 0   Semaglutide-Weight Management (WEGOVY) 0.25 MG/0.5ML SOAJ Inject 0.25 mg into the skin once a week. (Patient not  taking: Reported on 08/25/2022) 2 mL 5      Home: Home Living Family/patient expects to be discharged to:: Private residence Living Arrangements: Alone Available Help at Discharge: Family, Friend(s), Available PRN/intermittently (?college student (son) come home from school if 24/7 needed) Type of Home: House (townhouse) Home Access: Stairs to enter Technical brewer of Steps: 1 Home Layout: Two level, Bed/bath upstairs Alternate Level Stairs-Number of  Steps: flight Bathroom Shower/Tub: Chiropodist: Standard Bathroom Accessibility: Yes Home Equipment: None Additional Comments: works as a Technical brewer for [employer] With: Alone  Functional History: Prior Function Prior Level of Function : Independent/Modified Independent, Driving, 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[employer]) Functional Status:  Mobility: Bed Mobility Overal bed mobility:  (up in recliner upon therapy arrival) Bed Mobility: Supine to Sit, Sit to Supine Supine to sit: Min guard, HOB elevated Sit to supine: Min assist (for RLE) General bed mobility comments: up in recliner upon therapy arrival Transfers Overall transfer level: Needs assistance Equipment used: None, Rolling walker (2 wheels) Transfers: Sit to/from Stand Sit to Stand: Min guard Bed to/from chair/wheelchair/BSC transfer type:: Step pivot Step pivot transfers: Min guard General transfer comment: cues needed for hand placement with RW; with repetition pt was able to perform correctly Ambulation/Gait Ambulation/Gait assistance: Min assist Gait Distance (Feet): 80 Feet Assistive device: Rolling walker (2 wheels) Gait Pattern/deviations: Step-through pattern, Decreased stride length, Knees buckling, Decreased dorsiflexion - right General Gait Details: Pt with ?ataxia as she fatigued with trunk instability and need for min assist for balance; education/cues for proper use of RW and assist to turn RW; did well with "heel first" cuing  for RLE (increased step length and foot clearance) Gait velocity interpretation: <1.8 ft/sec, indicate of risk for recurrent falls   ADL: ADL Overall ADL's : Needs assistance/impaired Eating/Feeding: Supervision/ safety, Sitting Eating/Feeding Details (indicate cue type and reason): Pt able to set up lunch tray without physical assist Grooming: Oral care, Supervision/safety, Standing, Wash/dry hands Grooming Details (indicate cue type and reason): Pt able to complete  entire task of brushing teeth including opening plastic wrap to retrieve toothbrush and applying toothpaste to toothbrush. Upper Body Bathing: Min guard, Sitting Lower Body Bathing: Minimal assistance, Sitting/lateral leans Upper Body Dressing : Minimal assistance Upper Body Dressing Details (indicate cue type and reason): doff extra gown like robe Lower Body Dressing: Moderate assistance, Sitting/lateral leans Lower Body Dressing Details (indicate cue type and reason): for socks Toilet Transfer: Min guard, Ambulation, Regular Toilet Toilet Transfer Details (indicate cue type and reason): 1 person HHA Toileting- Clothing Manipulation and Hygiene: Modified independent, Sitting/lateral lean Functional mobility during ADLs: Minimal assistance (1 person HHA) General ADL Comments: R sided weakness, vision impacted   Cognition: Cognition Overall Cognitive Status: Within Functional Limits for tasks assessed Arousal/Alertness: Awake/alert Orientation Level: Oriented X4 Year: 2024 Month: February Day of Week: Correct Attention: Focused, Sustained Focused Attention: Appears intact Sustained Attention: Appears intact Memory: Impaired Memory Impairment: Decreased recall of new information, Decreased short term memory Awareness: Appears intact Executive Function: Organizing, Self Monitoring Organizing: Impaired Cognition Arousal/Alertness: Awake/alert Behavior During Therapy: WFL for tasks assessed/performed Overall  Cognitive Status: Within Functional Limits for tasks assessed General Comments: deliberate speech, able to answer all questions appropriately   Blood pressure 106/66, pulse 94, temperature 98 F (36.7 C), temperature source Oral, resp. rate 18, weight 81 kg, last menstrual period 09/22/2022, SpO2 100 %. Physical Exam Constitutional:      General: She is not in acute distress. HENT:     Head: Normocephalic and atraumatic.     Right Ear: External ear normal.     Left Ear: External ear normal.     Nose: Nose normal.  Eyes:     General: No scleral icterus.    Conjunctiva/sclera: Conjunctivae normal.  Cardiovascular:     Rate and Rhythm: Normal rate.  Pulmonary:     Effort: Pulmonary effort is normal.  Abdominal:  Palpations: Abdomen is soft.  Musculoskeletal:        General: Normal range of motion.     Cervical back: Normal range of motion.  Skin:    General: Skin is warm.  Neurological:     Mental Status: She is alert.     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  There appears to be weakness in the left 4th nerve as well.  Mild left facial weakness.  Speech is slightly slurred.  She is wearing tape over the right lens of her eyeglasses.  Right upper extremity strength is grossly 3 out of 5 proximal to distal.  Right lower extremity is 2 to 2+ out of 5 proximal distal.  Left motor function is 4+ to 5 out of 5 proximal to distal upper and lower extremity.  Sensation is decreased to light touch and pain at 1 out of 2 in the right arm and leg today.  Patient seems to demonstrate reasonable insight and awareness.  She is able to follow basic commands  Psychiatric:     Comments: Patient slightly flat but otherwise appropriate        Lab Results Last 24 Hours       Results for orders placed or performed during the hospital encounter of 10/03/22 (from the past 24 hour(s))  CBC     Status: Abnormal    Collection Time: 10/06/22  3:58 AM  Result Value Ref Range    WBC 3.6 (L)  4.0 - 10.5 K/uL    RBC 4.17 3.87 - 5.11 MIL/uL    Hemoglobin 11.0 (L) 12.0 - 15.0 g/dL    HCT 34.8 (L) 36.0 - 46.0 %    MCV 83.5 80.0 - 100.0 fL    MCH 26.4 26.0 - 34.0 pg    MCHC 31.6 30.0 - 36.0 g/dL    RDW 14.1 11.5 - 15.5 %    Platelets 160 150 - 400 K/uL    nRBC 0.0 0.0 - 0.2 %  Basic metabolic panel     Status: Abnormal    Collection Time: 10/06/22  3:58 AM  Result Value Ref Range    Sodium 137 135 - 145 mmol/L    Potassium 3.4 (L) 3.5 - 5.1 mmol/L    Chloride 107 98 - 111 mmol/L    CO2 23 22 - 32 mmol/L    Glucose, Bld 96 70 - 99 mg/dL    BUN 10 6 - 20 mg/dL    Creatinine, Ser 0.62 0.44 - 1.00 mg/dL    Calcium 8.9 8.9 - 10.3 mg/dL    GFR, Estimated >60 >60 mL/min    Anion gap 7 5 - 15       Imaging Results (Last 48 hours)  ECHOCARDIOGRAM COMPLETE   Result Date: 10/05/2022    ECHOCARDIOGRAM REPORT   Patient Name:   Donna Mccarthy Date of Exam: 10/04/2022 Medical Rec #:  [id number]          Height:       65.0 in Accession #:    [id number]         Weight:       178.6 lb Date of Birth:  [birth year]           BSA:          1.885 m Patient Age:    47 years           BP:           135/86 mmHg Patient Gender: F  HR:           72 bpm. Exam Location:  [facility] Procedure: 2D Echo, Cardiac Doppler and Color Doppler Indications:    Stroke I63.9  History:        Patient has prior history of Echocardiogram examinations, most                 recent 11/09/2019. Stroke. GERD.  Sonographer:    Alvino Chapel RCS Referring Phys: [id number] [place]  1. Left ventricular ejection fraction, by estimation, is 60 to 65%. The left ventricle has normal function. The left ventricle has no regional wall motion abnormalities. Left ventricular diastolic parameters were normal.  2. Right ventricular systolic function is normal. The right ventricular size is normal.  3. The mitral valve is normal in structure. Trivial mitral valve regurgitation. No evidence of mitral stenosis.   4. The aortic valve is tricuspid. Aortic valve regurgitation is not visualized. No aortic stenosis is present.  5. The inferior vena cava is dilated in size with >50% respiratory variability, suggesting right atrial pressure of 8 mmHg. FINDINGS  Left Ventricle: Left ventricular ejection fraction, by estimation, is 60 to 65%. The left ventricle has normal function. The left ventricle has no regional wall motion abnormalities. The left ventricular internal cavity size was normal in size. There is  no left ventricular hypertrophy. Left ventricular diastolic parameters were normal. Right Ventricle: The right ventricular size is normal. Right ventricular systolic function is normal. Left Atrium: Left atrial size was normal in size. Right Atrium: Right atrial size was normal in size. Pericardium: There is no evidence of pericardial effusion. Mitral Valve: The mitral valve is normal in structure. Trivial mitral valve regurgitation. No evidence of mitral valve stenosis. MV peak gradient, 96.8 mmHg. The mean mitral valve gradient is 64.0 mmHg. Tricuspid Valve: The tricuspid valve is normal in structure. Tricuspid valve regurgitation is trivial. No evidence of tricuspid stenosis. Aortic Valve: The aortic valve is tricuspid. Aortic valve regurgitation is not visualized. No aortic stenosis is present. Pulmonic Valve: The pulmonic valve was normal in structure. Pulmonic valve regurgitation is not visualized. No evidence of pulmonic stenosis. Aorta: The aortic root is normal in size and structure. Venous: The inferior vena cava is dilated in size with greater than 50% respiratory variability, suggesting right atrial pressure of 8 mmHg. IAS/Shunts: No atrial level shunt detected by color flow Doppler.  LEFT VENTRICLE PLAX 2D LVIDd:         4.70 cm   Diastology LVIDs:         2.80 cm   LV e' medial:    12.20 cm/s LV PW:         0.80 cm   LV E/e' medial:  8.1 LV IVS:        0.90 cm   LV e' lateral:   17.10 cm/s LVOT diam:     2.00  cm   LV E/e' lateral: 5.8 LV SV:         80 LV SV Index:   43 LVOT Area:     3.14 cm  RIGHT VENTRICLE RV S prime:     18.20 cm/s TAPSE (M-mode): 2.5 cm LEFT ATRIUM             Index        RIGHT ATRIUM           Index LA diam:        3.60 cm 1.91 cm/m   RA Area:  13.00 cm LA Vol (A2C):   58.1 ml 30.82 ml/m  RA Volume:   32.90 ml  17.45 ml/m LA Vol (A4C):   45.6 ml 24.19 ml/m LA Biplane Vol: 53.8 ml 28.54 ml/m  AORTIC VALVE LVOT Vmax:   131.00 cm/s LVOT Vmean:  81.300 cm/s LVOT VTI:    0.256 m  AORTA Ao Root diam: 2.90 cm MITRAL VALVE MV Area (PHT): 2.45 cm    SHUNTS MV Area VTI:   0.53 cm    Systemic VTI:  0.26 m MV Peak grad:  96.8 mmHg   Systemic Diam: 2.00 cm MV Mean grad:  64.0 mmHg MV Vmax:       4.92 m/s MV Vmean:      381.0 cm/s MV Decel Time: 310 msec MV E velocity: 99.00 cm/s MV A velocity: 97.10 cm/s MV E/A ratio:  1.02 Kirk Ruths MD Electronically signed by Kirk Ruths MD Signature Date/Time: 10/05/2022/11:17:03 AM    Final     MR BRAIN WO CONTRAST   Result Date: 10/04/2022 CLINICAL DATA:  Stroke-like symptoms EXAM: MRI HEAD WITHOUT CONTRAST MRA HEAD WITHOUT CONTRAST TECHNIQUE: Multiplanar, multi-echo pulse sequences of the brain and surrounding structures were acquired without intravenous contrast. Angiographic images of the Circle of Willis were acquired using MRA technique without intravenous contrast. COMPARISON:  05/18/2007 MRI head, correlation is made with CTA head and neck 10/03/2022 FINDINGS: MRI HEAD FINDINGS Brain: No restricted diffusion to suggest acute or subacute infarct. No acute hemorrhage, mass, mass effect, or midline shift. No hydrocephalus or extra-axial collection. No hemosiderin deposition to suggest remote hemorrhage. Cerebral volume is normal for age. Vascular: Normal arterial flow voids. Please see MRA findings below. Skull and upper cervical spine: Normal marrow signal. Sinuses/Orbits: Clear paranasal sinuses. No acute finding in the orbits. Other: The  mastoids are well aerated. MRA HEAD FINDINGS Anterior circulation: Both internal carotid arteries are patent to the termini, without significant stenosis. A1 segments patent. Normal anterior communicating artery. Anterior cerebral arteries are patent to their distal aspects. No M1 stenosis or occlusion. Normal MCA bifurcations. Distal MCA branches perfused and symmetric. Posterior circulation: Vertebral arteries patent to the vertebrobasilar junction without stenosis. Posterior inferior cerebral arteries patent bilaterally. Basilar patent to its distal aspect. Superior cerebellar arteries patent bilaterally. Patent P1 segments. PCAs perfused to their distal aspects without stenosis. The bilateral posterior communicating arteries are patent. Anatomic variants: None significant IMPRESSION: 1. No acute intracranial process. No evidence of acute or subacute infarct. 2. No intracranial large vessel occlusion or significant stenosis. Previously seen narrowing of the MCAs and PCAs on the CTA is not apparent on this exam and may have been secondary to poor vascular opacification. Electronically Signed   By: Merilyn Baba M.D.   On: 10/04/2022 21:55    MR ANGIO HEAD WO CONTRAST   Result Date: 10/04/2022 CLINICAL DATA:  Stroke-like symptoms EXAM: MRI HEAD WITHOUT CONTRAST MRA HEAD WITHOUT CONTRAST TECHNIQUE: Multiplanar, multi-echo pulse sequences of the brain and surrounding structures were acquired without intravenous contrast. Angiographic images of the Circle of Willis were acquired using MRA technique without intravenous contrast. COMPARISON:  05/18/2007 MRI head, correlation is made with CTA head and neck 10/03/2022 FINDINGS: MRI HEAD FINDINGS Brain: No restricted diffusion to suggest acute or subacute infarct. No acute hemorrhage, mass, mass effect, or midline shift. No hydrocephalus or extra-axial collection. No hemosiderin deposition to suggest remote hemorrhage. Cerebral volume is normal for age. Vascular:  Normal arterial flow voids. Please see MRA findings below. Skull and upper cervical  spine: Normal marrow signal. Sinuses/Orbits: Clear paranasal sinuses. No acute finding in the orbits. Other: The mastoids are well aerated. MRA HEAD FINDINGS Anterior circulation: Both internal carotid arteries are patent to the termini, without significant stenosis. A1 segments patent. Normal anterior communicating artery. Anterior cerebral arteries are patent to their distal aspects. No M1 stenosis or occlusion. Normal MCA bifurcations. Distal MCA branches perfused and symmetric. Posterior circulation: Vertebral arteries patent to the vertebrobasilar junction without stenosis. Posterior inferior cerebral arteries patent bilaterally. Basilar patent to its distal aspect. Superior cerebellar arteries patent bilaterally. Patent P1 segments. PCAs perfused to their distal aspects without stenosis. The bilateral posterior communicating arteries are patent. Anatomic variants: None significant IMPRESSION: 1. No acute intracranial process. No evidence of acute or subacute infarct. 2. No intracranial large vessel occlusion or significant stenosis. Previously seen narrowing of the MCAs and PCAs on the CTA is not apparent on this exam and may have been secondary to poor vascular opacification. Electronically Signed   By: Merilyn Baba M.D.   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 Does the need for close, 24 hr/day medical supervision in concert with the patient's rehab needs make it unreasonable for this patient to be served in a less intensive setting? Yes Co-Morbidities requiring supervision/potential complications:  -Post stroke sequela I -Hypertension management -Neurogenic headaches/ history of migraines -Recent COVID infection Due to bladder management, bowel management, safety,  skin/wound care, disease management, medication administration, pain management, and patient education, does the patient require 24 hr/day rehab nursing? Yes Does the patient require coordinated care of a physician, rehab nurse, therapy disciplines of PT, OT, SLP to address physical and functional deficits in the context of the above medical diagnosis(es)? Yes Addressing deficits in the following areas: balance, endurance, locomotion, strength, transferring, bowel/bladder control, bathing, dressing, feeding, grooming, toileting, speech, and psychosocial support Can the patient actively participate in an intensive therapy program of at least 3 hrs of therapy per day at least 5 days per week? Yes The potential for patient to make measurable gains while on inpatient rehab is excellent Anticipated functional outcomes upon discharge from inpatient rehab are modified independent and supervision  with PT, modified independent and supervision with OT, modified independent with SLP. Estimated rehab length of stay to reach the above functional goals is: 11-15 days Anticipated discharge destination: Home Overall Rehab/Functional Prognosis: excellent   POST ACUTE RECOMMENDATIONS: This patient's condition is appropriate for continued rehabilitative care in the following setting: CIR Patient has agreed to participate in recommended program. Yes Note that insurance prior authorization may be required for reimbursement for recommended care.   Comment: Pt is motivated to work on rehab and return to her prior level of function. Rehab Admissions Coordinator to follow up      I have personally performed a face to face diagnostic evaluation of this patient. Additionally, I have examined the patient's medical record including any pertinent labs and radiographic images. If the physician assistant has documented in this note, I have reviewed and edited or otherwise concur with the physician assistant's documentation.    Thanks,   Meredith Staggers, MD 10/06/2022

## 2022-10-08 NOTE — Plan of Care (Signed)
  Problem: Consults Goal: RH STROKE PATIENT EDUCATION Description: See Patient Education module for education specifics  Outcome: Progressing   Problem: RH BOWEL ELIMINATION Goal: RH STG MANAGE BOWEL WITH ASSISTANCE Description: STG Manage Bowel with supv Assistance. Outcome: Progressing Goal: RH STG MANAGE BOWEL W/MEDICATION W/ASSISTANCE Description: STG Manage Bowel with Medication with supv Assistance. Outcome: Progressing   Problem: RH BLADDER ELIMINATION Goal: RH STG MANAGE BLADDER WITH ASSISTANCE Description: STG Manage Bladder With supv Assistance Outcome: Progressing   Problem: RH SKIN INTEGRITY Goal: RH STG SKIN FREE OF INFECTION/BREAKDOWN Description: Skin will remain free of infection/breakdown with min assist Outcome: Progressing   Problem: RH SAFETY Goal: RH STG ADHERE TO SAFETY PRECAUTIONS W/ASSISTANCE/DEVICE Description: STG Adhere to Safety Precautions With cueing Assistance/Device. Outcome: Progressing   Problem: RH COGNITION-NURSING Goal: RH STG ANTICIPATES NEEDS/CALLS FOR ASSIST W/ASSIST/CUES Description: STG Anticipates Needs/Calls for Assist With cuing  Assistance/Cues. Outcome: Progressing   Problem: RH PAIN MANAGEMENT Goal: RH STG PAIN MANAGED AT OR BELOW PT'S PAIN GOAL Description: Pain will be managed 4 out of 10 on pain scale with PRN medications min assist Outcome: Progressing   Problem: RH KNOWLEDGE DEFICIT Goal: RH STG INCREASE KNOWLEDGE OF STROKE PROPHYLAXIS Description: Patient/caregiver will be able to manage medications and diet from nursing education and nursing handouts independently  Outcome: Progressing   Problem: Education: Goal: Knowledge of disease or condition will improve Outcome: Progressing Goal: Knowledge of secondary prevention will improve (MUST DOCUMENT ALL) Outcome: Progressing Goal: Knowledge of patient specific risk factors will improve [place] N/A or DELETE if not current risk factor) Outcome: Progressing   Problem:  Ischemic Stroke/TIA Tissue Perfusion: Goal: Complications of ischemic stroke/TIA will be minimized Outcome: Progressing   Problem: Coping: Goal: Will verbalize positive feelings about self Outcome: Progressing Goal: Will identify appropriate support needs Outcome: Progressing   Problem: Health Behavior/Discharge Planning: Goal: Ability to manage health-related needs will improve Outcome: Progressing Goal: Goals will be collaboratively established with patient/family Outcome: Progressing   Problem: Self-Care: Goal: Ability to participate in self-care as condition permits will improve Outcome: Progressing Goal: Verbalization of feelings and concerns over difficulty with self-care will improve Outcome: Progressing Goal: Ability to communicate needs accurately will improve Outcome: Progressing   Problem: Nutrition: Goal: Risk of aspiration will decrease Outcome: Progressing Goal: Dietary intake will improve Outcome: Progressing

## 2022-10-08 NOTE — H&P (Signed)
Physical Medicine and Rehabilitation Admission H&P        Chief Complaint  Patient presents with   Functional deficits   Stroke      HPI:  Donna Mccarthy is a 47 year old RH-female with history of migraines, anxiety, Vitamin D deficiency, Covid 3 weeks PTA; who was admitted on 10/03/22 with left facial droop, blurred vision and slurred speech that started while she was driving. She was found to have severe dysarthria with left facial droop that was variable and severely and b/l CN VI incomplete palsy.   CTA without LVO and  and she received TNK.  MRI/MRA brain done and negative for stroke, stenosis or acute process. 2D echo showed EF 60-65% with no wall abnormalities. She had dysconjugate gaze with diplopia that Dr. Erlinda Hong felt was more consistent with possible brainstem infarct secondary to small vessel disease and not seen on MRI. She was noted to have right sided weakness with sensory deficits. Neurology recommended DAPT X 3 weeks followed by ASA alone.     Cardiology consulted and TEE done 02/20 revealing EF 60-65% with no wall abnormality but showed possible intrapulmonary shunt and small PFO --TCD recommended for further evaluation. TCD done today and negative for right to left shunt.  PT/OT has been working with patient who continues to be limited by double vision, right sided weakness with decreased in motor control and right sided  sensory deficits affecting ability to carry out ADLs and mobility. CIR recommended due to functional decline.    Patient reports some continued mild esophageal discomfort after TEE.  She reports mild headache earlier today however this has improved.  She feels like her double vision is gradually improving.  She continues to have weakness and sensory changes on her right side.   Review of Systems  Constitutional:  Negative for chills and fever.  HENT:  Negative for hearing loss and tinnitus.   Eyes:  Positive for double vision.  Respiratory:  Positive  for cough.   Cardiovascular:  Negative for chest pain.  Gastrointestinal:  Positive for constipation (uses colace twice a week) and heartburn. Negative for abdominal pain.  Genitourinary:  Negative for dysuria and urgency.  Musculoskeletal:  Negative for back pain and neck pain.  Neurological:  Positive for focal weakness and headache. Negative for dizziness   Psychiatric/Behavioral:  The patient is not nervous/anxious and does not have insomnia.           Past Medical History:  Diagnosis Date   Allergy     Anemia     Anxiety     Arthritis      arthritis knees   Blood transfusion without reported diagnosis     GERD (gastroesophageal reflux disease)     Hypothyroidism     Neuromuscular disorder (HCC)     Obesity (BMI 30-39.9) 01/05/2019           Past Surgical History:  Procedure Laterality Date   BIOPSY   04/03/2020    Procedure: BIOPSY;  Surgeon: Harvel Quale, MD;  [facility];  Service: Gastroenterology;;  antral, body, distal esophagus, mid esophagus   BRACHIOPLASTY Bilateral 07/02/2020    Procedure: BILATERAL BRACHIOPLASTY WITH LIPOSUCTION;  Surgeon: [name], MD;  Location: [facility];  Service: Plastics;  Laterality: Bilateral;   DILATION AND CURETTAGE OF UTERUS       ESOPHAGOGASTRODUODENOSCOPY (EGD) WITH PROPOFOL N/A 04/03/2020    Procedure: ESOPHAGOGASTRODUODENOSCOPY (EGD) WITH PROPOFOL;  Surgeon:  Harvel Quale, MD;  [facility];  Service: Gastroenterology;  Laterality: N/A;  11:15   LEEP   08/23/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   LIPOSUCTION Bilateral 07/02/2020    Procedure: LIPOSUCTION;  Surgeon: [name], MD;  Location: [facility];  Service: Plastics;  Laterality: Bilateral;   PANNICULECTOMY N/A 08/04/2019    Procedure: PANNICULECTOMY;  Surgeon: [name], MD;  Location: [facility];  Service: Plastics;  Laterality: N/A;  2 hours only, please            Family History  Problem Relation Age of Onset   Hypertension Mother     Crohn's disease Mother     Colon polyps Mother     Hypertension Father          POTS   Stroke Father          TIA   Hyperlipidemia Father     Hypertension Brother     Breast cancer Maternal Grandmother     Diabetes Maternal Grandmother     Heart failure Maternal Grandmother     Thyroid disease Paternal Grandmother     Vitamin D deficiency Paternal Grandmother     Heart failure Paternal Grandfather     Heart attack Paternal Grandfather     Colon cancer Maternal Uncle          dx'd in 70's   Colon cancer Maternal Uncle 58   Breast cancer Maternal Aunt        Social History:   Lives alone. 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 Has 75 year old son who lives in [place]. She  reports that she has never smoked. She has never used smokeless tobacco. She reports that she does not drink alcohol and does not use drugs.          Allergies  Allergen Reactions   Ketorolac Tromethamine Other (See Comments)      "felt like whole body was on fire"   Penicillins Other (See Comments)      Mother said she had a rash   Celebrex [Celecoxib] Itching      itching            Medications Prior to Admission  Medication Sig Dispense Refill   aspirin-acetaminophen-caffeine (EXCEDRIN MIGRAINE) 250-250-65 MG tablet Take 1 tablet by mouth as needed for headache.       buPROPion (WELLBUTRIN XL) 300 MG 24 hr tablet Take 1 tablet (300 mg total) by mouth daily. 90 tablet 0   fluticasone (FLONASE) 50 MCG/ACT nasal spray Place 2 sprays into both nostrils as needed for allergies.       levonorgestrel (MIRENA) 20 MCG/24HR IUD 1 each by Intrauterine route once.       loratadine (CLARITIN) 10 MG tablet Take 10 mg by mouth daily as needed for allergies.       Magnesium 250 MG TABS Take 250 mg by mouth daily.       melatonin 3 MG TABS tablet Take 6 mg by mouth at bedtime.       norethindrone (AYGESTIN) 5 MG tablet Take 2 tablets by  mouth once daily (Patient taking differently: Take 10 mg by mouth as needed (heavy bleeding).) 30 tablet 1   SUMAtriptan (IMITREX) 100 MG tablet Take 1 tablet (100 mg total) by mouth once for 1 dose. May repeat in 2 hours if headache persists or recurs. (Patient taking differently: Take 100 mg by mouth every 2 (  two) hours as needed for headache or migraine.) 10 tablet 1   thyroid (NP THYROID) 120 MG tablet Take 1 tablet (120 mg total) by mouth daily on an empty stomach. 30 tablet 5   thyroid (NP THYROID) 60 MG tablet Take 1 tablet by mouth on an empty stomach once a day 30 tablet 5   topiramate (TOPAMAX) 25 MG tablet Take 3 tablets (75 mg total) by mouth at bedtime. 270 tablet 3   Vitamin D, Ergocalciferol, (DRISDOL) 1.25 MG (50000 UNIT) CAPS capsule Take 1 capsule (50,000 Units total) by mouth every 7 (seven) days. 12 capsule 0   Semaglutide-Weight Management (WEGOVY) 0.25 MG/0.5ML SOAJ Inject 0.25 mg into the skin once a week. (Patient not taking: Reported on 08/25/2022) 2 mL 5          Home: Home Living Family/patient expects to be discharged to:: Private residence Living Arrangements: Alone Available Help at Discharge: Family, Friend(s), Available PRN/intermittently (?Electronics engineer (son) come home from school if 24/7 needed) Type of Home: House (townhouse) Home Access: Stairs to enter Technical brewer of Steps: 1 Home Layout: Two level, Bed/bath upstairs Alternate Level Stairs-Number of Steps: flight Bathroom Shower/Tub: Chiropodist: Standard Bathroom Accessibility: Yes Home Equipment: None Additional Comments: works as a Technical brewer for [employer] With: Alone   Functional History: Prior Function Prior Level of Function : Independent/Modified Independent, Driving, 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[employer])   Functional Status:  Mobility: Bed Mobility Overal bed mobility: Modified Independent Bed Mobility: Supine to Sit, Sit to Supine Supine to sit:  Min guard, HOB elevated Sit to supine: Min assist (for RLE) General bed mobility comments: up in recliner at beginning and end of session Transfers Overall transfer level: Needs assistance Equipment used: Rolling walker (2 wheels) Transfers: Sit to/from Stand Sit to Stand: Min guard Bed to/from chair/wheelchair/BSC transfer type:: Step pivot Step pivot transfers: Min guard General transfer comment: Min guard A to initially steady and for safety Ambulation/Gait Ambulation/Gait assistance: Min guard Gait Distance (Feet): 80 Feet Assistive device: Rolling walker (2 wheels) Gait Pattern/deviations: Step-through pattern, Decreased stride length, Decreased weight shift to right, Decreased stance time - right General Gait Details: Min cues for increased L step length, improved R heel strike at initial contact. Mildly increased gait speed. Tactile facilitation for R posterior pelvic rotation and L shoulder internal rotation to achieve midline Gait velocity: decreased Gait velocity interpretation: <1.8 ft/sec, indicate of risk for recurrent falls Stairs: Yes Stairs assistance: Min guard Stair Management: Two rails Number of Stairs: 23   ADL: ADL Overall ADL's : Needs assistance/impaired Eating/Feeding: Modified independent, Sitting Eating/Feeding Details (indicate cue type and reason): finishing up lunch, reports that it was ok self-feeding, slow and steady Grooming: Oral care, Supervision/safety, Standing, Wash/dry hands Grooming Details (indicate cue type and reason): Pt able to complete  entire task of brushing teeth including retrieving various grooming items from around the sink and applying toothpaste to toothbrush. Upper Body Bathing: Min guard, Sitting Lower Body Bathing: Minimal assistance, Sitting/lateral leans Upper Body Dressing : Minimal assistance Upper Body Dressing Details (indicate cue type and reason): doff extra gown like robe Lower Body Dressing: Moderate assistance,  Sitting/lateral leans Lower Body Dressing Details (indicate cue type and reason): for socks Toilet Transfer: Min guard, Minimal assistance, Ambulation, Rolling walker (2 wheels) Toilet Transfer Details (indicate cue type and reason): to bathroom Toileting- Clothing Manipulation and Hygiene: Modified independent, Sitting/lateral lean Functional mobility during ADLs: Minimal assistance (1 person HHA) General ADL Comments: R  sided weakness, vision impacted   Cognition: Cognition Overall Cognitive Status: Within Functional Limits for tasks assessed Arousal/Alertness: Awake/alert Orientation Level: Oriented X4 Year: 2024 Month: February Day of Week: Correct Attention: Focused, Sustained Focused Attention: Appears intact Sustained Attention: Appears intact Memory: Impaired Memory Impairment: Decreased recall of new information, Decreased short term memory Awareness: Appears intact Executive Function: Organizing, Self Monitoring Organizing: Impaired Cognition Arousal/Alertness: Awake/alert Behavior During Therapy: WFL for tasks assessed/performed Overall Cognitive Status: Within Functional Limits for tasks assessed General Comments: deliberate speech, able to answer all questions appropriately     Blood pressure (!) 96/57, pulse 86, temperature (!) 97.3 F (36.3 C), temperature source Oral, resp. rate 16, height 5' 5"$  (1.651 m), weight 82.3 kg, last menstrual period 09/22/2022, SpO2 100 %.     General: Alert and oriented x 3, No apparent distress HEENT: Head is normocephalic, atraumatic, PERRLA, sclera anicteric, oral mucosa pink and moist, dentition intact, ext ear canals clear,  Neck: Supple without JVD or lymphadenopathy Heart: Reg rate and rhythm. No murmurs rubs or gallops Chest: CTA bilaterally without wheezes, rales, or rhonchi; no distress Abdomen: Soft, non-tender, non-distended, bowel sounds positive. Extremities: No clubbing, cyanosis, or edema. Pulses are 2+ Psych: Pt's  affect is appropriate. Pt is cooperative Skin: Clean and intact without signs of breakdown Neuro: Alert and oriented x 4, slightly delayed/hesitant speech today.  No significant aphasia or dysarthria noted.  Follows commands.  Right lens taped on glasses.  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  Slight left facial droop.  Tongue midline.  Altered sensation over right face.  Able to name 3 objects and repeat. Strength 5 out of 5 left upper extremity and left lower extremity Strength 4 out of 5 right upper extremity and right lower extremity Sensation intact in all 4 extremities however altered over right arm and leg and face Finger-nose slightly slowed right greater than left No ankle clonus bilaterally Musculoskeletal: No abnormal tone, no range of motion deficits noted, no joint swelling or tenderness   Lab Results Last 48 Hours        Results for orders placed or performed during the hospital encounter of 10/03/22 (from the past 48 hour(s))  CBC     Status: Abnormal    Collection Time: 10/07/22  3:36 AM  Result Value Ref Range    WBC 3.7 (L) 4.0 - 10.5 K/uL    RBC 4.24 3.87 - 5.11 MIL/uL    Hemoglobin 11.1 (L) 12.0 - 15.0 g/dL    HCT 35.3 (L) 36.0 - 46.0 %    MCV 83.3 80.0 - 100.0 fL    MCH 26.2 26.0 - 34.0 pg    MCHC 31.4 30.0 - 36.0 g/dL    RDW 13.9 11.5 - 15.5 %    Platelets 166 150 - 400 K/uL    nRBC 0.0 0.0 - 0.2 %      Comment: Performed at [facility], [address], [place], [place] [id number]  Basic metabolic panel     Status: Abnormal    Collection Time: 10/07/22  3:36 AM  Result Value Ref Range    Sodium 139 135 - 145 mmol/L    Potassium 3.7 3.5 - 5.1 mmol/L    Chloride 109 98 - 111 mmol/L    CO2 21 (L) 22 - 32 mmol/L    Glucose, Bld 95 70 - 99 mg/dL      Comment: Glucose reference range applies only to samples taken  after fasting for at least 8 hours.    BUN 7 6 - 20 mg/dL    Creatinine, Ser 0.53 0.44  - 1.00 mg/dL    Calcium 9.0 8.9 - 10.3 mg/dL    GFR, Estimated >60 >60 mL/min      Comment: (NOTE) Calculated using the CKD-EPI Creatinine Equation (2021)      Anion gap 9 5 - 15      Comment: Performed at [facility] [address], [place], [place] [address]  CBC     Status: Abnormal    Collection Time: 10/08/22  8:17 AM  Result Value Ref Range    WBC 3.8 (L) 4.0 - 10.5 K/uL    RBC 4.38 3.87 - 5.11 MIL/uL    Hemoglobin 11.4 (L) 12.0 - 15.0 g/dL    HCT 36.6 36.0 - 46.0 %    MCV 83.6 80.0 - 100.0 fL    MCH 26.0 26.0 - 34.0 pg    MCHC 31.1 30.0 - 36.0 g/dL    RDW 13.9 11.5 - 15.5 %    Platelets 171 150 - 400 K/uL    nRBC 0.0 0.0 - 0.2 %      Comment: Performed at [facility], [place] [address], [place], [place] [id number]  Basic metabolic panel     Status: Abnormal    Collection Time: 10/08/22  8:17 AM  Result Value Ref Range    Sodium 137 135 - 145 mmol/L    Potassium 3.8 3.5 - 5.1 mmol/L    Chloride 107 98 - 111 mmol/L    CO2 22 22 - 32 mmol/L    Glucose, Bld 123 (H) 70 - 99 mg/dL      Comment: Glucose reference range applies only to samples taken after fasting for at least 8 hours.    BUN 7 6 - 20 mg/dL    Creatinine, Ser 0.66 0.44 - 1.00 mg/dL    Calcium 9.1 8.9 - 10.3 mg/dL    GFR, Estimated >60 >60 mL/min      Comment: (NOTE) Calculated using the CKD-EPI Creatinine Equation (2021)      Anion gap 8 5 - 15      Comment: Performed at [facility] [address], [place], [place] [address]  Vitamin B12     Status: None    Collection Time: 10/08/22  8:17 AM  Result Value Ref Range    Vitamin B-12 509 180 - 914 pg/mL      Comment: (NOTE) This assay is not validated for testing neonatal or myeloproliferative syndrome specimens for Vitamin B12 levels. Performed at [facility], [place] [address] [place], [place] [address]    TSH     Status: Abnormal    Collection Time: 10/08/22  8:17 AM  Result Value Ref Range    TSH 0.015 (L) 0.350 - 4.500  uIU/mL      Comment: Performed by a 3rd Generation assay with a functional sensitivity of <=0.01 uIU/mL. Performed at [facility], [place] [address], [place], [place] [address]    RPR     Status: None    Collection Time: 10/08/22  8:17 AM  Result Value Ref Range    RPR Ser Ql NON REACTIVE NON REACTIVE      Comment: Performed at [facility], [place] [address], [place], [place] [address]       Imaging Results (Last 48 hours)  VAS [place] TRANSCRANIAL DOPPLER W BUBBLES  Result Date: 10/08/2022  Transcranial Doppler with Bubble Patient Name:  KENICIA SICKING  Date of Exam:   10/08/2022 Medical Rec #: [id number]           Accession #:    [id number] Date of Birth: 1976-06-07            Patient Gender: F Patient Age:   46 years Exam Location:  [facility] Procedure:      VAS [place] TRANSCRANIAL DOPPLER W BUBBLES Referring Phys: Cornelius Moras XU --------------------------------------------------------------------------------  Indications: Stroke. Comparison Study: Echo TEE on 10-07-2022 Performing Technologist: Darlin Coco RDMS, RVT  Examination Guidelines: A complete evaluation includes B-mode imaging, spectral Doppler, color Doppler, and power Doppler as needed of all accessible portions of each vessel. Bilateral testing is considered an integral part of a complete examination. Limited examinations for reoccurring indications may be performed as noted.  Summary:  A vascular evaluation was performed. The left middle cerebral artery was studied. An IV was inserted into the patient's right wrist. Verbal informed consent was obtained.  No HITS at rest or during Valsalva. Negative transcranial Doppler Bubble study with no evidence of right to left intracardiac or intrapulmonary communication.  *See table(s) above for TCD measurements and observations.  Diagnosing physician: Rosalin Hawking MD Electronically signed by Rosalin Hawking MD on 10/08/2022 at 2:44:22 PM.    Final     ECHO TEE   Result Date: 10/07/2022     TRANSESOPHOGEAL ECHO REPORT   Patient Name:   FAYLEE SELLERS Date of Exam: 10/07/2022 Medical Rec #:  [id number]          Height:       65.0 in Accession #:    [id number]         Weight:       181.4 lb Date of Birth:  Jan 31, 1976           BSA:          1.898 m Patient Age:    44 years           BP:           100/83 mmHg Patient Gender: F                  HR:           94 bpm. Exam Location:  Inpatient Procedure: Transesophageal Echo, Color Doppler, Cardiac Doppler and Saline            Contrast Bubble Study Indications:     stroke  History:         Patient has prior history of Echocardiogram examinations, most                  recent 10/04/2022.  Sonographer:     Johny Chess RDCS Referring Phys:  [id number] Tami Lin DUKE Diagnosing Phys: Gwyndolyn Kaufman MD PROCEDURE: After discussion of the risks and benefits of a TEE, an informed consent was obtained from the patient. The transesophogeal probe was passed without difficulty through the esophogus of the patient. Imaged were obtained with the patient in a left lateral decubitus position. Sedation performed by different physician. The patient was monitored while under deep sedation. Anesthestetic sedation was provided intravenously by Anesthesiology: 725m of Propofol. The patient developed no complications during the procedure.  IMPRESSIONS  1. Left ventricular ejection fraction, by estimation, is 60 to 65%. The left ventricle has normal function.  2. Right ventricular systolic function is normal. The right ventricular size is normal.  3. No left atrial/left atrial appendage thrombus was detected.  4. The mitral valve is normal in structure. Trivial mitral valve regurgitation.  5. The aortic valve is tricuspid. Aortic valve regurgitation is not visualized. No aortic stenosis is present.  6. The atrial septum is redundant. No interatrial shunting detected by color doppler. Multiple agitated saline contrast bubble studies performed. The majority of bubbles  were seen late suggestive of intrapulmonary shunting. There were a small number of bubbles seen early during the study suggestive of a small PFO. Recommend transcranial doppler for further evaluation. FINDINGS  Left Ventricle: Left ventricular ejection fraction, by estimation, is 60 to 65%. The left ventricle has normal function. The left ventricular internal cavity size was normal in size. Right Ventricle: The right ventricular size is normal. No increase in right ventricular wall thickness. Right ventricular systolic function is normal. Left Atrium: Left atrial size was normal in size. No left atrial/left atrial appendage thrombus was detected. Right Atrium: Right atrial size was normal in size. Pericardium: There is no evidence of pericardial effusion. Mitral Valve: The mitral valve is normal in structure. Trivial mitral valve regurgitation. Tricuspid Valve: The tricuspid valve is normal in structure. Tricuspid valve regurgitation is trivial. No evidence of tricuspid stenosis. Aortic Valve: The aortic valve is tricuspid. Aortic valve regurgitation is not visualized. No aortic stenosis is present. Pulmonic Valve: The pulmonic valve was normal in structure. Pulmonic valve regurgitation is not visualized. Aorta: The aortic root is normal in size and structure. IAS/Shunts: The atrial septum is redundant. No interatrial shunting detected by color doppler. Multiple agitated saline contrast bubble studies performed. The majority of bubbles were seen late suggestive of intrapulmonary shunting. There were a small number of bubbles seen early during the study suggestive of a small PFO. Recommend transcranial doppler for further evaluation. Agitated saline contrast was given intravenously to evaluate for intracardiac shunting. Gwyndolyn Kaufman MD Electronically signed by Gwyndolyn Kaufman MD Signature Date/Time: 10/07/2022/4:25:23 PM    Final            Blood pressure (!) 96/57, pulse 86, temperature (!) 97.3 F (36.3  C), temperature source Oral, resp. rate 16, height 5' 5"$  (1.651 m), weight 82.3 kg, last menstrual period 09/22/2022, SpO2 100 %.   Medical Problem List and Plan: 1. 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  Antithrombotics: -DVT/anticoagulation:  Pharmaceutical: Lovenox             -antiplatelet therapy: DAPT for 3 weeks followed by aspirin alone 3. Pain Management: Fioricet prn for HA 4. Mood/Behavior/Sleep: LCSW to follow for evaluation and support.              --trazodone prn for chronic insomnia (melatonin not very effective)             -antipsychotic agents: N/A 5. Neuropsych/cognition: This patient is capable of making decisions on her own behalf. 6. Skin/Wound Care:  Routine pressure relief measures 7. Fluids/Electrolytes/Nutrition:  Monitor I/O. Check CMET in am 8. Hypotension: SBP in 90's but asmptomatic?,  Monitor for orthostatic symptoms --Encourage fluid intake 9. Migraines: Has been managed with use of Topamax and Imitrex twice a month on average.  10.Transient  hypokalemia: Resolved w/ supplement.  Last K+ 3.8 on 2/21 recheck in am.  11. Low vitamin D level: Ergocalciferol-21.7 on 08/25/22             --resume supplement.   12. Neutropenia: WBC 3.8 on 2/21 recheck WBC in am.  13. Intermittent constipation: No issues so far. Used colace 2x /wk prn at home.  14.  Hyperlipidemia.  Continue Lipitor 84m   I have personally performed a face to face diagnostic evaluation of this patient and formulated the key components of the plan.  Additionally, I have personally reviewed laboratory data, imaging studies, as well as relevant notes and concur with the physician assistant's documentation above.   The patient's status has not changed from the original H&P.  Any changes in documentation from the acute care chart have been  noted above.   YJennye Boroughs MD, FMellody Drown    PBary Leriche PA-C 10/08/2022

## 2022-10-08 NOTE — Progress Notes (Signed)
TCD study completed.   Please see CV Proc for preliminary results.   Darlin Coco, RDMS, RVT

## 2022-10-08 NOTE — Progress Notes (Addendum)
Inpatient Rehab Admissions Coordinator:   Received insurance approval.  Spoke to pt about discharge plan.  She has arranged for some of her friends to stay with her at discharge Kennyth Lose and [place]).  I left a message for Kennyth Lose to discuss goals/expectations and I also left a message for her son to update.  I will need to confirm discharge plan before I can offer a bed.   1515: Discussed with pt's cousin, Kennyth Lose.  We discussed supervision to mod I goals, indicating pt would probably be safe to be alone intermittently, but that ideally we would plan for 24/7 supervision initially at discharge.  She is aware, and is working with family/friends to set up a schedule for people to check on pt and stay with her.  I let her know that we would anticipate discharge as early as 7-10 days and she is in agreement.  Will plan to admit today.   Shann Medal, PT, DPT Admissions Coordinator [phone] 10/08/22  11:01 AM

## 2022-10-09 ENCOUNTER — Telehealth: Payer: Self-pay | Admitting: *Deleted

## 2022-10-09 DIAGNOSIS — I6389 Other cerebral infarction: Secondary | ICD-10-CM | POA: Diagnosis not present

## 2022-10-09 LAB — COMPREHENSIVE METABOLIC PANEL
ALT: 18 U/L (ref 0–44)
AST: 21 U/L (ref 15–41)
Albumin: 3.2 g/dL — ABNORMAL LOW (ref 3.5–5.0)
Alkaline Phosphatase: 58 U/L (ref 38–126)
Anion gap: 8 (ref 5–15)
BUN: 7 mg/dL (ref 6–20)
CO2: 21 mmol/L — ABNORMAL LOW (ref 22–32)
Calcium: 9.1 mg/dL (ref 8.9–10.3)
Chloride: 109 mmol/L (ref 98–111)
Creatinine, Ser: 0.64 mg/dL (ref 0.44–1.00)
GFR, Estimated: >60 mL/min (ref >60)
Glucose, Bld: 98 mg/dL (ref 70–99)
Potassium: 3.9 mmol/L (ref 3.5–5.1)
Sodium: 138 mmol/L (ref 135–145)
Total Bilirubin: 0.8 mg/dL (ref 0.3–1.2)
Total Protein: 6.4 g/dL — ABNORMAL LOW (ref 6.5–8.1)

## 2022-10-09 LAB — CBC WITH DIFFERENTIAL/PLATELET
Abs Immature Granulocytes: 0.01 10*3/uL (ref 0.00–0.07)
Basophils Absolute: 0 10*3/uL (ref 0.0–0.1)
Basophils Relative: 1 %
Eosinophils Absolute: 0.1 10*3/uL (ref 0.0–0.5)
Eosinophils Relative: 4 %
HCT: 33.7 % — ABNORMAL LOW (ref 36.0–46.0)
Hemoglobin: 10.9 g/dL — ABNORMAL LOW (ref 12.0–15.0)
Immature Granulocytes: 0 %
Lymphocytes Relative: 46 %
Lymphs Abs: 1.5 10*3/uL (ref 0.7–4.0)
MCH: 26.7 pg (ref 26.0–34.0)
MCHC: 32.3 g/dL (ref 30.0–36.0)
MCV: 82.4 fL (ref 80.0–100.0)
Monocytes Absolute: 0.4 10*3/uL (ref 0.1–1.0)
Monocytes Relative: 12 %
Neutro Abs: 1.2 10*3/uL — ABNORMAL LOW (ref 1.7–7.7)
Neutrophils Relative %: 37 %
Platelets: 153 10*3/uL (ref 150–400)
RBC: 4.09 MIL/uL (ref 3.87–5.11)
RDW: 13.9 % (ref 11.5–15.5)
WBC: 3.2 10*3/uL — ABNORMAL LOW (ref 4.0–10.5)
nRBC: 0 % (ref 0.0–0.2)

## 2022-10-09 LAB — ANA W/REFLEX IF POSITIVE: Anti Nuclear Antibody (ANA): NEGATIVE

## 2022-10-09 NOTE — Plan of Care (Signed)
Problem: RH Balance Goal: LTG Patient will maintain dynamic standing with ADLs (OT) Description: LTG:  Patient will maintain dynamic standing balance with assist during activities of daily living (OT)  Flowsheets (Taken 10/09/2022 1521) LTG: Pt will maintain dynamic standing balance during ADLs with: Independent with assistive device   Problem: Sit to Stand Goal: LTG:  Patient will perform sit to stand in prep for activites of daily living with assistance level (OT) Description: LTG:  Patient will perform sit to stand in prep for activites of daily living with assistance level (OT) Flowsheets (Taken 10/09/2022 1521) LTG: PT will perform sit to stand in prep for activites of daily living with assistance level: Independent with assistive device   Problem: RH Eating Goal: LTG Patient will perform eating w/assist, cues/equip (OT) Description: LTG: Patient will perform eating with assist, with/without cues using equipment (OT) Flowsheets (Taken 10/09/2022 1521) LTG: Pt will perform eating with assistance level of: Independent   Problem: RH Grooming Goal: LTG Patient will perform grooming w/assist,cues/equip (OT) Description: LTG: Patient will perform grooming with assist, with/without cues using equipment (OT) Flowsheets (Taken 10/09/2022 1521) LTG: Pt will perform grooming with assistance level of: Independent   Problem: RH Bathing Goal: LTG Patient will bathe all body parts with assist levels (OT) Description: LTG: Patient will bathe all body parts with assist levels (OT) Flowsheets (Taken 10/09/2022 1521) LTG: Pt will perform bathing with assistance level/cueing: Independent with assistive device    Problem: RH Dressing Goal: LTG Patient will perform upper body dressing (OT) Description: LTG Patient will perform upper body dressing with assist, with/without cues (OT). Flowsheets (Taken 10/09/2022 1521) LTG: Pt will perform upper body dressing with assistance level of: Independent with  assistive device Goal: LTG Patient will perform lower body dressing w/assist (OT) Description: LTG: Patient will perform lower body dressing with assist, with/without cues in positioning using equipment (OT) Flowsheets (Taken 10/09/2022 1521) LTG: Pt will perform lower body dressing with assistance level of: Independent with assistive device   Problem: RH Toileting Goal: LTG Patient will perform toileting task (3/3 steps) with assistance level (OT) Description: LTG: Patient will perform toileting task (3/3 steps) with assistance level (OT)  Flowsheets (Taken 10/09/2022 1521) LTG: Pt will perform toileting task (3/3 steps) with assistance level: Independent with assistive device   Problem: RH Vision Goal: RH LTG Vision Theme park manager) Flowsheets (Taken 10/09/2022 1521) LTG: Vision Goals: Patient will be able to locate 2 items in peripherals without cues   Problem: RH Functional Use of Upper Extremity Goal: LTG Patient will use RT/LT upper extremity as a (OT) Description: LTG: Patient will use right/left upper extremity as a stabilizer/gross assist/diminished/nondominant/dominant level with assist, with/without cues during functional activity (OT) Flowsheets (Taken 10/09/2022 1521) LTG: Use of upper extremity in functional activities: RUE as dominant level   Problem: RH Simple Meal Prep Goal: LTG Patient will perform simple meal prep w/assist (OT) Description: LTG: Patient will perform simple meal prep with assistance, with/without cues (OT). Flowsheets (Taken 10/09/2022 1521) LTG: Pt will perform simple meal prep with assistance level of: Supervision/Verbal cueing   Problem: RH Light Housekeeping Goal: LTG Patient will perform light housekeeping w/assist (OT) Description: LTG: Patient will perform light housekeeping with assistance, with/without cues (OT). Flowsheets (Taken 10/09/2022 1521) LTG: Pt will perform light housekeeping with assistance level of: Supervision/Verbal cueing   Problem: RH  Toilet Transfers Goal: LTG Patient will perform toilet transfers w/assist (OT) Description: LTG: Patient will perform toilet transfers with assist, with/without cues using equipment (OT) Flowsheets (Taken 10/09/2022  1521) LTG: Pt will perform toilet transfers with assistance level of: Independent with assistive device   Problem: RH Tub/Shower Transfers Goal: LTG Patient will perform tub/shower transfers w/assist (OT) Description: LTG: Patient will perform tub/shower transfers with assist, with/without cues using equipment (OT) Flowsheets (Taken 10/09/2022 1521) LTG: Pt will perform tub/shower stall transfers with assistance level of: Independent with assistive device

## 2022-10-09 NOTE — Evaluation (Signed)
Speech Language Pathology Assessment and Plan  Patient Details  Name: Donna Mccarthy MRN: [id number] Date of Birth: June 07, 1976  SLP Diagnosis: Cognitive Impairments  Rehab Potential: Excellent ELOS: 10-12 days   Today's Date: 10/09/2022 SLP Individual Time: 1100-1200 SLP Individual Time Calculation (min): 60 min  Hospital Problem: Principal Problem:   Stroke (cerebrum) [facility])  Past Medical History:  Past Medical History:  Diagnosis Date   Allergy    Anemia    Anxiety    Arthritis    arthritis knees   Blood transfusion without reported diagnosis    GERD (gastroesophageal reflux disease)    Hypothyroidism    Neuromuscular disorder ([place])    Obesity (BMI 30-39.9) 01/05/2019   Past Surgical History:  Past Surgical History:  Procedure Laterality Date   BIOPSY  04/03/2020   Procedure: BIOPSY;  Surgeon: Harvel Quale, MD;  [facility];  Service: Gastroenterology;;  antral, body, distal esophagus, mid esophagus   BRACHIOPLASTY Bilateral 07/02/2020   Procedure: BILATERAL BRACHIOPLASTY WITH LIPOSUCTION;  Surgeon: [name], MD;  Location: [place];  Service: Plastics;  Laterality: Bilateral;   DILATION AND CURETTAGE OF UTERUS  2022   ESOPHAGOGASTRODUODENOSCOPY (EGD) WITH PROPOFOL N/A 04/03/2020   Procedure: ESOPHAGOGASTRODUODENOSCOPY (EGD) WITH PROPOFOL;  Surgeon: Harvel Quale, MD;  [facility];  Service: Gastroenterology;  Laterality: N/A;  11:15   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   LIPOSUCTION Bilateral 07/02/2020   Procedure: LIPOSUCTION;  Surgeon: [name], MD;  Location: [place];  Service: Plastics;  Laterality: Bilateral;   PANNICULECTOMY N/A 08/04/2019   Procedure: PANNICULECTOMY;  Surgeon: [name], MD;  Location: [place];  Service: Plastics;  Laterality: N/A;  2 hours only, please   TEE WITHOUT CARDIOVERSION N/A 10/07/2022   Procedure:  TRANSESOPHAGEAL ECHOCARDIOGRAM (TEE);  Surgeon: Freada Bergeron, MD;  [facility];  Service: Cardiovascular;  Laterality: N/A;    Assessment / Plan / Recommendation Clinical Impression Donna Mccarthy is a 47 year old RH-female with history of migraines, anxiety, Vitamin D deficiency, Covid 3 weeks PTA; who was admitted on 10/03/22 with left facial droop, blurred vision and slurred speech that started while she was driving. She was found to have severe dysarthria with left facial droop that was variable and severely and b/l CN VI incomplete palsy. MRI/MRA brain done and negative for stroke, stenosis or acute process. She had dysconjugate gaze with diplopia that Dr. Erlinda Hong felt was more consistent with possible brainstem infarct secondary to small vessel disease and not seen on MRI. She was noted to have right sided weakness with sensory deficits. PT/OT has been working with patient who continues to be limited by double vision, right sided weakness with decreased in motor control and right sided sensory deficits affecting ability to carry out ADLs and mobility. CIR recommended due to functional decline.   Pt presents with overall mild cognitive-linguistic deficits characterized by decreased immediate and short-term recall, calculations, and visuospatial skills. Administered the Cognistat and patient scored WNL for orientation, verbal repetition, naming, memory, and executive function; moderate impairment range for attention and calculations. Attention subtest suspected to be impacted by working memory deficits. Subjectively, pt occasionally lost train of thought mid sentence, and reported a friend brought to her attention that pt had asked the same question without recall of a conversation they had the day before. Pt also reported some word finding hesitation which was observed x2 during structured naming assessment  and once during conversation. Comprehension appears grossly intact with  additional processing time. OMR was unremarkable for slight L facial asymmetry which did not negatively impact speech intelligibility. Pt supports tolerance of regular textures and thin liquids without chewing/swallowing difficulty. SLP recommends skilled ST intervention for cognitive-linguistic skills and maximize functional independence before discharge.    Skilled Therapeutic Interventions          Pt participated in the [place], as well as other informal speech/language/cognitive assessment measures. See report for full details.    SLP Assessment  Patient will need skilled Speech Lanaguage Pathology Services during CIR admission    Recommendations  Recommendations for Other Services: Therapeutic Recreation consult Therapeutic Recreation Interventions: Pet therapy Patient destination: Home Follow up Recommendations: Other (comment) (TBD) Equipment Recommended: None recommended by SLP    SLP Frequency 3 to 5 out of 7 days   SLP Duration  SLP Intensity  SLP Treatment/Interventions 10-12 days  Minumum of 1-2 x/day, 30 to 90 minutes  Cognitive remediation/compensation;Functional tasks;Patient/family education;Therapeutic Activities    Pain Pain Assessment Pain Scale: 0-10 Pain Score: 0-No pain  Prior Functioning Cognitive/Linguistic Baseline: Within functional limits Type of Home: House  Lives With: Alone Available Help at Discharge: Family;Friend(s);Available PRN/intermittently Education: college Vocation: Full time employment  SLP Evaluation Cognition Overall Cognitive Status: Impaired/Different from baseline Arousal/Alertness: Awake/alert Orientation Level: Oriented X4 Year: 2024 Month: February Day of Week: Correct Attention: Focused;Sustained;Selective Focused Attention: Appears intact Sustained Attention: Appears intact Selective Attention: Appears intact Memory: Impaired Memory Impairment: Decreased recall of new information;Decreased short term  memory Awareness: Appears intact Safety/Judgment: Appears intact  Comprehension Auditory Comprehension Overall Auditory Comprehension: Appears within functional limits for tasks assessed EffectiveTechniques: Extra processing time Expression Expression Primary Mode of Expression: Verbal Verbal Expression Overall Verbal Expression: Appears within functional limits for tasks assessed Other Verbal Expression Comments: occasional word finding hesitation Oral Motor Oral Motor/Sensory Function Overall Oral Motor/Sensory Function: Mild impairment (slight) Facial ROM: Reduced left Facial Symmetry: Abnormal symmetry left Facial Strength: Within Functional Limits Facial Sensation: Within Functional Limits Lingual ROM: Within Functional Limits Lingual Symmetry: Within Functional Limits Lingual Strength: Within Functional Limits Lingual Sensation: Within Functional Limits Velum: Within Functional Limits Mandible: Within Functional Limits Motor Speech Overall Motor Speech: Appears within functional limits for tasks assessed Respiration: Within functional limits Phonation: Normal Resonance: Within functional limits Articulation: Within functional limitis Intelligibility: Intelligible Motor Planning: Witnin functional limits Motor Speech Errors: Not applicable  Care Tool Care Tool Cognition Ability to hear (with hearing aid or hearing appliances if normally used Ability to hear (with hearing aid or hearing appliances if normally used): 0. Adequate - no difficulty in normal conservation, social interaction, listening to TV   Expression of Ideas and Wants Expression of Ideas and Wants: 3. Some difficulty - exhibits some difficulty with expressing needs and ideas (e.g, some words or finishing thoughts) or speech is not clear   Understanding Verbal and Non-Verbal Content Understanding Verbal and Non-Verbal Content: 3. Usually understands - understands most conversations, but misses some  part/intent of message. Requires cues at times to understand  Memory/Recall Ability Memory/Recall Ability : Current season;That he or she is in a hospital/hospital unit   Intelligibility: Intelligible  Bedside Swallowing Assessment General Date of Onset: 10/03/22 Previous Swallow Assessment: 10/04/22 Diet Prior to this Study: Regular;Thin liquids (Level 0) Oral Cavity - Dentition: Adequate natural dentition Vision: Functional for self-feeding Baseline Vocal Quality: Normal Volitional Cough: Strong Volitional Swallow: Able to elicit   Short Term Goals: Week 1: SLP Short Term  Goal 1 (Week 1): Patient will utilize memory compensations to recall novel information with sup A verbal cues SLP Short Term Goal 2 (Week 1): Patient will complete problem solving pertaining to iADLs (e.g., medication/money management, with min A verbal cues SLP Short Term Goal 3 (Week 1): Patient will complete complex scheduling and organizing tasks with min A verbal cues SLP Short Term Goal 4 (Week 1): Patient will verbalize recall and utilize word finding strategies during structured and/or unstructured cognitive-linguistic/language tasks with sup A verbal cues  Refer to Care Plan for Long Term Goals  Recommendations for other services: Therapeutic Recreation  Pet therapy  Discharge Criteria: Patient will be discharged from SLP if patient refuses treatment 3 consecutive times without medical reason, if treatment goals not met, if there is a change in medical status, if patient makes no progress towards goals or if patient is discharged from hospital.  The above assessment, treatment plan, treatment alternatives and goals were discussed and mutually agreed upon: by patient  Patty Sermons 10/09/2022, 1:13 PM

## 2022-10-09 NOTE — Evaluation (Signed)
Physical Therapy Assessment and Plan  Patient Details  Name: Donna Mccarthy MRN: [id number] Date of Birth: [birth year]  PT Diagnosis: Abnormal posture, Abnormality of gait, Ataxia, Ataxic gait, Hemiplegia dominant, Impaired sensation, and Muscle weakness Rehab Potential: Good ELOS: 10-14 days   Today's Date: 10/09/2022 PT Individual Time: 1000-1100 PT Individual Time Calculation (min): 60 min    Hospital Problem: Principal Problem:   Stroke (cerebrum) [facility])   Past Medical History:  Past Medical History:  Diagnosis Date   Allergy    Anemia    Anxiety    Arthritis    arthritis knees   Blood transfusion without reported diagnosis    GERD (gastroesophageal reflux disease)    Hypothyroidism    Neuromuscular disorder ([place])    Obesity (BMI 30-39.9) 01/05/2019   Past Surgical History:  Past Surgical History:  Procedure Laterality Date   BIOPSY  04/03/2020   Procedure: BIOPSY;  Surgeon: Harvel Quale, MD;  [facility];  Service: Gastroenterology;;  antral, body, distal esophagus, mid esophagus   BRACHIOPLASTY Bilateral 07/02/2020   Procedure: BILATERAL BRACHIOPLASTY WITH LIPOSUCTION;  Surgeon: [name], MD;  Location: [place];  Service: Plastics;  Laterality: Bilateral;   DILATION AND CURETTAGE OF UTERUS  2022   ESOPHAGOGASTRODUODENOSCOPY (EGD) WITH PROPOFOL N/A 04/03/2020   Procedure: ESOPHAGOGASTRODUODENOSCOPY (EGD) WITH PROPOFOL;  Surgeon: Harvel Quale, MD;  [facility];  Service: Gastroenterology;  Laterality: N/A;  11:15   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   LIPOSUCTION Bilateral 07/02/2020   Procedure: LIPOSUCTION;  Surgeon: [name], MD;  Location: [place];  Service: Plastics;  Laterality: Bilateral;   PANNICULECTOMY N/A 08/04/2019   Procedure: PANNICULECTOMY;  Surgeon: [name], MD;  Location: [place];  Service: Plastics;  Laterality: N/A;   2 hours only, please   TEE WITHOUT CARDIOVERSION N/A 10/07/2022   Procedure: TRANSESOPHAGEAL ECHOCARDIOGRAM (TEE);  Surgeon: Freada Bergeron, MD;  Location: [facility] ENDOSCOPY;  Service: Cardiovascular;  Laterality: N/A;    Assessment & Plan Clinical Impression: Patient is a 47 year old RH-female with history of migraines, anxiety, Vitamin D deficiency, Covid 3 weeks PTA; who was admitted on 10/03/22 with left facial droop, blurred vision and slurred speech that started while she was driving. She was found to have severe dysarthria with left facial droop that was variable and severely and b/l CN VI incomplete palsy.   CTA without LVO and  and she received TNK.  MRI/MRA brain done and negative for stroke, stenosis or acute process. 2D echo showed EF 60-65% with no wall abnormalities. She had dysconjugate gaze with diplopia that Dr. Erlinda Hong felt was more consistent with possible brainstem infarct secondary to small vessel disease and not seen on MRI. She was noted to have right sided weakness with sensory deficits. Neurology recommended DAPT X 3 weeks followed by ASA alone.     Cardiology consulted and TEE done 02/20 revealing EF 60-65% with no wall abnormality but showed possible intrapulmonary shunt and small PFO --TCD recommended for further evaluation. TCD done today and negative for right to left shunt.  PT/OT has been working with patient who continues to be limited by double vision, right sided weakness with decreased in motor control and right sided  sensory deficits affecting ability to carry out ADLs and mobility. CIR recommended due to functional decline.    Patient reports some continued mild esophageal discomfort after TEE.  She reports mild headache earlier today  however this has improved.  She feels like her double vision is gradually improving.  She continues to have weakness and sensory changes on her right side.  Patient transferred to CIR on 10/08/2022 .   Patient currently requires mod with  mobility secondary to muscle weakness and muscle joint tightness, decreased cardiorespiratoy endurance, unbalanced muscle activation and decreased coordination, decreased visual acuity, decreased visual perceptual skills, and field cut, ideational apraxia, and decreased sitting balance, decreased standing balance, decreased postural control, hemiplegia, and decreased balance strategies.  Prior to hospitalization, patient was independent  with mobility and lived with Alone in a House (town house) home.  Home access is 1Stairs to enter.  Patient will benefit from skilled PT intervention to maximize safe functional mobility and minimize fall risk for planned discharge home with intermittent assist.  Anticipate patient will benefit from follow up OP at discharge.  PT - End of Session Activity Tolerance: Tolerates 10 - 20 min activity with multiple rests Endurance Deficit: Yes PT Assessment Rehab Potential (ACUTE/IP ONLY): Good PT Barriers to Discharge: [place] home environment;Decreased caregiver support;Home environment access/layout;Insurance for SNF coverage PT Patient demonstrates impairments in the following area(s): Balance;Endurance;Motor;Perception;Sensory PT Transfers Functional Problem(s): Bed Mobility;Bed to Chair;Car;Furniture;Floor PT Locomotion Functional Problem(s): Ambulation;Wheelchair Mobility;Stairs PT Plan PT Intensity: Minimum of 1-2 x/day ,45 to 90 minutes PT Frequency: 5 out of 7 days PT Duration Estimated Length of Stay: 10-14 days PT Treatment/Interventions: Balance/vestibular training;Cognitive remediation/compensation;Disease management/prevention;Discharge planning;DME/adaptive equipment instruction;Community reintegration;Ambulation/gait training;Functional electrical stimulation;Neuromuscular re-education;Pain management;Functional mobility training;Patient/family education;Skin care/wound management;Splinting/orthotics;Psychosocial support;Stair training;UE/LE  Coordination activities;Therapeutic Activities;Therapeutic Exercise;Visual/perceptual remediation/compensation;UE/LE Strength taining/ROM;Wheelchair propulsion/positioning PT Transfers Anticipated Outcome(s): Mod I with LRAD PT Locomotion Anticipated Outcome(s): Mod I ambulatory with LRAD PT Recommendation Recommendations for Other Services: Therapeutic Recreation consult Therapeutic Recreation Interventions: Outing/community reintergration;Kitchen group;Pet therapy Follow Up Recommendations: Outpatient PT Patient destination: Home Equipment Recommended: To be determined;Rolling walker with 5" wheels;Cane   PT Evaluation Precautions/Restrictions Precautions Precautions: Fall Precaution Comments: double vision Restrictions Weight Bearing Restrictions: No General Chart Reviewed: Yes Additional Pertinent History: Covid 3 weeks PTA Family/Caregiver Present: No Vital Signs Pain Pain Assessment Pain Scale: 0-10 Pain Score: 0-No pain Pain Interference Pain Interference Pain Effect on Sleep: 2. Occasionally Pain Interference with Therapy Activities: 1. Rarely or not at all Pain Interference with Day-to-Day Activities: 1. Rarely or not at all Home Living/Prior [place] Available Help at Discharge: Family;Friend(s);Available PRN/intermittently Type of Home: House (town house) Home Access: Stairs to enter [employer] of Steps: 1 Home Layout: One level;Two level Alternate Level Stairs-Number of Steps: flight Bathroom Shower/Tub: Research officer, trade union Accessibility: Yes Additional Comments: Float CMA through [place] With: Alone Prior Function Level of Independence: Independent with basic ADLs  Able to Take Stairs?: Yes Driving: Yes Vocation: Full time employment Vision/Perception  Vision - History Ability to See in Adequate Light: 1 Impaired Vision - Assessment Eye Alignment: Within Functional Limits Ocular Range of Motion: Impaired-to  be further tested in functional context (increased time to achieve full range) Alignment/Gaze Preference: Within Defined Limits Tracking/Visual Pursuits: Unable to hold eye position out of midline;Impaired - to be further tested in functional context (able to attain full Range but difficulty holding at full range) Convergence: Impaired (comment) Diplopia Assessment: Disappears with one eye closed;Objects split side to side;Present all the time/all directions Perception Perception: Within Functional Limits Praxis Praxis: Intact   Sensation Sensation Light Touch: Impaired Detail Light Touch Impaired Details: Impaired RLE;Impaired RUE Proprioception: Impaired Detail Proprioception Impaired Details: Impaired RUE;Impaired RLE Additional Comments: decreased  appreciation for light touch in the RLE, but able to detect all stimuli Coordination Gross Motor Movements are Fluid and Coordinated: No Fine Motor Movements are Fluid and Coordinated: No Coordination and Movement Description: dysmetric on the RLE Finger Nose Finger Test: mild R deviation and decreased speed. Heel Shin Test: decreased speed, ROM and accuracy on the R side. Motor  Motor Motor: Hemiplegia Motor - Skilled Clinical Observations: sensory loss on the Rside LUE>UE. mild R side hemiplegia   Trunk/Postural Assessment  Cervical Assessment Cervical Assessment: Within Functional Limits Thoracic Assessment Thoracic Assessment: Within Functional Limits Lumbar Assessment Lumbar Assessment: Within Functional Limits Postural Control Postural Control: Deficits on evaluation Protective Responses: impaired in standing  Balance Balance Balance Assessed: Yes Static Sitting Balance Static Sitting - Balance Support: No upper extremity supported Static Sitting - Level of Assistance: 7: Independent Dynamic Sitting Balance Dynamic Sitting - Balance Support: No upper extremity supported Dynamic Sitting - Level of Assistance: 6:  Modified independent (Device/Increase time);5: Stand by assistance Static Standing Balance Static Standing - Balance Support: Right upper extremity supported Static Standing - Level of Assistance: 4: Min assist Dynamic Standing Balance Dynamic Standing - Balance Support: Right upper extremity supported Dynamic Standing - Level of Assistance: 3: Mod assist;4: Min assist Extremity Assessment      RLE Assessment RLE Assessment: Exceptions to [facility] General Strength Comments: grossly 4/5 with delayed activation LLE Assessment LLE Assessment: Within Functional Limits  Care Tool Care Tool Bed Mobility Roll left and right activity   Roll left and right assist level: Supervision/Verbal cueing    Sit to lying activity   Sit to lying assist level: Supervision/Verbal cueing    Lying to sitting on side of bed activity   Lying to sitting on side of bed assist level: the ability to move from lying on the back to sitting on the side of the bed with no back support.: Minimal Assistance - Patient > 75%     Care Tool Transfers Sit to stand transfer   Sit to stand assist level: Minimal Assistance - Patient > 75%    Chair/bed transfer   Chair/bed transfer assist level: Minimal Assistance - Patient > 75%     Physiological scientist transfer assist level: Minimal Assistance - Patient > 75%      Care Tool Locomotion Ambulation   Assist level: Moderate Assistance - Patient 50 - 74% Assistive device: Hand held assist Max distance: 30  Walk 10 feet activity   Assist level: Moderate Assistance - Patient - 50 - 74% Assistive device: Hand held assist   Walk 50 feet with 2 turns activity   Assist level: Moderate Assistance - Patient - 50 - 74% Assistive device: Hand held assist  Walk 150 feet activity Walk 150 feet activity did not occur: Safety/medical concerns      Walk 10 feet on uneven surfaces activity   Assist level: Moderate Assistance - Patient - 50 - 74%     Stairs   Assist level: Moderate Assistance - Patient - 50 - 74% Stairs assistive device: No device Max number of stairs: 4  Walk up/down 1 step activity   Walk up/down 1 step (curb) assist level: Moderate Assistance - Patient - 50 - 74% Walk up/down 1 step or curb assistive device: No device  Walk up/down 4 steps activity   Walk up/down 4 steps assist level: Moderate Assistance - Patient - 50 - 74% Walk up/down 4 steps assistive  device: No device  Walk up/down 12 steps activity Walk up/down 12 steps activity did not occur: Safety/medical concerns      Pick up small objects from floor   Pick up small object from the floor assist level: Moderate Assistance - Patient 50 - 74%    Wheelchair Is the patient using a wheelchair?: Yes Type of Wheelchair: Manual   Wheelchair assist level: Supervision/Verbal cueing Max wheelchair distance: 150  Wheel 50 feet with 2 turns activity   Assist Level: Supervision/Verbal cueing  Wheel 150 feet activity   Assist Level: Supervision/Verbal cueing    Refer to Care Plan for Long Term Goals  SHORT TERM GOAL WEEK 1 PT Short Term Goal 1 (Week 1): Pt will perform transfers with supervision assist and no AD PT Short Term Goal 2 (Week 1): Pt will ambulate >152f with supervision assist and LRAD PT Short Term Goal 3 (Week 1): Pt eill ascend 12 steps with CGA and 1 rails PT Short Term Goal 4 (Week 1): Pt will perform Berg balance scale  Recommendations for other services: Therapeutic Recreation  Pet therapy, Kitchen group, Stress management, and Outing/community reintegration  Skilled Therapeutic Intervention Mobility Bed Mobility Bed Mobility: Rolling Right;Rolling Left;Sit to Supine;Supine to Sit Rolling Right: Supervision/verbal cueing Rolling Left: Supervision/Verbal cueing Supine to Sit: Supervision/Verbal cueing Sit to Supine: Minimal Assistance - Patient > 75% Transfers Transfers: Sit to Stand;Stand Pivot Transfers Sit to Stand: Minimal  Assistance - Patient > 75% Stand Pivot Transfers: Minimal Assistance - Patient > 75% Transfer (Assistive device): None Locomotion  Gait Ambulation: Yes Gait Assistance: Moderate Assistance - Patient 50-74% Gait Distance (Feet): 20 Feet Assistive device: None Gait Assistance Details: Verbal cues for technique;Verbal cues for precautions/safety;Verbal cues for safe use of DME/AE;Manual facilitation for weight shifting;Manual facilitation for placement Gait Assistance Details: Min assist x 1065fwith RW Gait Gait: Yes Gait Pattern: Impaired Gait Pattern: Trendelenburg;Narrow base of support Stairs / Additional Locomotion Stairs: Yes Stairs Assistance: Moderate Assistance - Patient 50 - 74% Stair Management Technique: No rails Number of Stairs: 4 Height of Stairs: 6 Wheelchair Mobility Wheelchair Mobility: Yes Wheelchair Assistance: SuChartered loss adjusterBoth upper extremities Wheelchair Parts Management: Needs assistance Distance: 150  Pt received supine in bed and agreeable to PT. Supine>sit transfer with supervision assist and cues for positioning og the RLE. Sit<>stand from EOB to don pants with min assist and LUE supported on bed rail. Min assist for management of pants over feet. PT instructed patient in PT Evaluation and initiated treatment intervention; see above for results. PT educated patient in POMenardrehab potential, rehab goals, and discharge recommendations along with recommendation for follow-up rehabilitation services.   Gait training without AD x 2027fith mod assist due to R hip and trunk instability. Gait training with RW x 100f83fth min assidst for safety with cues for AD management in turns and step height on the RLE.   Stair training as listed above. Car transfre training with no AD and mod assist for control of RLE into car. Pt performed gait training over unlevel surface with mod assist and 1 UE supported on rail for ascent/descent of  ramp.   WC mobility x [id number] supervision assist as listed below. Patient returned to room and performed stand pivot to recliner with RW and min assist for safety. Pt left sitting in recliner with call bell in reach and all needs met.      Discharge Criteria: Patient will be discharged from PT if patient refuses treatment  3 consecutive times without medical reason, if treatment goals not met, if there is a change in medical status, if patient makes no progress towards goals or if patient is discharged from hospital.  The above assessment, treatment plan, treatment alternatives and goals were discussed and mutually agreed upon: by patient  Lorie Phenix 10/09/2022, 11:17 AM

## 2022-10-09 NOTE — Progress Notes (Signed)
Inpatient Rehabilitation Care Coordinator Assessment and Plan Patient Details  Name: Donna Mccarthy MRN: [id number] Date of Birth: 02-08-1976  Today's Date: 10/09/2022  Hospital Problems: Principal Problem:   Stroke (cerebrum) [facility])  Past Medical History:  Past Medical History:  Diagnosis Date   Allergy    Anemia    Anxiety    Arthritis    arthritis knees   Blood transfusion without reported diagnosis    GERD (gastroesophageal reflux disease)    Hypothyroidism    Neuromuscular disorder ([place])    Obesity (BMI 30-39.9) 01/05/2019   Past Surgical History:  Past Surgical History:  Procedure Laterality Date   BIOPSY  04/03/2020   Procedure: BIOPSY;  Surgeon: Harvel Quale, MD;  [facility];  Service: Gastroenterology;;  antral, body, distal esophagus, mid esophagus   BRACHIOPLASTY Bilateral 07/02/2020   Procedure: BILATERAL BRACHIOPLASTY WITH LIPOSUCTION;  Surgeon: [name], MD;  Location: [place];  Service: Plastics;  Laterality: Bilateral;   DILATION AND CURETTAGE OF UTERUS  2022   ESOPHAGOGASTRODUODENOSCOPY (EGD) WITH PROPOFOL N/A 04/03/2020   Procedure: ESOPHAGOGASTRODUODENOSCOPY (EGD) WITH PROPOFOL;  Surgeon: Harvel Quale, MD;  [facility];  Service: Gastroenterology;  Laterality: N/A;  11:15   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   LIPOSUCTION Bilateral 07/02/2020   Procedure: LIPOSUCTION;  Surgeon: [name], MD;  Location: [place];  Service: Plastics;  Laterality: Bilateral;   PANNICULECTOMY N/A 08/04/2019   Procedure: PANNICULECTOMY;  Surgeon: [name], MD;  Location: [place];  Service: Plastics;  Laterality: N/A;  2 hours only, please   TEE WITHOUT CARDIOVERSION N/A 10/07/2022   Procedure: TRANSESOPHAGEAL ECHOCARDIOGRAM (TEE);  Surgeon: Freada Bergeron, MD;  Location: [facility] ENDOSCOPY;  Service: Cardiovascular;  Laterality: N/A;   Social  History:  reports that she has never smoked. She has never used smokeless tobacco. She reports that she does not drink alcohol and does not use drugs.  Family / Support Systems Marital Status: Single Spouse/Significant Other: Ex-boyfriend Children: 1- 64 Y.O Anticipated Caregiver: Kennyth Lose (Cousin) and Verdene Lennert (Friend) Ability/Limitations of Caregiver: Verdene Lennert primary, intermittent assistance from family throughout day Caregiver Availability: 24/7 Family Dynamics: Support from family and friends  Social History Preferred language: English Religion: Christian Cultural Background: Patient working, independent with son in college Education: Medical sales representative - How often do you need to have someone help you when you read instructions, pamphlets, or other written material from your doctor or pharmacy?: Never Writes: Yes Employment Status: Employed Name of Employer: [employer] Return to Work Plans: TBD Public relations account executive Issues: N/A Guardian/Conservator: Dahlia Bailiff (Son)   Abuse/Neglect Abuse/Neglect Assessment Can Be Completed: Yes Physical Abuse: Denies Verbal Abuse: Denies Sexual Abuse: Denies Exploitation of patient/patient's resources: Denies Self-Neglect: Denies  Patient response to: Social Isolation - How often do you feel lonely or isolated from those around you?: Never  Emotional Status Pt's affect, behavior and adjustment status: Pleasant Recent Psychosocial Issues: Coping Psychiatric History: N/A Substance Abuse History: N/A  Patient / Family Perceptions, Expectations & Goals Pt/Family understanding of illness & functional limitations: yes Premorbid pt/family roles/activities: independent and working Anticipated changes in roles/activities/participation: Patient anticpaties discharging home with continous intermittent assistance from family and friends Pt/family expectations/goals: sup/MOD  I goals  Recruitment consultant:  None Premorbid Home Care/DME Agencies: None Transportation available at discharge: [place], son or friend able to transport Is the patient able to respond to transportation needs?: Yes In the  past 12 months, has lack of transportation kept you from medical appointments or from getting medications?: No In the past 12 months, has lack of transportation kept you from meetings, work, or from getting things needed for daily living?: No Resource referrals recommended: Neuropsychology  Discharge Planning Living Arrangements: Alone Support Systems: Other relatives, Friends/neighbors Type of Residence: Private residence (1 steps to enter. Family moving bed to main level & 1/2 bathroom) Insurance Resources: Multimedia programmer (specify) Financial Resources: Employment Financial Screen Referred: No Living Expenses: Education officer, community Management: Patient Does the patient have any problems obtaining your medications?: No Home Management: Independent Patient/Family Preliminary Plans: Antipates to continue to manage Care Coordinator Barriers to Discharge: Insurance for SNF coverage, Decreased caregiver support, Lack of/limited family support Care Coordinator Anticipated Follow Up Needs: HH/OP Expected length of stay: 11-15 Days  Clinical Impression Sw met with patient, introduced self and explained role. Patient previously independent and working as a Technical brewer for [employer]. Patient has a son who is college and able to assist intermittently. Patient's cousin will be assisting primarily also able to provide intermittent assistance. Cousin arranging family members to check-in on patient intermittently daily.  Patient lives in a 2 level town home, ex boyfriend moving bed to the main level and 1/2 bathroom available on this level. 1 step to enter. No additional questions or concerns.  Dyanne Iha 10/09/2022, 12:54 PM

## 2022-10-09 NOTE — Progress Notes (Signed)
Patient ID: Donna Mccarthy, female   DOB: 24-Mar-1976, 47 y.o.   MRN: [id number] Met with the patient to review current situation, rehab process, team conference and plan of care. Discussed secondary risk management including migraines, thyroid issues and recent COVID + with medications, DAPT x 3 wks then ASA solo and dietary modifications (HH diet). Patient is vegan and has various food preferences. Reviewed Vit D deficiency and potassium food sources.  Continue to follow along to address educational needs to facilitate preparation for discharge. Margarito Liner

## 2022-10-09 NOTE — Progress Notes (Signed)
[facility] Individual Statement of Services  Patient Name:  Donna Mccarthy  Date:  10/09/2022  Welcome to the [facility].  Our goal is to provide you with an individualized program based on your diagnosis and situation, designed to meet your specific needs.  With this comprehensive rehabilitation program, you will be expected to participate in at least 3 hours of rehabilitation therapies Monday-Friday, with modified therapy programming on the weekends.  Your rehabilitation program will include the following services:  Physical Therapy (PT), Occupational Therapy (OT), Speech Therapy (ST), 24 hour per day rehabilitation nursing, Therapeutic Recreaction (TR), Neuropsychology, Care Coordinator, Rehabilitation Medicine, Nutrition Services, Pharmacy Services, and Other  Weekly team conferences will be held on Wednesdays to discuss your progress.  Your Inpatient Rehabilitation Care Coordinator will talk with you frequently to get your input and to update you on team discussions.  Team conferences with you and your family in attendance may also be held.  Expected length of stay: 11-15 Days  Overall anticipated outcome: supervision to mod I   Depending on your progress and recovery, your program may change. Your Inpatient Rehabilitation Care Coordinator will coordinate services and will keep you informed of any changes. Your Inpatient Rehabilitation Care Coordinator's name and contact numbers are listed  below.  The following services may also be recommended but are not provided by the [facility]:   [place] will be made to provide these services after discharge if needed.  Arrangements include referral to agencies that provide these services.  Your insurance has been verified to be:  [employer] Your primary doctor is:  Rita Ohara  Pertinent  information will be shared with your doctor and your insurance company.  Inpatient Rehabilitation Care Coordinator:  Erlene Quan, [place] or [phone]  Information discussed with and copy given to patient by: Dyanne Iha, 10/09/2022, 9:29 AM

## 2022-10-09 NOTE — Progress Notes (Signed)
PROGRESS NOTE   Subjective/Complaints:  No new issues wears tape on RIght eyeglass lens DDiscussed work duties,  Discussed + opiate on UDS (pt states she is not on opiates for pain)  ROS- neg CP, SOB, N/V/D  Objective:   VAS [place] TRANSCRANIAL DOPPLER W BUBBLES  Result Date: 10/08/2022  Transcranial Doppler with Bubble Patient Name:  Donna Mccarthy  Date of Exam:   10/08/2022 Medical Rec #: [id number]           Accession #:    [id number] Date of Birth: 05-27-1976            Patient Gender: F Patient Age:   47 years Exam Location:  [facility] Procedure:      VAS [place] TRANSCRANIAL DOPPLER W BUBBLES Referring Phys: Cornelius Moras XU --------------------------------------------------------------------------------  Indications: Stroke. Comparison Study: Echo TEE on 10-07-2022 Performing Technologist: Darlin Coco RDMS, RVT  Examination Guidelines: A complete evaluation includes B-mode imaging, spectral Doppler, color Doppler, and power Doppler as needed of all accessible portions of each vessel. Bilateral testing is considered an integral part of a complete examination. Limited examinations for reoccurring indications may be performed as noted.  Summary:  A vascular evaluation was performed. The left middle cerebral artery was studied. An IV was inserted into the patient's right wrist. Verbal informed consent was obtained.  No HITS at rest or during Valsalva. Negative transcranial Doppler Bubble study with no evidence of right to left intracardiac or intrapulmonary communication.  *See table(s) above for TCD measurements and observations.  Diagnosing physician: Rosalin Hawking MD Electronically signed by Rosalin Hawking MD on 10/08/2022 at 2:44:22 PM.    Final    ECHO TEE  Result Date: 10/07/2022    TRANSESOPHOGEAL ECHO REPORT   Patient Name:   Donna Mccarthy Date of Exam: 10/07/2022 Medical Rec #:  [id number]          Height:       65.0 in Accession #:     [id number]         Weight:       181.4 lb Date of Birth:  10-21-1975           BSA:          1.898 m Patient Age:    47 years           BP:           100/83 mmHg Patient Gender: F                  HR:           94 bpm. Exam Location:  Inpatient Procedure: Transesophageal Echo, Color Doppler, Cardiac Doppler and Saline            Contrast Bubble Study Indications:     stroke  History:         Patient has prior history of Echocardiogram examinations, most                  recent 10/04/2022.  Sonographer:     Johny Chess RDCS Referring Phys:  [id number] Tami Lin [facility] Diagnosing Phys: Gwyndolyn Kaufman MD PROCEDURE: After discussion of the risks and  benefits of a TEE, an informed consent was obtained from the patient. The transesophogeal probe was passed without difficulty through the esophogus of the patient. Imaged were obtained with the patient in a left lateral decubitus position. Sedation performed by different physician. The patient was monitored while under deep sedation. Anesthestetic sedation was provided intravenously by Anesthesiology: 78m of Propofol. The patient developed no complications during the procedure.  IMPRESSIONS  1. Left ventricular ejection fraction, by estimation, is 60 to 65%. The left ventricle has normal function.  2. Right ventricular systolic function is normal. The right ventricular size is normal.  3. No left atrial/left atrial appendage thrombus was detected.  4. The mitral valve is normal in structure. Trivial mitral valve regurgitation.  5. The aortic valve is tricuspid. Aortic valve regurgitation is not visualized. No aortic stenosis is present.  6. The atrial septum is redundant. No interatrial shunting detected by color doppler. Multiple agitated saline contrast bubble studies performed. The majority of bubbles were seen late suggestive of intrapulmonary shunting. There were a small number of bubbles seen early during the study suggestive of a small PFO. Recommend  transcranial doppler for further evaluation. FINDINGS  Left Ventricle: Left ventricular ejection fraction, by estimation, is 60 to 65%. The left ventricle has normal function. The left ventricular internal cavity size was normal in size. Right Ventricle: The right ventricular size is normal. No increase in right ventricular wall thickness. Right ventricular systolic function is normal. Left Atrium: Left atrial size was normal in size. No left atrial/left atrial appendage thrombus was detected. Right Atrium: Right atrial size was normal in size. Pericardium: There is no evidence of pericardial effusion. Mitral Valve: The mitral valve is normal in structure. Trivial mitral valve regurgitation. Tricuspid Valve: The tricuspid valve is normal in structure. Tricuspid valve regurgitation is trivial. No evidence of tricuspid stenosis. Aortic Valve: The aortic valve is tricuspid. Aortic valve regurgitation is not visualized. No aortic stenosis is present. Pulmonic Valve: The pulmonic valve was normal in structure. Pulmonic valve regurgitation is not visualized. Aorta: The aortic root is normal in size and structure. IAS/Shunts: The atrial septum is redundant. No interatrial shunting detected by color doppler. Multiple agitated saline contrast bubble studies performed. The majority of bubbles were seen late suggestive of intrapulmonary shunting. There were a small number of bubbles seen early during the study suggestive of a small PFO. Recommend transcranial doppler for further evaluation. Agitated saline contrast was given intravenously to evaluate for intracardiac shunting. HGwyndolyn KaufmanMD Electronically signed by HGwyndolyn KaufmanMD Signature Date/Time: 10/07/2022/4:25:23 PM    Final    Recent Labs    10/08/22 0817 10/09/22 0644  WBC 3.8* 3.2*  HGB 11.4* 10.9*  HCT 36.6 33.7*  PLT 171 153   Recent Labs    10/08/22 0817 10/09/22 0644  NA 137 138  K 3.8 3.9  CL 107 109  CO2 22 21*  GLUCOSE 123* 98   BUN 7 7  CREATININE 0.66 0.64  CALCIUM 9.1 9.1    Intake/Output Summary (Last 24 hours) at 10/09/2022 0843 Last data filed at 10/09/2022 0700 Gross per 24 hour  Intake 300 ml  Output --  Net 300 ml        Physical Exam: Vital Signs Blood pressure (!) 100/56, pulse 76, temperature 98.4 F (36.9 C), resp. rate 16, height 5' 5"$  (1.651 m), weight 82.6 kg, last menstrual period 09/22/2022, SpO2 100 %.   General: No acute distress Mood and affect are appropriate Heart: Regular rate and  rhythm no rubs murmurs or extra sounds Lungs: Clear to auscultation, breathing unlabored, no rales or wheezes Abdomen: Positive bowel sounds, soft nontender to palpation, nondistended Extremities: No clubbing, cyanosis, or edema Skin: No evidence of breakdown, no evidence of rash Neurologic: Cranial nerves II through XII intact, motor strength is 5/5 in Lef tand 4/5 right  deltoid, bicep, tricep, grip, 5/5 bilateral hip flexor, knee extensors, ankle dorsiflexor and plantar flexor Sensory exam normal sensation to light touch and proprioception in bilateral upper and lower extremities Cerebellar exam normal finger to nose to finger as well as heel to shin in bilateral upper and lower extremities Musculoskeletal: Full range of motion in all 4 extremities. No joint swelling Neuro:  Eyes without evidence of nystagmus  Tone is normal without evidence of spasticity   Cranial nerves II- Visual fields are intact to confrontation testing, no blurring of vision III- no evidence of ptosis, upward, downward and medial gaze intact IV- no vertical diplopia or head tilt V- no facial numbness or masseter weakness VI- no pupil abduction weakness although pt c/o mild diplopia on lateral gaze bilaterally VII- LEFT facial droop, nl bilateral lid closure VII- normal auditory acuity IX- no pharygeal weakness, X- no pharyngeal weakness, no hoarseness XI- no trap or SCM weakness XII- no glossal weakness     Assessment/Plan: 1. Functional deficits which require 3+ hours per day of interdisciplinary therapy in a comprehensive inpatient rehab setting. Physiatrist is providing close team supervision and 24 hour management of active medical problems listed below. Physiatrist and rehab team continue to assess barriers to discharge/monitor patient progress toward functional and medical goals  Care Tool:  Bathing              Bathing assist       Upper Body Dressing/Undressing Upper body dressing        Upper body assist      Lower Body Dressing/Undressing Lower body dressing            Lower body assist       Toileting Toileting    Toileting assist       Transfers Chair/bed transfer  Transfers assist           Locomotion Ambulation   Ambulation assist              Walk 10 feet activity   Assist           Walk 50 feet activity   Assist           Walk 150 feet activity   Assist           Walk 10 feet on uneven surface  activity   Assist           Wheelchair     Assist               Wheelchair 50 feet with 2 turns activity    Assist            Wheelchair 150 feet activity     Assist          Blood pressure (!) 100/56, pulse 76, temperature 98.4 F (36.9 C), resp. rate 16, height 5' 5"$  (1.651 m), weight 82.6 kg, last menstrual period 09/22/2022, SpO2 100 %.  Medical Problem List and Plan: 1. 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  Antithrombotics: -DVT/anticoagulation:  Pharmaceutical: Lovenox             -antiplatelet therapy: DAPT for 3 weeks followed by aspirin alone 3. Pain Management: Fioricet prn for HA 4.  Mood/Behavior/Sleep: LCSW to follow for evaluation and support.              --trazodone prn for chronic insomnia (melatonin not very effective)             -antipsychotic agents: N/A 5. Neuropsych/cognition: This patient is capable of making decisions on her own behalf. 6. Skin/Wound Care:  Routine pressure relief measures 7. Fluids/Electrolytes/Nutrition:  Monitor I/O. Check CMET in am 8. Hypotension: SBP in 90's but asmptomatic?,  Monitor for orthostatic symptoms --Encourage fluid intake 9. Migraines: Has been managed with use of Topamax and Imitrex twice a month on average.  10.Transient hypokalemia: Resolved w/ supplement.      Latest Ref Rng & Units 10/09/2022    6:44 AM 10/08/2022    8:17 AM 10/07/2022    3:36 AM  BMP  Glucose 70 - 99 mg/dL 98  123  95   BUN 6 - 20 mg/dL 7  7  7   $ Creatinine 0.44 - 1.00 mg/dL 0.64  0.66  0.53   Sodium 135 - 145 mmol/L 138  137  139   Potassium 3.5 - 5.1 mmol/L 3.9  3.8  3.7   Chloride 98 - 111 mmol/L 109  107  109   CO2 22 - 32 mmol/L 21  22  21   $ Calcium 8.9 - 10.3 mg/dL 9.1  9.1  9.0     11. Low vitamin D level: Ergocalciferol-21.7 on 08/25/22             --resume supplement.   12. Neutropenia: mild stable - cont to monitor weekly     Latest Ref Rng & Units 10/09/2022    6:44 AM 10/08/2022    8:17 AM 10/07/2022    3:36 AM  CBC  WBC 4.0 - 10.5 K/uL 3.2  3.8  3.7   Hemoglobin 12.0 - 15.0 g/dL 10.9  11.4  11.1   Hematocrit 36.0 - 46.0 % 33.7  36.6  35.3   Platelets 150 - 400 K/uL 153  171  166     13. Intermittent constipation: No issues so far. Used colace 2x /wk prn at home.  14.  Hyperlipidemia.  Continue Lipitor 30m    LOS: 1 days A FACE TO FACE EVALUATION WAS PERFORMED  ACharlett Blake2/22/2024, 8:43 AM

## 2022-10-09 NOTE — Progress Notes (Signed)
Inpatient Rehabilitation  Patient information reviewed and entered into eRehab system by Marlo Arriola M. Chyrel Taha, M.A., CCC/SLP, PPS Coordinator.  Information including medical coding, functional ability and quality indicators will be reviewed and updated through discharge.    

## 2022-10-09 NOTE — Telephone Encounter (Signed)
Patient just spoke with her social worker and she said that the doctor at the hospital/rehab could do the forms for her. She will let us know if she needs anything and she said thank you so much!

## 2022-10-09 NOTE — Progress Notes (Signed)
Occupational Therapy Assessment and Plan  Patient Details  Name: Donna Mccarthy MRN: [id number] Date of Birth: 02-07-1976  OT Diagnosis: disturbance of vision, hemiplegia affecting dominant side, muscle weakness (generalized), and balance deficits Rehab Potential: Rehab Potential (ACUTE ONLY): Excellent ELOS: 10-12 days   Today's Date: 10/09/2022 OT Individual Time: 1315-1430 OT Individual Time Calculation (min): 75 min     Hospital Problem: Principal Problem:   Stroke (cerebrum) ([place])   Past Medical History:  Past Medical History:  Diagnosis Date   Allergy    Anemia    Anxiety    Arthritis    arthritis knees   Blood transfusion without reported diagnosis    GERD (gastroesophageal reflux disease)    Hypothyroidism    Neuromuscular disorder ([place])    Obesity (BMI 30-39.9) 01/05/2019   Past Surgical History:  Past Surgical History:  Procedure Laterality Date   BIOPSY  04/03/2020   Procedure: BIOPSY;  Surgeon: Harvel Quale, MD;  [facility];  Service: Gastroenterology;;  antral, body, distal esophagus, mid esophagus   BRACHIOPLASTY Bilateral 07/02/2020   Procedure: BILATERAL BRACHIOPLASTY WITH LIPOSUCTION;  Surgeon: [name], MD;  Location: [place];  Service: Plastics;  Laterality: Bilateral;   DILATION AND CURETTAGE OF UTERUS  2022   ESOPHAGOGASTRODUODENOSCOPY (EGD) WITH PROPOFOL N/A 04/03/2020   Procedure: ESOPHAGOGASTRODUODENOSCOPY (EGD) WITH PROPOFOL;  Surgeon: Harvel Quale, MD;  [facility];  Service: Gastroenterology;  Laterality: N/A;  11:15   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   LIPOSUCTION Bilateral 07/02/2020   Procedure: LIPOSUCTION;  Surgeon: [name], MD;  Location: [place];  Service: Plastics;  Laterality: Bilateral;   PANNICULECTOMY N/A 08/04/2019   Procedure: PANNICULECTOMY;  Surgeon: [name], MD;  Location: [place];   Service: Plastics;  Laterality: N/A;  2 hours only, please   TEE WITHOUT CARDIOVERSION N/A 10/07/2022   Procedure: TRANSESOPHAGEAL ECHOCARDIOGRAM (TEE);  Surgeon: Freada Bergeron, MD;  Location: [facility];  Service: Cardiovascular;  Laterality: N/A;    Assessment & Plan Clinical Impression: Patient is a 47 y.o. year old female with recent admission to the hospital on 10/03/22 with left facial droop, blurred vision and slurred speech that started while she was driving. She was found to have severe dysarthria with left facial droop that was variable and severely and b/l CN VI incomplete palsy.   CTA without LVO and  and she received TNK.  MRI/MRA brain done and negative for stroke, stenosis or acute process. 2D echo showed EF 60-65% with no wall abnormalities. She had dysconjugate gaze with diplopia that Dr. Erlinda Hong felt was more consistent with possible brainstem infarct secondary to small vessel disease and not seen on MRI. She was noted to have right sided weakness with sensory deficits. Neurology recommended DAPT X 3 weeks followed by ASA alone.     Cardiology consulted and TEE done 02/20 revealing EF 60-65% with no wall abnormality but showed possible intrapulmonary shunt and small PFO --TCD recommended for further evaluation. TCD done today and negative for right to left shunt.  PT/OT has been working with patient who continues to be limited by double vision, right sided weakness with decreased in motor control and right side    Patient transferred to CIR on 10/08/2022 .    Patient currently requires min with basic self-care skills secondary to muscle weakness, decreased cardiorespiratoy endurance, unbalanced muscle activation and decreased coordination, decreased visual perceptual skills and decreased visual  motor skills, decreased attention to right, and decreased standing balance, decreased postural control, hemiplegia, and decreased balance strategies.  Prior to hospitalization, patient could  complete BADL with independent .  Patient will benefit from skilled intervention to increase independence with basic self-care skills prior to discharge home with care partner.  Anticipate patient will require intermittent supervision and follow up outpatient.  OT - End of Session Endurance Deficit: Yes Endurance Deficit Description: rest  breaks within BADL tasks OT Assessment Rehab Potential (ACUTE ONLY): Excellent OT Patient demonstrates impairments in the following area(s): Balance;Cognition;Endurance;Motor;Nutrition;Perception;Safety;Sensory;Skin Integrity;Vision OT Basic ADL's Functional Problem(s): Eating;Grooming;Bathing;Dressing;Toileting OT Advanced ADL's Functional Problem(s): Simple Meal Preparation;Light Housekeeping OT Transfers Functional Problem(s): Tub/Shower;Toilet OT Additional Impairment(s): Fuctional Use of Upper Extremity OT Plan OT Intensity: Minimum of 1-2 x/day, 45 to 90 minutes OT Frequency: 5 out of 7 days OT Duration/Estimated Length of Stay: 10-12 days OT Treatment/Interventions: Balance/vestibular training;Cognitive remediation/compensation;Community reintegration;Discharge planning;Disease mangement/prevention;DME/adaptive equipment instruction;Functional electrical stimulation;Functional mobility training;Neuromuscular re-education;Pain management;Patient/family education;Psychosocial support;Self Care/advanced ADL retraining;Skin care/wound managment;Splinting/orthotics;Therapeutic Activities;Therapeutic Exercise;UE/LE Strength taining/ROM;UE/LE Coordination activities;Visual/perceptual remediation/compensation;Wheelchair propulsion/positioning OT Self Feeding Anticipated Outcome(s): Mod I OT Basic Self-Care Anticipated Outcome(s): Mod I OT Toileting Anticipated Outcome(s): Mod I OT Bathroom Transfers Anticipated Outcome(s): Mod I OT Recommendation Recommendations for Other Services: Therapeutic Recreation consult Patient destination: Home Follow Up  Recommendations: Outpatient OT Equipment Recommended: To be determined   OT Evaluation Precautions/Restrictions  Precautions Precautions: Fall Precaution Comments: double vision Restrictions Weight Bearing Restrictions: No General   Vital Signs  Pain   Home Living/Prior Functioning Home Living Family/patient expects to be discharged to:: Private residence Living Arrangements: Alone Available Help at Discharge: Family, Friend(s), Available PRN/intermittently Type of Home: House (townhouse) Home Access: Stairs to enter Technical brewer of Steps: 1 Home Layout: One level, Two level Alternate Level Stairs-Number of Steps: flight Bathroom Shower/Tub: Government social research officer Accessibility: Yes Additional Comments: Has a 1/2 bath downstairs  Lives With: Alone IADL History Homemaking Responsibilities: Yes Education: college Occupation: Full time employment Type of Occupation: Librarian, academic CMA through [employer]: Enjoys walking, going to the gym, spending time with friends Prior Function Level of Independence: Independent with basic ADLs, Independent with homemaking with ambulation  Able to Take Stairs?: Yes Driving: Yes Vocation: Full time employment Vision Baseline Vision/History: 1 Wears glasses Ability to See in Adequate Light: 1 Impaired Patient Visual Report: Diplopia Vision Assessment?: Yes;Vision impaired- to be further tested in functional context Eye Alignment: Within Functional Limits Ocular Range of Motion: Impaired-to be further tested in functional context Perception    Praxis Praxis: Intact Cognition Cognition Overall Cognitive Status: Impaired/Different from baseline Arousal/Alertness: Awake/alert Orientation Level: Person;Place;Situation Person: Oriented Place: Oriented Situation: Oriented Memory: Impaired Memory Impairment: Decreased recall of new information;Decreased short term memory Attention:  Focused;Sustained;Selective Focused Attention: Appears intact Sustained Attention: Appears intact Selective Attention: Appears intact Awareness: Appears intact Safety/Judgment: Appears intact Brief Interview for Mental Status (BIMS) Repetition of Three Words (First Attempt): 3 Temporal Orientation: Year: Correct Temporal Orientation: Month: Accurate within 5 days Temporal Orientation: Day: Correct Recall: "Sock": Yes, after cueing ("something to wear") Recall: "Blue": Yes, no cue required Recall: "Bed": Yes, no cue required BIMS Summary Score: 14 Sensation Sensation Light Touch: Impaired Detail Light Touch Impaired Details: Impaired RLE;Impaired RUE Proprioception: Impaired Detail Proprioception Impaired Details: Impaired RUE;Impaired RLE Additional Comments: decreased appreciation for light touch in the RLE, but able to detect all stimuli Coordination Gross Motor Movements are Fluid and Coordinated: No Coordination and Movement Description: decreased smoothness and accuracy with  R Finger Nose Finger Test: mild dysmetria on R sde 9 Hole Peg Test: 40L, 48 R Motor  Motor Motor: Hemiplegia Motor - Skilled Clinical Observations: sensory loss on the Rside LUE>UE. mild R side hemiplegia  Trunk/Postural Assessment  Postural Control Postural Control: Deficits on evaluation Protective Responses: impaired in standing  Balance Balance Balance Assessed: Yes Static Sitting Balance Static Sitting - Balance Support: No upper extremity supported Static Sitting - Level of Assistance: 7: Independent Dynamic Sitting Balance Dynamic Sitting - Balance Support: No upper extremity supported Dynamic Sitting - Level of Assistance: 6: Modified independent (Device/Increase time);5: Stand by assistance Static Standing Balance Static Standing - Balance Support: Right upper extremity supported Static Standing - Level of Assistance: 4: Min assist Dynamic Standing Balance Dynamic Standing - Balance  Support: Right upper extremity supported Dynamic Standing - Level of Assistance: 3: Mod assist;4: Min assist Extremity/Trunk Assessment RUE Assessment RUE Assessment: Exceptions to [facility] General Strength Comments: 4/5 on R UE LUE Assessment LUE Assessment: Within Functional Limits  Care Tool Care Tool Self Care Eating   Eating Assist Level: Set up assist    Oral Care    Oral Care Assist Level: Supervision/Verbal cueing    Bathing   Body parts bathed by patient: Right arm;Left arm;Chest;Front perineal area;Abdomen;Buttocks;Left upper leg;Right upper leg;Right lower leg;Face;Left lower leg     Assist Level: Contact Guard/Touching assist    Upper Body Dressing(including orthotics)   What is the patient wearing?: Pull over shirt   Assist Level: Supervision/Verbal cueing    Lower Body Dressing (excluding footwear)   What is the patient wearing?: Underwear/pull up;Pants Assist for lower body dressing: Minimal Assistance - Patient > 75%    Putting on/Taking off footwear   What is the patient wearing?: Socks;Shoes Assist for footwear: Minimal Assistance - Patient > 75%       Care Tool Toileting Toileting activity   Assist for toileting: Minimal Assistance - Patient > 75%     Care Tool Bed Mobility Roll left and right activity   Roll left and right assist level: Supervision/Verbal cueing    Sit to lying activity   Sit to lying assist level: Supervision/Verbal cueing    Lying to sitting on side of bed activity   Lying to sitting on side of bed assist level: the ability to move from lying on the back to sitting on the side of the bed with no back support.: Minimal Assistance - Patient > 75%     Care Tool Transfers Sit to stand transfer   Sit to stand assist level: Minimal Assistance - Patient > 75%    Chair/bed transfer   Chair/bed transfer assist level: Minimal Assistance - Patient > 75%     Toilet transfer   Assist Level: Minimal Assistance - Patient > 75%      Care Tool Cognition  Expression of Ideas and Wants Expression of Ideas and Wants: 3. Some difficulty - exhibits some difficulty with expressing needs and ideas (e.g, some words or finishing thoughts) or speech is not clear  Understanding Verbal and Non-Verbal Content Understanding Verbal and Non-Verbal Content: 3. Usually understands - understands most conversations, but misses some part/intent of message. Requires cues at times to understand   Memory/Recall Ability Memory/Recall Ability : Current season;That he or she is in a hospital/hospital unit   Refer to Care Plan for [place] 1 OT Short Term Goal 1 (Week 1): Patient will ambulate with device and min A to transition  into bathroom. OT Short Term Goal 2 (Week 1): Patient will maintain dynamic standing balance within functional BADL task with CGA OT Short Term Goal 3 (Week 1): Patient will don shoes with set-set-up A.  Recommendations for other services: Surveyor, mining group, Stress management, and Outing/community reintegration   Skilled Therapeutic Intervention OT eval completed addressing rehab process, OT purpose, POC, ELOS, and goals.  Pt ambulated with RW and min A for bathroom transfers, overall Min A for BADL tasks. Pt with mild word finding deficits and some R inattention but overall doing very well. OT took away the RW and she then needed mod A for functional ambulation and dynamic balance due to R LE proprioceptive deficits.  Pt left semi-reclined in bed with alarm on, call bell in reach, and needs met. See below for further details regarding BADL performance.   ADL ADL Eating: Set up Grooming: Setup Upper Body Bathing: Supervision/safety Lower Body Bathing: Contact guard Upper Body Dressing: Supervision/safety Lower Body Dressing: Minimal assistance Toileting: Minimal assistance Toilet Transfer: Minimal assistance Walk-In Shower Transfer: Minimal assistance Mobility  Bed  Mobility Bed Mobility: Rolling Right;Rolling Left;Sit to Supine;Supine to Sit Rolling Right: Supervision/verbal cueing Rolling Left: Supervision/Verbal cueing Supine to Sit: Supervision/Verbal cueing Sit to Supine: Minimal Assistance - Patient > 75% Transfers Sit to Stand: Minimal Assistance - Patient > 75%   Discharge Criteria: Patient will be discharged from OT if patient refuses treatment 3 consecutive times without medical reason, if treatment goals not met, if there is a change in medical status, if patient makes no progress towards goals or if patient is discharged from hospital.  The above assessment, treatment plan, treatment alternatives and goals were discussed and mutually agreed upon: by patient  Valma Cava 10/09/2022, 3:24 PM

## 2022-10-09 NOTE — Plan of Care (Signed)
  Problem: RH Balance Goal: LTG Patient will maintain dynamic sitting balance (PT) Description: LTG:  Patient will maintain dynamic sitting balance with assistance during mobility activities (PT) Flowsheets (Taken 10/09/2022 1621) LTG: Pt will maintain dynamic sitting balance during mobility activities with:: Independent Goal: LTG Patient will maintain dynamic standing balance (PT) Description: LTG:  Patient will maintain dynamic standing balance with assistance during mobility activities (PT) Flowsheets (Taken 10/09/2022 1621) LTG: Pt will maintain dynamic standing balance during mobility activities with:: Independent with assistive device    Problem: RH Bed Mobility Goal: LTG Patient will perform bed mobility with assist (PT) Description: LTG: Patient will perform bed mobility with assistance, with/without cues (PT). Flowsheets (Taken 10/09/2022 1621) LTG: Pt will perform bed mobility with assistance level of: Independent with assistive device    Problem: RH Bed to Chair Transfers Goal: LTG Patient will perform bed/chair transfers w/assist (PT) Description: LTG: Patient will perform bed to chair transfers with assistance (PT). Flowsheets (Taken 10/09/2022 1621) LTG: Pt will perform Bed to Chair Transfers with assistance level: Independent with assistive device    Problem: RH Car Transfers Goal: LTG Patient will perform car transfers with assist (PT) Description: LTG: Patient will perform car transfers with assistance (PT). Flowsheets (Taken 10/09/2022 1621) LTG: Pt will perform car transfers with assist:: Independent with assistive device    Problem: RH Furniture Transfers Goal: LTG Patient will perform furniture transfers w/assist (OT/PT) Description: LTG: Patient will perform furniture transfers  with assistance (OT/PT). Flowsheets (Taken 10/09/2022 1621) LTG: Pt will perform furniture transfers with assist:: Independent with assistive device    Problem: RH Ambulation Goal: LTG  Patient will ambulate in controlled environment (PT) Description: LTG: Patient will ambulate in a controlled environment, # of feet with assistance (PT). Flowsheets (Taken 10/09/2022 1621) LTG: Pt will ambulate in controlled environ  assist needed:: Independent with assistive device LTG: Ambulation distance in controlled environment: 168f with LRAD Goal: LTG Patient will ambulate in home environment (PT) Description: LTG: Patient will ambulate in home environment, # of feet with assistance (PT). Flowsheets (Taken 10/09/2022 1621) LTG: Pt will ambulate in home environ  assist needed:: Independent with assistive device LTG: Ambulation distance in home environment: 514fwith LRAD   Problem: RH Wheelchair Mobility Goal: LTG Patient will propel w/c in controlled environment (PT) Description: LTG: Patient will propel wheelchair in controlled environment, # of feet with assist (PT) Flowsheets (Taken 10/09/2022 1621) LTG: Pt will propel w/c in controlled environ  assist needed:: Independent with assistive device LTG: Propel w/c distance in controlled environment: [id number] Problem: RH Stairs Goal: LTG Patient will ambulate up and down stairs w/assist (PT) Description: LTG: Patient will ambulate up and down # of stairs with assistance (PT) Flowsheets (Taken 10/09/2022 1621) LTG: Pt will ambulate up/down stairs assist needed:: Supervision/Verbal cueing LTG: Pt will  ambulate up and down number of stairs: 12 steps to access bed/bath in home.

## 2022-10-09 NOTE — Telephone Encounter (Signed)
Patient called and wanted to know if you are able to fill out her short term disability forms, she was not sure how this worked. She also mentioned that the hospital is telling her she tested positive for opiates and she is unsure how-could it have been something they gave her when she fist got to hospital or something for a headache? She is confused.

## 2022-10-09 NOTE — Plan of Care (Signed)
  Problem: RH Expression Communication Goal: LTG Patient will increase word finding of common (SLP) Description: LTG:  Patient will increase word finding of common objects/daily info/abstract thoughts with cues using compensatory strategies (SLP). Flowsheets (Taken 10/09/2022 1740) LTG: Patient will increase word finding of common (SLP): Modified Independent Patient will use compensatory strategies to increase word finding of:  Daily info  Abstract thoughts  Common objects   Problem: RH Problem Solving Goal: LTG Patient will demonstrate problem solving for (SLP) Description: LTG:  Patient will demonstrate problem solving for basic/complex daily situations with cues  (SLP) Flowsheets (Taken 10/09/2022 1740) LTG: Patient will demonstrate problem solving for (SLP): Complex daily situations LTG Patient will demonstrate problem solving for: Supervision   Problem: RH Memory Goal: LTG Patient will demonstrate ability for day to day (SLP) Description: LTG:   Patient will demonstrate ability for day to day recall/carryover during cognitive/linguistic activities with assist  (SLP) Flowsheets (Taken 10/09/2022 1740) LTG: Patient will demonstrate ability for day to day recall:  New information  Daily complex information LTG: Patient will demonstrate ability for day to day recall/carryover during cognitive/linguistic activities with assist (SLP): Supervision Goal: LTG Patient will use memory compensatory aids to (SLP) Description: LTG:  Patient will use memory compensatory aids to recall biographical/new, daily complex information with cues (SLP) Flowsheets (Taken 10/09/2022 1740) LTG: Patient will use memory compensatory aids to (SLP): Modified Independent

## 2022-10-10 DIAGNOSIS — I6389 Other cerebral infarction: Secondary | ICD-10-CM | POA: Diagnosis not present

## 2022-10-10 NOTE — Progress Notes (Signed)
Speech Language Pathology Daily Session Note  Patient Details  Name: Donna Mccarthy MRN: [id number] Date of Birth: 1976/03/10  Today's Date: 10/10/2022 SLP Individual Time: 1125-1210 SLP Individual Time Calculation (min): 45 min  Short Term Goals: Week 1: SLP Short Term Goal 1 (Week 1): Patient will utilize memory compensations to recall novel information with sup A verbal cues SLP Short Term Goal 2 (Week 1): Patient will complete problem solving pertaining to iADLs (e.g., medication/money management, with min A verbal cues SLP Short Term Goal 3 (Week 1): Patient will complete complex scheduling and organizing tasks with min A verbal cues SLP Short Term Goal 4 (Week 1): Patient will verbalize recall and utilize word finding strategies during structured and/or unstructured cognitive-linguistic/language tasks with sup A verbal cues  Skilled Therapeutic Interventions: Skilled ST treatment focused on cognitive-communication goals. Pt was greeted in reclining chair on arrival and agreeable to ST intervention. Pt was on the phone with her husband prior to session and mentioned that she had one occasion of word finding difficulty as she searched for the name of a favorite restaurant. She was eventually able to overcome with husband's description of the place. SLP provided education on word finding strategies and provided pt with educational handout. Pt verbalized and demonstrated understanding of "description" strategy through use of semantic features during a verbal description task. Pt described people/places/objects with 100% effectiveness at independent level without evidence of word finding difficulty. Pt reported typically occurs infrequently and does not interfere with her ability to engage in conversations. Communication goal met. SLP then facilitated problem solving and functional money and time management scenarios using "solving daily math problems" subtest with the ALFA. Pt completed with 7/10  correct at ind level, progressing to 10/10 with min A verbal and written cues and extended processing time. Pt responded to feedback very well and remained highly motivated throughout session. Patient was left in recliner with alarm activated and immediate needs within reach at end of session. Continue per current plan of care.      Pain  None/denied  Therapy/Group: Individual Therapy  Patty Sermons 10/10/2022, 3:14 PM

## 2022-10-10 NOTE — Progress Notes (Signed)
PROGRESS NOTE   Subjective/Complaints:  No issues overnite , in PT , good sitting balance , some visual blurring but no double vision   ROS- neg CP, SOB, N/V/D  Objective:   VAS [place] TRANSCRANIAL DOPPLER W BUBBLES  Result Date: 10/08/2022  Transcranial Doppler with Bubble Patient Name:  Donna Mccarthy  Date of Exam:   10/08/2022 Medical Rec #: [id number]           Accession #:    [id number] Date of Birth: [birth year]            Patient Gender: F Patient Age:   47 years Exam Location:  [facility] Procedure:      VAS [place] TRANSCRANIAL DOPPLER W BUBBLES Referring Phys: Cornelius Moras XU --------------------------------------------------------------------------------  Indications: Stroke. Comparison Study: Echo TEE on 10-07-2022 Performing Technologist: Darlin Coco RDMS, RVT  Examination Guidelines: A complete evaluation includes B-mode imaging, spectral Doppler, color Doppler, and power Doppler as needed of all accessible portions of each vessel. Bilateral testing is considered an integral part of a complete examination. Limited examinations for reoccurring indications may be performed as noted.  Summary:  A vascular evaluation was performed. The left middle cerebral artery was studied. An IV was inserted into the patient's right wrist. Verbal informed consent was obtained.  No HITS at rest or during Valsalva. Negative transcranial Doppler Bubble study with no evidence of right to left intracardiac or intrapulmonary communication.  *See table(s) above for TCD measurements and observations.  Diagnosing physician: Rosalin Hawking MD Electronically signed by Rosalin Hawking MD on 10/08/2022 at 2:44:22 PM.    Final    Recent Labs    10/08/22 0817 10/09/22 0644  WBC 3.8* 3.2*  HGB 11.4* 10.9*  HCT 36.6 33.7*  PLT 171 153    Recent Labs    10/08/22 0817 10/09/22 0644  NA 137 138  K 3.8 3.9  CL 107 109  CO2 22 21*  GLUCOSE 123* 98  BUN 7 7   CREATININE 0.66 0.64  CALCIUM 9.1 9.1     Intake/Output Summary (Last 24 hours) at 10/10/2022 0905 Last data filed at 10/10/2022 0745 Gross per 24 hour  Intake 220 ml  Output --  Net 220 ml         Physical Exam: Vital Signs Blood pressure 109/65, pulse 75, temperature 98.1 F (36.7 C), resp. rate 15, height '5\' 5"'$  (1.651 m), weight 82.6 kg, last menstrual period 09/22/2022, SpO2 100 %.   General: No acute distress Mood and affect are appropriate Heart: Regular rate and rhythm no rubs murmurs or extra sounds Lungs: Clear to auscultation, breathing unlabored, no rales or wheezes Abdomen: Positive bowel sounds, soft nontender to palpation, nondistended Extremities: No clubbing, cyanosis, or edema Skin: No evidence of breakdown, no evidence of rash Neurologic: Cranial nerves II through XII intact, motor strength is 5/5 in Left and 4/5 right  deltoid, bicep, tricep, grip, 5/5 bilateral hip flexor, knee extensors, ankle dorsiflexor and plantar flexor Sensory exam normal sensation to light touch and proprioception in bilateral upper and lower extremities Cerebellar exam normal finger to nose to finger as well as heel to shin in bilateral upper and lower extremities Musculoskeletal: Full  range of motion in all 4 extremities. No joint swelling Neuro:  Eyes without evidence of nystagmus  Tone is normal without evidence of spasticity   Cranial nerves II- Visual fields are intact to confrontation testing, no blurring of vision III- no evidence of ptosis, upward, downward and medial gaze intact IV- no vertical diplopia or head tilt V- no facial numbness or masseter weakness VI- no pupil abduction weakness although pt c/o mild diplopia on lateral gaze bilaterally VII- LEFT facial droop, nl bilateral lid closure VII- normal auditory acuity IX- no pharygeal weakness, X- no pharyngeal weakness, no hoarseness XI- no trap or SCM weakness XII- no glossal weakness     Assessment/Plan: 1. Functional deficits which require 3+ hours per day of interdisciplinary therapy in a comprehensive inpatient rehab setting. Physiatrist is providing close team supervision and 24 hour management of active medical problems listed below. Physiatrist and rehab team continue to assess barriers to discharge/monitor patient progress toward functional and medical goals  Care Tool:  Bathing    Body parts bathed by patient: Right arm, Left arm, Chest, Front perineal area, Abdomen, Buttocks, Left upper leg, Right upper leg, Right lower leg, Face, Left lower leg         Bathing assist Assist Level: Contact Guard/Touching assist     Upper Body Dressing/Undressing Upper body dressing   What is the patient wearing?: Pull over shirt    Upper body assist Assist Level: Supervision/Verbal cueing    Lower Body Dressing/Undressing Lower body dressing      What is the patient wearing?: Underwear/pull up, Pants     Lower body assist Assist for lower body dressing: Minimal Assistance - Patient > 75%     Toileting Toileting    Toileting assist Assist for toileting: Minimal Assistance - Patient > 75%     Transfers Chair/bed transfer  Transfers assist     Chair/bed transfer assist level: Minimal Assistance - Patient > 75%     Locomotion Ambulation   Ambulation assist      Assist level: Moderate Assistance - Patient 50 - 74% Assistive device: Hand held assist Max distance: 30   Walk 10 feet activity   Assist     Assist level: Moderate Assistance - Patient - 50 - 74% Assistive device: Hand held assist   Walk 50 feet activity   Assist    Assist level: Moderate Assistance - Patient - 50 - 74% Assistive device: Hand held assist    Walk 150 feet activity   Assist Walk 150 feet activity did not occur: Safety/medical concerns         Walk 10 feet on uneven surface  activity   Assist     Assist level: Moderate Assistance - Patient -  50 - 74%     Wheelchair     Assist Is the patient using a wheelchair?: Yes Type of Wheelchair: Manual    Wheelchair assist level: Supervision/Verbal cueing Max wheelchair distance: 150    Wheelchair 50 feet with 2 turns activity    Assist        Assist Level: Supervision/Verbal cueing   Wheelchair 150 feet activity     Assist      Assist Level: Supervision/Verbal cueing   Blood pressure 109/65, pulse 75, temperature 98.1 F (36.7 C), resp. rate 15, height '5\' 5"'$  (1.651 m), weight 82.6 kg, last menstrual period 09/22/2022, SpO2 100 %.  Medical Problem List and Plan: 1. 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  Antithrombotics: -DVT/anticoagulation:  Pharmaceutical: Lovenox             -antiplatelet therapy: DAPT for 3 weeks followed by aspirin alone 3. Pain Management: Fioricet prn for HA 4. Mood/Behavior/Sleep: LCSW to follow for evaluation and support.              --trazodone prn for chronic insomnia (melatonin not very effective)             -antipsychotic agents: N/A 5. Neuropsych/cognition: This patient is capable of making decisions on her own behalf. 6. Skin/Wound Care:  Routine pressure relief measures 7. Fluids/Electrolytes/Nutrition:  Monitor I/O. Check CMET in am 8. Hypotension: SBP soft but increasing   Monitor for orthostatic symptoms Vitals:   10/09/22 2020 10/10/22 0548  BP: 109/64 109/65  Pulse: 84 75  Resp: 15 15  Temp: 98.3 F (36.8 C) 98.1 F (36.7 C)  SpO2: 99% 100%    9. Migraines: Has been managed with use of Topamax and Imitrex twice a month on average.  10.Transient hypokalemia: Resolved w/ supplement.      Latest Ref Rng & Units 10/09/2022    6:44 AM 10/08/2022    8:17 AM 10/07/2022    3:36 AM  BMP  Glucose 70 -  99 mg/dL 98  123  95   BUN 6 - 20 mg/dL '7  7  7   '$ Creatinine 0.44 - 1.00 mg/dL 0.64  0.66  0.53   Sodium 135 - 145 mmol/L 138  137  139   Potassium 3.5 - 5.1 mmol/L 3.9  3.8  3.7   Chloride 98 - 111 mmol/L 109  107  109   CO2 22 - 32 mmol/L '21  22  21   '$ Calcium 8.9 - 10.3 mg/dL 9.1  9.1  9.0     11. Low vitamin D level: Ergocalciferol-21.7 on 08/25/22             --resume supplement.   12. Neutropenia: mild stable - cont to monitor weekly     Latest Ref Rng & Units 10/09/2022    6:44 AM 10/08/2022    8:17 AM 10/07/2022    3:36 AM  CBC  WBC 4.0 - 10.5 K/uL 3.2  3.8  3.7   Hemoglobin 12.0 - 15.0 g/dL 10.9  11.4  11.1   Hematocrit 36.0 - 46.0 % 33.7  36.6  35.3   Platelets 150 - 400 K/uL 153  171  166     13. Intermittent constipation: No issues so far. Used colace 2x /wk prn at home.  14.  Hyperlipidemia.  Continue Lipitor '20mg'$     LOS: 2 days A FACE TO FACE EVALUATION WAS PERFORMED  Charlett Blake 10/10/2022, 9:05 AM

## 2022-10-10 NOTE — Plan of Care (Signed)
  Problem: RH Expression Communication Goal: LTG Patient will increase word finding of common (SLP) Description: LTG:  Patient will increase word finding of common objects/daily info/abstract thoughts with cues using compensatory strategies (SLP). Outcome: Completed/Met   

## 2022-10-10 NOTE — Progress Notes (Signed)
Physical Therapy Session Note  Patient Details  Name: Donna Mccarthy MRN: [id number] Date of Birth: October 09, 1975  Today's Date: 10/10/2022 PT Individual Time: [id number] PT Individual Time Calculation (min): 57 min   Short Term Goals: Week 1:  PT Short Term Goal 1 (Week 1): Pt will perform transfers with supervision assist and no AD PT Short Term Goal 2 (Week 1): Pt will ambulate >182f with supervision assist and LRAD PT Short Term Goal 3 (Week 1): Pt eill ascend 12 steps with CGA and 1 rails PT Short Term Goal 4 (Week 1): Pt will perform Berg balance scale  Skilled Therapeutic Interventions/Progress Updates:    Pt received supine in bed and agreeable to PT. Pt denies any pain or diplopia at the start of session. Pt reports the need to use the bathroom. Transferred sitting to EOB w/ supervision and ambulated to the bathroom w/ RW and CGA. Pt was continent of bladder and independent w/ pericare. Pt donned shirt in sitting independently. Ambulated out the bathroom w/ RW and CGA and performed hand hygiene at the sink standing w/ supervision.   Gait training ~75 ft w/ RW and CGA +2 for WC follow. Pt demonstrated decreased wt shift to the R and decreased stride length B. Pt requested to sit down, stating that she felt a heaviness in her RLE but denies pain. Pt transported dependently to the therapy gym. MD entered to assess pt.  Performed stepping wt shifts over 2" yoga block 2x6 B w/ CGA inside RW. Vc for pt to shift weight on to RLE when stepping w/ the L foot. Pt expressed that the heaviness in the R leg increased but was not painful. When stepping over w the R foot, pt demonstrated decreased DF and hip flexion to lift LE. Vc to avoid hitting block, facilitated increased flexion. Pt performed STS from EOM w/ mirror in front for visual cueing. Pt performed 1x6 w/ no AD and mid A for R wt shift to encourage pt to pushing through both LE to stand. 2x6 STS w/ 1" step under pts L foot w/ RW in front if  needed and supervision A. Emphasis on encouraging pt to increase weight through R foot. Pt became emotional seemingly out decreased confidence in using RLE. With encouragement pt was able to complete the task and increased her self-efficacy. Pt performed stepping a reaching tasks by transferring 8 horse hoes x2. Two tables positioned out ~351fin front of pt, pt instructed to step forward w/ L foot to grab horse shoe then transfer it to the other table by stepping w/ the R foot forward and vice versa to return the horse shoes. Weakness noted in RLE and Pt expressed that she felt her RLE shaking. Therapist proving CGA for pt. Pt demonstrated effective coordination w/ the side to side movement, however, lacks confidence when leading w/ R foot. Pt transported back to room dependently and [facility] on, call bell in reach and all needs met.    Therapy Documentation Precautions:  Precautions Precautions: Fall Precaution Comments: double vision Restrictions Weight Bearing Restrictions: No        Therapy/Group: Individual Therapy  Danuta Huseman 10/10/2022, 11:15 AM

## 2022-10-10 NOTE — Progress Notes (Signed)
Physical Therapy Session Note  Patient Details  Name: Donna Mccarthy MRN: [id number] Date of Birth: 11-28-1975  Today's Date: 10/10/2022 PT Individual Time: [id number] PT Individual Time Calculation (min): 57 min   Short Term Goals: Week 1:  PT Short Term Goal 1 (Week 1): Pt will perform transfers with supervision assist and no AD PT Short Term Goal 2 (Week 1): Pt will ambulate >158f with supervision assist and LRAD PT Short Term Goal 3 (Week 1): Pt eill ascend 12 steps with CGA and 1 rails PT Short Term Goal 4 (Week 1): Pt will perform Berg balance scale  Skilled Therapeutic Interventions/Progress Updates:   Pt received sitting up an eating lunch in recliner chair and agreeable to therapy. Pt expressed that legs felt like jello, but was ready to participate. Gait training ~1515fto therapy gym w/ RW and CGA. Pt demonstrated narrow BOS. Vc to increase BOS was demonstrated by pt well. Walking around 4 cone obstacles, no AD and w/ one HHA 2x~30 ft. Emphasizing maintaining postural control when stepping around cones. Pt has a tendency to exhibit a scissoring gait on the R>L which results in a R postural lean. Therapist  cued pt to increase lateral step w/ the R foot to avoid increase adduction. Step ups in the parallel bars 2x5 leading w/ each foot w/ supervision A to work on LE strength and motor control. Pt tended to use BUE to power up rather than LE when leading w/ R foot. Pt cued to lean trunk slightly forward to get more weight into her R foot then push up. Sit to stand x5 w/ a focus on functional movement pattern. Pt cued to increase width between feet to help with generating enough power to stand up. Lateral side stepping at parallel bar down and back 3x. Pt required min vc to shift weight over to the L when leading w/ the R foot to step. Nu-step 1x3m3m--> 1x2mi38mo work of LE NMR and a emphasize a reciprocal gait pattern. Pt struggled w/ pushing the pedal using the RLE. Pt demonstrated an  increase in effort to push the pedal. Pt found exercise fatiguing, but denied any pain. Pt performed a stand pivot transfer to Nu-step and back to WC. [facility] for armrest[facility] outside BOS nearly resulting in LOB. Therapist there providing CGA and educated the pt on the importance of slowing down when feeling weak or off balance to ensure safety. Pt [facility] depend[facility] performed a step and pivot transfer to recliner w/o AD and HHA. Pt left sitting in recliner w/ call bell in reach and all needs met.   Therapy Documentation Precautions:  Precautions Precautions: Fall Precaution Comments: double vision Restrictions Weight Bearing Restrictions: No General:  Pain: Pt denies any pain          Therapy/Group: Individual Therapy  Ayane Delancey 10/10/2022, 1:38 PM

## 2022-10-10 NOTE — IPOC Note (Signed)
Overall Plan of Care [facility]) Patient Details Name: Donna Mccarthy MRN: [id number] DOB: 07/08/1976  Admitting Diagnosis: Stroke (cerebrum) [facility])  Hospital Problems: Principal Problem:   Stroke (cerebrum) [facility])     Functional Problem List: Nursing Pain, Bowel, Safety, Sensory, Endurance, Medication Management, Motor  PT Balance, Endurance, Motor, Perception, Sensory  OT Balance, Cognition, Endurance, Motor, Nutrition, Perception, Safety, Sensory, Skin Integrity, Vision  SLP Cognition, Linguistic  TR         Basic ADL's: OT Eating, Grooming, Bathing, Dressing, Toileting     Advanced  ADL's: OT Simple Meal Preparation, Light Housekeeping     Transfers: PT Bed Mobility, Bed to Chair, Car, [employer], Editor, commissioning, Agricultural engineer: PT Ambulation, Emergency planning/management officer, Stairs     Additional Impairments: OT Fuctional Use of Upper Extremity  SLP Social Cognition, Communication expression Problem Solving, Memory  TR      Anticipated Outcomes Item Anticipated Outcome  Self Feeding Mod I  Swallowing  N/A   Basic self-care  Mod I  Toileting  Mod I   Bathroom Transfers Mod I  Bowel/Bladder  continent B/B  Transfers  Mod I with LRAD  Locomotion  Mod I ambulatory with LRAD  Communication  mod I  Cognition  mod I-to-sup A  Pain  less than 4  Safety/Judgment  remain fall free while in rehab   Therapy Plan: PT Intensity: Minimum of 1-2 x/day ,45 to 90 minutes PT Frequency: 5 out of 7 days PT Duration Estimated Length of Stay: 10-14 days OT Intensity: Minimum of 1-2 x/day, 45 to 90 minutes OT Frequency: 5 out of 7 days OT Duration/Estimated Length of Stay: 10-12 days SLP Intensity: Minumum of 1-2 x/day, 30 to 90 minutes SLP Frequency: 3 to 5 out of 7 days SLP Duration/Estimated Length of Stay: 10-12 days   Team Interventions: Nursing Interventions Patient/Family Education, Pain Management, Medication Management, Discharge Planning, Bowel  Management, Psychosocial Support, Disease Management/Prevention  PT interventions Balance/vestibular training, Cognitive remediation/compensation, Disease management/prevention, Discharge planning, DME/adaptive equipment instruction, Community reintegration, Ambulation/gait training, Functional electrical stimulation, Neuromuscular re-education, Pain management, Functional mobility training, Patient/family education, Skin care/wound management, Splinting/orthotics, Psychosocial support, Stair training, UE/LE Coordination activities, Therapeutic Activities, Therapeutic Exercise, Visual/perceptual remediation/compensation, UE/LE Strength taining/ROM, Wheelchair propulsion/positioning  OT Interventions Training and development officer, Cognitive remediation/compensation, Community reintegration, Discharge planning, Disease mangement/prevention, DME/adaptive equipment instruction, Functional electrical stimulation, Functional mobility training, Neuromuscular re-education, Pain management, Patient/family education, Psychosocial support, Self Care/advanced ADL retraining, Skin care/wound managment, Splinting/orthotics, Therapeutic Activities, Therapeutic Exercise, UE/LE Strength taining/ROM, UE/LE Coordination activities, Visual/perceptual remediation/compensation, Wheelchair propulsion/positioning  SLP Interventions Cognitive remediation/compensation, Functional tasks, Patient/family education, Therapeutic Activities  TR Interventions    SW/CM Interventions Discharge Planning, Psychosocial Support, Patient/Family Education, Disease Management/Prevention   Barriers to Discharge MD  Medical stability  Nursing Decreased caregiver support, Home environment access/layout, Weight 2 level home with B/B main level with 1 ste rail on left  PT Inaccessible home environment, Decreased caregiver support, Home environment Child psychotherapist, Insurance underwriter for SNF coverage    OT      SLP      SW Insurance for SNF coverage,  Decreased caregiver support, Lack of/limited family support     Team Discharge Planning: Destination: PT-Home ,OT- Home , SLP-Home Projected Follow-up: PT-Outpatient PT, OT-  Outpatient OT, SLP-Other (comment) (TBD) Projected Equipment Needs: PT-To be determined, Rolling walker with 5" wheels, Cane, OT- To be determined, SLP-None recommended by SLP Equipment Details: PT- , OT-  Patient/family involved in discharge planning: PT- Patient,  OT-Patient,  SLP-Patient  MD ELOS: 11-15d Medical Rehab Prognosis:  Good Assessment: The patient has been admitted for CIR therapies with the diagnosis of stroke. The team will be addressing functional mobility, strength, stamina, balance, safety, adaptive techniques and equipment, self-care, bowel and bladder mgt, patient and caregiver education, visual disturbance post stroke. Goals have been set at Mod I. Anticipated discharge destination is Home .  See Team Conference Notes for weekly updates to the plan of care

## 2022-10-10 NOTE — Anesthesia Postprocedure Evaluation (Signed)
Anesthesia Post Note  Patient: Donna Mccarthy  Procedure(s) Performed: TRANSESOPHAGEAL ECHOCARDIOGRAM (TEE)     Patient location during evaluation: Endoscopy Anesthesia Type: MAC Level of consciousness: awake Pain management: pain level controlled Vital Signs Assessment: post-procedure vital signs reviewed and stable Respiratory status: spontaneous breathing Cardiovascular status: stable Postop Assessment: no apparent nausea or vomiting Anesthetic complications: no  No notable events documented.  Last Vitals:  Vitals:   10/08/22 1229 10/08/22 1652  BP: (!) 96/57 113/61  Pulse: 86 85  Resp: 16 16  Temp: (!) 36.3 C 36.6 C  SpO2: 100% 100%    Last Pain:  Vitals:   10/08/22 1652  TempSrc: Oral  PainSc:    Pain Goal:                   Huston Foley

## 2022-10-10 NOTE — Progress Notes (Signed)
Occupational Therapy Session Note  Patient Details  Name: Donna Mccarthy MRN: [id number] Date of Birth: [birth year]  Today's Date: 10/10/2022 OT Individual Time: [id number] OT Individual Time Calculation (min): 49 min    Short Term Goals: Week 1:  OT Short Term Goal 1 (Week 1): Patient will ambulate with device and min A to transition into bathroom. OT Short Term Goal 2 (Week 1): Patient will maintain dynamic standing balance within functional BADL task with CGA OT Short Term Goal 3 (Week 1): Patient will don shoes with set-set-up A.  Skilled Therapeutic Interventions/Progress Updates:  Pt greeted seated in w/c, pt agreeable to OT intervention. Session focus on BADL reeducation, functional mobility, dynamic standing balance and decreasing overall caregiver burden.       Pt completed ambulatory shower transfer with RW to bathroom with MINA. Pt completed bathing from shower seat/standing with overall CGA. Pt  reports tub shower at home with seat being ordered and family working on installing hand held shower head.  Pt exited shower via stand pivot with Rw > w/c. Pt completed dressing from w/c with overall CGA for sit>stand to pul pants to waist line.   Pt transported to gym in w/c for time mgmt where remainder of session focused on visual scanning tasks. Pt continues to reports blurry peripheral vision with pt reporting that she feels like she needs to turn her head full to R<>L to see an image. Worked on table top visual scanning tasks to further assess vision. Pt completed line bisection test with slight preference to L side but overall WFL. Additionally worked on table top visual scanning tasks with pt instructed to cross out indicated letter on paper with an emphasis on scanning R<>L to work on peripheral vision. Pt reports tasks is difficult because it was too visually chaotic but overall complete task with 90% accuracy.  Ended session with pt seated in recliner with Nt present.              Therapy Documentation Precautions:  Precautions Precautions: Fall Precaution Comments: double vision Restrictions Weight Bearing Restrictions: No    Pain: No pain    Therapy/Group: Individual Therapy  Corinne [place] [facility] 10/10/2022, 12:08 PM

## 2022-10-11 MED ORDER — DOCUSATE SODIUM 100 MG PO CAPS: 100.0000 mg | ORAL_CAPSULE | Freq: Two times a day (BID) | ORAL | Status: DC | PRN

## 2022-10-11 NOTE — Progress Notes (Signed)
Occupational Therapy Session Note  Patient Details  Name: Donna Mccarthy MRN: [id number] Date of Birth: [birth year]  Today's Date: 10/11/2022 OT Individual Time: 0100-0215 OT Individual Time Calculation (min): 75 min    Short Term Goals: Week 1:  OT Short Term Goal 1 (Week 1): Patient will ambulate with device and min A to transition into bathroom. OT Short Term Goal 2 (Week 1): Patient will maintain dynamic standing balance within functional BADL task with CGA OT Short Term Goal 3 (Week 1): Patient will don shoes with set-set-up A.  Skilled Therapeutic Interventions/Progress Updates:    The pt seated in recliner upon arrival with family present at the time of treatment.  The pt agreed to completing BADL related task in showering.  The pt was able to transfer from the recliner to standing with close S using the RW.  The pt was able to ambulate to the shower using the RW with close S and was able to return to sit at w/c LOF to doff UB/LB clothing items with SBA.  The pt was able to bath UB/UB with close S.  The pt was able to exit the shower using the GB and was able to transfer to the w/c with close S.  The pt was able to apply deodorant and lotion with s/u assist.  The pt went on to donn her UB for a pull over shirt with s/u assist, she was able to dress her LB with inclusive of underwear and pants with s/u assist. The pt was able to ambulate to the recliner using the RW for additional balance.    The pt was able to take 1 rest break, while seated in the recliner.  The pt went on to complete  eye exercises by occluding the L eye and scanning from The corner to various objects on the wall to the opposite corner 3x.  The pt went on to complete UB towel exercise while seated unsupported in the recliner 2 sets of 10 with rest breaks as needed for shld flexion, horizontal abduction shld rotation , and large circles. The pt was able to tolerate resistive exercises by holding the towel with BUE while  external force was exerted on the towel 3X for a count of 10.  At the end of treatment the bedside table and call light were within reach and all additional needs were addressed.   Therapy Documentation Precautions:  Precautions Precautions: Fall Precaution Comments: double vision Restrictions Weight Bearing Restrictions: No  Therapy/Group: Individual Therapy  Yvonne Kendall 10/11/2022, 3:38 PM

## 2022-10-11 NOTE — Progress Notes (Signed)
Speech Language Pathology Daily Session Note  Patient Details  Name: Donna Mccarthy MRN: [id number] Date of Birth: 1976-03-25  Today's Date: 10/11/2022 SLP Individual Time: 0800-0900 SLP Individual Time Calculation (min): 60 min  Short Term Goals: Week 1: SLP Short Term Goal 1 (Week 1): Patient will utilize memory compensations to recall novel information with sup A verbal cues SLP Short Term Goal 2 (Week 1): Patient will complete problem solving pertaining to iADLs (e.g., medication/money management, with min A verbal cues SLP Short Term Goal 3 (Week 1): Patient will complete complex scheduling and organizing tasks with min A verbal cues SLP Short Term Goal 4 (Week 1): Patient will verbalize recall and utilize word finding strategies during structured and/or unstructured cognitive-linguistic/language tasks with sup A verbal cues  Skilled Therapeutic Interventions: Skilled ST treatment focused on cognitive-communication goals. Pt was greeted in bed on arrival and in good spirits. She reported she was able to independently implement word finding and memory strategies during a conversation with a friend last night which ultimately repaired communication exchange. Pt/SLP engaged in a discussion about compensatory memory strategies, including using phone to take notes, reminders, calendar, etc. Pt reported in addition to using her phone, she likes to write things down. SLP provided pt with a notebook to use while in rehab.   SLP facilitated use of external memory aid to recall current medications. 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 Pt completed with sup A verbal cues fading to mod I to visually orient to the chart. Pt recalled uses for medications with 100%  accuracy at independent level. Pt just reported it took her some extra processing time for spelling.  Pt responded to hypothetical scenarios pertaining to medications with appropriate responses at the independent level. Pt identified intentional BID pillbox organization errors with sup A verbal cues at supervision level. Errors were felt to be attributed to visual perceptual deficits. Pt was able to consistently identify and correct error once pointed out by SLP.   Patient was left in bed with alarm activated and immediate needs within reach at end of session. Continue per current plan of care.      Pain  None/denied  Therapy/Group: Individual Therapy  Patty Sermons 10/11/2022, 8:38 AM

## 2022-10-11 NOTE — Progress Notes (Signed)
PROGRESS NOTE   Subjective/Complaints:  Pt without complaints today. Slept ok, pain doing fine, LBM yesterday, and urinating well. Some soreness after PT yesterday, in thighs, not really painful, wants Kpad order. No other complaints or concerns.   ROS: +thigh soreness. Denies fevers, chills, CP, SOB, abd pain, N/V/D/C, new/worsening paresthesias/weakness, or any other complaints at this time.   Objective:   No results found. Recent Labs    10/09/22 0644  WBC 3.2*  HGB 10.9*  HCT 33.7*  PLT 153    Recent Labs    10/09/22 0644  NA 138  K 3.9  CL 109  CO2 21*  GLUCOSE 98  BUN 7  CREATININE 0.64  CALCIUM 9.1     Intake/Output Summary (Last 24 hours) at 10/11/2022 0828 Last data filed at 10/11/2022 0732 Gross per 24 hour  Intake 660 ml  Output --  Net 660 ml         Physical Exam: Vital Signs Blood pressure 110/61, pulse 70, temperature 98.4 F (36.9 C), resp. rate 18, height '5\' 5"'$  (1.651 m), weight 82.6 kg, last menstrual period 09/22/2022, SpO2 98 %.   General: No acute distress, sitting in chair.  Mood and affect are appropriate Heart: Regular rate and rhythm no rubs murmurs or extra sounds Lungs: Clear to auscultation, breathing unlabored, no rales or wheezes Abdomen: Positive bowel sounds, soft nontender to palpation, nondistended Extremities: No clubbing, cyanosis, or edema  PRIOR EXAM: Skin: No evidence of breakdown, no evidence of rash Neurologic: Cranial nerves II through XII intact, motor strength is 5/5 in Left and 4/5 right  deltoid, bicep, tricep, grip, 5/5 bilateral hip flexor, knee extensors, ankle dorsiflexor and plantar flexor Sensory exam normal sensation to light touch and proprioception in bilateral upper and lower extremities Cerebellar exam normal finger to nose to finger as well as heel to shin in bilateral upper and lower extremities Musculoskeletal: Full range of motion in all 4  extremities. No joint swelling Neuro:  Eyes without evidence of nystagmus  Tone is normal without evidence of spasticity   Cranial nerves II- Visual fields are intact to confrontation testing, no blurring of vision III- no evidence of ptosis, upward, downward and medial gaze intact IV- no vertical diplopia or head tilt V- no facial numbness or masseter weakness VI- no pupil abduction weakness although pt c/o mild diplopia on lateral gaze bilaterally VII- LEFT facial droop, nl bilateral lid closure VII- normal auditory acuity IX- no pharygeal weakness, X- no pharyngeal weakness, no hoarseness XI- no trap or SCM weakness XII- no glossal weakness    Assessment/Plan: 1. Functional deficits which require 3+ hours per day of interdisciplinary therapy in a comprehensive inpatient rehab setting. Physiatrist is providing close team supervision and 24 hour management of active medical problems listed below. Physiatrist and rehab team continue to assess barriers to discharge/monitor patient progress toward functional and medical goals  Care Tool:  Bathing    Body parts bathed by patient: Right arm, Left arm, Chest, Front perineal area, Abdomen, Buttocks, Left upper leg, Right upper leg, Right lower leg, Face, Left lower leg         Bathing assist Assist Level: Contact Guard/Touching assist  Upper Body Dressing/Undressing Upper body dressing   What is the patient wearing?: Pull over shirt    Upper body assist Assist Level: Supervision/Verbal cueing    Lower Body Dressing/Undressing Lower body dressing      What is the patient wearing?: Underwear/pull up, Pants     Lower body assist Assist for lower body dressing: Minimal Assistance - Patient > 75%     Toileting Toileting    Toileting assist Assist for toileting: Minimal Assistance - Patient > 75%     Transfers Chair/bed transfer  Transfers assist     Chair/bed transfer assist level: Minimal Assistance - Patient  > 75%     Locomotion Ambulation   Ambulation assist      Assist level: Moderate Assistance - Patient 50 - 74% Assistive device: Hand held assist Max distance: 30   Walk 10 feet activity   Assist     Assist level: Moderate Assistance - Patient - 50 - 74% Assistive device: Hand held assist   Walk 50 feet activity   Assist    Assist level: Moderate Assistance - Patient - 50 - 74% Assistive device: Hand held assist    Walk 150 feet activity   Assist Walk 150 feet activity did not occur: Safety/medical concerns         Walk 10 feet on uneven surface  activity   Assist     Assist level: Moderate Assistance - Patient - 50 - 74%     Wheelchair     Assist Is the patient using a wheelchair?: Yes Type of Wheelchair: Manual    Wheelchair assist level: Supervision/Verbal cueing Max wheelchair distance: 150    Wheelchair 50 feet with 2 turns activity    Assist        Assist Level: Supervision/Verbal cueing   Wheelchair 150 feet activity     Assist      Assist Level: Supervision/Verbal cueing   Blood pressure 110/61, pulse 70, temperature 98.4 F (36.9 C), resp. rate 18, height '5\' 5"'$  (1.651 m), weight 82.6 kg, last menstrual period 09/22/2022, SpO2 98 %.  Medical Problem List and Plan: 1. 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  Antithrombotics: -DVT/anticoagulation:  Pharmaceutical: Lovenox '40mg'$  QD -antiplatelet therapy: DAPT (ASA '81mg'$  QD/Plavix '75mg'$  QD) for 3 weeks followed by aspirin alone (end date: 10/24/22) 3. Pain Management: Fioricet 1 tab prn for HA  -10/11/22 Kpad ordered for thigh soreness 4. Mood/Behavior/Sleep: LCSW to follow for evaluation and support.              --trazodone prn for chronic  insomnia (melatonin not very effective)             -antipsychotic agents: N/A  -wellbutrin '300mg'$  QD 5. Neuropsych/cognition: This patient is capable of making decisions on her own behalf. 6. Skin/Wound Care:  Routine pressure relief measures 7. Fluids/Electrolytes/Nutrition:  Monitor I/O. Stable labs 10/09/22. Monitor weekly labs starting 10/13/22 8. Hypotension: SBP soft but increasing   Monitor for orthostatic symptoms  -10/11/22 BP stable, monitor Vitals:   10/08/22 1815 10/08/22 1941 10/09/22 0602 10/09/22 2020  BP: 118/75 (!) 114/59 Marland Kitchen)  100/56 109/64   10/10/22 0548 10/10/22 1302 10/10/22 2023 10/11/22 0455  BP: 109/65 110/72 119/71 110/61     9. Migraines: Has been managed with use of Topamax '75mg'$  QHS and Imitrex twice a month on average.  10.Transient hypokalemia: Resolved w/ supplement.  Monitor on weekly labs, next 10/13/22    Latest Ref Rng & Units 10/09/2022    6:44 AM 10/08/2022    8:17 AM 10/07/2022    3:36 AM  BMP  Glucose 70 - 99 mg/dL 98  123  95   BUN 6 - 20 mg/dL '7  7  7   '$ Creatinine 0.44 - 1.00 mg/dL 0.64  0.66  0.53   Sodium 135 - 145 mmol/L 138  137  139   Potassium 3.5 - 5.1 mmol/L 3.9  3.8  3.7   Chloride 98 - 111 mmol/L 109  107  109   CO2 22 - 32 mmol/L '21  22  21   '$ Calcium 8.9 - 10.3 mg/dL 9.1  9.1  9.0     11. Low vitamin D level: Ergocalciferol-21.7 on 08/25/22             --resume supplement 50000IU/week 12. Neutropenia: mild stable - cont to monitor weekly, next 10/13/22    Latest Ref Rng & Units 10/09/2022    6:44 AM 10/08/2022    8:17 AM 10/07/2022    3:36 AM  CBC  WBC 4.0 - 10.5 K/uL 3.2  3.8  3.7   Hemoglobin 12.0 - 15.0 g/dL 10.9  11.4  11.1   Hematocrit 36.0 - 46.0 % 33.7  36.6  35.3   Platelets 150 - 400 K/uL 153  171  166     13. Intermittent constipation: No issues so far. Used colace 2x /wk prn at home.  -10/11/22 LBM yesterday, cont regimen (Miralax 17g QD PRN, Senokot-S 1 tab QHS PRN, fleet's PRN, dulcolax supp PRN)-- will add Colace  '100mg'$  BID PRN as well 14.  Hyperlipidemia.  Continue Lipitor '20mg'$  QD 15. Hypothyroidism: cont Armour thyroid '180mg'$  QD    LOS: 3 days A FACE TO FACE EVALUATION WAS PERFORMED  [id number] Anniya Whiters 10/11/2022, 8:28 AM

## 2022-10-11 NOTE — Progress Notes (Signed)
Physical Therapy Session Note  Patient Details  Name: Donna Mccarthy MRN: [id number] Date of Birth: Aug 16, 1976  Today's Date: 10/11/2022 PT Individual Time: 1003-1102 PT Individual Time Calculation (min): 59 min   Short Term Goals: Week 1:  PT Short Term Goal 1 (Week 1): Pt will perform transfers with supervision assist and no AD PT Short Term Goal 2 (Week 1): Pt will ambulate >19f with supervision assist and LRAD PT Short Term Goal 3 (Week 1): Pt eill ascend 12 steps with CGA and 1 rails PT Short Term Goal 4 (Week 1): Pt will perform Berg balance scale  Skilled Therapeutic Interventions/Progress Updates:   Pt received sitting in recliner and agreeable to PT. Pt performed ambulatory transfer with RW  and distant supervision assist from PT for safety. Gait training through hall 2 x [id number] and from room with RW and supervision assist for safety with no overt knee instability.   Pt performed dynamic gait training with RW forward/reverse 2 x 15 with supervision assist and without AD 2 x 15 with min assist.  Side stepping at rail on wall in dayroom 2 x 12 bil.   gait training with single leg  Foot tap on 6 bean bags with x [id number] tap on 6 inch cone x 6 while gait training x [id number] stepping R and L 2 x 12 ft  PT provided CGA-min assist with no AD with cues for increased cadence to improve trunkal control  and normalize movement patterns. Pt noted to have moments for L knee instability, with sudden knee flexion, but continues to report feeling that R knee in week and unstable with initial steps of gait with no AD, but able to complete foot tap on cone without instability on R or L. Will continue to monitor  Nustep reciprocal movement training 4 min+3 min with therapeutic rest break between bouts. Cues for full ROM on the RLE and RUE intermittently.   Patient demonstrates increased fall risk as noted by score of   15/56 on Berg Balance Scale.  (<36= high risk for falls, close to  100%; 37-45 significant >80%; 46-51 moderate >50%; 52-55 lower >25%). Inconsistent presentation of LOB depending on movement assessed in berg.   Patient returned to room and performed stand pivot to recliner with RW and supervision assist. Pt left sitting in recliner with call bell in reach and all needs met.         Therapy Documentation Precautions:  Precautions Precautions: Fall Precaution Comments: double vision Restrictions Weight Bearing Restrictions: No    Pain:   denies    Balance: Standardized Balance Assessment Standardized Balance Assessment: Berg Balance Test Berg Balance Test Sit to Stand: Able to stand  independently using hands Standing Unsupported: Able to stand 30 seconds unsupported Sitting with Back Unsupported but Feet Supported on Floor or Stool: Able to sit safely and securely 2 minutes Stand to Sit: Controls descent by using hands Transfers: Needs one person to assist Standing Unsupported with Eyes Closed: Able to stand 3 seconds Standing Ubsupported with Feet Together: Needs help to attain position and unable to hold for 15 seconds From Standing, Reach Forward with Outstretched Arm: Loses balance while trying/requires external support From Standing Position, Pick up Object from Floor: Unable to try/needs assist to keep balance From Standing Position, Turn to Look Behind Over each Shoulder: Needs assist to keep from losing balance and falling Turn 360 Degrees: Needs assistance while turning Standing Unsupported, Alternately Place Feet on Step/Stool: Needs  assistance to keep from falling or unable to try Standing Unsupported, One Foot in Front: Loses balance while stepping or standing Standing on One Leg: Unable to try or needs assist to prevent fall Total Score: 15  Therapy/Group: Individual Therapy  Lorie Phenix 10/11/2022, 11:05 AM

## 2022-10-11 NOTE — Plan of Care (Signed)
  Problem: Consults Goal: RH STROKE PATIENT EDUCATION Description: See Patient Education module for education specifics  Outcome: Progressing   Problem: RH BOWEL ELIMINATION Goal: RH STG MANAGE BOWEL WITH ASSISTANCE Description: STG Manage Bowel with supv Assistance. Outcome: Progressing Goal: RH STG MANAGE BOWEL W/MEDICATION W/ASSISTANCE Description: STG Manage Bowel with Medication with supv Assistance. Outcome: Progressing   Problem: RH BLADDER ELIMINATION Goal: RH STG MANAGE BLADDER WITH ASSISTANCE Description: STG Manage Bladder With supv Assistance Outcome: Progressing   Problem: RH SKIN INTEGRITY Goal: RH STG SKIN FREE OF INFECTION/BREAKDOWN Description: Skin will remain free of infection/breakdown with min assist Outcome: Progressing   Problem: RH SAFETY Goal: RH STG ADHERE TO SAFETY PRECAUTIONS W/ASSISTANCE/DEVICE Description: STG Adhere to Safety Precautions With cueing Assistance/Device. Outcome: Progressing   Problem: RH COGNITION-NURSING Goal: RH STG ANTICIPATES NEEDS/CALLS FOR ASSIST W/ASSIST/CUES Description: STG Anticipates Needs/Calls for Assist With cuing  Assistance/Cues. Outcome: Progressing   Problem: RH PAIN MANAGEMENT Goal: RH STG PAIN MANAGED AT OR BELOW PT'S PAIN GOAL Description: Pain will be managed 4 out of 10 on pain scale with PRN medications min assist Outcome: Progressing   Problem: RH KNOWLEDGE DEFICIT Goal: RH STG INCREASE KNOWLEDGE OF STROKE PROPHYLAXIS Description: Patient/caregiver will be able to manage medications and diet from nursing education and nursing handouts independently  Outcome: Progressing   Problem: RH Vision Goal: RH LTG Vision (Specify) Outcome: Progressing   Problem: Education: Goal: Knowledge of disease or condition will improve Outcome: Progressing Goal: Knowledge of secondary prevention will improve (MUST DOCUMENT ALL) Outcome: Progressing Goal: Knowledge of patient specific risk factors will improve [place]ta [place]  N/A or DELETE if not current risk factor) Outcome: Progressing   Problem: Ischemic Stroke/TIA Tissue Perfusion: Goal: Complications of ischemic stroke/TIA will be minimized Outcome: Progressing   Problem: Coping: Goal: Will verbalize positive feelings about self Outcome: Progressing Goal: Will identify appropriate support needs Outcome: Progressing   Problem: Health Behavior/Discharge Planning: Goal: Ability to manage health-related needs will improve Outcome: Progressing Goal: Goals will be collaboratively established with patient/family Outcome: Progressing   Problem: Self-Care: Goal: Ability to participate in self-care as condition permits will improve Outcome: Progressing Goal: Verbalization of feelings and concerns over difficulty with self-care will improve Outcome: Progressing Goal: Ability to communicate needs accurately will improve Outcome: Progressing   Problem: Nutrition: Goal: Risk of aspiration will decrease Outcome: Progressing Goal: Dietary intake will improve Outcome: Progressing

## 2022-10-12 ENCOUNTER — Inpatient Hospital Stay (HOSPITAL_COMMUNITY): Payer: 59

## 2022-10-12 DIAGNOSIS — M79604 Pain in right leg: Secondary | ICD-10-CM

## 2022-10-12 DIAGNOSIS — M79661 Pain in right lower leg: Secondary | ICD-10-CM

## 2022-10-12 MED ORDER — MUSCLE RUB 10-15 % EX CREA: TOPICAL_CREAM | CUTANEOUS | Status: DC | PRN

## 2022-10-12 MED ORDER — DICLOFENAC SODIUM 1 % EX GEL
2.0000 g | Freq: Four times a day (QID) | CUTANEOUS | Status: DC
  Administered 2022-10-12 – 2022-10-22 (×19): 2 g via TOPICAL
  Filled 2022-10-12: qty 100

## 2022-10-12 NOTE — Progress Notes (Signed)
PROGRESS NOTE   Subjective/Complaints:  Pt having worsening R calf pain today, had some soreness in thigh/calf yesterday but today it's more painful. Located mostly in posterior knee/popliteal fossa area, and lower calf. No swelling.  Slept poorly because of pain.  LBM last night.  Urinating well.  No other complaints today.   ROS: +R calf/knee pain, +thigh soreness. Denies fevers, chills, CP, SOB, abd pain, N/V/D/C, new/worsening paresthesias/weakness, or any other complaints at this time.   Objective:   No results found. No results for input(s): "WBC", "HGB", "HCT", "PLT" in the last 72 hours.  No results for input(s): "NA", "K", "CL", "CO2", "GLUCOSE", "BUN", "CREATININE", "CALCIUM" in the last 72 hours.   Intake/Output Summary (Last 24 hours) at 10/12/2022 1110 Last data filed at 10/12/2022 0744 Gross per 24 hour  Intake 460 ml  Output --  Net 460 ml        Physical Exam: Vital Signs Blood pressure (!) 103/58, pulse 73, temperature 98.2 F (36.8 C), temperature source Oral, resp. rate 17, height '5\' 5"'$  (1.651 m), weight 82.6 kg, last menstrual period 09/22/2022, SpO2 100 %.   General: No acute distress, sitting in chair with PT Mood and affect are appropriate Heart: Regular rate and rhythm no rubs murmurs or extra sounds Lungs: Clear to auscultation, breathing unlabored, no rales or wheezes Abdomen: Positive bowel sounds, soft nontender to palpation, nondistended Extremities: No clubbing, cyanosis, or edema. Moderate tenderness to distal R calf area, nearing the achilles, and mild tenderness in R popliteal fossa area/proximal calf. No swelling or discoloration. No focal knee joint line tenderness.   PRIOR EXAM: Skin: No evidence of breakdown, no evidence of rash Neurologic: Cranial nerves II through XII intact, motor strength is 5/5 in Left and 4/5 right  deltoid, bicep, tricep, grip, 5/5 bilateral hip flexor,  knee extensors, ankle dorsiflexor and plantar flexor Sensory exam normal sensation to light touch and proprioception in bilateral upper and lower extremities Cerebellar exam normal finger to nose to finger as well as heel to shin in bilateral upper and lower extremities Musculoskeletal: Full range of motion in all 4 extremities. No joint swelling Neuro:  Eyes without evidence of nystagmus  Tone is normal without evidence of spasticity   Cranial nerves II- Visual fields are intact to confrontation testing, no blurring of vision III- no evidence of ptosis, upward, downward and medial gaze intact IV- no vertical diplopia or head tilt V- no facial numbness or masseter weakness VI- no pupil abduction weakness although pt c/o mild diplopia on lateral gaze bilaterally VII- LEFT facial droop, nl bilateral lid closure VII- normal auditory acuity IX- no pharygeal weakness, X- no pharyngeal weakness, no hoarseness XI- no trap or SCM weakness XII- no glossal weakness    Assessment/Plan: 1. Functional deficits which require 3+ hours per day of interdisciplinary therapy in a comprehensive inpatient rehab setting. Physiatrist is providing close team supervision and 24 hour management of active medical problems listed below. Physiatrist and rehab team continue to assess barriers to discharge/monitor patient progress toward functional and medical goals  Care Tool:  Bathing    Body parts bathed by patient: Right arm, Left arm, Chest, Front perineal area, Abdomen,  Buttocks, Left upper leg, Right upper leg, Right lower leg, Face, Left lower leg         Bathing assist Assist Level: Contact Guard/Touching assist     Upper Body Dressing/Undressing Upper body dressing   What is the patient wearing?: Pull over shirt    Upper body assist Assist Level: Supervision/Verbal cueing    Lower Body Dressing/Undressing Lower body dressing      What is the patient wearing?: Underwear/pull up, Pants      Lower body assist Assist for lower body dressing: Minimal Assistance - Patient > 75%     Toileting Toileting    Toileting assist Assist for toileting: Minimal Assistance - Patient > 75%     Transfers Chair/bed transfer  Transfers assist     Chair/bed transfer assist level: Minimal Assistance - Patient > 75%     Locomotion Ambulation   Ambulation assist      Assist level: Moderate Assistance - Patient 50 - 74% Assistive device: Hand held assist Max distance: 30   Walk 10 feet activity   Assist     Assist level: Moderate Assistance - Patient - 50 - 74% Assistive device: Hand held assist   Walk 50 feet activity   Assist    Assist level: Moderate Assistance - Patient - 50 - 74% Assistive device: Hand held assist    Walk 150 feet activity   Assist Walk 150 feet activity did not occur: Safety/medical concerns         Walk 10 feet on uneven surface  activity   Assist     Assist level: Moderate Assistance - Patient - 50 - 74%     Wheelchair     Assist Is the patient using a wheelchair?: Yes Type of Wheelchair: Manual    Wheelchair assist level: Supervision/Verbal cueing Max wheelchair distance: 150    Wheelchair 50 feet with 2 turns activity    Assist        Assist Level: Supervision/Verbal cueing   Wheelchair 150 feet activity     Assist      Assist Level: Supervision/Verbal cueing   Blood pressure (!) 103/58, pulse 73, temperature 98.2 F (36.8 C), temperature source Oral, resp. rate 17, height '5\' 5"'$  (1.651 m), weight 82.6 kg, last menstrual period 09/22/2022, SpO2 100 %.  Medical Problem List and Plan: 1. 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  Antithrombotics: -DVT/anticoagulation:   Pharmaceutical: Lovenox '40mg'$  QD -antiplatelet therapy: DAPT (ASA '81mg'$  QD/Plavix '75mg'$  QD) for 3 weeks followed by aspirin alone (end date: 10/24/22) 3. Pain Management: Fioricet 1 tab prn for HA  -10/11/22 Kpad ordered for thigh soreness 4. Mood/Behavior/Sleep: LCSW to follow for evaluation and support.              --trazodone prn for chronic insomnia (melatonin not very effective)             -antipsychotic agents: N/A  -wellbutrin '300mg'$  QD 5. Neuropsych/cognition: This patient is capable of making decisions on her own behalf. 6. Skin/Wound Care:  Routine pressure relief measures 7. Fluids/Electrolytes/Nutrition:  Monitor I/O. Stable labs 10/09/22. Monitor weekly  labs starting 10/13/22 8. Hypotension: SBP soft but increasing   Monitor for orthostatic symptoms  -10/12/22 BP stable, monitor Vitals:   10/08/22 1815 10/08/22 1941 10/09/22 0602 10/09/22 2020  BP: 118/75 (!) 114/59 (!) 100/56 109/64   10/10/22 0548 10/10/22 1302 10/10/22 2023 10/11/22 0455  BP: 109/65 110/72 119/71 110/61   10/11/22 1357 10/11/22 1953 10/12/22 0615  BP: 111/72 110/60 (!) 103/58     9. Migraines: Has been managed with use of Topamax '75mg'$  QHS and Imitrex twice a month on average.  10.Transient hypokalemia: Resolved w/ supplement.  Monitor on weekly labs, next 10/13/22    Latest Ref Rng & Units 10/09/2022    6:44 AM 10/08/2022    8:17 AM 10/07/2022    3:36 AM  BMP  Glucose 70 - 99 mg/dL 98  123  95   BUN 6 - 20 mg/dL '7  7  7   '$ Creatinine 0.44 - 1.00 mg/dL 0.64  0.66  0.53   Sodium 135 - 145 mmol/L 138  137  139   Potassium 3.5 - 5.1 mmol/L 3.9  3.8  3.7   Chloride 98 - 111 mmol/L 109  107  109   CO2 22 - 32 mmol/L '21  22  21   '$ Calcium 8.9 - 10.3 mg/dL 9.1  9.1  9.0     11. Low vitamin D level: Ergocalciferol-21.7 on 08/25/22             --resume supplement 50000IU/week 12. Neutropenia: mild stable - cont to monitor weekly, next 10/13/22    Latest Ref Rng & Units 10/09/2022    6:44 AM 10/08/2022    8:17 AM  10/07/2022    3:36 AM  CBC  WBC 4.0 - 10.5 K/uL 3.2  3.8  3.7   Hemoglobin 12.0 - 15.0 g/dL 10.9  11.4  11.1   Hematocrit 36.0 - 46.0 % 33.7  36.6  35.3   Platelets 150 - 400 K/uL 153  171  166     13. Intermittent constipation: No issues so far. Used colace 2x /wk prn at home.  -10/11/22 LBM yesterday, cont regimen (Miralax 17g QD PRN, Senokot-S 1 tab QHS PRN, fleet's PRN, dulcolax supp PRN)-- will add Colace '100mg'$  BID PRN as well 14.  Hyperlipidemia.  Continue Lipitor '20mg'$  QD 15. Hypothyroidism: cont Armour thyroid '180mg'$  QD 16. R calf pain: increasing soreness from 10/11/22 now with pain on 10/12/22; no swelling on exam but will order dopplers of RLE to r/o DVT-- suspect delayed onset muscle soreness from PT, ordered Voltaren gel and muscle rub cream; PT Verl Dicker in with pt, will also try stretching/manual massage once DVT is ruled out. Monitor    LOS: 4 days A FACE TO FACE EVALUATION WAS PERFORMED  [address] [place] 10/12/2022, 11:10 AM

## 2022-10-12 NOTE — Progress Notes (Signed)
Lower extremity venous right study completed.  Preliminary results relayed to [place], [place] and [place] [employer]  See CV Proc for preliminary results report.   Darlin Coco, RDMS, RVT

## 2022-10-12 NOTE — Progress Notes (Signed)
Physical Therapy Session Note  Patient Details  Name: Donna Mccarthy MRN: [id number] Date of Birth: 1976/01/19  Today's Date: 10/12/2022 PT Individual Time: 0900-1000 PT Individual Time Calculation (min): 60 min   Short Term Goals: Week 1:  PT Short Term Goal 1 (Week 1): Pt will perform transfers with supervision assist and no AD PT Short Term Goal 2 (Week 1): Pt will ambulate >126f with supervision assist and LRAD PT Short Term Goal 3 (Week 1): Pt eill ascend 12 steps with CGA and 1 rails PT Short Term Goal 4 (Week 1): Pt will perform Berg balance scale  Skilled Therapeutic Interventions/Progress Updates:      Therapy Documentation Precautions:  Precautions Precautions: Fall Precaution Comments: double vision Restrictions Weight Bearing Restrictions: No   Pt received semi-reclined in bed reporting 5-6/10 R LE (calf pain). Nurse and PA made aware and plan for pt to go for an Ultrasound to rule out DVT.   Pt agreeable to PT session with emphasis on upper extremity strength training 2/2 R LE pain. Pt ambulated with right antalgic gait pattern throughout room with CGA and RW as warm up prior to rounding with PA as warm up and pt reported no change in mobility.   Pt performed 3 x 10 shoulder abduction with 5 lb dumb bell and 1 x 10 tricep extension with 5 lb weight to address UE strength deficits. Pt reports right shoulder pain at middle deltoid that radiates into digits accompanied by N/T. 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 Pt performed median nerve glides 1 x 10 and reports centralization of pain with mobility. PT instructed pt in seated scapular retractions with min tactile and visual cueing and pt performed 1 x 10. Pt also performed "W" stretch in doorframe to address pectoralis flexibility deficits.   Pt left semi-reclined in bed with all needs in reach, bed alarm on, and K pad on for pain relief.   Therapy/Group: Individual  Therapy  SVerl DickerSVerl DickerPT, DPT  10/12/2022, 7:39 AM

## 2022-10-12 NOTE — Progress Notes (Signed)
Occupational Therapy Session Note  Patient Details  Name: Donna Mccarthy MRN: [id number] Date of Birth: 25-Jul-1976  Today's Date: 10/12/2022 OT Individual Time: 1500-1551 OT Individual Time Calculation (min): 51 min    Short Term Goals: Week 1:  OT Short Term Goal 1 (Week 1): Patient will ambulate with device and min A to transition into bathroom. OT Short Term Goal 2 (Week 1): Patient will maintain dynamic standing balance within functional BADL task with CGA OT Short Term Goal 3 (Week 1): Patient will don shoes with set-set-up A.  Skilled Therapeutic Interventions/Progress Updates:  Pt received resting in bed for skilled OT session with focus on toileting and R-hand strengthening. Pt agreeable to interventions, demonstrating overall pleasant mood. Pt with un-rated pain in RLE, stating "it started after therapy yesterday." OT offering intermediate rest breaks and positioning suggestions throughout session to address pain/fatigue and maximize participation/safety in session.   Pt completes bed mobility with supervision + HOB elevated, requiring CGA +RW for all functional transfers. Pt ambulates into bathroom, completing 3/3 toileting activities with CGA + RW/grab bars for standing balance. Pt and OT discuss home bathroom-setup, providing education on DME possibilities such as BSC and TTB to increased independence at home.   Pt dependent for WC transport from room<>day room. In day room, pt participates in series of R-hand strengthening exercises including: -Gross finger flexion -Gross hand and wrist movement (rolling into a cylinder) -Isolated/Gross opposition -Thumb flexion -Finger Adduction -Finger Abduction -Wrist Flexion/Extension  Pt performs all exercises with tan theraputty, completing 5-7 reps of each, requiring multimodal cuing for correct technique.   Pt remained sitting in [facility] with all immediate needs met at end of session. Pt continues to be appropriate for skilled OT  intervention to promote further functional independence.   Therapy Documentation Precautions:  Precautions Precautions: Fall Precaution Comments: double vision Restrictions Weight Bearing Restrictions: No   Therapy/Group: Individual Therapy  Maudie Mercury, OTR/L, MSOT  10/12/2022, 6:27 AM

## 2022-10-13 DIAGNOSIS — I6389 Other cerebral infarction: Secondary | ICD-10-CM | POA: Diagnosis not present

## 2022-10-13 LAB — CBC
HCT: 36 % (ref 36.0–46.0)
Hemoglobin: 11.4 g/dL — ABNORMAL LOW (ref 12.0–15.0)
MCH: 26.2 pg (ref 26.0–34.0)
MCHC: 31.7 g/dL (ref 30.0–36.0)
MCV: 82.8 fL (ref 80.0–100.0)
Platelets: 197 10*3/uL (ref 150–400)
RBC: 4.35 MIL/uL (ref 3.87–5.11)
RDW: 13.6 % (ref 11.5–15.5)
WBC: 3.6 10*3/uL — ABNORMAL LOW (ref 4.0–10.5)
nRBC: 0 % (ref 0.0–0.2)

## 2022-10-13 LAB — BASIC METABOLIC PANEL
Anion gap: 5 (ref 5–15)
BUN: 9 mg/dL (ref 6–20)
CO2: 22 mmol/L (ref 22–32)
Calcium: 9 mg/dL (ref 8.9–10.3)
Chloride: 109 mmol/L (ref 98–111)
Creatinine, Ser: 0.63 mg/dL (ref 0.44–1.00)
GFR, Estimated: >60 mL/min (ref >60)
Glucose, Bld: 136 mg/dL — ABNORMAL HIGH (ref 70–99)
Potassium: 3.6 mmol/L (ref 3.5–5.1)
Sodium: 136 mmol/L (ref 135–145)

## 2022-10-13 NOTE — Progress Notes (Signed)
Occupational Therapy Session Note  Patient Details  Name: Donna Mccarthy MRN: [id number] Date of Birth: 11-06-1975  Today's Date: 10/13/2022 OT Individual Time: [id number] OT Individual Time Calculation (min): 55 min    Short Term Goals: Week 1:  OT Short Term Goal 1 (Week 1): Patient will ambulate with device and min A to transition into bathroom. OT Short Term Goal 2 (Week 1): Patient will maintain dynamic standing balance within functional BADL task with CGA OT Short Term Goal 3 (Week 1): Patient will don shoes with set-set-up A.  Skilled Therapeutic Interventions/Progress Updates:  Pt greeted seated in recliner, pt agreeable to OT intervention. Pt completed functional ambulation with RW down to ortho gym with CGA and chair follow > 200 ft. Pt completed IADL task of collecting ADL items around gym from below knee level with overall CGA with no LOB. Education provided on using reacher as needed as well as RW bag if pt discharges home with RW.   Discussed various IADLS that pt would need to complete at home and provided education on compensatory methods for IADLS such as laundry, light cleaning, and kitchen tasks. Pts biggest IADL obstacles seem to be putting her trash at curb and transporting delivered groceries inside. Pt does think family can bring groceries in.   Pt completed ambulatory transfers in apt to flat [facility] with RW and CGA as well as recliner and TTB.  Pt completed dynamic reaching tasks in kitchen with Rw and CGA with pt reaching for items below knee level with no LOB and unilateral support.               Ended session with pt supine in bed with all needs within reach.            Therapy Documentation Precautions:  Precautions Precautions: Fall Precaution Comments: double vision Restrictions Weight Bearing Restrictions: No  Pain:unrated pain reported in RLE, Voltaren gel provided during session    Therapy/Group: Individual Therapy  Corinne Ports [facility] 10/13/2022,  3:45 PM

## 2022-10-13 NOTE — Progress Notes (Signed)
Physical Therapy Session Note  Patient Details  Name: Donna Mccarthy MRN: [id number] Date of Birth: [birth year]  Today's Date: 10/13/2022 PT Individual Time: 0900-0925 PT Individual Time Calculation (min): 25 min   Short Term Goals: Week 1:  PT Short Term Goal 1 (Week 1): Pt will perform transfers with supervision assist and no AD PT Short Term Goal 2 (Week 1): Pt will ambulate >170f with supervision assist and LRAD PT Short Term Goal 3 (Week 1): Pt eill ascend 12 steps with CGA and 1 rails PT Short Term Goal 4 (Week 1): Pt will perform Berg balance scale  Skilled Therapeutic Interventions/Progress Updates:    Chart reviewed and pt agreeable to therapy. Pt received seated in recliner with no c/o pain. Also of note, pt reported pn from yesterday is not present, but pt still feels some discomfort in R calf. Session focused on amb endurance and activity toleranc eto promote safe home access. Pt initiated session with amb to therapy gym of [id number] supervision + RW. Pt then compelted 5 mins on NuSteps at workload 1. Pt stated feeling in RLE of "blood flowing" behind the knee was increasing, but no pain. Pt received doppler scan yesterday, but pt reports no significant findings. Pt then returned to room with amb of [id number] supervision + RW. At end of session, pt was left seated in recliner with alarm engaged, nurse call bell and all needs in reach.     Therapy Documentation Precautions:  Precautions Precautions: Fall Precaution Comments: double vision Restrictions Weight Bearing Restrictions: No     Therapy/Group: Individual Therapy  [place] OldT, DPT 10/13/2022, 9:28 AM

## 2022-10-13 NOTE — Progress Notes (Signed)
PROGRESS NOTE   Subjective/Complaints:  No new issues RLE pain slightly better, Doppler report reviewed   ROS: +R calf/knee pain, +thigh soreness. Denies fevers, chills, CP, SOB, abd pain, N/V/D/C, new/worsening paresthesias/weakness, or any other complaints at this time.   Objective:   VAS [place] LOWER EXTREMITY VENOUS (DVT)  Result Date: 10/12/2022  Lower Venous DVT Study Patient Name:  Donna Mccarthy  Date of Exam:   10/12/2022 Medical Rec #: [id number]           Accession #:    [id number] Date of Birth: 06-16-1976            Patient Gender: F Patient Age:   47 years Exam Location:  [facility] Procedure:      VAS [place] LOWER EXTREMITY VENOUS (DVT) Referring Phys: [address] --------------------------------------------------------------------------------  Indications: Right posterior knee and calf pain.  Comparison Study: No prior studies. Performing Technologist: Darlin Coco RDMS, RVT  Examination Guidelines: A complete evaluation includes B-mode imaging, spectral Doppler, color Doppler, and power Doppler as needed of all accessible portions of each vessel. Bilateral testing is considered an integral part of a complete examination. Limited examinations for reoccurring indications may be performed as noted. The reflux portion of the exam is performed with the patient in reverse Trendelenburg.  +---------+---------------+---------+-----------+----------+--------------+ RIGHT    CompressibilityPhasicitySpontaneityPropertiesThrombus Aging +---------+---------------+---------+-----------+----------+--------------+ CFV      Full           Yes      Yes                                 +---------+---------------+---------+-----------+----------+--------------+ SFJ      Full                                                        +---------+---------------+---------+-----------+----------+--------------+ FV Prox  Full                                                         +---------+---------------+---------+-----------+----------+--------------+ FV Mid   Full                                                        +---------+---------------+---------+-----------+----------+--------------+ FV DistalFull                                                        +---------+---------------+---------+-----------+----------+--------------+ PFV      Full                                                        +---------+---------------+---------+-----------+----------+--------------+  POP      Full           Yes      Yes                                 +---------+---------------+---------+-----------+----------+--------------+ PTV      Full                                                        +---------+---------------+---------+-----------+----------+--------------+ PERO     Full                                                        +---------+---------------+---------+-----------+----------+--------------+ Gastroc  Full                                                        +---------+---------------+---------+-----------+----------+--------------+   +----+---------------+---------+-----------+----------+--------------+ LEFTCompressibilityPhasicitySpontaneityPropertiesThrombus Aging +----+---------------+---------+-----------+----------+--------------+ CFV Full           Yes      Yes                                 +----+---------------+---------+-----------+----------+--------------+     Summary: RIGHT: - There is no evidence of deep vein thrombosis in the lower extremity.  - No cystic structure found in the popliteal fossa.  LEFT: - No evidence of common femoral vein obstruction.  *See table(s) above for measurements and observations.    Preliminary    Recent Labs    10/13/22 0713  WBC 3.6*  HGB 11.4*  HCT 36.0  PLT 197    Recent Labs    10/13/22 0713  NA 136   K 3.6  CL 109  CO2 22  GLUCOSE 136*  BUN 9  CREATININE 0.63  CALCIUM 9.0     Intake/Output Summary (Last 24 hours) at 10/13/2022 [id number] Last data filed at 10/13/2022 [id number] Gross per 24 hour  Intake 720 ml  Output --  Net 720 ml         Physical Exam: Vital Signs Blood pressure 105/63, pulse 81, temperature 98 F (36.7 C), temperature source Oral, resp. rate 16, height '5\' 5"'$  (1.651 m), weight 82.6 kg, last menstrual period 09/22/2022, SpO2 99 %.   General: No acute distress, sitting in chair with PT Mood and affect are appropriate Heart: Regular rate and rhythm no rubs murmurs or extra sounds Lungs: Clear to auscultation, breathing unlabored, no rales or wheezes Abdomen: Positive bowel sounds, soft nontender to palpation, nondistended Extremities: No clubbing, cyanosis, or edema. Moderate tenderness to  R calf area, nearing the achilles, and mild tenderness in R popliteal fossa area/proximal calf. No swelling or discoloration. No focal knee joint line tenderness.  Good pedal pulse RLE , no skin discoloration  PRIOR EXAM: Skin: No evidence of breakdown, no evidence of rash Neurologic: Cranial nerves II through XII intact, motor strength is 5/5 in Left and 4/5  right  deltoid, bicep, tricep, grip, 5/5 bilateral hip flexor, knee extensors, ankle dorsiflexor and plantar flexor Sensory exam normal sensation to light touch and proprioception in bilateral upper and lower extremities Cerebellar exam normal finger to nose to finger as well as heel to shin in bilateral upper and lower extremities Musculoskeletal: Full range of motion in all 4 extremities. No joint swelling Neuro:  Eyes without evidence of nystagmus  Tone is normal without evidence of spasticity   Cranial nerves II- Visual fields are intact to confrontation testing, no blurring of vision III- no evidence of ptosis, upward, downward and medial gaze intact IV- no vertical diplopia or head tilt V- no facial numbness or  masseter weakness VI- no pupil abduction weakness although pt c/o mild diplopia on lateral gaze bilaterally VII- LEFT facial droop, nl bilateral lid closure VII- normal auditory acuity IX- no pharygeal weakness, X- no pharyngeal weakness, no hoarseness XI- no trap or SCM weakness XII- no glossal weakness    Assessment/Plan: 1. Functional deficits which require 3+ hours per day of interdisciplinary therapy in a comprehensive inpatient rehab setting. Physiatrist is providing close team supervision and 24 hour management of active medical problems listed below. Physiatrist and rehab team continue to assess barriers to discharge/monitor patient progress toward functional and medical goals  Care Tool:  Bathing    Body parts bathed by patient: Right arm, Left arm, Chest, Front perineal area, Abdomen, Buttocks, Left upper leg, Right upper leg, Right lower leg, Face, Left lower leg         Bathing assist Assist Level: Contact Guard/Touching assist     Upper Body Dressing/Undressing Upper body dressing   What is the patient wearing?: Pull over shirt    Upper body assist Assist Level: Supervision/Verbal cueing    Lower Body Dressing/Undressing Lower body dressing      What is the patient wearing?: Underwear/pull up, Pants     Lower body assist Assist for lower body dressing: Minimal Assistance - Patient > 75%     Toileting Toileting    Toileting assist Assist for toileting: Minimal Assistance - Patient > 75%     Transfers Chair/bed transfer  Transfers assist     Chair/bed transfer assist level: Minimal Assistance - Patient > 75%     Locomotion Ambulation   Ambulation assist      Assist level: Moderate Assistance - Patient 50 - 74% Assistive device: Hand held assist Max distance: 30   Walk 10 feet activity   Assist     Assist level: Moderate Assistance - Patient - 50 - 74% Assistive device: Hand held assist   Walk 50 feet activity   Assist     Assist level: Moderate Assistance - Patient - 50 - 74% Assistive device: Hand held assist    Walk 150 feet activity   Assist Walk 150 feet activity did not occur: Safety/medical concerns         Walk 10 feet on uneven surface  activity   Assist     Assist level: Moderate Assistance - Patient - 50 - 74%     Wheelchair     Assist Is the patient using a wheelchair?: Yes Type of Wheelchair: Manual    Wheelchair assist level: Supervision/Verbal cueing Max wheelchair distance: 150    Wheelchair 50 feet with 2 turns activity    Assist        Assist Level: Supervision/Verbal cueing   Wheelchair 150 feet activity     Assist  Assist Level: Supervision/Verbal cueing   Blood pressure 105/63, pulse 81, temperature 98 F (36.7 C), temperature source Oral, resp. rate 16, height '5\' 5"'$  (1.651 m), weight 82.6 kg, last menstrual period 09/22/2022, SpO2 99 %.  Medical Problem List and Plan: 1. 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  Antithrombotics: -DVT/anticoagulation: Doppler negative  Pharmaceutical: Lovenox '40mg'$  QD -antiplatelet therapy: DAPT (ASA '81mg'$  QD/Plavix '75mg'$  QD) for 3 weeks followed by aspirin alone (end date: 10/24/22) 3. Pain Management: Fioricet 1 tab prn for HA  -10/11/22 Kpad ordered for thigh soreness 4. Mood/Behavior/Sleep: LCSW to follow for evaluation and support.              --trazodone prn for chronic insomnia (melatonin not very effective)             -antipsychotic agents: N/A  -wellbutrin '300mg'$  QD 5. Neuropsych/cognition: This patient is capable of making decisions on her own behalf. 6. Skin/Wound Care:  Routine pressure relief measures 7. Fluids/Electrolytes/Nutrition:  Monitor I/O. Stable labs 10/09/22.  Monitor weekly labs starting 10/13/22 8. Hypotension: SBP soft but increasing   Monitor for orthostatic symptoms  -10/13/22 BP stable, monitor Vitals:   10/09/22 0602 10/09/22 2020 10/10/22 0548 10/10/22 1302  BP: (!) 100/56 109/64 109/65 110/72   10/10/22 2023 10/11/22 0455 10/11/22 1357 10/11/22 1953  BP: 119/71 110/61 111/72 110/60   10/12/22 0615 10/12/22 1452 10/12/22 1948 10/13/22 0520  BP: (!) 103/58 111/69 111/69 105/63     9. Migraines: Has been managed with use of Topamax '75mg'$  QHS and Imitrex twice a month on average.  10.Transient hypokalemia: Resolved w/ supplement.  Monitor on weekly labs, next 10/13/22    Latest Ref Rng & Units 10/13/2022    7:13 AM 10/09/2022    6:44 AM 10/08/2022    8:17 AM  BMP  Glucose 70 - 99 mg/dL 136  98  123   BUN 6 - 20 mg/dL '9  7  7   '$ Creatinine 0.44 - 1.00 mg/dL 0.63  0.64  0.66   Sodium 135 - 145 mmol/L 136  138  137   Potassium 3.5 - 5.1 mmol/L 3.6  3.9  3.8   Chloride 98 - 111 mmol/L 109  109  107   CO2 22 - 32 mmol/L '22  21  22   '$ Calcium 8.9 - 10.3 mg/dL 9.0  9.1  9.1     11. Low vitamin D level: Ergocalciferol-21.7 on 08/25/22             --resume supplement 50000IU/week 12. Neutropenia: mild stable - cont to monitor weekly, next 10/13/22    Latest Ref Rng & Units 10/13/2022    7:13 AM 10/09/2022    6:44 AM 10/08/2022    8:17 AM  CBC  WBC 4.0 - 10.5 K/uL 3.6  3.2  3.8   Hemoglobin 12.0 - 15.0 g/dL 11.4  10.9  11.4   Hematocrit 36.0 - 46.0 % 36.0  33.7  36.6   Platelets 150 - 400 K/uL 197  153  171     13. Intermittent constipation: No issues so far. Used colace 2x /  wk prn at home.  -10/11/22 LBM yesterday, cont regimen (Miralax 17g QD PRN, Senokot-S 1 tab QHS PRN, fleet's PRN, dulcolax supp PRN)-- will add Colace '100mg'$  BID PRN as well 14.  Hyperlipidemia.  Continue Lipitor '20mg'$  QD 15. Hypothyroidism: cont Armour thyroid '180mg'$  QD 16. R calf pain: increasing soreness from 10/11/22 now with pain on 10/12/22; no swelling on exam but will  order dopplers of RLE to r/o DVT-- suspect delayed onset muscle soreness from PT, ordered Voltaren gel and muscle rub cream; also using heat K pad with relief     LOS: 5 days A FACE TO FACE EVALUATION WAS PERFORMED  Charlett Blake 10/13/2022, 9:21 AM

## 2022-10-13 NOTE — Progress Notes (Signed)
Physical Therapy Session Note  Patient Details  Name: Donna Mccarthy MRN: [id number] Date of Birth: 03/07/1976  Today's Date: 10/13/2022 PT Individual Time: 1003-1100 PT Individual Time Calculation (min): 57 min   Short Term Goals: Week 1:  PT Short Term Goal 1 (Week 1): Pt will perform transfers with supervision assist and no AD PT Short Term Goal 2 (Week 1): Pt will ambulate >148f with supervision assist and LRAD PT Short Term Goal 3 (Week 1): Pt eill ascend 12 steps with CGA and 1 rails PT Short Term Goal 4 (Week 1): Pt will perform Berg balance scale  Skilled Therapeutic Interventions/Progress Updates:    Pt received sitting upright in recliner chair and agreeable to PT. Pt expressed that she was in a 2/10 pain in R quad and R calf. Pain increased w/ knee extension and plantarflexion. Pt verbalized understanding that pain was in part to a muscle strain and to allow for time and the use of movement and hot pack modality to decrease discomfort.   Gait training: ~1582fx2 w/ RW and supervision A to and from therapy gym. Pt demonstrated decreased step length and velocity in part to acute RLE pain. Vc for pt to focus wt shifting to the L to facilitate increased R foot clearance was demonstrated well by pt.   NMR: Sit to stands w/ toe taps on to numbered dots w/ CGA/supervision.  Pt instructed to stand up and tap dots called out by therapist that were arranged in various potions around pt, then sit back down and repeat 5 times leading w/ each foot. Pt performed task 2x5 on each LE. Therapist instructed pt to focus on the motor planning of transitioning to stand and the sequence of the task. Mod Vc to bring shoulders forward to facilitate anterior wt shift then power up to standing. Pt struggled to step across midline anteriorly w/ L foot. LOB posteriorly, but was able to recover and therapist there to provide CGA. Therapist cued pt to transfer her wt onto the RLE to give her more stability when  tapping w/ the L foot. Pt expressed that increased time on RLE increased pain to a 3/10. Therapist put hot pack on pt's anterior thigh and instructed pt to perform 2x8 heel slides w/ calf stretch seated. Pt expressed after last set of heel slides that the movement felt easier and was less painful.  Pt returned to recliner chair w/ supervision and left w/ Kpad wrapped around R thigh, chair alarm on, call bell in reach and all need met.   Therapy Documentation Precautions:  Precautions Precautions: Fall Precaution Comments: double vision Restrictions Weight Bearing Restrictions: No General:        Therapy/Group: Individual Therapy  Courtland Coppa 10/13/2022, 4:45 PM

## 2022-10-13 NOTE — Progress Notes (Signed)
Occupational Therapy Session Note  Patient Details  Name: Donna Mccarthy MRN: [id number] Date of Birth: 11/25/1975  Today's Date: 10/13/2022 OT Individual Time: 1300-1330 OT Individual Time Calculation (min): 30 min    Short Term Goals: Week 1:  OT Short Term Goal 1 (Week 1): Patient will ambulate with device and min A to transition into bathroom. OT Short Term Goal 2 (Week 1): Patient will maintain dynamic standing balance within functional BADL task with CGA OT Short Term Goal 3 (Week 1): Patient will don shoes with set-set-up A.  Skilled Therapeutic Interventions/Progress Updates:    Pt seen this session to reassess vision/diplopia.  Occlusion tape removed from her glasses and pt did not have any diplopia in any range, was able to scan, complete saccades, converge eyes with no difficulty.  Focused on balance and postural awareness control. Pt worked on sit to stands from [employer] with no UE support but once standing she had a posterior lean. Spent considerable time working on ant/posterior weight shift to help pt find midline.  She did have a fear of falling forward so had pt face a wall and she worked on rocking feet on heels and then toes using the wall for reference. Pt did very well with this exercise.  She then was able to hold her balance and do sit to stands maintaining balance without wall for support.  For her RUE, pt worked on wall slides for a long stretch through her arm and for shoulder strength.   Pt participated well, resting in recliner with all needs met.    Therapy Documentation Precautions:  Precautions Precautions: Fall Precaution Comments: double vision Restrictions Weight Bearing Restrictions: No Pain: Pain Assessment Pain Score: 0-No pain    Therapy/Group: Individual Therapy  [place] 10/13/2022, 3:57 PM

## 2022-10-13 NOTE — Progress Notes (Signed)
Speech Language Pathology Daily Session Note  Patient Details  Name: Donna Mccarthy MRN: [id number] Date of Birth: 06-22-1976  Today's Date: 10/13/2022 SLP Individual Time: [id number] SLP Individual Time Calculation (min): 26 min  Short Term Goals: Week 1: SLP Short Term Goal 1 (Week 1): Patient will utilize memory compensations to recall novel information with sup A verbal cues SLP Short Term Goal 2 (Week 1): Patient will complete problem solving pertaining to iADLs (e.g., medication/money management, with min A verbal cues SLP Short Term Goal 3 (Week 1): Patient will complete complex scheduling and organizing tasks with min A verbal cues SLP Short Term Goal 4 (Week 1): Patient will verbalize recall and utilize word finding strategies during structured and/or unstructured cognitive-linguistic/language tasks with sup A verbal cues  Skilled Therapeutic Interventions: Skilled ST treatment focused on cognitive-communication goals. Pt was greeted at sink while washing hands and face with sup A from nurse. Pt ambulated to recliner using RW and CGA. Pt reported ways in which she has implemented memory strategies since last ST session, involving using memory notebook to write down daily to-do lists. SLP provided education regarding cognitive-linguistic deficits and the spoon theory to discuss cognitive load. SLP facilitated a working memory, sequencing, and verbal reasoning task with overall sup A verbal cues for recall of sequence. Pt exhibited no evidence of word finding difficulty throughout session. SLP facilitated functional recall of recent events from therapy with mod I, pt was able to recall therapists names with mod I-to-sup A semantic cues. Pt exhibited excellent insight into current physical and cognitive changes s/p CVA and excellent anticipatory problem solving skills as she discussed her abilities within current environment, and as she thinks about discharging to home environment. Pt  demonstrating overall great carry over of ST interventions and remains highly motivated. Patient was left in recliner with alarm activated and immediate needs within reach at end of session. Continue per current plan of care.      Pain Pain Assessment Pain Scale: 0-10 Pain Score: 0-No pain  Therapy/Group: Individual Therapy  Patty Sermons 10/13/2022, 9:28 AM

## 2022-10-14 DIAGNOSIS — I6389 Other cerebral infarction: Secondary | ICD-10-CM | POA: Diagnosis not present

## 2022-10-14 DIAGNOSIS — F411 Generalized anxiety disorder: Secondary | ICD-10-CM

## 2022-10-14 DIAGNOSIS — F4001 Agoraphobia with panic disorder: Secondary | ICD-10-CM

## 2022-10-14 NOTE — Progress Notes (Signed)
Physical Therapy Session Note  Patient Details  Name: Donna Mccarthy MRN: [id number] Date of Birth: [birth year]  Today's Date: 10/14/2022 PT Individual Time: 1335-1445 PT Individual Time Calculation (min): 70 min   Short Term Goals: Week 1:  PT Short Term Goal 1 (Week 1): Pt will perform transfers with supervision assist and no AD PT Short Term Goal 2 (Week 1): Pt will ambulate >110f with supervision assist and LRAD PT Short Term Goal 3 (Week 1): Pt eill ascend 12 steps with CGA and 1 rails PT Short Term Goal 4 (Week 1): Pt will perform Berg balance scale  Skilled Therapeutic Interventions/Progress Updates:    Pt recived sitting upright in recliner chair and agreeable to therapy. Pt performed sit to stand transfer w/ supervision and ambulated w/ RW to therapy gym w/ supervision. Therapist noticed decreased antalgic gait, seemingly in part to pt denying any pain in RLE.   Sit to stands x15 w/ no AD. Pt tends to raise toes when transitioning to stand and brings trunk back resulting in COB being posterior. Vc to keep B feet down and bring shoulders more anteriorly to allow for wt shift and facilitate power up.  Gait training: ~2071fw/ RW to the therapy gym and supervision A. Vc for pt to emphasize heel toe gait pattern. In therapy gym, pt performed ~10051f/o RW, w/ CGA. The pt continued to participate in ~[id number] w/ no AD and CGA. Pt was overcome w/ joy at the fact that she was walking w/o RW. Pt demonstrated mild decreased wt shifting to the R during gait and required mod vc for heel toe pattern. Walking backwards ~25ft 55fo AD and CGA. Pt demonstrated a functional toe-heel pattern. Therapist encouraged pt's confidence throughout gait training, which increased pt's self-efficacy.   Pt engaged in LE strengthening and balance exercises. Performed standing stair taps at 2" stairs. Pt instructed to tap 2-3 stairs ascending and descending while balancing on stance limb. Pt performed  task 3x5 taps on both LE. Each new trial pt was instructed to use less UE support. By trial 3, Pt performed task w/ no UE support and instructed to tap 2 stairs while balancing on stance limb. Each trial performed w/ CGA.   3 way 6" hurdle stepping forwards, backwards, and laterally. Pt performed task 3x in each direction, each w/ CGA. Pt cued to wt shift on to stance limb then step w/ advancing limb to ensure no LOB and emphasize motor control. When stepping backwards leading w/ the RLE pt struggled to maintain balance at first. When therapist cued pt to shift wt on to L foot then step back pt was able to demonstrate improved motor control. Nu-step 5 min, 2 min w/ instruction to fully extend LE to get a good stretch throughout LEs. Therapist noticed increased hip IR. 2 min trial done w/ L hip abduction isometric using therapist's hand as the force against pt's knee.   Pt ambulated back to room ~150 ft w/ no RW and CGA. Left sitting in recliner w/ chair alarm on, call bell in reach, and all needs met.   Therapy Documentation Precautions:  Precautions Precautions: Fall Precaution Comments: double vision Restrictions Weight Bearing Restrictions: No General:   Pain: pt denies any pain, continues to use Kpad on R quad and calf when needed      Therapy/Group: Individual Therapy  Ishia Tenorio 10/14/2022, 5:04 PM
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
## 2022-10-14 NOTE — Progress Notes (Signed)
Speech Language Pathology Daily Session Note  Patient Details  Name: Donna Mccarthy MRN: [id number] Date of Birth: March 25, 1976  Today's Date: 10/14/2022 SLP Individual Time: [id number] SLP Individual Time Calculation (min): 60 min  Short Term Goals: Week 1: SLP Short Term Goal 1 (Week 1): Patient will utilize memory compensations to recall novel information with sup A verbal cues SLP Short Term Goal 2 (Week 1): Patient will complete problem solving pertaining to iADLs (e.g., medication/money management, with min A verbal cues SLP Short Term Goal 3 (Week 1): Patient will complete complex scheduling and organizing tasks with min A verbal cues SLP Short Term Goal 4 (Week 1): Patient will verbalize recall and utilize word finding strategies during structured and/or unstructured cognitive-linguistic/language tasks with sup A verbal cues  Skilled Therapeutic Interventions:  Pt seen for skilled SLP session to address functional cognitive goals regarding verbal sequencing, attention, working memory, organization, and problem solving. Pt benefited from short hand notes to complete verbal sequencing task (f=4) to recall all steps and then put them in order. Pt was able to complete  task with 100% accuracy given min cues and use of this strategy. Pt completed grocery list task of identifying items/prices, and totaling amount with moderate cues to correctly figure total. She completed organizational calendar/scheduling task with 100% accuracy independently. Pt reported that more in-depth scheduling tasks and higher level attention tasks would be beneficial for her to work on. Pt left sitting up in recliner with all needs in reach. Chair alarm activated. Call bell within reach. Continue SLP PoC.   Pain Pain Assessment Pain Scale: 0-10 Pain Score: 0-No pain  Therapy/Group: Individual Therapy  Wyn Forster 10/14/2022, 11:46 AM

## 2022-10-14 NOTE — Progress Notes (Signed)
Occupational Therapy Session Note  Patient Details  Name: Donna Mccarthy MRN: [id number] Date of Birth: 1976-01-09  Today's Date: 10/14/2022 OT Individual Time: [id number] OT Individual Time Calculation (min): 75 min    Short Term Goals: Week 1:  OT Short Term Goal 1 (Week 1): Patient will ambulate with device and min A to transition into bathroom. OT Short Term Goal 2 (Week 1): Patient will maintain dynamic standing balance within functional BADL task with CGA OT Short Term Goal 3 (Week 1): Patient will don shoes with set-set-up A.  Skilled Therapeutic Interventions/Progress Updates:    Pt received in bed agreeable to therapy.  She sat to EOB with supervision but with sit to stands continues to have a hip thrust which shifts her to a posterior lean.  From EOB worked on controlled sit to stands with pt still needing cues and light CGA. Once in standing, she was able to use RW with close S to light CGA as she worked on reaching into closet, even practicing to pull a heavy bag down with B hands. Also worked on reaching to floor level with cues for safe techniques using wide stance and one hand to stabilize herself.   She ambulated to bathroom to toilet and shower. She practiced standing for part of the shower to wash bottom area.  Returned to EOB to dress with set up.   Stood at sink to brush teeth with supervision.  Ambulated to gym with RW with close S.  In gym worked on controlled sit to stands using a 2lb dowel bar to guide her movement patterns as she moved bar to knees then upper thighs as she stood. This helped her achieve a fluid transition. Pt able to complete several sit to stands 10 x3 all with close S using excellent technique.    In sitting completed R shoulder strengthening using bar in B hands for chest and overhead presses.  She also worked on long lateral reaches with bar overhead for trunk control and balance.    R FMC with small peg board following a design and was able to  remove pegs with tweezers.   Pt then ambulated back to room to sit in recliner.  Overall she has much improved postural control and midline awareness.  Speech therapist arrived for her next session.   Therapy Documentation Precautions:  Precautions Precautions: Fall Precaution Comments: double vision Restrictions Weight Bearing Restrictions: No   Pain: Pain Assessment Pain Score: 0-No pain ADL: ADL Eating: Independent Grooming: Supervision/safety Where Assessed-Grooming: Standing at sink Upper Body Bathing: Setup Where Assessed-Upper Body Bathing: Shower Lower Body Bathing: Setup, Supervision/safety Where Assessed-Lower Body Bathing: Shower Upper Body Dressing: Independent Where Assessed-Upper Body Dressing: Edge of bed Lower Body Dressing: Supervision/safety Where Assessed-Lower Body Dressing: Edge of bed Toileting: Supervision/safety Where Assessed-Toileting: Glass blower/designer: Therapist, music Method: Magazine features editor: Curator Method: Heritage manager: Shower seat with back    Therapy/Group: Individual Therapy  [place] 10/14/2022, 11:21 AM

## 2022-10-14 NOTE — Progress Notes (Addendum)
PROGRESS NOTE   Subjective/Complaints:   RLE pain much  better after stretching with PT, Doppler report reviewed   Double vision resolved   ROS:  Denies fevers, chills, CP, SOB, abd pain, N/V/D/C, new/worsening paresthesias/weakness, or any other complaints at this time.   Objective:   VAS [place] LOWER EXTREMITY VENOUS (DVT)  Result Date: 10/13/2022  Lower Venous DVT Study Patient Name:  Donna Mccarthy  Date of Exam:   10/12/2022 Medical Rec #: [id number]           Accession #:    [id number] Date of Birth: January 28, 1976            Patient Gender: F Patient Age:   47 years Exam Location:  [facility] Procedure:      VAS [place] LOWER EXTREMITY VENOUS (DVT) Referring Phys: [address] --------------------------------------------------------------------------------  Indications: Right posterior knee and calf pain.  Comparison Study: No prior studies. Performing Technologist: Darlin Coco RDMS, RVT  Examination Guidelines: A complete evaluation includes B-mode imaging, spectral Doppler, color Doppler, and power Doppler as needed of all accessible portions of each vessel. Bilateral testing is considered an integral part of a complete examination. Limited examinations for reoccurring indications may be performed as noted. The reflux portion of the exam is performed with the patient in reverse Trendelenburg.  +---------+---------------+---------+-----------+----------+--------------+ RIGHT    CompressibilityPhasicitySpontaneityPropertiesThrombus Aging +---------+---------------+---------+-----------+----------+--------------+ CFV      Full           Yes      Yes                                 +---------+---------------+---------+-----------+----------+--------------+ SFJ      Full                                                        +---------+---------------+---------+-----------+----------+--------------+ FV Prox  Full                                                         +---------+---------------+---------+-----------+----------+--------------+ FV Mid   Full                                                        +---------+---------------+---------+-----------+----------+--------------+ FV DistalFull                                                        +---------+---------------+---------+-----------+----------+--------------+ PFV      Full                                                        +---------+---------------+---------+-----------+----------+--------------+  POP      Full           Yes      Yes                                 +---------+---------------+---------+-----------+----------+--------------+ PTV      Full                                                        +---------+---------------+---------+-----------+----------+--------------+ PERO     Full                                                        +---------+---------------+---------+-----------+----------+--------------+ Gastroc  Full                                                        +---------+---------------+---------+-----------+----------+--------------+   +----+---------------+---------+-----------+----------+--------------+ LEFTCompressibilityPhasicitySpontaneityPropertiesThrombus Aging +----+---------------+---------+-----------+----------+--------------+ CFV Full           Yes      Yes                                 +----+---------------+---------+-----------+----------+--------------+     Summary: RIGHT: - There is no evidence of deep vein thrombosis in the lower extremity.  - No cystic structure found in the popliteal fossa.  LEFT: - No evidence of common femoral vein obstruction.  *See table(s) above for measurements and observations. Electronically signed by Harold Barban MD on 10/13/2022 at 4:00:13 PM.    Final    Recent Labs    10/13/22 0713  WBC 3.6*  HGB 11.4*   HCT 36.0  PLT 197     Recent Labs    10/13/22 0713  NA 136  K 3.6  CL 109  CO2 22  GLUCOSE 136*  BUN 9  CREATININE 0.63  CALCIUM 9.0      Intake/Output Summary (Last 24 hours) at 10/14/2022 0831 Last data filed at 10/14/2022 0818 Gross per 24 hour  Intake 712 ml  Output --  Net 712 ml         Physical Exam: Vital Signs Blood pressure 108/63, pulse 98, temperature 98.1 F (36.7 C), temperature source Oral, resp. rate 16, height '5\' 5"'$  (1.651 m), weight 82.6 kg, last menstrual period 09/22/2022, SpO2 100 %.   General: No acute distress, sitting in chair with PT Mood and affect are appropriate Heart: Regular rate and rhythm no rubs murmurs or extra sounds Lungs: Clear to auscultation, breathing unlabored, no rales or wheezes Abdomen: Positive bowel sounds, soft nontender to palpation, nondistended Extremities: No clubbing, cyanosis, or edema. No tenderness in RIght quad or R calf today  Good pedal pulse RLE , no skin discoloration  PRIOR EXAM: Skin: No evidence of breakdown, no evidence of rash Neurologic:  motor strength is 5/5 in Left and 4/5 right  deltoid, bicep, tricep, grip, 5/5 bilateral hip flexor, knee extensors,  ankle dorsiflexor and plantar flexor Sensory exam normal sensation to light touch and proprioception in bilateral upper and lower extremities Cerebellar exam normal finger to nose to finger as well as heel to shin in bilateral upper and lower extremities Musculoskeletal: Full range of motion in all 4 extremities. No joint swelling Neuro:  Eyes without evidence of nystagmus  Tone is normal without evidence of spasticity   Cranial nerves II- Visual fields are intact to confrontation testing, no blurring of vision III- no evidence of ptosis, upward, downward and medial gaze intact IV- no vertical diplopia or head tilt V- no facial numbness or masseter weakness VI- no pupil abduction weakness VII- no facial droop, good lid closure VII- normal  auditory acuity IX- no pharygeal weakness, gag nl X- no pharyngeal weakness, no hoarseness XI- no trap or SCM weakness XII- no glossal weakness    Assessment/Plan: 1. Functional deficits which require 3+ hours per day of interdisciplinary therapy in a comprehensive inpatient rehab setting. Physiatrist is providing close team supervision and 24 hour management of active medical problems listed below. Physiatrist and rehab team continue to assess barriers to discharge/monitor patient progress toward functional and medical goals  Care Tool:  Bathing    Body parts bathed by patient: Right arm, Left arm, Chest, Front perineal area, Abdomen, Buttocks, Left upper leg, Right upper leg, Right lower leg, Face, Left lower leg         Bathing assist Assist Level: Contact Guard/Touching assist     Upper Body Dressing/Undressing Upper body dressing   What is the patient wearing?: Pull over shirt    Upper body assist Assist Level: Supervision/Verbal cueing    Lower Body Dressing/Undressing Lower body dressing      What is the patient wearing?: Underwear/pull up, Pants     Lower body assist Assist for lower body dressing: Minimal Assistance - Patient > 75%     Toileting Toileting    Toileting assist Assist for toileting: Minimal Assistance - Patient > 75%     Transfers Chair/bed transfer  Transfers assist     Chair/bed transfer assist level: Minimal Assistance - Patient > 75%     Locomotion Ambulation   Ambulation assist      Assist level: Moderate Assistance - Patient 50 - 74% Assistive device: Hand held assist Max distance: 30   Walk 10 feet activity   Assist     Assist level: Moderate Assistance - Patient - 50 - 74% Assistive device: Hand held assist   Walk 50 feet activity   Assist    Assist level: Moderate Assistance - Patient - 50 - 74% Assistive device: Hand held assist    Walk 150 feet activity   Assist Walk 150 feet activity did not  occur: Safety/medical concerns         Walk 10 feet on uneven surface  activity   Assist     Assist level: Moderate Assistance - Patient - 50 - 74%     Wheelchair     Assist Is the patient using a wheelchair?: Yes Type of Wheelchair: Manual    Wheelchair assist level: Supervision/Verbal cueing Max wheelchair distance: 150    Wheelchair 50 feet with 2 turns activity    Assist        Assist Level: Supervision/Verbal cueing   Wheelchair 150 feet activity     Assist      Assist Level: Supervision/Verbal cueing   Blood pressure 108/63, pulse 98, temperature 98.1 F (36.7 C), temperature source Oral,  resp. rate 16, height '5\' 5"'$  (1.651 m), weight 82.6 kg, last menstrual period 09/22/2022, SpO2 100 %.  Medical Problem List and Plan: 1. 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  Antithrombotics: -DVT/anticoagulation: Doppler negative  Pharmaceutical: Lovenox '40mg'$  QD -antiplatelet therapy: DAPT (ASA '81mg'$  QD/Plavix '75mg'$  QD) for 3 weeks followed by aspirin alone (end date: 10/24/22) 3. Pain Management: Fioricet 1 tab prn for HA  -10/11/22 Kpad ordered for thigh soreness 4. Mood/Behavior/Sleep: LCSW to follow for evaluation and support.              --trazodone prn for chronic insomnia (melatonin not very effective)             -antipsychotic agents: N/A  -wellbutrin '300mg'$  QD 5. Neuropsych/cognition: This patient is capable of making decisions on her own behalf. 6. Skin/Wound Care:  Routine pressure relief measures 7. Fluids/Electrolytes/Nutrition:  Monitor I/O. Stable labs 10/09/22. Monitor weekly labs starting 10/13/22 8. Hypotension: SBP soft but increasing   Monitor for orthostatic symptoms  -10/14/22 BP stable,  monitor Vitals:   10/10/22 0548 10/10/22 1302 10/10/22 2023 10/11/22 0455  BP: 109/65 110/72 119/71 110/61   10/11/22 1357 10/11/22 1953 10/12/22 0615 10/12/22 1452  BP: 111/72 110/60 (!) 103/58 111/69   10/12/22 1948 10/13/22 0520 10/13/22 1347 10/13/22 1932  BP: 111/69 105/63 114/73 108/63     9. Migraines: Has been managed with use of Topamax '75mg'$  QHS and Imitrex twice a month on average.  10.Transient hypokalemia: Resolved w/ supplement.  Monitor on weekly labs, next 10/13/22    Latest Ref Rng & Units 10/13/2022    7:13 AM 10/09/2022    6:44 AM 10/08/2022    8:17 AM  BMP  Glucose 70 - 99 mg/dL 136  98  123   BUN 6 - 20 mg/dL '9  7  7   '$ Creatinine 0.44 - 1.00 mg/dL 0.63  0.64  0.66   Sodium 135 - 145 mmol/L 136  138  137   Potassium 3.5 - 5.1 mmol/L 3.6  3.9  3.8   Chloride 98 - 111 mmol/L 109  109  107   CO2 22 - 32 mmol/L '22  21  22   '$ Calcium 8.9 - 10.3 mg/dL 9.0  9.1  9.1     11. Low vitamin D level: Ergocalciferol-21.7 on 08/25/22             --resume supplement 50000IU/week 12. Neutropenia: mild stable - cont to monitor weekly, stable on  10/13/22    Latest Ref Rng & Units 10/13/2022    7:13 AM 10/09/2022    6:44 AM 10/08/2022    8:17 AM  CBC  WBC 4.0 - 10.5 K/uL 3.6  3.2  3.8   Hemoglobin 12.0 - 15.0 g/dL 11.4  10.9  11.4   Hematocrit 36.0 - 46.0 % 36.0  33.7  36.6   Platelets 150 - 400 K/uL 197  153  171     13. Intermittent constipation: No issues so far. Used colace 2x /wk prn at home.  -10/11/22 LBM yesterday, cont regimen (Miralax  17g QD PRN, Senokot-S 1 tab QHS PRN, fleet's PRN, dulcolax supp PRN)-- will add Colace '100mg'$  BID PRN as well 14.  Hyperlipidemia.  Continue Lipitor '20mg'$  QD 15. Hypothyroidism: cont Armour thyroid '180mg'$  QD 16. R calf pain: doppler negative, improved after stretch, pt with delayed onset muscle soreness resolving     LOS: 6 days A FACE TO FACE EVALUATION WAS PERFORMED  Charlett Blake 10/14/2022, 8:31 AM

## 2022-10-15 NOTE — Progress Notes (Signed)
Physical Therapy Session Note  Patient Details  Name: Donna Mccarthy MRN: [id number] Date of Birth: [birth year]  Today's Date: 10/15/2022 PT Individual Time: [id number] PT Individual Time Calculation (min): 55 min   Short Term Goals: Week 1:  PT Short Term Goal 1 (Week 1): Pt will perform transfers with supervision assist and no AD PT Short Term Goal 2 (Week 1): Pt will ambulate >166f with supervision assist and LRAD PT Short Term Goal 3 (Week 1): Pt eill ascend 12 steps with CGA and 1 rails PT Short Term Goal 4 (Week 1): Pt will perform Berg balance scale  Skilled Therapeutic Interventions/Progress Updates:    Pt received supine in bed and agreeable to PT. Pt transferred to standing w/ supervision assist and no AD.   Gait training: ~2047fx2 w/ CGA/supervision A and no AD. Cueing for pt to maintain heel toe pattern and to look up when ambulating to encourage self-efficacy.   NMR:  Sit to stand 1x10 w/ foam under L foot to encourage increased R wb. 1x10 w/ no foam. Pt demonstrated sustained wb through RLE and even wt shift. Pt performed forward, backwards and lateral side lunge wt shifts to encourage wt shifting and balance w/ CGA. Performed lateral walks w/ no band 3029f/ CGA. Vc to step to foot before stepping laterally and to shift weight and find COB before taking next step. Monster walks 36f24f w/ CGA. last set pt performed backwards. Vc for pt to maintain low stance to challenge LE strength. Pt somewhat fearful when going backwards, but with encouragement was able to complete the task. Performed 4 way cone taps w/ colored cones arranged in a 3x3 diamond. Pt instructed to tap colored cone called out by therapist. Pt engaged in task for ~3 minutes non-stop. When leading w/ the L foot pt demonstrated minor postural sway, but was able to maintain balance. Wobble board balancing inside parallel bars w/ close supervision assist. 1 minute side to side wt shifts, then 2 minutes balancing in the  middle. Pt experienced muscle fatigue throughout RLE, but denied any pain.   Pt ambulated back to room w/ supervision assist and was left sitting in recliner chair w/ alarm set, call bell in reach and all needs met.  Therapy Documentation Precautions:  Precautions Precautions: Fall Precaution Comments: double vision Restrictions Weight Bearing Restrictions: No General:   Pain: denies any pain throughout session   Therapy/Group: Individual Therapy  [name] 10/15/2022, 10:17 AM

## 2022-10-15 NOTE — Progress Notes (Signed)
Occupational Therapy Session Note  Patient Details  Name: Donna Mccarthy MRN: [id number] Date of Birth: 1976-08-16  Today's Date: 10/15/2022 OT Individual Time: [id number] OT Individual Time Calculation (min): 43 min    Short Term Goals: Week 1:  OT Short Term Goal 1 (Week 1): Patient will ambulate with device and min A to transition into bathroom. OT Short Term Goal 2 (Week 1): Patient will maintain dynamic standing balance within functional BADL task with CGA OT Short Term Goal 3 (Week 1): Patient will don shoes with set-set-up A.  Skilled Therapeutic Interventions/Progress Updates:  Pt greeted seated in recliner, pt agreeable to OT intervention. Pt completed functional ambulation from room >gym with no AD and supervision. Pt completed below UE assessments to further assess RUE coordination:   9 Hole Peg Test is used to measure finger dexterity in pts with various neurological diagnoses. - Instructions The pt was instructed to pick up the pegs one at a time, using their dominant hand first and put them into the holes in any order until the holes were all filled. The pt then removed the pegs one at a time and returned them to the container. Both hands were tested separately.  - Results The pt completed the test in 26 seconds ( RUE), 24 secs, ( LUE). Scores are based on the time taken to complete the activity. The timer started the moment the pt touched the first peg until the moment the last peg hit the container. The Dynamometer Grip Strength Test is a quantitative and objective measure of isometric muscular strength of the hand and forearm.  -Instructions The patient was asked to sit with their back, pelvis, and knees at 90 degrees. The shoulder was adducted and neutrally rotated with The elbow flexed to 90 degrees and forearm in neutral. The arm was not supported.  -Results The score was determined by calculating the average of 3 trials. The pt's average score was 55  lbs in the R hand and 53lbs in the L hand.    Box and Blocks Test measures unilateral gross manual dexterity. - Instructions The pt was instructed to carry one block over at a time and go as quickly as they could, making sure their fingertips crossed the partition. One minute was given to complete the task per UE. The pt was allowed a 15-second trial period prior to testing if needed. - Results The pt transferred 44 blocks with the R hand and 47 with the L hand. The total number of blocks carried from one compartment to the other in one minute is scored per hand. Higher scores on the test indicate better gross manual dexterity.   Pt completed IADL task where pt instructed to transport towels in laundry basket from gym>laundry room with basket weighing ~ 8 lbs, pt completed task with no AD and supervision. Pt able to place laundry items in dryer/washer with supervision, pt demo'ed ability to lift washing detergent with RUE.   Pt able to stand on compliant cushion to fold towels to challenge dynamic standing balance with no AD and supervision, no LOB.pt completed additional dynamic balance task with pt able to ambulate while holding weighted ball balanced on top of cone to challenge balance, proprioception, functional gait, attention and coordination. Pt completed task with supervision with no LOB or dropping of any items. Graded task up and had pt ambulate while holding 2 weighted balls balanced on top of cone.   Pt completed ambulatory toilet transfer during session with no AD with  supervision, supervision for 3/3 toileting tasks.   Ended session with pt seated in recliner with all needs within reach.   Therapy Documentation Precautions:  Precautions Precautions: Fall Precaution Comments: double vision Restrictions Weight Bearing Restrictions: No    Pain: no pain reported     Therapy/Group: Individual Therapy  Precious Haws 10/15/2022, 8:17 PM

## 2022-10-15 NOTE — Progress Notes (Signed)
PROGRESS NOTE   Subjective/Complaints:  Appreciate neuropsych note  Pt is up inroom walking without device with CNA supervision   Discussed return to work and driving (both are pt goals )   Double vision resolved   ROS:  Denies fevers, chills, CP, SOB, abd pain, N/V/D/C,  Objective:   No results found. Recent Labs    10/13/22 0713  WBC 3.6*  HGB 11.4*  HCT 36.0  PLT 197     Recent Labs    10/13/22 0713  NA 136  K 3.6  CL 109  CO2 22  GLUCOSE 136*  BUN 9  CREATININE 0.63  CALCIUM 9.0      Intake/Output Summary (Last 24 hours) at 10/15/2022 [id number] Last data filed at 10/15/2022 0700 Gross per 24 hour  Intake 531 ml  Output --  Net 531 ml         Physical Exam: Vital Signs Blood pressure 109/65, pulse 73, temperature 97.8 F (36.6 C), temperature source Oral, resp. rate 17, height '5\' 5"'$  (1.651 m), weight 82.6 kg, last menstrual period 09/22/2022, SpO2 100 %.   General: No acute distress, sitting in chair with PT Mood and affect are appropriate Heart: Regular rate and rhythm no rubs murmurs or extra sounds Lungs: Clear to auscultation, breathing unlabored, no rales or wheezes Abdomen: Positive bowel sounds, soft nontender to palpation, nondistended Extremities: No clubbing, cyanosis, or edema. No tenderness in RIght quad or R calf today  Good pedal pulse RLE , no skin discoloration  5/5 in BUE and LLE 4- Right HF, KE, ADF (?full effort)  Sensation reduce LT and proprio below the knee RLE  Eyes without evidence of nystagmus  Tone is normal without evidence of spasticity      Assessment/Plan: 1. Functional deficits which require 3+ hours per day of interdisciplinary therapy in a comprehensive inpatient rehab setting. Physiatrist is providing close team supervision and 24 hour management of active medical problems listed below. Physiatrist and rehab team continue to assess barriers to  discharge/monitor patient progress toward functional and medical goals  Care Tool:  Bathing    Body parts bathed by patient: Right arm, Left arm, Chest, Front perineal area, Abdomen, Buttocks, Left upper leg, Right upper leg, Right lower leg, Face, Left lower leg         Bathing assist Assist Level: Contact Guard/Touching assist     Upper Body Dressing/Undressing Upper body dressing   What is the patient wearing?: Pull over shirt    Upper body assist Assist Level: Supervision/Verbal cueing    Lower Body Dressing/Undressing Lower body dressing      What is the patient wearing?: Underwear/pull up, Pants     Lower body assist Assist for lower body dressing: Minimal Assistance - Patient > 75%     Toileting Toileting    Toileting assist Assist for toileting: Minimal Assistance - Patient > 75%     Transfers Chair/bed transfer  Transfers assist     Chair/bed transfer assist level: Minimal Assistance - Patient > 75%     Locomotion Ambulation   Ambulation assist      Assist level: Moderate Assistance - Patient 50 - 74% Assistive device: Hand  held assist Max distance: 30   Walk 10 feet activity   Assist     Assist level: Moderate Assistance - Patient - 50 - 74% Assistive device: Hand held assist   Walk 50 feet activity   Assist    Assist level: Moderate Assistance - Patient - 50 - 74% Assistive device: Hand held assist    Walk 150 feet activity   Assist Walk 150 feet activity did not occur: Safety/medical concerns         Walk 10 feet on uneven surface  activity   Assist     Assist level: Moderate Assistance - Patient - 50 - 74%     Wheelchair     Assist Is the patient using a wheelchair?: Yes Type of Wheelchair: Manual    Wheelchair assist level: Supervision/Verbal cueing Max wheelchair distance: 150    Wheelchair 50 feet with 2 turns activity    Assist        Assist Level: Supervision/Verbal cueing    Wheelchair 150 feet activity     Assist      Assist Level: Supervision/Verbal cueing   Blood pressure 109/65, pulse 73, temperature 97.8 F (36.6 C), temperature source Oral, resp. rate 17, height '5\' 5"'$  (1.651 m), weight 82.6 kg, last menstrual period 09/22/2022, SpO2 100 %.  Medical Problem List and Plan: 1. 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 Formulized individual treatment plan based on medical history, underlying problem and comorbidities.         2.  Antithrombotics: -DVT/anticoagulation: Doppler negative  Pharmaceutical: Lovenox '40mg'$  QD -antiplatelet therapy: DAPT (ASA '81mg'$  QD/Plavix '75mg'$  QD) for 3 weeks followed by aspirin alone (end date: 10/24/22) 3. Pain Management: Fioricet 1 tab prn for HA  -10/11/22 Kpad ordered for thigh soreness 4. Mood/Behavior/Sleep: LCSW to follow for evaluation and support.              --trazodone prn for chronic insomnia (melatonin not very effective)             -antipsychotic agents: N/A  -wellbutrin '300mg'$  QD 5. Neuropsych/cognition: This patient is capable of making decisions on her own behalf. 6. Skin/Wound Care:  Routine pressure relief measures 7. Fluids/Electrolytes/Nutrition:  Monitor I/O. Stable labs 10/09/22. Monitor weekly labs starting 10/13/22 8. Hypotension: SBP soft but increasing   Monitor for orthostatic symptoms  -10/15/22 BP stable, monitor Vitals:   10/11/22 1357 10/11/22 1953 10/12/22 0615 10/12/22 1452  BP: 111/72 110/60 (!) 103/58 111/69   10/12/22 1948 10/13/22 0520 10/13/22 1347 10/13/22 1932  BP: 111/69 105/63 114/73 108/63   10/14/22 0511 10/14/22 1453  10/14/22 1933 10/15/22 0529  BP: 109/66 118/70 123/68 109/65     9. Migraines: Has been managed with use of Topamax '75mg'$  QHS and Imitrex twice a month on average.  10.Transient hypokalemia: Resolved w/ supplement.  Monitor on weekly labs, next 10/13/22    Latest Ref Rng & Units 10/13/2022    7:13 AM 10/09/2022    6:44 AM 10/08/2022    8:17 AM  BMP  Glucose 70 - 99 mg/dL 136  98  123   BUN 6 - 20 mg/dL '9  7  7   '$ Creatinine 0.44 - 1.00 mg/dL 0.63  0.64  0.66   Sodium 135 - 145 mmol/L 136  138  137   Potassium 3.5 - 5.1 mmol/L 3.6  3.9  3.8   Chloride 98 - 111 mmol/L 109  109  107   CO2 22 - 32 mmol/L '22  21  22   '$ Calcium 8.9 - 10.3 mg/dL 9.0  9.1  9.1     11. Low vitamin D level: Ergocalciferol-21.7 on 08/25/22             --resume supplement 50000IU/week 12. Neutropenia: mild stable - cont to monitor weekly, stable on  10/13/22    Latest Ref Rng & Units 10/13/2022    7:13 AM 10/09/2022    6:44 AM 10/08/2022    8:17 AM  CBC  WBC 4.0 - 10.5 K/uL 3.6  3.2  3.8   Hemoglobin 12.0 - 15.0 g/dL 11.4  10.9  11.4   Hematocrit 36.0 - 46.0 % 36.0  33.7  36.6   Platelets 150 - 400 K/uL 197  153  171     13. Intermittent constipation: No issues so far. Used colace 2x /wk prn at home.   14.  Hyperlipidemia.  Continue Lipitor '20mg'$  QD 15. Hypothyroidism: cont Armour thyroid '180mg'$  QD 16. R calf pain: doppler negative, improved after stretch, pt with delayed onset muscle soreness resolving     LOS: 7 days A FACE TO FACE EVALUATION WAS PERFORMED  Charlett Blake 10/15/2022, 8:32 AM

## 2022-10-15 NOTE — Patient Care Conference (Signed)
Inpatient RehabilitationTeam Conference and Plan of Care Update Date: 10/15/2022   Time: 10:18 AM    Patient Name: Donna Mccarthy      Medical Record Number: [id number]  Date of Birth: 1976/04/25 Sex: Female         Room/Bed: 4W07C/4W07C-01 Payor Info: Payor: [place] EMPLOYEE / Plan: [place] [employer] FOCUS / Product Type: *No Product type* /    Admit Date/Time:  10/08/2022  6:16 PM  Primary Diagnosis:  Stroke (cerebrum) [facility])  Hospital Problems: Principal Problem:   Stroke (cerebrum) ([place]) Active Problems:   Panic disorder with agoraphobia and moderate panic attacks    Expected Discharge Date: Expected Discharge Date: 10/23/22  Team Members Present: Physician leading conference: Dr. Alysia Penna Social Worker Present: Erlene Quan, BSW Nurse Present: Dorien Chihuahua, RN PT Present: Alden Hipp, PT OT Present: Meriel Pica, OT SLP Present: Sherren Kerns, SLP PPS Coordinator present : Gunnar Fusi, SLP     Current Status/Progress Goal Weekly Team Focus  Bowel/Bladder   Pt is continent of bowel/bladder   Pt will remain continent of bowel/bladder   Will assess qshift and PRN    Swallow/Nutrition/ Hydration       N/A       ADL's   close Supervision to occasional light CGA, diplopia has resolved.   Mod I with self care, S with home management   ADL training, balance/posture, R side coordination, functional mobility, pt education    Mobility   Independent w/ bed mobility. CGA for transfers. CGA w/ gait ~275f w/ AD, and same distance without AD with increased hesitancy of movement, continued ataxia but improving trunk control   Mod I for transfers and gait  Gait, dynamic balance, weight shifts, LE strength, stairs, motor control, safety, pt and caregiver education    Communication   mod I   mod I - goal met and no longer addressing   high level word finding strategies    Safety/Cognition/ Behavioral Observations  mod I-to-sup for basic-mildly  complex cognition, min A for complex tasks   mod I-to-sup A complex cognition   problem solving, recall, scheduling/organization tasks, medication/money management, education    Pain   Pt denies pain   Pt will continue to deny pain   Will assess qshift and PRN    Skin   Pt's skin is intact   Pt's skin will remain intact  Will assess qshift and PRN      Discharge Planning:  Discharging home with intermittent assistance from cousin and friend. 1 step to enter.   Team Discussion: Patient with atypical neurological presentation and questionable cause for symptoms; soreness more than weakness. Voltaren gel added for knee pain. Double vision has improved; continue to note fine motor coordination deficits with truncal ataxia and decreased processing and awareness deficits.  Patient on target to meet rehab goals: yes, currently needs close supervison for ADLs. Able to ambulate without an assistive device and working on dynamic balance.  Needs mod I for mild comprehension deficits. Goals for discharge set for MOD I overall.  *See Care Plan and progress notes for long and short-term goals.   Revisions to Treatment Plan:  Neuro psych referral; anxiety   Teaching Needs: Safety, medications, dietary modification, transfers, etc.   Current Barriers to Discharge: Decreased caregiver support and Home enviroment access/layout  Possible Resolutions to Barriers: Family education OP follow up services DME: TTB     Medical Summary Current Status: RLE weakness adn pain, reduced higher level balance, diplopia resolved  Barriers to Discharge: Other (comments)  Barriers to Discharge Comments: anxiety Possible Resolutions to Barriers/Weekly Focus: Neuropsych to follow   Continued Need for Acute Rehabilitation Level of Care: The patient requires daily medical management by a physician with specialized training in physical medicine and rehabilitation for the following reasons: Direction of a  multidisciplinary physical rehabilitation program to maximize functional independence : Yes Medical management of patient stability for increased activity during participation in an intensive rehabilitation regime.: Yes Analysis of laboratory values and/or radiology reports with any subsequent need for medication adjustment and/or medical intervention. : Yes   I attest that I was present, lead the team conference, and concur with the assessment and plan of the team.   Dorien Chihuahua B 10/15/2022, 2:55 PM

## 2022-10-15 NOTE — Progress Notes (Addendum)
Speech Language Pathology Daily Session Note  Patient Details  Name: Donna Mccarthy MRN: [id number] Date of Birth: [birth year]  Today's Date: 10/15/2022 SLP Individual Time: 1305-1400 SLP Individual Time Calculation (min): 55 min  Short Term Goals: Week 1: SLP Short Term Goal 1 (Week 1): Patient will utilize memory compensations to recall novel information with sup A verbal cues SLP Short Term Goal 2 (Week 1): Patient will complete problem solving pertaining to iADLs (e.g., medication/money management, with min A verbal cues SLP Short Term Goal 3 (Week 1): Patient will complete complex scheduling and organizing tasks with min A verbal cues SLP Short Term Goal 4 (Week 1): Patient will verbalize recall and utilize word finding strategies during structured and/or unstructured cognitive-linguistic/language tasks with sup A verbal cues  Skilled Therapeutic Interventions: Pt seen this date for skilled ST intervention targeting cognitive goals outlined above. Pt received awake/alert and OOB in recliner chair. Agreeable to intervention in hospital room. Pleasant and participatory throughout.  Today's session with emphasis on cognitive skill training within functional context related to iADL's, in addition to providing pt education re: stroke and stroke prevention. With set-up assistance, pt completed BID pill organizer for all 8 of her current medications with 100% accuracy and no errors, and was noted to utilize compensatory executive functioning strategies (talking aloud, double checking) at the Mod I level. Prior to completing BID pill organizer, pt recalled all her currently scheduled medications at the Mod I level, in addition to recent therapy activities and daily events. States she feels her memory is slowing improving, though does report intermittent forgetfulness, particularly with R/L discrimination and what she ate for meals. In an effort to aid in pt's recall, SLP provided education re: use of  writing as a tool to recall therapy tasks + recommendations and meals eaten, and encouraged pt to write aforementioned information on daily schedule to reinforce accurate recall; she verbalized understanding though will need reinforcement of this intervention. Anticipate pt will be able to complete a pill organizer independently upon d/c - pt in agreement.  Lastly, SLP reviewed handouts in pt's education binder to reinforce s/sx concerning for stroke, stroke prevention, and living a healthy brain lifestyle. During education, pt demonstrated excellent awareness of her current medical status, location of CVA, and possible causes of CVA. Pt actively participated in education and provided understanding via teach back. During discussion, pt endorsed a recent increase in stress and poor sleep; therefore, reviewed available resources to include stress management group on IPR and PRN medication available for sleep, which pt states she was given last night and that it was helpful. Further endorses that she took melatonin, prior to admission, due to nighttime restlessness. Reinforced asking her MD about sleep if this continues to be problematic  -she verbalized understanding, and reports that she would like to attend a stress management group - OTA and RT notified. Additionally, provided education on [facility] application to stay up to date on all of her medical labs, therapy notes, appointments, etc. - pt reports she has the application downloaded and is using this to keep informed of her medical care. SLP provided reinforcement and accolades for agency and initiation.   In upcoming sessions, pt would like to address money management, scheduling, and ongoing education.  At the end of today's session, pt requesting to use the bathroom and ambulated with RW, per safety plan, without issue. Pt continent of bladder. Returned to chair with safety measures in place.  Pt left in room and OOB in recliner  chair with all safety  measures activated and call bell within reach. Continue per current ST POC.  Pain No pain reported; NAD  Therapy/Group: Individual Therapy  Lynlee Stratton A Sameul Tagle 10/15/2022, 3:41 PM

## 2022-10-15 NOTE — Progress Notes (Signed)
Occupational Therapy Session Note  Patient Details  Name: ZARRAH TESKA MRN: [id number] Date of Birth: [birth year]  Today's Date: 10/15/2022 OT Individual Time: 1115-1200 OT Individual Time Calculation (min): 45 min    Short Term Goals: Week 1:  OT Short Term Goal 1 (Week 1): Patient will ambulate with device and min A to transition into bathroom. OT Short Term Goal 2 (Week 1): Patient will maintain dynamic standing balance within functional BADL task with CGA OT Short Term Goal 3 (Week 1): Patient will don shoes with set-set-up A.  Skilled Therapeutic Interventions/Progress Updates:    Pt received in recliner ready for therapy.  Pt ambulated to ortho gym with close S to light CGA.  Therapy session focused on Pilates based exercises to increase core strength and motor control.  Pt sat on mat and worked in long sitting with modified pilates roll ups,  supine with knee to chest and trunk flexion,  sidelying for clam shells and leg abduction (able to with L leg, but R hip unable to due to muscle cramping), sidelying to side plank on elbow.  Sitting edge of mat, pt worked core strength with overhead arm circles,  "kayaking" with holding 1# ball.  Pt participated extremely well.  Pt ambulated back to room with supervision.  Pt resting in recliner with all needs met.    Therapy Documentation Precautions:  Precautions Precautions: Fall Precaution Comments: double vision Restrictions Weight Bearing Restrictions: No    Vital Signs: Therapy Vitals Temp: 97.8 F (36.6 C) Temp Source: Oral Pulse Rate: 73 Resp: 17 BP: 109/65 Patient Position (if appropriate): Lying Oxygen Therapy SpO2: 100 % O2 Device: Room Air Pain:  She continues to have some pain in R thigh, using kpad for comfort.     Therapy/Group: Individual Therapy  Rewa Weissberg 10/15/2022, 8:56 AM

## 2022-10-15 NOTE — Progress Notes (Signed)
Patient ID: Donna Mccarthy, female   DOB: [birth year], 47 y.o.   MRN: [id number]  Team Conference Report to Patient/Family  Team Conference discussion was reviewed with the patient and caregiver, including goals, any changes in plan of care and target discharge date.  Patient and caregiver express understanding and are in agreement.  The patient has a target discharge date of 10/23/22.  SW met with patient and provided team conference updates. Sw received paperwork from pt's job and will have PA work on. No additional questions or concerns.   Dyanne Iha 10/15/2022, 1:40 PM

## 2022-10-16 NOTE — Progress Notes (Signed)
Physical Therapy Session Note  Patient Details  Name: Donna Mccarthy MRN: [id number] Date of Birth: 1976/06/01  Today's Date: 10/16/2022 PT Individual Time: [id number] PT Individual Time Calculation (min): 30 min   Short Term Goals: Week 1:  PT Short Term Goal 1 (Week 1): Pt will perform transfers with supervision assist and no AD PT Short Term Goal 2 (Week 1): Pt will ambulate >133f with supervision assist and LRAD PT Short Term Goal 3 (Week 1): Pt eill ascend 12 steps with CGA and 1 rails PT Short Term Goal 4 (Week 1): Pt will perform Berg balance scale  Skilled Therapeutic Interventions/Progress Updates:    Patient received in room recliner with no c/o discomfort or pain. Pt agreeable to therapy session. Sit<>stand from recliner performed at supervision level and pt ambulated >150' around hallway, dayroom, and to main gym with supervision and no AD. Exercises focused and dynamic balance and weight shift Rt/Lt performed today (see below).  Rockerboard (in // bars for safety and visual feedback of mirror, no UE support) - lateral taps - 2 minutes - mini-squats while maintaining midline, 15 reps Airex: 4lbs med ball toss to rebounder  - normal stance BOS, 10 reps - modified tandem stance Rt foot back, 10 reps - Bil LE SLS with contralateral toe back, 10 reps  SLS with shoulder press 4lb med ball 10x bil LE (greater difficulty with Lt SLS and pt had 2 episode of LOB requiring stepping strategy to stabilize) Lateral lunge/slide- puck under foot, 10x bil LE  EOS pt received in main gym by LLattie HawRT to for next therapy session.    Therapy Documentation Precautions:  Precautions Precautions: Fall Precaution Comments: double vision Restrictions Weight Bearing Restrictions: No   Therapy/Group: Individual Therapy   RVerner Mould DPT Acute Rehabilitation Services Office [phone] 10/16/22 1:05 PM

## 2022-10-16 NOTE — Progress Notes (Addendum)
Occupational Therapy Weekly Progress Note  Patient Details  Name: KEYAWNA LENNEN MRN: [id number] Date of Birth: 07/14/1976  Beginning of progress report period: October 09, 2022 End of progress report period: October 16, 2022  Patient has met 3 of 3 short term goals.  Pt has been making excellent progress and is now set up to mod I with BADLs and supervision with ambulation around room and into bathroom.  Focusing this week on dynamic balance and strength to enable her to return home with only intermittent supervision and progress to returning to work.   Patient continues to demonstrate the following deficits: muscle joint tightness, decreased coordination, and decreased standing balance (tightness and incoordination in RLE only) and therefore will continue to benefit from skilled OT intervention to enhance overall performance with BADL, iADL, and Vocation.  Patient progressing toward long term goals..  Continue plan of care.  OT Short Term Goals Week 1:  OT Short Term Goal 1 (Week 1): Patient will ambulate with device and min A to transition into bathroom. OT Short Term Goal 1 - Progress (Week 1): Met OT Short Term Goal 2 (Week 1): Patient will maintain dynamic standing balance within functional BADL task with CGA OT Short Term Goal 2 - Progress (Week 1): Met OT Short Term Goal 3 (Week 1): Patient will don shoes with set-set-up A. OT Short Term Goal 3 - Progress (Week 1): Met Week 2:  OT Short Term Goal 1 (Week 2): STGs = LTGs   Therapy Documentation Precautions:  Precautions Precautions: Fall Precaution Comments: double vision Restrictions Weight Bearing Restrictions: No  ADL: ADL Eating: Independent Grooming: Independent Where Assessed-Grooming: Standing at sink Upper Body Bathing: Independent Where Assessed-Upper Body Bathing: Shower Lower Body Bathing: Setup Where Assessed-Lower Body Bathing: Shower Upper Body Dressing: Independent Where Assessed-Upper Body  Dressing: Edge of bed Lower Body Dressing: Independent Where Assessed-Lower Body Dressing: Edge of bed Toileting: Independent Where Assessed-Toileting: Glass blower/designer: Distant supervision Armed forces technical officer Method: Magazine features editor: Distant supervision Social research officer, government Method: Heritage manager: Other (comment) (n/a) ADL Comments: pt stands to shower  [place] 10/16/2022, 12:55 PM

## 2022-10-16 NOTE — Progress Notes (Signed)
Occupational Therapy Session Note  Patient Details  Name: Donna Mccarthy MRN: [id number] Date of Birth: April 02, 1976  Today's Date: 10/16/2022 OT Individual Time: [id number] OT Individual Time Calculation (min): 60 min    Short Term Goals: Week 1:  OT Short Term Goal 1 (Week 1): Patient will ambulate with device and min A to transition into bathroom. OT Short Term Goal 2 (Week 1): Patient will maintain dynamic standing balance within functional BADL task with CGA OT Short Term Goal 3 (Week 1): Patient will don shoes with set-set-up A.  Skilled Therapeutic Interventions/Progress Updates:    Pt received in recliner requesting to shower.  Pt ambulated around room with S as she gathered clothing from closet, drawers, gathered her supplies.  Once in the bathroom, she toileted independently and then showered in standing without A.  Dressed self without A and no LOB.  Due to her good safety awareness and balance, pt no longer needs seat alarm.  Team in agreement.  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 Pt felt she could type, use computer mouse, and see computer screen with no difficulty.    She ambulated to gym to engage in dynamic balance activities. Pt carried a laundry basket on L hip using L hand as she walked around gym to pick up bean bags in various locations on floor level. No difficulty except for c/o tightness in R leg.  Core strengthening with standing and picking up 9 lb dumbbell placed on stool practicing safe body mechanics for lifting heavy weight  and pulling arm up and then to a bicep curl.  She alternated arms for 3 min to challenge her endurance.  HR increased to 130 after this aerobic challenge and then decreased to 95 after 1-2 min of seated rest.  Standing ex with moving heavy weight around torso from front to back (she states this is and ex she does at the gym) and lateral torso  leans with weight.    Attempted standing on 1 leg (each side) with no UE support but very challenging for her.  With bar for support pt able to lift L leg and only stand on R leg and then she could release hand support for 10 -20 sec at the most before she had to put L foot down.  Tried lifting R leg to stand on L but due to tightness in thigh this was uncomfortable for her.   Excellent effort today. Pt returned to room with all needs met.   Therapy Documentation Precautions:  Precautions Precautions: Fall Precaution Comments: double vision Restrictions Weight Bearing Restrictions: No Pain: Pain Assessment Pain Scale: 0-10 Pain Score: 3  Pain Type: Acute pain Pain Location: Head Pain Descriptors / Indicators: Headache ADL: ADL Eating: Independent Grooming: Independent Where Assessed-Grooming: Standing at sink Upper Body Bathing: Independent Where Assessed-Upper Body Bathing: Shower Lower Body Bathing: Setup Where Assessed-Lower Body Bathing: Shower Upper Body Dressing: Independent Where Assessed-Upper Body Dressing: Edge of bed Lower Body Dressing: Independent Where Assessed-Lower Body Dressing: Edge of bed Toileting: Independent Where Assessed-Toileting: Glass blower/designer: Distant supervision Armed forces technical officer Method: Magazine features editor: Distant supervision Social research officer, government Method: Heritage manager: Other (comment) (n/a) ADL Comments: pt stands to shower   Therapy/Group: Individual Therapy  [place] 10/16/2022, 12:33 PM

## 2022-10-16 NOTE — Group Note (Signed)
Patient Details Name: Donna Mccarthy MRN: [id number] DOB: [birth year] Today's Date: 10/16/2022  Time Calculation: OT Group Time Calculation OT Group Start Time: [id number] OT Group Stop Time: [id number] OT Group Time Calculation (min): 60 min       Group Description: Kitchen/ home making/ fall prevention: Pt participated in group session with a focus on kitchen tasks/mobility, energy conservation strategies ( ECS) and safe kitchen set- up for DC. Discussed safe recommendations for navigating kitchen. Discussed energy conservation strategies in relation to kitchen tasks such as rearranging frequently used kitchen items and eliminating potential fall risks in the kitchen. Pt able to ambulate in kitchen with no AD with supervision to retrieve items items OH with an emphasis on standing tolerance, dynamic standing balance, RUE FMC, attention to task, safety, problem solving,and IADLs.     Individual level documentation: Patient participated with full collaboration during session.   Pain: No pain   Precious Haws 10/16/2022, 4:17 PM

## 2022-10-16 NOTE — Progress Notes (Addendum)
Speech Language Pathology Weekly Progress and Session Note  Patient Details  Name: Donna Mccarthy MRN: [id number] Date of Birth: 1975-12-16  Beginning of progress report period: October 09, 2022 End of progress report period: October 16, 2022  Today's Date: 10/16/2022 SLP Individual Time: [id number] SLP Individual Time Calculation (min): 40 min  Short Term Goals: Week 1: SLP Short Term Goal 1 (Week 1): Patient will utilize memory compensations to recall novel information with sup A verbal cues SLP Short Term Goal 1 - Progress (Week 1): Met SLP Short Term Goal 2 (Week 1): Patient will complete problem solving pertaining to iADLs (e.g., medication/money management, with min A verbal cues SLP Short Term Goal 2 - Progress (Week 1): Met SLP Short Term Goal 3 (Week 1): Patient will complete complex scheduling and organizing tasks with min A verbal cues SLP Short Term Goal 3 - Progress (Week 1): Met SLP Short Term Goal 4 (Week 1): Patient will verbalize recall and utilize word finding strategies during structured and/or unstructured cognitive-linguistic/language tasks with sup A verbal cues SLP Short Term Goal 4 - Progress (Week 1): Met    New Short Term Goals: Week 2: SLP Short Term Goal 1 (Week 2): STG's = LTG's due to ELOS  Weekly Progress Updates: Pt has demonstrated excellent gains, this past reporting period, as evident by meeting all 4 short-term goals outlined above. Currently, pt is completed complex cognitive tasks at a Min to Sup A level, and is approaching Mod I. Pt feels her cognition is improving. Continues to endorse difficulty with word finding intermittently. Pt education ongoing. Anticipate pt will benefit from intermittent supervision and assistance with complex iADL tasks at d/c. Will continue to assess progress this upcoming week to see if pt will need OP ST f/u.   Intensity: Minumum of 1-2 x/day, 30 to 90 minutes Frequency: 3 to 5 out of 7 days Duration/Length of  Stay: ELOS 3/7 Treatment/Interventions: Cognitive remediation/compensation;Functional tasks;Patient/family education;Therapeutic Activities   Daily Session  Skilled Therapeutic Interventions:  Pt seen this date for skilled ST intervention targeting cognitive goals outlined in care plan. Pt received awake/alert and sitting upright in bed. Agreeable to intervention in speech office. Transferred from bed to w/c with RW, per safety plan, without issues. Pleasant and participatory throughout.  SLP facilitated today's session by providing a complex scheduling task for pt to complete, which pt completed with 100% accuracy when given overall Min A verbal cues for use of compensatory executive functioning strategies to include creating a list and checking items off that had been completed. Additionally, pt participated in discussion re: her current ST POC and cognitive-linguistic tasks that remain challenging. Pt reports she feels her memory is improving, though endorses some lingering word finding difficulties. Recalled information from last session, yesterday afternoon, and this AM accurately at the Mod I level.  Given aforementioned pt report, SLP administered the Short Form of the [place], which pt received a 10/15. Overall, she benefited from [place] A verbal cues to generate words that were more fringe/lower-frequency. Additionally, facilitated divergent naming - pt named 10 out of 10 items in a concrete category (e.g. fruits and vegetables) in under 1 minute with no additional cueing. When asked to participate in abstract naming (e.g. naming things that are soft), pt named 7 out of 10 items in targeted category given extra time; improved to 10 out of 10 items given Min A verbal cues. Reviewed compensatory word finding strategies with pt; she verbalized understanding and utilized the strategy of  using synonyms independently.   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  Pt returned to room and left OOB in recliner chair with all safety measures activated, call bell within reach, and all immediate needs met. Continue per updated ST POC.    Pain No pain reported; NAD  Therapy/Group: Individual Therapy  Donna Mccarthy 10/16/2022, 7:48 PM

## 2022-10-16 NOTE — Evaluation (Signed)
Recreational Therapy Assessment and Plan  Patient Details  Name: Donna Mccarthy MRN: [id number] Date of Birth: 23-Apr-1976 Today's Date: 10/16/2022  Rehab Potential:  Good ELOS:   d/c 3/7  Assessment  Hospital Problem: Principal Problem:   Stroke (cerebrum) [facility])     Past Medical History:      Past Medical History:  Diagnosis Date   Allergy     Anemia     Anxiety     Arthritis      arthritis knees   Blood transfusion without reported diagnosis     GERD (gastroesophageal reflux disease)     Hypothyroidism     Neuromuscular disorder ([place])     Obesity (BMI 30-39.9) 01/05/2019    Past Surgical History:       Past Surgical History:  Procedure Laterality Date   BIOPSY   04/03/2020    Procedure: BIOPSY;  Surgeon: Harvel Quale, MD;  [facility];  Service: Gastroenterology;;  antral, body, distal esophagus, mid esophagus   BRACHIOPLASTY Bilateral 07/02/2020    Procedure: BILATERAL BRACHIOPLASTY WITH LIPOSUCTION;  Surgeon: [name], MD;  Location: [place];  Service: Plastics;  Laterality: Bilateral;   DILATION AND CURETTAGE OF UTERUS   2022   ESOPHAGOGASTRODUODENOSCOPY (EGD) WITH PROPOFOL N/A 04/03/2020    Procedure: ESOPHAGOGASTRODUODENOSCOPY (EGD) WITH PROPOFOL;  Surgeon: Harvel Quale, MD;  [facility];  Service: Gastroenterology;  Laterality: N/A;  11:15   LEEP   08/23/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   LIPOSUCTION Bilateral 07/02/2020    Procedure: LIPOSUCTION;  Surgeon: [name], MD;  Location: [place];  Service: Plastics;  Laterality: Bilateral;   PANNICULECTOMY N/A 08/04/2019    Procedure: PANNICULECTOMY;  Surgeon: [name], MD;  Location: [place];  Service: Plastics;  Laterality: N/A;  2 hours only, please   TEE WITHOUT CARDIOVERSION N/A 10/07/2022    Procedure: TRANSESOPHAGEAL ECHOCARDIOGRAM (TEE);  Surgeon: Freada Bergeron, MD;  Location: [facility]  ENDOSCOPY;  Service: Cardiovascular;  Laterality: N/A;      Assessment & Plan Clinical Impression: Patient is a 47 year old RH-female with history of migraines, anxiety, Vitamin D deficiency, Covid 3 weeks PTA; who was admitted on 10/03/22 with left facial droop, blurred vision and slurred speech that started while she was driving. She was found to have severe dysarthria with left facial droop that was variable and severely and b/l CN VI incomplete palsy.   CTA without LVO and  and she received TNK.  MRI/MRA brain done and negative for stroke, stenosis or acute process. 2D echo showed EF 60-65% with no wall abnormalities. She had dysconjugate gaze with diplopia that Dr. Erlinda Hong felt was more consistent with possible brainstem infarct secondary to small vessel disease and not seen on MRI. She was noted to have right sided weakness with sensory deficits. Neurology recommended DAPT X 3 weeks followed by ASA alone.     Cardiology consulted and TEE done 02/20 revealing EF 60-65% with no wall abnormality but showed possible intrapulmonary shunt and small PFO --TCD recommended for further evaluation. TCD done today and negative for right to left shunt.  PT/OT has been working with patient who continues to be limited by double vision, right sided weakness with decreased in motor control and right sided  sensory deficits affecting ability to carry out ADLs and mobility. CIR recommended due to functional decline.    Patient reports some continued mild esophageal discomfort after TEE.  She reports mild headache earlier today however this has improved.  She feels like her double vision is gradually improving.  She continues to have weakness and sensory changes on her right side.  Patient transferred to CIR on 10/08/2022 .    Pt presents with decreased activity tolerance, decreased functional mobility, decreased balance, decreased coordination, decreased vision, decreased memory, decreased problem solving, feelings of stress  Limiting pt's independence with leisure/community pursuits.  Met with pt today to discuss TR services including leisure education, activity analysis/modifications and stress management.  Hand out provided.  Also discussed the importance of social, emotional, spiritual health in addition to physical health and their effects on overall health and wellness.  Pt stated understanding.  Plan Min 1 TR session >20 minutes during LOS  Recommendations for other services: Neuropsych  Discharge Criteria: Patient will be discharged from TR if patient refuses treatment 3 consecutive times without medical reason.  If treatment goals not met, if there is a change in medical status, if patient makes no progress towards goals or if patient is discharged from hospital.  The above assessment, treatment plan, treatment alternatives and goals were discussed and mutually agreed upon: by patient  [place] 10/16/2022, 12:47 PM

## 2022-10-16 NOTE — Group Note (Signed)
Patient Details Name: TINEISHA PERTUIT MRN: [id number] DOB: 1976-07-06 Today's Date: 10/16/2022  Group Description: Pt participated in group session with a focus on kitchen tasks/mobility, energy conservation strategies (ECS) and safe kitchen set up for DC.  Discussed safe recommendations for navigating kitchen.  Discussed energy conservation strategies in relation to kitchen tasks such as using RW bag, rearranging frequently used kitchen items and eliminating potential fall risks in the kitchen.  Pt able to ambulate in kitchen with supervision without AD to retrieve items at multiple heights, maintain dynamic standing balance during tasks requiring bilateral hand use.  Pt safely able to walk transporting items as well.  Pt enjoyed group session with another pt sharing experiences and laughter.   Individual level documentation: Patient participated with full collaboration during session.   Pain:no c/o      Zoejane Gaulin 10/16/2022, 4:27 PM

## 2022-10-16 NOTE — Progress Notes (Signed)
PROGRESS NOTE   Subjective/Complaints:  Discussed d/c date and rec for outpt PT/OT  Discussed return to work and driving (both are pt goals )   Double vision resolved   ROS:  Denies fevers, chills, CP, SOB, abd pain, N/V/D/C,  Objective:   No results found. No results for input(s): "WBC", "HGB", "HCT", "PLT" in the last 72 hours.   No results for input(s): "NA", "K", "CL", "CO2", "GLUCOSE", "BUN", "CREATININE", "CALCIUM" in the last 72 hours.    Intake/Output Summary (Last 24 hours) at 10/16/2022 0835 Last data filed at 10/15/2022 1900 Gross per 24 hour  Intake 356 ml  Output --  Net 356 ml         Physical Exam: Vital Signs Blood pressure 107/65, pulse 76, temperature 97.9 F (36.6 C), resp. rate 18, height '5\' 5"'$  (1.651 m), weight 82.6 kg, last menstrual period 09/22/2022, SpO2 100 %.   General: No acute distress, sitting in chair with PT Mood and affect are appropriate Heart: Regular rate and rhythm no rubs murmurs or extra sounds Lungs: Clear to auscultation, breathing unlabored, no rales or wheezes Abdomen: Positive bowel sounds, soft nontender to palpation, nondistended Extremities: No clubbing, cyanosis, or edema. No tenderness in RIght quad or R calf today   5/5 in BUE and LLE 4- Right HF, KE, ADF (?full effort)  Sensation now able to identify LT below the knee   Eyes without evidence of nystagmus  Tone is normal without evidence of spasticity      Assessment/Plan: 1. Functional deficits which require 3+ hours per day of interdisciplinary therapy in a comprehensive inpatient rehab setting. Physiatrist is providing close team supervision and 24 hour management of active medical problems listed below. Physiatrist and rehab team continue to assess barriers to discharge/monitor patient progress toward functional and medical goals  Care Tool:  Bathing    Body parts bathed by patient: Right arm,  Left arm, Chest, Front perineal area, Abdomen, Buttocks, Left upper leg, Right upper leg, Right lower leg, Face, Left lower leg         Bathing assist Assist Level: Contact Guard/Touching assist     Upper Body Dressing/Undressing Upper body dressing   What is the patient wearing?: Pull over shirt    Upper body assist Assist Level: Supervision/Verbal cueing    Lower Body Dressing/Undressing Lower body dressing      What is the patient wearing?: Underwear/pull up, Pants     Lower body assist Assist for lower body dressing: Minimal Assistance - Patient > 75%     Toileting Toileting    Toileting assist Assist for toileting: Supervision/Verbal cueing     Transfers Chair/bed transfer  Transfers assist     Chair/bed transfer assist level: Supervision/Verbal cueing     Locomotion Ambulation   Ambulation assist      Assist level: Moderate Assistance - Patient 50 - 74% Assistive device: Hand held assist Max distance: 30   Walk 10 feet activity   Assist     Assist level: Moderate Assistance - Patient - 50 - 74% Assistive device: Hand held assist   Walk 50 feet activity   Assist    Assist level: Moderate Assistance -  Patient - 50 - 74% Assistive device: Hand held assist    Walk 150 feet activity   Assist Walk 150 feet activity did not occur: Safety/medical concerns         Walk 10 feet on uneven surface  activity   Assist     Assist level: Moderate Assistance - Patient - 50 - 74%     Wheelchair     Assist Is the patient using a wheelchair?: Yes Type of Wheelchair: Manual    Wheelchair assist level: Supervision/Verbal cueing Max wheelchair distance: 150    Wheelchair 50 feet with 2 turns activity    Assist        Assist Level: Supervision/Verbal cueing   Wheelchair 150 feet activity     Assist      Assist Level: Supervision/Verbal cueing   Blood pressure 107/65, pulse 76, temperature 97.9 F (36.6 C), resp.  rate 18, height '5\' 5"'$  (1.651 m), weight 82.6 kg, last menstrual period 09/22/2022, SpO2 100 %.  Medical Problem List and Plan: 1. 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  Antithrombotics: -DVT/anticoagulation: Doppler negative  Pharmaceutical: Lovenox '40mg'$  QD -antiplatelet therapy: DAPT (ASA '81mg'$  QD/Plavix '75mg'$  QD) for 3 weeks followed by aspirin alone (end date: 10/24/22) 3. Pain Management: Fioricet 1 tab prn for HA  -10/11/22 Kpad ordered for thigh soreness 4. Mood/Behavior/Sleep: LCSW to follow for evaluation and support.              --trazodone prn for chronic insomnia (melatonin not very effective)             -antipsychotic agents: N/A  -wellbutrin '300mg'$  QD 5. Neuropsych/cognition: This patient is capable of making decisions on her own behalf. 6. Skin/Wound Care:  Routine pressure relief measures 7. Fluids/Electrolytes/Nutrition:  Monitor I/O. Stable labs 10/09/22. Monitor weekly labs starting 10/13/22 8. Hypotension: SBP soft but increasing   Monitor for orthostatic symptoms  -10/15/22 BP stable, monitor Vitals:   10/12/22 0615 10/12/22 1452 10/12/22 1948 10/13/22 0520  BP: (!) 103/58 111/69 111/69 105/63   10/13/22 1347 10/13/22 1932 10/14/22 0511 10/14/22 1453  BP: 114/73 108/63 109/66 118/70   10/14/22 1933 10/15/22 0529 10/15/22 1429 10/15/22 2033  BP: 123/68 109/65 122/65 107/65     9. Migraines: Has been managed with use of Topamax '75mg'$  QHS and Imitrex twice a month on average.  10.Transient hypokalemia: Resolved w/ supplement.  Monitor on weekly labs, next 10/13/22    Latest Ref Rng & Units 10/13/2022    7:13 AM 10/09/2022    6:44 AM 10/08/2022    8:17 AM  BMP  Glucose 70 - 99 mg/dL 136  98  123   BUN 6 - 20 mg/dL '9  7  7   '$ Creatinine 0.44 -  1.00 mg/dL 0.63  0.64  0.66   Sodium 135 - 145 mmol/L 136  138  137   Potassium 3.5 - 5.1 mmol/L 3.6  3.9  3.8   Chloride 98 - 111 mmol/L 109  109  107   CO2 22 - 32 mmol/L '22  21  22   '$ Calcium  8.9 - 10.3 mg/dL 9.0  9.1  9.1     11. Low vitamin D level: Ergocalciferol-21.7 on 08/25/22             --resume supplement 50000IU/week 12. Neutropenia: mild stable - cont to monitor weekly, stable on  10/13/22    Latest Ref Rng & Units 10/13/2022    7:13 AM 10/09/2022    6:44 AM 10/08/2022    8:17 AM  CBC  WBC 4.0 - 10.5 K/uL 3.6  3.2  3.8   Hemoglobin 12.0 - 15.0 g/dL 11.4  10.9  11.4   Hematocrit 36.0 - 46.0 % 36.0  33.7  36.6   Platelets 150 - 400 K/uL 197  153  171     13. Intermittent constipation: No issues so far. Used colace 2x /wk prn at home.   14.  Hyperlipidemia.  Continue Lipitor '20mg'$  QD 15. Hypothyroidism: cont Armour thyroid '180mg'$  QD 16. R calf pain: doppler negative, improved after stretch, pt with delayed onset muscle soreness resolving     LOS: 8 days A FACE TO FACE EVALUATION WAS PERFORMED  Charlett Blake 10/16/2022, 8:35 AM

## 2022-10-17 NOTE — Progress Notes (Signed)
Recreational Therapy Session Note  Patient Details  Name: Donna Mccarthy MRN: [id number] Date of Birth: Mar 05, 1976 Today's Date: 10/17/2022  Pain: no c/o, pt icing R knee per therapist instruction Skilled Therapeutic Interventions/Progress Updates: Met with pt to discuss the purpose of community reintegration and potential goals.  Pt agreeable to participate in an outing on Tuesday 3/5.[place] 10/17/2022, 11:28 AM

## 2022-10-17 NOTE — Progress Notes (Signed)
Patient ID: Donna Mccarthy, female   DOB: May 25, 1976, 47 y.o.   MRN: [id number]  TTB ordered through [employer].

## 2022-10-17 NOTE — Progress Notes (Signed)
Speech Language Pathology Daily Session Note  Patient Details  Name: Donna Mccarthy MRN: [id number] Date of Birth: 1976/03/05  Today's Date: 10/17/2022 SLP Individual Time: 1450-1535 SLP Individual Time Calculation (min): 45 min  Short Term Goals: Week 2: SLP Short Term Goal 1 (Week 2): STG's = LTG's due to ELOS  Skilled Therapeutic Interventions: Pt was greeted in recliner chair and agreeable to SLP intervention with focus on cognitive goals.   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 Pt took SLP's vitals from a standing position using both manual and automatic blood pressure and pulse readings with mod I. Pt occasionally steadied her balance with light touch on bed rail. Pt navigated [employer], performed readings in a timely and efficient manner, and interpreted results appropriately and accurately.   SLP facilitated a complex scheduling task with min A fading to sup A verbal cues. Pt prioritized and organized information in order to generate a daily schedule given a list of places to visit within [place]. Pt enjoyed complexity of the task and stated it was helpful for her "critical thinking skills" and getting in the mindset of maintaining a daily schedule w/ various responsibilities as she considers eventually returning to work.   Pt was left in recliner with alarm activated and immediate needs within reach. Visitor present. Continue per POC.    Pain  None/denied  Therapy/Group: Individual Therapy  Patty Sermons 10/17/2022, 3:19 PM

## 2022-10-17 NOTE — Progress Notes (Signed)
Occupational Therapy Session Note  Patient Details  Name: Donna Mccarthy MRN: [id number] Date of Birth: [birth year]  Today's Date: 10/17/2022 OT Individual Time: [id number] OT Individual Time Calculation (min): 60 min    Short Term Goals: Week 2:  OT Short Term Goal 1 (Week 2): STGs = LTGs  Skilled Therapeutic Interventions/Progress Updates:    Pt received in recliner and did request to shower. Pt discussed thigh still feeling tight. Had pt work on standing hip flexor stretch. Pt felt this did help loosen up her muscle.   Pt ambulated around her room without AD with distant S as she gathered her clothing and toiletries. Pt showered, dressed, toileted independently.  Pt discussed cooking activity yesterday and stated it went well but she felt like her endurance was taxed.   Pt agreeable to trying endurance/cardio activities.  Pt ambulated to gym and started out with using the SCIFIT arm bike for tabata training at resistance 8.  She did 5 cycles of pushing pedals as fast as possible for 20 sec, then 20 second rest.  She then pushed pedals at RPM of 37 at resistance 8 for 3 min.  HR 150 after and reduced to 144 with standing rest break of 1 min but did not decrease until she took a seated rest break, then HR reduced to 110 within 1 min.    2nd trial for 2 minutes. HR 145 then only reduced to 140 in standing break,  seated break it reduced to 105 within 1 min.   For 3rd endurance challenge, pt held large physioball and did modified kettlebell swings for 15 reps.  This also increased HR to 145 but reduced to 105 after seated rest break.  Pt then ambulated over 1000 ft back to her room.  HR after her walk 130.  Pt rested in recliner with all needs met.  HR back to 95 after rest of 1 min and then 75. Marland Kitchen    Therapy Documentation Precautions:  Precautions Precautions: Fall Precaution Comments: double vision Restrictions Weight Bearing Restrictions: No   Pain: mild R thigh pain     ADL: ADL Eating: Independent Grooming: Independent Where Assessed-Grooming: Standing at sink Upper Body Bathing: Independent Where Assessed-Upper Body Bathing: Shower Lower Body Bathing: Setup Where Assessed-Lower Body Bathing: Shower Upper Body Dressing: Independent Where Assessed-Upper Body Dressing: Edge of bed Lower Body Dressing: Independent Where Assessed-Lower Body Dressing: Edge of bed Toileting: Independent Where Assessed-Toileting: Glass blower/designer: Distant supervision Armed forces technical officer Method: Magazine features editor: Distant supervision Social research officer, government Method: Heritage manager: Other (comment) (n/a) ADL Comments: pt stands to shower    Therapy/Group: Individual Therapy  [place] 10/17/2022, 8:55 AM

## 2022-10-17 NOTE — Progress Notes (Signed)
PROGRESS NOTE   Subjective/Complaints:  No issues overnite  Still using Kpad over quad and calf on right side before bed   ROS:  Denies fevers, chills, CP, SOB, abd pain, N/V/D/C,  Objective:   No results found. No results for input(s): "WBC", "HGB", "HCT", "PLT" in the last 72 hours.   No results for input(s): "NA", "K", "CL", "CO2", "GLUCOSE", "BUN", "CREATININE", "CALCIUM" in the last 72 hours.    Intake/Output Summary (Last 24 hours) at 10/17/2022 0729 Last data filed at 10/16/2022 1805 Gross per 24 hour  Intake 354 ml  Output --  Net 354 ml         Physical Exam: Vital Signs Blood pressure (!) 90/56, pulse 78, temperature 98 F (36.7 C), resp. rate 18, height '5\' 5"'$  (1.651 m), weight 82.6 kg, last menstrual period 09/22/2022, SpO2 99 %.   General: No acute distress, sitting in chair with PT Mood and affect are appropriate Heart: Regular rate and rhythm no rubs murmurs or extra sounds Lungs: Clear to auscultation, breathing unlabored, no rales or wheezes Abdomen: Positive bowel sounds, soft nontender to palpation, nondistended Extremities: No clubbing, cyanosis, or edema. No tenderness in RIght quad or R calf today  + tenderness over the RIght patellar tendon   5/5 in BUE and LLE 4- Right HF, KE, ADF (?full effort)  Sensation now able to identify LT below the knee   Eyes without evidence of nystagmus  Tone is normal without evidence of spasticity      Assessment/Plan: 1. Functional deficits which require 3+ hours per day of interdisciplinary therapy in a comprehensive inpatient rehab setting. Physiatrist is providing close team supervision and 24 hour management of active medical problems listed below. Physiatrist and rehab team continue to assess barriers to discharge/monitor patient progress toward functional and medical goals  Care Tool:  Bathing    Body parts bathed by patient: Right arm, Left  arm, Chest, Front perineal area, Abdomen, Buttocks, Left upper leg, Right upper leg, Right lower leg, Face, Left lower leg         Bathing assist Assist Level: Set up assist     Upper Body Dressing/Undressing Upper body dressing   What is the patient wearing?: Pull over shirt, Bra    Upper body assist Assist Level: Independent    Lower Body Dressing/Undressing Lower body dressing      What is the patient wearing?: Underwear/pull up, Pants     Lower body assist Assist for lower body dressing: Independent     Toileting Toileting    Toileting assist Assist for toileting: Independent     Transfers Chair/bed transfer  Transfers assist     Chair/bed transfer assist level: Supervision/Verbal cueing     Locomotion Ambulation   Ambulation assist      Assist level: Supervision/Verbal cueing Assistive device: No Device Max distance: 30   Walk 10 feet activity   Assist     Assist level: Supervision/Verbal cueing Assistive device: No Device   Walk 50 feet activity   Assist    Assist level: Supervision/Verbal cueing Assistive device: No Device    Walk 150 feet activity   Assist Walk 150 feet activity did  not occur: Safety/medical concerns  Assist level: Supervision/Verbal cueing Assistive device: No Device    Walk 10 feet on uneven surface  activity   Assist     Assist level: Moderate Assistance - Patient - 50 - 74%     Wheelchair     Assist Is the patient using a wheelchair?: Yes Type of Wheelchair: Manual    Wheelchair assist level: Supervision/Verbal cueing Max wheelchair distance: 150    Wheelchair 50 feet with 2 turns activity    Assist        Assist Level: Supervision/Verbal cueing   Wheelchair 150 feet activity     Assist      Assist Level: Supervision/Verbal cueing   Blood pressure (!) 90/56, pulse 78, temperature 98 F (36.7 C), resp. rate 18, height '5\' 5"'$  (1.651 m), weight 82.6 kg, last menstrual  period 09/22/2022, SpO2 99 %.  Medical Problem List and Plan: 1. 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  Antithrombotics: -DVT/anticoagulation: Doppler negative  Pharmaceutical: Lovenox '40mg'$  QD -antiplatelet therapy: DAPT (ASA '81mg'$  QD/Plavix '75mg'$  QD) for 3 weeks followed by aspirin alone (end date: 10/24/22) 3. Pain Management: Fioricet 1 tab prn for HA  -10/11/22 Kpad ordered for thigh soreness  Pain localizing to RIght patellar tendon- cont voltaren gel  4. Mood/Behavior/Sleep: LCSW to follow for evaluation and support.              --trazodone prn for chronic insomnia (melatonin not very effective)             -antipsychotic agents: N/A  -wellbutrin '300mg'$  QD 5. Neuropsych/cognition: This patient is capable of making decisions on her own behalf. 6. Skin/Wound Care:  Routine pressure relief measures 7. Fluids/Electrolytes/Nutrition:  Monitor I/O. Stable labs 10/09/22. Monitor weekly labs starting 10/13/22 8. Hypotension: SBP soft but increasing   Monitor for orthostatic symptoms  -10/15/22 BP stable, monitor Vitals:   10/13/22 0520 10/13/22 1347 10/13/22 1932 10/14/22 0511  BP: 105/63 114/73 108/63 109/66   10/14/22 1453 10/14/22 1933 10/15/22 0529 10/15/22 1429  BP: 118/70 123/68 109/65 122/65   10/15/22 2033 10/16/22 1349 10/16/22 2014 10/17/22 0421  BP: 107/65 105/67 112/62 (!) 90/56     9. Migraines: Has been managed with use of Topamax '75mg'$  QHS and Imitrex twice a month on average.  10.Transient hypokalemia: Resolved w/ supplement.  Monitor on weekly labs, next 10/13/22    Latest Ref Rng & Units 10/13/2022    7:13 AM 10/09/2022    6:44 AM 10/08/2022    8:17 AM  BMP  Glucose 70 - 99 mg/dL 136  98  123   BUN 6 - 20 mg/dL '9  7  7   '$ Creatinine 0.44 - 1.00  mg/dL 0.63  0.64  0.66   Sodium 135 - 145 mmol/L 136  138  137   Potassium 3.5 - 5.1 mmol/L 3.6  3.9  3.8   Chloride 98 - 111 mmol/L 109  109  107   CO2 22 - 32 mmol/L '22  21  22   '$ Calcium 8.9 - 10.3 mg/dL 9.0  9.1  9.1  11. Low vitamin D level: Ergocalciferol-21.7 on 08/25/22             --resume supplement 50000IU/week 12. Neutropenia: mild stable - cont to monitor weekly, stable on  10/13/22    Latest Ref Rng & Units 10/13/2022    7:13 AM 10/09/2022    6:44 AM 10/08/2022    8:17 AM  CBC  WBC 4.0 - 10.5 K/uL 3.6  3.2  3.8   Hemoglobin 12.0 - 15.0 g/dL 11.4  10.9  11.4   Hematocrit 36.0 - 46.0 % 36.0  33.7  36.6   Platelets 150 - 400 K/uL 197  153  171     13. Intermittent constipation: No issues so far. Used colace 2x /wk prn at home.   14.  Hyperlipidemia.  Continue Lipitor '20mg'$  QD 15. Hypothyroidism: cont Armour thyroid '180mg'$  QD 16. R calf pain: doppler negative, improved after stretch, pt with delayed onset muscle soreness resolving     LOS: 9 days A FACE TO FACE EVALUATION WAS PERFORMED  Charlett Blake 10/17/2022, 7:29 AM

## 2022-10-17 NOTE — Progress Notes (Signed)
Physical Therapy Session Note  Patient Details  Name: Donna Mccarthy MRN: [id number] Date of Birth: 07/07/1976  Today's Date: 10/17/2022 PT Individual Time: 1st Treatment Session: 1000-1100; 2nd Treatment Session: [id number] PT Individual Time Calculation (min): 60 min; 30 min  Short Term Goals: Week 1:  PT Short Term Goal 1 (Week 1): Pt will perform transfers with supervision assist and no AD PT Short Term Goal 2 (Week 1): Pt will ambulate >183f with supervision assist and LRAD PT Short Term Goal 3 (Week 1): Pt eill ascend 12 steps with CGA and 1 rails PT Short Term Goal 4 (Week 1): Pt will perform Berg balance scale  Skilled Therapeutic Interventions/Progress Updates:  1st Treatment Session- Patient greeted sitting upright in bedside recliner and agreeable to PT treatment session. Patient stood from recliner without AD and supv- Patient ambulated from her room, throughout the fourth floor, onto the elevator, throughout the 1st floor and outside on various surfaces. Patient ambulated outside over various uneven surfaces, up/down stairs, up/down a low grade ramp and across grass and mulch all without the use of an AD and supv. Patient required an extended seated rest break while inside secondary to fatigue and complaints of R knee pain- specifically along the medial aspect which is tender upon palpation. Later in treatment session, [facility] with positive test result on R LE- Physician notified and aware.   Discussed patient's job and the requirements she has throughout the day- Patient practiced sitting on and standing from a rolling stool (which patient will use at work at times), as well as scooting from simulated computer and patient with supervision for safety.   Patient tasked with picking up three various weighted dumbbells, two various weighted medicine balls and stacking 6 cones in order to practice squatting/kneeling down to assist with procedures. Patient was  able to complete all with supv and no LOB noted. Minor complaints of R knee pain with medial/lateral pivoting.   Patient ambulated back to her room without AD and supv for safety- Patient left sitting in bedside recliner with ice pack on R knee, call bell within reach and all needs met.    2nd Treatment Session- Patient greeted sitting in bedside recliner in room and agreeable to PT treatment session. Patient stood from bedside recliner without AD and supv and then ambulated from her room to rehab gym with supv.   Patient stood on airex foam pad and performed toe taps to various colored cones- Patient given one color initially and then progressed to four with good recall noted, only requiring minor cues. Therapist providing CGA for safety with minor LOBs noted, however patient was able to appropriately utilize her righting reactions. Patient performed x5 minutes with each foot, however unable to continue with SLS on R LE secondary to complaints of R knee pain.   Patient stood on airex foam without UE support and tossed a ball back and forth with rehab tech with CGA from therapist for improved safety. Patient completed this activity x5 minutes with appropriate use of ankle/hip strategy.   Patient ambulated back to her room without the use of an AD and supv- Patient left sitting in bedside recliner with call bell within reach and all needs met.    Therapy Documentation Precautions:  Precautions Precautions: Fall Precaution Comments: double vision Restrictions Weight Bearing Restrictions: No   Therapy/Group: Individual Therapy  Sentoria Brent 10/17/2022, 7:49 AM

## 2022-10-18 DIAGNOSIS — M79604 Pain in right leg: Secondary | ICD-10-CM

## 2022-10-18 NOTE — Progress Notes (Signed)
Physical Therapy Weekly Progress Note  Patient Details  Name: Donna Mccarthy MRN: [id number] Date of Birth: 10/27/1975  Beginning of progress report period: October 09, 2022 End of progress report period: October 18, 2022  Today's Date: 10/18/2022  Patient has met {number 1-5:22450} of {number 1-5:20334} short term goals.  Pt making appropriate progress towards goals and is on track to meet LTG. She has missed a few days of therapy due to refusal related to fatigue and difficulty achieving a full nights rest. She completes bed mobility with supervision, sit<>stand transfers with supervision, and stand<>pivot transfers with CGA and wide based quad cane [facility]). She's ambulating 80-134f with CGA and WBQC and has shown ability to navigate smaller 3inch 16 steps with CGA and 2 hand rails. She continues to be primarily limited by premorbid deconditioning, body habitus, decreased dynamic standing balance, RUE sensory deficits, RUE and RLE weakness, and gait impairments. *** has participated in family education to prepare for d/c home.    Patient continues to demonstrate the following deficits muscle weakness, decreased cardiorespiratoy endurance, unbalanced muscle activation and decreased coordination, decreased motor planning, and decreased balance strategies and therefore will continue to benefit from skilled PT intervention to increase functional independence with mobility.  Patient progressing toward long term goals..  Continue plan of care.  PT Short Term Goals Week 1:  PT Short Term Goal 1 (Week 1): Pt will perform transfers with supervision assist and no AD PT Short Term Goal 1 - Progress (Week 1): Met PT Short Term Goal 2 (Week 1): Pt will ambulate >1269fwith supervision assist and LRAD PT Short Term Goal 2 - Progress (Week 1): Met PT Short Term Goal 3 (Week 1): Pt eill ascend 12 steps with CGA and 1 rails PT Short Term Goal 3 - Progress (Week 1): Progressing toward goal PT Short Term Goal  4 (Week 1): Pt will perform Berg balance scale PT Short Term Goal 4 - Progress (Week 1): Met Week 2:  PT Short Term Goal 1 (Week 2): STG = LTG d/t ELOS  Skilled Therapeutic Interventions/Progress Updates:  Balance/vestibular training;Cognitive remediation/compensation;Discharge planning;DME/adaptive equipment instruction;Community reintegration;Ambulation/gait training;Neuromuscular re-education;Pain management;Functional mobility training;Patient/family education;Psychosocial support;Stair training;UE/LE Coordination activities;Therapeutic Activities;Therapeutic Exercise;Visual/perceptual remediation/compensation;UE/LE Strength taining/ROM   Therapy Documentation Precautions:  Precautions Precautions: Fall Precaution Comments: double vision Restrictions Weight Bearing Restrictions: No General:   Vital Signs: Therapy Vitals Temp: 98 F (36.7 C) Pulse Rate: 96 Resp: 18 BP: 114/68 Patient Position (if appropriate): Sitting Oxygen Therapy SpO2: 100 % O2 Device: Room Air Pain:    Therapy/Group: Individual Therapy  JuAlger SimonsT, DPT, CSRS 10/18/2022, 2:05 PM

## 2022-10-18 NOTE — Progress Notes (Signed)
Physical Therapy Session Note  Patient Details  Name: Donna Mccarthy MRN: [id number] Date of Birth: 16-Jul-1976  Today's Date: 10/18/2022 PT Individual Time: 0900-0930     Short Term Goals: Week 1:  PT Short Term Goal 1 (Week 1): Pt will perform transfers with supervision assist and no AD PT Short Term Goal 2 (Week 1): Pt will ambulate >133f with supervision assist and LRAD PT Short Term Goal 3 (Week 1): Pt eill ascend 12 steps with CGA and 1 rails PT Short Term Goal 4 (Week 1): Pt will perform Berg balance scale Week 2:     Skilled Therapeutic Interventions/Progress Updates:    Pt recived supine in bed in bed and agreeable to therapy. Pt reports a 2/10 pain in R knee and has been using heat and ice interchangeably to help. Pt donned shoes independently sitting at EOB. Ambulated to therapy gym w/ supervision. Pt performed 2 outcome measure today.   TUG  Trial 1: 14.67 Trial 2: 14.36 Trial 3: 13.54 Avg: 14.19  6MWT: 8855fPt ambulated the entire 6 minutes w/o stopping and w/ no AD. Supervision A provided. Therapist noted decreased velocity towards the last 2 minutes and an increase in pt's antalgic gait. Pt reported that RLE felt like it was "pulling away, but however denied pain.   Pt demonstrated an improved sit to stand pattern, and demonstrated her ability to stand up from a stool w/o LOB. Due to time, pt performed this 1x. Pt ambulated back to room and was left sitting in recliner w/ all needs met.     Therapy Documentation Precautions:  Precautions Precautions: Fall Precaution Comments: double vision Restrictions Weight Bearing Restrictions: No General:   Pain:         Therapy/Group: Individual Therapy  Kyanne Rials 10/18/2022, 9:21 AM

## 2022-10-18 NOTE — Progress Notes (Signed)
Speech Language Pathology Daily Session Note  Patient Details  Name: Donna Mccarthy MRN: [id number] Date of Birth: 03-Mar-1976  Today's Date: 10/18/2022 SLP Individual Time: 1300-1400 SLP Individual Time Calculation (min): 60 min  Short Term Goals: Week 2: SLP Short Term Goal 1 (Week 2): STG's = LTG's due to ELOS  Skilled Therapeutic Interventions: Pt seen for skilled ST with focus on cognitive goals, pt in recliner and pleasantly agreeable to therapeutic tasks. SLP facilitating mild-moderately complex thought organization/deductive reasoning task by providing mod fading to min A cues for 100% accuracy. Pt able to ID and repair errors ~50% of the time, most common error was writing information in incorrect spot. Patient provided laptop to write mock email to boss, completing accurately and timely with Mod I. Pt able to ID and self-correct typing errors 100% independently. Pt demonstrates adequate insight and awareness into deficits s/p CVA and recommendations for discharge. Pt left in chair with all needs met, cont ST POC.   Pain Pain Assessment Pain Scale: 0-10 Pain Score: 0-No pain  Therapy/Group: Individual Therapy  Dewaine Conger 10/18/2022, 2:13 PM

## 2022-10-18 NOTE — Progress Notes (Signed)
PROGRESS NOTE   Subjective/Complaints:  Doing well today. R calf pain improved, R thigh/knee area pain improving as well. Slept well. Urinating well, LBM yesterday. Denies any other complaints or concerns today.    ROS:  +R leg pain-improving. Denies fevers, chills, CP, SOB, abd pain, N/V/D/C  Objective:   No results found. No results for input(s): "WBC", "HGB", "HCT", "PLT" in the last 72 hours.   No results for input(s): "NA", "K", "CL", "CO2", "GLUCOSE", "BUN", "CREATININE", "CALCIUM" in the last 72 hours.    Intake/Output Summary (Last 24 hours) at 10/18/2022 0721 Last data filed at 10/17/2022 1700 Gross per 24 hour  Intake 413 ml  Output --  Net 413 ml         Physical Exam: Vital Signs Blood pressure (!) 104/59, pulse 69, temperature 98.8 F (37.1 C), temperature source Oral, resp. rate 17, height '5\' 5"'$  (1.651 m), weight 82.6 kg, last menstrual period 09/22/2022, SpO2 100 %.   General: No acute distress, sitting in chair Mood and affect are appropriate Heart: Regular rate and rhythm no rubs murmurs or extra sounds Lungs: Clear to auscultation, breathing unlabored, no rales or wheezes Abdomen: Positive bowel sounds, soft nontender to palpation, nondistended Extremities: No clubbing, cyanosis, or edema. No tenderness in RIght quad or R calf today   PRIOR EXAM: + tenderness over the RIght patellar tendon   5/5 in BUE and LLE 4- Right HF, KE, ADF (?full effort)  Sensation now able to identify LT below the knee   Eyes without evidence of nystagmus  Tone is normal without evidence of spasticity      Assessment/Plan: 1. Functional deficits which require 3+ hours per day of interdisciplinary therapy in a comprehensive inpatient rehab setting. Physiatrist is providing close team supervision and 24 hour management of active medical problems listed below. Physiatrist and rehab team continue to assess barriers  to discharge/monitor patient progress toward functional and medical goals  Care Tool:  Bathing    Body parts bathed by patient: Right arm, Left arm, Chest, Front perineal area, Abdomen, Buttocks, Left upper leg, Right upper leg, Right lower leg, Face, Left lower leg         Bathing assist Assist Level: Independent with assistive device     Upper Body Dressing/Undressing Upper body dressing   What is the patient wearing?: Pull over shirt, Bra    Upper body assist Assist Level: Independent    Lower Body Dressing/Undressing Lower body dressing      What is the patient wearing?: Underwear/pull up, Pants     Lower body assist Assist for lower body dressing: Independent     Toileting Toileting    Toileting assist Assist for toileting: Independent     Transfers Chair/bed transfer  Transfers assist     Chair/bed transfer assist level: Supervision/Verbal cueing     Locomotion Ambulation   Ambulation assist      Assist level: Supervision/Verbal cueing Assistive device: No Device Max distance: 30   Walk 10 feet activity   Assist     Assist level: Supervision/Verbal cueing Assistive device: No Device   Walk 50 feet activity   Assist    Assist level: Supervision/Verbal  cueing Assistive device: No Device    Walk 150 feet activity   Assist Walk 150 feet activity did not occur: Safety/medical concerns  Assist level: Supervision/Verbal cueing Assistive device: No Device    Walk 10 feet on uneven surface  activity   Assist     Assist level: Moderate Assistance - Patient - 50 - 74%     Wheelchair     Assist Is the patient using a wheelchair?: Yes Type of Wheelchair: Manual    Wheelchair assist level: Supervision/Verbal cueing Max wheelchair distance: 150    Wheelchair 50 feet with 2 turns activity    Assist        Assist Level: Supervision/Verbal cueing   Wheelchair 150 feet activity     Assist      Assist  Level: Supervision/Verbal cueing   Blood pressure (!) 104/59, pulse 69, temperature 98.8 F (37.1 C), temperature source Oral, resp. rate 17, height '5\' 5"'$  (1.651 m), weight 82.6 kg, last menstrual period 09/22/2022, SpO2 100 %.  Medical Problem List and Plan: 1. 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  Antithrombotics: -DVT/anticoagulation: Doppler negative  Pharmaceutical: Lovenox '40mg'$  QD -antiplatelet therapy: DAPT (ASA '81mg'$  QD/Plavix '75mg'$  QD) for 3 weeks followed by aspirin alone (end date: 10/24/22) 3. Pain Management: Tylenol PRN  -10/11/22 Kpad ordered for thigh soreness, cont muscle rub cream -Pain localizing to RIght patellar tendon- cont voltaren gel  4. Mood/Behavior/Sleep: LCSW to follow for evaluation and support.              --trazodone prn for chronic insomnia (melatonin not very effective)             -antipsychotic agents: N/A  -wellbutrin '300mg'$  QD 5. Neuropsych/cognition: This patient is capable of making decisions on her own behalf. 6. Skin/Wound Care:  Routine pressure relief measures 7. Fluids/Electrolytes/Nutrition:  Monitor I/O. Stable labs 10/09/22. Monitor weekly labs, next 10/20/22 8. Hypotension: SBP soft but increasing   Monitor for orthostatic symptoms  -10/18/22 BP stable, monitor Vitals:   10/14/22 0511 10/14/22 1453 10/14/22 1933 10/15/22 0529  BP: 109/66 118/70 123/68 109/65   10/15/22 1429 10/15/22 2033 10/16/22 1349 10/16/22 2014  BP: 122/65 107/65 105/67 112/62   10/17/22 0421 10/17/22 1254 10/17/22 2010 10/18/22 0430  BP: (!) 90/56 (!) 111/93 112/76 (!) 104/59     9. Migraines: Has been managed with use of Topamax '75mg'$  QHS and Imitrex twice a month on average.   -Cont Fioricet 1 tab q12h PRN, Topamax '75mg'$  QHS 10.Transient  hypokalemia: Resolved w/ supplement.  Monitor on weekly labs, next 10/20/22    Latest Ref Rng & Units 10/13/2022    7:13 AM 10/09/2022    6:44 AM 10/08/2022    8:17 AM  BMP  Glucose 70 - 99 mg/dL 136  98  123   BUN 6 - 20 mg/dL '9  7  7   '$ Creatinine 0.44 - 1.00 mg/dL 0.63  0.64  0.66   Sodium 135 - 145 mmol/L 136  138  137   Potassium 3.5 - 5.1 mmol/L 3.6  3.9  3.8  Chloride 98 - 111 mmol/L 109  109  107   CO2 22 - 32 mmol/L '22  21  22   '$ Calcium 8.9 - 10.3 mg/dL 9.0  9.1  9.1     11. Low vitamin D level: Ergocalciferol-21.7 on 08/25/22             --resume supplement 50000IU/week 12. Neutropenia: mild stable - cont to monitor weekly, stable on  10/13/22, monitor weekly labs, next 10/20/22    Latest Ref Rng & Units 10/13/2022    7:13 AM 10/09/2022    6:44 AM 10/08/2022    8:17 AM  CBC  WBC 4.0 - 10.5 K/uL 3.6  3.2  3.8   Hemoglobin 12.0 - 15.0 g/dL 11.4  10.9  11.4   Hematocrit 36.0 - 46.0 % 36.0  33.7  36.6   Platelets 150 - 400 K/uL 197  153  171     13. Intermittent constipation: No issues so far. Used colace 2x /wk prn at home.  -10/18/22 LBM yesterday, cont regimen (PRNs: Colace, Miralax, Dulcolax supp, Senokot, Fleet's) 14.  Hyperlipidemia.  Continue Lipitor '20mg'$  QD 15. Hypothyroidism: cont Armour thyroid '180mg'$  QD 16. R calf pain: doppler negative, improved after stretch, pt with delayed onset muscle soreness resolving     LOS: 10 days A FACE TO [place] 10/18/2022, 7:21 AM

## 2022-10-19 MED ORDER — THYROID 60 MG PO TABS
180.0000 mg | ORAL_TABLET | Freq: Every day | ORAL | Status: DC
  Administered 2022-10-20 – 2022-10-22 (×3): 180 mg via ORAL
  Filled 2022-10-19 (×3): qty 3

## 2022-10-19 NOTE — Progress Notes (Signed)
PROGRESS NOTE   Subjective/Complaints:  Doing well again today. RLE pain continues to improve. Slept well. Urinating well, LBM yesterday.  Wants to know about meds she'll go home with-- concerned mostly with the Lipitor. Discussed LDL value of 83 which is good but her goal is <70 per neurology. Advised to discuss this med with weekday team or PCP after d/c.  Also concerned with thyroid meds being given after breakfast, and what that might do to her thyroid numbers.  Denies any other complaints or concerns today.    ROS:  +R leg pain-improving. Denies fevers, chills, CP, SOB, abd pain, N/V/D/C  Objective:   No results found. No results for input(s): "WBC", "HGB", "HCT", "PLT" in the last 72 hours.   No results for input(s): "NA", "K", "CL", "CO2", "GLUCOSE", "BUN", "CREATININE", "CALCIUM" in the last 72 hours.    Intake/Output Summary (Last 24 hours) at 10/19/2022 0714 Last data filed at 10/18/2022 0730 Gross per 24 hour  Intake 240 ml  Output --  Net 240 ml         Physical Exam: Vital Signs Blood pressure 113/62, pulse 76, temperature 98.3 F (36.8 C), temperature source Oral, resp. rate 18, height '5\' 5"'$  (1.651 m), weight 82.6 kg, last menstrual period 09/22/2022, SpO2 99 %.   General: No acute distress, sitting in bed Mood and affect are appropriate Heart: Regular rate and rhythm no rubs murmurs or extra sounds Lungs: Clear to auscultation, breathing unlabored, no rales or wheezes Abdomen: Positive bowel sounds, soft nontender to palpation, nondistended Extremities: No clubbing, cyanosis, or edema. No tenderness in Right quad or R calf today   PRIOR EXAM: + tenderness over the RIght patellar tendon   5/5 in BUE and LLE 4- Right HF, KE, ADF (?full effort)  Sensation now able to identify LT below the knee   Eyes without evidence of nystagmus  Tone is normal without evidence of spasticity       Assessment/Plan: 1. Functional deficits which require 3+ hours per day of interdisciplinary therapy in a comprehensive inpatient rehab setting. Physiatrist is providing close team supervision and 24 hour management of active medical problems listed below. Physiatrist and rehab team continue to assess barriers to discharge/monitor patient progress toward functional and medical goals  Care Tool:  Bathing    Body parts bathed by patient: Right arm, Left arm, Chest, Front perineal area, Abdomen, Buttocks, Left upper leg, Right upper leg, Right lower leg, Face, Left lower leg         Bathing assist Assist Level: Independent with assistive device     Upper Body Dressing/Undressing Upper body dressing   What is the patient wearing?: Pull over shirt, Bra    Upper body assist Assist Level: Independent    Lower Body Dressing/Undressing Lower body dressing      What is the patient wearing?: Underwear/pull up, Pants     Lower body assist Assist for lower body dressing: Independent     Toileting Toileting    Toileting assist Assist for toileting: Independent     Transfers Chair/bed transfer  Transfers assist     Chair/bed transfer assist level: Supervision/Verbal cueing     Locomotion Ambulation  Ambulation assist      Assist level: Supervision/Verbal cueing Assistive device: No Device Max distance: 30   Walk 10 feet activity   Assist     Assist level: Supervision/Verbal cueing Assistive device: No Device   Walk 50 feet activity   Assist    Assist level: Supervision/Verbal cueing Assistive device: No Device    Walk 150 feet activity   Assist Walk 150 feet activity did not occur: Safety/medical concerns  Assist level: Supervision/Verbal cueing Assistive device: No Device    Walk 10 feet on uneven surface  activity   Assist     Assist level: Moderate Assistance - Patient - 50 - 74%     Wheelchair     Assist Is the patient  using a wheelchair?: Yes Type of Wheelchair: Manual    Wheelchair assist level: Supervision/Verbal cueing Max wheelchair distance: 150    Wheelchair 50 feet with 2 turns activity    Assist        Assist Level: Supervision/Verbal cueing   Wheelchair 150 feet activity     Assist      Assist Level: Supervision/Verbal cueing   Blood pressure 113/62, pulse 76, temperature 98.3 F (36.8 C), temperature source Oral, resp. rate 18, height '5\' 5"'$  (1.651 m), weight 82.6 kg, last menstrual period 09/22/2022, SpO2 99 %.  Medical Problem List and Plan: 1. Functional deficits secondary to brainstem stroke without radiologic abnormalities, Left central 7   - CVA risk factors include OC             -patient may  shower             -ELOS/Goals:10/23/22    PT OT supervision to mod I, SLP mod I  -Cont CIR PT, OT        2.  Antithrombotics: -DVT/anticoagulation: Doppler negative  Pharmaceutical: Lovenox '40mg'$  QD -antiplatelet therapy: DAPT (ASA '81mg'$  QD/Plavix '75mg'$  QD) for 3 weeks followed by aspirin alone (end date: 10/24/22) 3. Pain Management: Tylenol PRN  -10/11/22 Kpad ordered for thigh soreness, cont muscle rub cream -Pain localizing to RIght patellar tendon- cont voltaren gel  4. Mood/Behavior/Sleep: LCSW to follow for evaluation and support.              --trazodone prn for chronic insomnia (melatonin not very effective)             -antipsychotic agents: N/A  -wellbutrin '300mg'$  QD 5. Neuropsych/cognition: This patient is capable of making decisions on her own behalf. 6. Skin/Wound Care:  Routine pressure relief measures 7. Fluids/Electrolytes/Nutrition:  Monitor I/O. Stable labs 10/09/22. Monitor weekly labs, next 10/20/22 8. Hypotension: SBP soft but increasing   Monitor for orthostatic symptoms  -10/19/22 BP stable, monitor Vitals:   10/15/22 0529 10/15/22 1429 10/15/22 2033 10/16/22 1349  BP: 109/65 122/65 107/65 105/67   10/16/22 2014 10/17/22 0421 10/17/22 1254 10/17/22 2010   BP: 112/62 (!) 90/56 (!) 111/93 112/76   10/18/22 0430 10/18/22 1340 10/18/22 2024 10/19/22 0443  BP: (!) 104/59 114/68 114/67 113/62     9. Migraines: Has been managed with use of Topamax '75mg'$  QHS and Imitrex twice a month on average.   -Cont Fioricet 1 tab q12h PRN, Topamax '75mg'$  QHS 10.Transient hypokalemia: Resolved w/ supplement.  Monitor on weekly labs, next 10/20/22    Latest Ref Rng & Units 10/13/2022    7:13 AM 10/09/2022    6:44 AM 10/08/2022    8:17 AM  BMP  Glucose 70 - 99 mg/dL 136  98  123  BUN 6 - 20 mg/dL '9  7  7   '$ Creatinine 0.44 - 1.00 mg/dL 0.63  0.64  0.66   Sodium 135 - 145 mmol/L 136  138  137   Potassium 3.5 - 5.1 mmol/L 3.6  3.9  3.8   Chloride 98 - 111 mmol/L 109  109  107   CO2 22 - 32 mmol/L '22  21  22   '$ Calcium 8.9 - 10.3 mg/dL 9.0  9.1  9.1     11. Low vitamin D level: Ergocalciferol-21.7 on 08/25/22             --resume supplement 50000IU/week 12. Neutropenia: mild stable - cont to monitor weekly, stable on  10/13/22, monitor weekly labs, next 10/20/22    Latest Ref Rng & Units 10/13/2022    7:13 AM 10/09/2022    6:44 AM 10/08/2022    8:17 AM  CBC  WBC 4.0 - 10.5 K/uL 3.6  3.2  3.8   Hemoglobin 12.0 - 15.0 g/dL 11.4  10.9  11.4   Hematocrit 36.0 - 46.0 % 36.0  33.7  36.6   Platelets 150 - 400 K/uL 197  153  171     13. Intermittent constipation: No issues so far. Used colace 2x /wk prn at home.  -10/19/22 LBM yesterday, cont regimen (PRNs: Colace, Miralax, Dulcolax supp, Senokot, Fleet's) 14.  Hyperlipidemia (Cholesterol 129, HDL 40, LDL 83, TG 28 on 10/04/22). Per neurology notes, LDL GOAL <70. Continue Lipitor '20mg'$  QD -10/19/22 pt wants to know if she needs this long term-- advised discussing with weekday team vs PCP as outpatient 15. Hypothyroidism: cont Armour thyroid '180mg'$  QD (changed schedule to be DAILY BEFORE BREAKFAST since this is how it should ideally be given) 16. R calf pain: doppler negative, improved after stretch, pt with delayed onset  muscle soreness resolving, cont voltaren gel and Kpad  I spent >50 mins with this patient, both face to face discussing medications and overall discharge questions, reviewing labs/prior notes, and documenting.     LOS: 11 days A FACE TO [place] 10/19/2022, 7:14 AM

## 2022-10-20 DIAGNOSIS — I950 Idiopathic hypotension: Secondary | ICD-10-CM

## 2022-10-20 LAB — BASIC METABOLIC PANEL
Anion gap: 4 — ABNORMAL LOW (ref 5–15)
BUN: 6 mg/dL (ref 6–20)
CO2: 21 mmol/L — ABNORMAL LOW (ref 22–32)
Calcium: 8.8 mg/dL — ABNORMAL LOW (ref 8.9–10.3)
Chloride: 111 mmol/L (ref 98–111)
Creatinine, Ser: 0.67 mg/dL (ref 0.44–1.00)
GFR, Estimated: >60 mL/min (ref >60)
Glucose, Bld: 90 mg/dL (ref 70–99)
Potassium: 3.5 mmol/L (ref 3.5–5.1)
Sodium: 136 mmol/L (ref 135–145)

## 2022-10-20 LAB — CBC
HCT: 32.9 % — ABNORMAL LOW (ref 36.0–46.0)
Hemoglobin: 10.4 g/dL — ABNORMAL LOW (ref 12.0–15.0)
MCH: 26.3 pg (ref 26.0–34.0)
MCHC: 31.6 g/dL (ref 30.0–36.0)
MCV: 83.3 fL (ref 80.0–100.0)
Platelets: 194 10*3/uL (ref 150–400)
RBC: 3.95 MIL/uL (ref 3.87–5.11)
RDW: 13.8 % (ref 11.5–15.5)
WBC: 4 10*3/uL (ref 4.0–10.5)
nRBC: 0 % (ref 0.0–0.2)

## 2022-10-20 NOTE — Progress Notes (Signed)
Patient ID: Donna Mccarthy, female   DOB: 1975-10-28, 47 y.o.   MRN: [id number]  TTB cancelled through [employer].

## 2022-10-20 NOTE — Progress Notes (Addendum)
Speech Language Pathology Daily Session Note  Patient Details  Name: Donna Mccarthy MRN: [id number] Date of Birth: 07-11-1976  Today's Date: 10/20/2022 SLP Individual Time: 1000-1100 SLP Individual Time Calculation (min): 60 min  Short Term Goals: Week 2: SLP Short Term Goal 1 (Week 2): STG's = LTG's due to ELOS  Skilled Therapeutic Interventions: Pt was seen in am to address cognitive re- training. Pt was alert and seated upright in recliner upon SLP arrival. She was agreeable for session. SLP engaged pt in conversation regarding ELOS. Pt reports concern regarding transition back to work and driving. SLP engaged pt in identifying potential barriers to transition to work. Pt able to identify solutions to problems given min to mod verbal cues. Pt able to teachback information with 75% acc indep. SLP further trained pt in use of WRAP (write it down, repetition, association, and picturing it) compensatory strategies. In additional minutes of session, SLP engaged pt in role play for her job. Pt completed tasks including completing intake form with 100% acc with min cues. Pt was left seated upright in recliner with call button within reach.      Pain  No pain  Therapy/Group: Individual Therapy  Colin Benton 10/20/2022, 2:55 PM

## 2022-10-20 NOTE — Progress Notes (Signed)
PROGRESS NOTE   Subjective/Complaints:  Discussed discharge medications again with pt including lipitor. Had questions about work, driving. Has had some intermittent twitching in left face.   ROS: Patient denies fever, rash, sore throat, blurred vision, dizziness, nausea, vomiting, diarrhea, cough, shortness of breath or chest pain, joint or back/neck pain, headache, or mood change.   Objective:   No results found. Recent Labs    10/20/22 0555  WBC 4.0  HGB 10.4*  HCT 32.9*  PLT 194    Recent Labs    10/20/22 0555  NA 136  K 3.5  CL 111  CO2 21*  GLUCOSE 90  BUN 6  CREATININE 0.67  CALCIUM 8.8*     Intake/Output Summary (Last 24 hours) at 10/20/2022 1314 Last data filed at 10/20/2022 0810 Gross per 24 hour  Intake 480 ml  Output --  Net 480 ml        Physical Exam: Vital Signs Blood pressure 100/62, pulse 85, temperature 97.9 F (36.6 C), resp. rate 18, height '5\' 5"'$  (1.651 m), weight 82.6 kg, last menstrual period 09/22/2022, SpO2 98 %.   Constitutional: No distress . Vital signs reviewed. HEENT: NCAT, EOMI, oral membranes moist Neck: supple Cardiovascular: RRR without murmur. No JVD    Respiratory/Chest: CTA Bilaterally without wheezes or rales. Normal effort    GI/Abdomen: BS +, non-tender, non-distended Ext: no clubbing, cyanosis, or edema Psych: pleasant and cooperative  Musc: Minimal tenderness over the RIght patellar tendon  Neuro: Alert and oriented x 3. Normal insight and awareness. Intact Memory. Normal language and speech. Cranial nerve exam unremarkable  5/5 in BUE and LLE 4- Right HF, KE, ADF (?full effort)  Sensation now able to identify LT below the knee   Eyes without evidence of nystagmus  Tone is normal without evidence of spasticity      Assessment/Plan: 1. Functional deficits which require 3+ hours per day of interdisciplinary therapy in a comprehensive inpatient rehab  setting. Physiatrist is providing close team supervision and 24 hour management of active medical problems listed below. Physiatrist and rehab team continue to assess barriers to discharge/monitor patient progress toward functional and medical goals  Care Tool:  Bathing    Body parts bathed by patient: Right arm, Left arm, Chest, Front perineal area, Abdomen, Buttocks, Left upper leg, Right upper leg, Right lower leg, Face, Left lower leg         Bathing assist Assist Level: Independent with assistive device     Upper Body Dressing/Undressing Upper body dressing   What is the patient wearing?: Pull over shirt, Bra    Upper body assist Assist Level: Independent    Lower Body Dressing/Undressing Lower body dressing      What is the patient wearing?: Underwear/pull up, Pants     Lower body assist Assist for lower body dressing: Independent     Toileting Toileting    Toileting assist Assist for toileting: Independent     Transfers Chair/bed transfer  Transfers assist     Chair/bed transfer assist level: Independent     Locomotion Ambulation   Ambulation assist      Assist level: Supervision/Verbal cueing Assistive device: No Device Max distance:  30   Walk 10 feet activity   Assist     Assist level: Supervision/Verbal cueing Assistive device: No Device   Walk 50 feet activity   Assist    Assist level: Supervision/Verbal cueing Assistive device: No Device    Walk 150 feet activity   Assist Walk 150 feet activity did not occur: Safety/medical concerns  Assist level: Supervision/Verbal cueing Assistive device: No Device    Walk 10 feet on uneven surface  activity   Assist     Assist level: Moderate Assistance - Patient - 50 - 74%     Wheelchair     Assist Is the patient using a wheelchair?: Yes Type of Wheelchair: Manual    Wheelchair assist level: Supervision/Verbal cueing Max wheelchair distance: 150    Wheelchair  50 feet with 2 turns activity    Assist        Assist Level: Supervision/Verbal cueing   Wheelchair 150 feet activity     Assist      Assist Level: Supervision/Verbal cueing   Blood pressure 100/62, pulse 85, temperature 97.9 F (36.6 C), resp. rate 18, height '5\' 5"'$  (1.651 m), weight 82.6 kg, last menstrual period 09/22/2022, SpO2 98 %.  Medical Problem List and Plan: 1. Functional deficits secondary to brainstem stroke without radiologic abnormalities, Left central 7   - CVA risk factors include OC             -patient may  shower             -ELOS/Goals:10/23/22    PT OT supervision to mod I, SLP mod I  -Continue CIR therapies including PT, OT   -discussed discharge follow up and meds with pt       2.  Antithrombotics: -DVT/anticoagulation: Doppler negative  Pharmaceutical: Lovenox '40mg'$  QD -antiplatelet therapy: DAPT (ASA '81mg'$  QD/Plavix '75mg'$  QD) for 3 weeks followed by aspirin alone (end date: 10/24/22) 3. Pain Management: Tylenol PRN  -10/11/22 Kpad ordered for thigh soreness, cont muscle rub cream -Pain localizing to RIght patellar tendon- cont voltaren gel--improved  4. Mood/Behavior/Sleep: LCSW to follow for evaluation and support.              --trazodone prn for chronic insomnia (melatonin not very effective)             -antipsychotic agents: N/A  -wellbutrin '300mg'$  QD 5. Neuropsych/cognition: This patient is capable of making decisions on her own behalf. 6. Skin/Wound Care:  Routine pressure relief measures 7. Fluids/Electrolytes/Nutrition:  Monitor I/O. Stable labs 10/09/22.    -I personally reviewed the patient's labs today.  8. Hypotension: SBP soft but increasing   Monitor for orthostatic symptoms  -10/20/22 BP still soft but stable, monitor Vitals:   10/16/22 1349 10/16/22 2014 10/17/22 0421 10/17/22 1254  BP: 105/67 112/62 (!) 90/56 (!) 111/93   10/17/22 2010 10/18/22 0430 10/18/22 1340 10/18/22 2024  BP: 112/76 (!) 104/59 114/68 114/67   10/19/22 0443  10/19/22 1249 10/19/22 2017 10/20/22 0542  BP: 113/62 115/70 109/63 100/62     9. Migraines: Has been managed with use of Topamax '75mg'$  QHS and Imitrex twice a month on average.   -Cont Fioricet 1 tab q12h PRN, Topamax '75mg'$  QHS 10.Transient hypokalemia: Resolved w/ supplement.  Monitor on weekly labs, next 10/20/22    Latest Ref Rng & Units 10/20/2022    5:55 AM 10/13/2022    7:13 AM 10/09/2022    6:44 AM  BMP  Glucose 70 - 99 mg/dL 90  136  98  BUN 6 - 20 mg/dL '6  9  7   '$ Creatinine 0.44 - 1.00 mg/dL 0.67  0.63  0.64   Sodium 135 - 145 mmol/L 136  136  138   Potassium 3.5 - 5.1 mmol/L 3.5  3.6  3.9   Chloride 98 - 111 mmol/L 111  109  109   CO2 22 - 32 mmol/L '21  22  21   '$ Calcium 8.9 - 10.3 mg/dL 8.8  9.0  9.1     11. Low vitamin D level: Ergocalciferol-21.7 on 08/25/22             --resume supplement 50000IU/week 12. Neutropenia/anemia:3/4  hgb down today. No s/s--follow up cbc as outpt unless there is gross bleeding here on rehab this week    Latest Ref Rng & Units 10/20/2022    5:55 AM 10/13/2022    7:13 AM 10/09/2022    6:44 AM  CBC  WBC 4.0 - 10.5 K/uL 4.0  3.6  3.2   Hemoglobin 12.0 - 15.0 g/dL 10.4  11.4  10.9   Hematocrit 36.0 - 46.0 % 32.9  36.0  33.7   Platelets 150 - 400 K/uL 194  197  153     13. Intermittent constipation: No issues so far. Used colace 2x /wk prn at home.  -LBM 3/4. cont regimen (PRNs: Colace, Miralax, Dulcolax supp, Senokot, Fleet's) 14.  Hyperlipidemia (Cholesterol 129, HDL 40, LDL 83, TG 28 on 10/04/22). Per neurology notes, LDL GOAL <70. Continue Lipitor '20mg'$  QD -3/4 discussed with pt 15. Hypothyroidism: cont Armour thyroid '180mg'$  QD (changed schedule to be DAILY BEFORE BREAKFAST since this is how it should ideally be given) 16. R calf pain: doppler negative, improved after stretch, pt with delayed onset muscle soreness resolving, cont voltaren gel and Kpad       LOS: 12 days A FACE TO FACE EVALUATION WAS PERFORMED  Meredith Staggers 10/20/2022,  1:14 PM

## 2022-10-20 NOTE — Progress Notes (Signed)
Occupational Therapy Session Note  Patient Details  Name: Donna Mccarthy MRN: [id number] Date of Birth: July 27, 1976  Today's Date: 10/20/2022 OT Individual Time: 1347-1416 OT Individual Time Calculation (min): 29 min    Short Term Goals: Week 2:  OT Short Term Goal 1 (Week 2): STGs = LTGs  Skilled Therapeutic Interventions/Progress Updates:  Pt greeted seated in recliner, pt agreeable to OT intervention. Pt completed functional mobility down to gym with no AD with distant supervision. Pt completed simualted IADL task where pt able to simulate bringing groceries in from front door I.e grocery delievery service. Pt able to step up onto curb step to simulate porch, bend down to retrieve reusable bags with weights in bags ranging from 3-6 lbs to simulate grocery items and carry across simulated threshold to set on the mat table. Pt completed task with distant supervision.   Pt even able to carry all bags at once down to apt and put heavy weights in [facility] cabinets to simulate putting away groceries. Pt completed floor transfer from EOM with pt able to roll from supine>sidelying>long sitting> quadraped> tall kneeling> one knee and then to standing. Pt reports she has a bin underneath her bed that she keeps extra scrubs in and wanted to make sure she could safely pull bin out from underneath bed. Pt able to remove scooter board out from under mat table to simulate pulling out bin with supervision.                   Ended session with pt seated in recliner, pt independent in room, Nurse aware.             Therapy Documentation Precautions:  Precautions Precautions: Fall Precaution Comments: double vision Restrictions Weight Bearing Restrictions: No  Pain: No pain     Therapy/Group: Individual Therapy  Precious Haws 10/20/2022, 3:49 PM

## 2022-10-20 NOTE — Progress Notes (Signed)
Occupational Therapy Session Note  Patient Details  Name: Donna Mccarthy MRN: [id number] Date of Birth: 08/12/1976  Today's Date: 10/20/2022 OT Individual Time: [id number] OT Individual Time Calculation (min): 75 min    Short Term Goals: Week 1:  OT Short Term Goal 1 (Week 1): Patient will ambulate with device and min A to transition into bathroom. OT Short Term Goal 1 - Progress (Week 1): Met OT Short Term Goal 2 (Week 1): Patient will maintain dynamic standing balance within functional BADL task with CGA OT Short Term Goal 2 - Progress (Week 1): Met OT Short Term Goal 3 (Week 1): Patient will don shoes with set-set-up A. OT Short Term Goal 3 - Progress (Week 1): Met Week 2:  OT Short Term Goal 1 (Week 2): STGs = LTGs  Skilled Therapeutic Interventions/Progress Updates:    Pt received in room ready for therapy. Pt completed all self care tasks independently.  Pt ambulated to kitchen to work on a cooking task of making roasted chickpeas as pt is a vegetarian. (OT preheated oven while pt in shower), Pt followed steps of recipe well, using good safety awareness. While peas were roasting, pt worked on series of standing exercises in kitchen (heel raises, squats, hip abduction). Pt then washed dishes and tolerated standing the entire session.   Pt worked on Web designer task on [place] working on 5 letter recall (80% accuracy) or 5 image recall (62% accuracy).    Pt ambulated back to room with all needs met.   Therapy Documentation Precautions:  Precautions Precautions: Fall Precaution Comments: double vision Restrictions Weight Bearing Restrictions: No    Pain: mild discomfort in R thigh      Therapy/Group: Individual Therapy  [place] 10/20/2022, 10:35 AM

## 2022-10-20 NOTE — Progress Notes (Signed)
Patient ID: Donna Mccarthy, female   DOB: 22-Oct-1975, 47 y.o.   MRN: [id number]  Patient discharge moved up to 3/6.

## 2022-10-20 NOTE — Progress Notes (Signed)
Physical Therapy Session Note  Patient Details  Name: Donna Mccarthy MRN: [id number] Date of Birth: 07-Oct-1975  Today's Date: 10/20/2022 PT Individual Time: [id number] PT Total Treatment Time: 44 minutes      Short Term Goals: Week 1:  PT Short Term Goal 1 (Week 1): Pt will perform transfers with supervision assist and no AD PT Short Term Goal 1 - Progress (Week 1): Met PT Short Term Goal 2 (Week 1): Pt will ambulate >121f with supervision assist and LRAD PT Short Term Goal 2 - Progress (Week 1): Met PT Short Term Goal 3 (Week 1): Pt eill ascend 12 steps with CGA and 1 rails PT Short Term Goal 3 - Progress (Week 1): Progressing toward goal PT Short Term Goal 4 (Week 1): Pt will perform Berg balance scale PT Short Term Goal 4 - Progress (Week 1): Met Week 2:  PT Short Term Goal 1 (Week 2): STG = LTG d/t ELOS      Skilled Therapeutic Interventions/Progress Updates:    Pt received supine w/ HOB elevated and agreeable to PT services. Pt donned shoes independently in sitting and ambulated to the therapy gym w/o AD and therapist there for supervision. Pt continues to ambulate w/ a mild antalgic gait. Denies any R knee pain today, and continues to use heat and ice as needed.   Therapist reassessed CARE Tool w/ pt. Pt ascended and descended 12 stairs x2 w/ supervision A using RHR. Pt demonstrated a functional step-through pattern. Expressed that she had some fear when descending, however, felt that she was in control. Pt performed car transfer w/ supervision A.   Worked on therapeutic activities designed to simulate the pt's occupation and the demands of her job. Pt pushed WC w/ therapist sitting in it ~1039fand ~20056f/ no weight. Pt demonstrated good control of the WC and confidence w/ the movement. Pt instructed to rearrange a objects of various sizes designed to simulate a cluttered office space. Pt navigated through the obstacles and rearranged the objects back to where they belonged w/  supervision assist.   Navigating obstacles using tidal tank to challenge balance. Pt tasked w/ stepping over two cones, walking over airex pad, and side stepping over 2 cones 3x40 ft. Pt cued to move slowly to allow her time to find stability when stepping over cones. Pt performed exercise w/ supervision assist.   Therapy Documentation Precautions:  Precautions Precautions: Fall Precaution Comments: double vision Restrictions Weight Bearing Restrictions: No General:   Pain: denies any pain        Therapy/Group: Individual Therapy  Rossie Bretado 10/20/2022, 11:20 AM

## 2022-10-21 ENCOUNTER — Other Ambulatory Visit (HOSPITAL_COMMUNITY): Payer: Self-pay

## 2022-10-21 MED ORDER — ASPIRIN 81 MG PO CHEW
81.0000 mg | CHEWABLE_TABLET | Freq: Every day | ORAL | 0 refills | Status: DC
  Filled 2022-10-21: qty 30, 30d supply, fill #0

## 2022-10-21 MED ORDER — MUSCLE RUB 10-15 % EX CREA: 1.0000 | TOPICAL_CREAM | CUTANEOUS | 0 refills | Status: DC | PRN

## 2022-10-21 MED ORDER — PANTOPRAZOLE SODIUM 40 MG PO TBEC
40.0000 mg | DELAYED_RELEASE_TABLET | Freq: Every day | ORAL | 0 refills | Status: DC
  Filled 2022-10-21: qty 30, 30d supply, fill #0

## 2022-10-21 MED ORDER — BUTALBITAL-APAP-CAFFEINE 50-325-40 MG PO TABS
1.0000 | ORAL_TABLET | Freq: Every day | ORAL | 0 refills | Status: DC | PRN
  Filled 2022-10-21: qty 5, 5d supply, fill #0

## 2022-10-21 MED ORDER — DICLOFENAC SODIUM 1 % EX GEL
2.0000 g | Freq: Four times a day (QID) | CUTANEOUS | 0 refills | Status: DC
  Filled 2022-10-21: qty 100, 14d supply, fill #0
  Filled 2023-05-01: qty 100, 14d supply, fill #1

## 2022-10-21 MED ORDER — ATORVASTATIN CALCIUM 20 MG PO TABS
20.0000 mg | ORAL_TABLET | Freq: Every day | ORAL | 0 refills | Status: DC
  Filled 2022-10-21 – 2022-10-22 (×2): qty 30, 30d supply, fill #0

## 2022-10-21 MED ORDER — CLOPIDOGREL BISULFATE 75 MG PO TABS
75.0000 mg | ORAL_TABLET | Freq: Every day | ORAL | 0 refills | Status: DC
  Filled 2022-10-21: qty 2, 2d supply, fill #0

## 2022-10-21 MED ORDER — BUTALBITAL-APAP-CAFFEINE 50-325-40 MG PO TABS: 1.0000 | ORAL_TABLET | Freq: Every day | ORAL | Status: DC | PRN

## 2022-10-21 MED ORDER — TOPIRAMATE 25 MG PO TABS
75.0000 mg | ORAL_TABLET | Freq: Every day | ORAL | 0 refills | Status: DC
  Filled 2022-10-21: qty 90, 30d supply, fill #0

## 2022-10-21 NOTE — Progress Notes (Signed)
Occupational Therapy Discharge Summary  Patient Details  Name: Donna Mccarthy MRN: [id number] Date of Birth: 03/08/1976  Date of Discharge from [facility] service:October 21, 2022  Patient has met 24 of 13 long term goals due to improved activity tolerance, improved balance, postural control, ability to compensate for deficits, functional use of  RIGHT upper and RIGHT lower extremity, and improved coordination.  Patient to discharge at overall Independent level.  Patient's care partner is independent to provide the necessary physical assistance at discharge.  Her father will assist her with driving to appointments and errands.   Reasons goals not met: n/a  Recommendation:  Patient will benefit from ongoing skilled OT services in outpatient setting to continue to advance functional skills in the area of iADL and Vocation.  Equipment: No equipment provided  Reasons for discharge: treatment goals met  Patient/family agrees with progress made and goals achieved: Yes  OT Discharge Precautions/Restrictions  fall   ADL ADL Eating: Independent Grooming: Independent Where Assessed-Grooming: Standing at sink Upper Body Bathing: Independent Where Assessed-Upper Body Bathing: Shower Lower Body Bathing: Independent Where Assessed-Lower Body Bathing: Shower Upper Body Dressing: Independent Where Assessed-Upper Body Dressing: Edge of bed Lower Body Dressing: Independent Where Assessed-Lower Body Dressing: Edge of bed Toileting: Independent Where Assessed-Toileting: Glass blower/designer: Programmer, applications Method: Human resources officer: IT consultant: IT consultant Method: Heritage manager: Other (comment) (n/a) ADL Comments: pt stands to shower Vision Baseline Vision/History: 1 Wears glasses Patient Visual Report: No change from baseline (diplopia has resolved) Vision Assessment?: No apparent visual  deficits Eye Alignment: Within Functional Limits Ocular Range of Motion: Within Functional Limits Alignment/Gaze Preference: Within Defined Limits Tracking/Visual Pursuits: Able to track stimulus in all quads without difficulty Saccades: Within functional limits Convergence: Within functional limits Visual Fields: No apparent deficits Diplopia Assessment: Other (comment) (no deficits) Perception  Perception: Within Functional Limits Praxis Praxis: Intact Cognition Cognition Overall Cognitive Status: Within Functional Limits for tasks assessed Person: Oriented Place: Oriented Situation: Oriented Brief Interview for Mental Status (BIMS) Repetition of Three Words (First Attempt): 3 Temporal Orientation: Year: Correct Temporal Orientation: Month: Accurate within 5 days Temporal Orientation: Day: Correct Recall: "Sock": Yes, no cue required Recall: "Blue": Yes, no cue required Recall: "Bed": Yes, no cue required BIMS Summary Score: 15 Sensation Sensation Light Touch: Impaired Detail Peripheral sensation comments: pt reports numbness in toes,  UE WFL Light Touch Impaired Details: Impaired RLE Hot/Cold: Appears Intact Proprioception: Appears Intact Stereognosis: Appears Intact Coordination Gross Motor Movements are Fluid and Coordinated: No Fine Motor Movements are Fluid and Coordinated: Yes Coordination and Movement Description: decreased smoothness and accuracy with RLE only due to tightness in muscles Finger Nose Finger Test: [facility] 9 Hole Peg Test: 26 seconds ( RUE), 24 secs, ( LUE) Motor  Motor Motor - Discharge Observations: minimal sensory loss in R foot/toes Mobility    independent Trunk/Postural Assessment  Cervical Assessment Cervical Assessment: Within Functional Limits Thoracic Assessment Thoracic Assessment: Within Functional Limits Lumbar Assessment Lumbar Assessment: Within Functional Limits Postural Control Postural Control: Within Functional Limits   Balance Static Sitting Balance Static Sitting - Level of Assistance: 7: Independent Dynamic Sitting Balance Dynamic Sitting - Level of Assistance: 7: Independent Static Standing Balance Static Standing - Level of Assistance: 7: Independent Dynamic Standing Balance Dynamic Standing - Level of Assistance: 7: Independent Extremity/Trunk Assessment RUE Assessment RUE Assessment: Within Functional Limits LUE Assessment LUE Assessment: Within Functional Limits   Donna Mccarthy 10/21/2022, 12:45 PM

## 2022-10-21 NOTE — Progress Notes (Signed)
Speech Language Pathology Daily Session Note  Patient Details  Name: Donna Mccarthy MRN: [id number] Date of Birth: Dec 11, 1975  Today's Date: 10/21/2022 SLP Individual Time: 1300-1345 SLP Individual Time Calculation (min): 45 min  Short Term Goals: Week 2: SLP Short Term Goal 1 (Week 2): STG's = LTG's due to ELOS Week 3:    Skilled Therapeutic Interventions: Pt was seen in PM to address cognitive re- training during final ST session. Pt was alert and standing at sink upon SLP arrival. Pt was made "independent" the previous day by her therapy team. Pt expressed excitement and nervousness regarding discharge home. The [facility] Mental Status exam is a cognitive screen utilized to assess the presence of dementia and other cognitive deficits. SLP administered assessment with pt score of 21/ 30 indicative of a mild neurocognitive impairment. Strengths were noted in orientation, language, and visuospatial skills. Some deficits persisted in immediate and delayed recall and mental manipulation. This assessment was previously completed on 2/17 while pt was still in acute care. At that time pt scored a 12/30. SLP reviewed results of evaluation with pt verbalized understanding. In additional minutes of session, SLP reviewed recommendations for WRAP compensatory strategies; including examples of utilization, and cognitive HEP post CVA. Pt verbalized understanding. Pt left seated upright in recliner with call button within reach. DC summary completed.   Pain No pain  Therapy/Group: Individual Therapy  Colin Benton 10/21/2022, 1:42 PM

## 2022-10-21 NOTE — Plan of Care (Signed)
  Problem: RH Balance Goal: LTG Patient will maintain dynamic standing with ADLs (OT) Description: LTG:  Patient will maintain dynamic standing balance with assist during activities of daily living (OT)  Outcome: Completed/Met   Problem: Sit to Stand Goal: LTG:  Patient will perform sit to stand in prep for activites of daily living with assistance level (OT) Description: LTG:  Patient will perform sit to stand in prep for activites of daily living with assistance level (OT) Outcome: Completed/Met   Problem: RH Eating Goal: LTG Patient will perform eating w/assist, cues/equip (OT) Description: LTG: Patient will perform eating with assist, with/without cues using equipment (OT) Outcome: Completed/Met   Problem: RH Grooming Goal: LTG Patient will perform grooming w/assist,cues/equip (OT) Description: LTG: Patient will perform grooming with assist, with/without cues using equipment (OT) Outcome: Completed/Met   Problem: RH Bathing Goal: LTG Patient will bathe all body parts with assist levels (OT) Description: LTG: Patient will bathe all body parts with assist levels (OT) Outcome: Completed/Met   Problem: RH Dressing Goal: LTG Patient will perform upper body dressing (OT) Description: LTG Patient will perform upper body dressing with assist, with/without cues (OT). Outcome: Completed/Met Goal: LTG Patient will perform lower body dressing w/assist (OT) Description: LTG: Patient will perform lower body dressing with assist, with/without cues in positioning using equipment (OT) Outcome: Completed/Met   Problem: RH Toileting Goal: LTG Patient will perform toileting task (3/3 steps) with assistance level (OT) Description: LTG: Patient will perform toileting task (3/3 steps) with assistance level (OT)  Outcome: Completed/Met   Problem: RH Functional Use of Upper Extremity Goal: LTG Patient will use RT/LT upper extremity as a (OT) Description: LTG: Patient will use right/left upper  extremity as a stabilizer/gross assist/diminished/nondominant/dominant level with assist, with/without cues during functional activity (OT) Outcome: Completed/Met   Problem: RH Simple Meal Prep Goal: LTG Patient will perform simple meal prep w/assist (OT) Description: LTG: Patient will perform simple meal prep with assistance, with/without cues (OT). Outcome: Completed/Met   Problem: RH Light Housekeeping Goal: LTG Patient will perform light housekeeping w/assist (OT) Description: LTG: Patient will perform light housekeeping with assistance, with/without cues (OT). Outcome: Completed/Met   Problem: RH Toilet Transfers Goal: LTG Patient will perform toilet transfers w/assist (OT) Description: LTG: Patient will perform toilet transfers with assist, with/without cues using equipment (OT) Outcome: Completed/Met   Problem: RH Tub/Shower Transfers Goal: LTG Patient will perform tub/shower transfers w/assist (OT) Description: LTG: Patient will perform tub/shower transfers with assist, with/without cues using equipment (OT) Outcome: Completed/Met   

## 2022-10-21 NOTE — Plan of Care (Signed)
  Problem: RH Problem Solving Goal: LTG Patient will demonstrate problem solving for (SLP) Description: LTG:  Patient will demonstrate problem solving for basic/complex daily situations with cues  (SLP) Outcome: Completed/Met   Problem: RH Memory Goal: LTG Patient will demonstrate ability for day to day (SLP) Description: LTG:   Patient will demonstrate ability for day to day recall/carryover during cognitive/linguistic activities with assist  (SLP) Outcome: Completed/Met Goal: LTG Patient will use memory compensatory aids to (SLP) Description: LTG:  Patient will use memory compensatory aids to recall biographical/new, daily complex information with cues (SLP) Outcome: Completed/Met   

## 2022-10-21 NOTE — Progress Notes (Signed)
Inpatient Rehabilitation Care Coordinator Discharge Note   Patient Details  Name: Donna Mccarthy MRN: [id number] Date of Birth: 07-25-1976   Discharge location: Home MOD I  Length of Stay: 14 Days  Discharge activity level: MOD I  Home/community participation: Father, cousin and friends  Patient response [id number] Literacy - How often do you need to have someone help you when you read instructions, pamphlets, or other written material from your doctor or pharmacy?: Never  Patient response [id number] Isolation - How often do you feel lonely or isolated from those around you?: Never  Services provided included: MD, RD, PT, OT, SLP, RN, CM, TR, Pharmacy, Neuropsych, SW  Financial Services:  Charity fundraiser Utilized: [place] offered to/list presented to: patient  Follow-up services arranged:  Outpatient, DME    Outpatient Servicies: [facility] PT OT SLP DME : NO DME RECCOMENDED    Patient response to transportation need: Is the patient able to respond to transportation needs?: Yes In the past 12 months, has lack of transportation kept you from medical appointments or from getting medications?: No In the past 12 months, has lack of transportation kept you from meetings, work, or from getting things needed for daily living?: No    Comments (or additional information):  Patient/Family verbalized understanding of follow-up arrangements:  Yes  Individual responsible for coordination of the follow-up plan: self  Confirmed correct DME delivered: Dyanne Iha 10/21/2022    Dyanne Iha

## 2022-10-21 NOTE — Progress Notes (Signed)
PROGRESS NOTE   Subjective/Complaints:  Disucssed return to driving , work activities. Excited and nervous about d/c   ROS: Patient denies fever, rash, sore throat, blurred vision, dizziness, nausea, vomiting, diarrhea, cough, shortness of breath or chest pain, joint or back/neck pain, headache, or mood change.   Objective:   No results found. Recent Labs    10/20/22 0555  WBC 4.0  HGB 10.4*  HCT 32.9*  PLT 194     Recent Labs    10/20/22 0555  NA 136  K 3.5  CL 111  CO2 21*  GLUCOSE 90  BUN 6  CREATININE 0.67  CALCIUM 8.8*      Intake/Output Summary (Last 24 hours) at 10/21/2022 0846 Last data filed at 10/21/2022 0700 Gross per 24 hour  Intake 1062 ml  Output --  Net 1062 ml         Physical Exam: Vital Signs Blood pressure 106/63, pulse 72, temperature 98.4 F (36.9 C), resp. rate 18, height '5\' 5"'$  (1.651 m), weight 82.6 kg, last menstrual period 09/22/2022, SpO2 100 %.    General: No acute distress Mood and affect are appropriate Heart: Regular rate and rhythm no rubs murmurs or extra sounds Lungs: Clear to auscultation, breathing unlabored, no rales or wheezes Abdomen: Positive bowel sounds, soft nontender to palpation, nondistended Extremities: No clubbing, cyanosis, or edema Skin: No evidence of breakdown, no evidence of rash   Musc: Minimal tenderness over the RIght patellar tendon  Neuro: Alert and oriented x 3. Normal insight and awareness. Intact Memory. Normal language and speech. Cranial nerve exam unremarkable  5/5 in BUE and LLE 4- Right HF, KE, ADF (?full effort)  Sensation now able to identify LT below the knee   Eyes without evidence of nystagmus  Tone is normal without evidence of spasticity      Assessment/Plan: 1. Functional deficits which require 3+ hours per day of interdisciplinary therapy in a comprehensive inpatient rehab setting. Physiatrist is providing close  team supervision and 24 hour management of active medical problems listed below. Physiatrist and rehab team continue to assess barriers to discharge/monitor patient progress toward functional and medical goals  Care Tool:  Bathing    Body parts bathed by patient: Right arm, Left arm, Chest, Front perineal area, Abdomen, Buttocks, Left upper leg, Right upper leg, Right lower leg, Face, Left lower leg         Bathing assist Assist Level: Independent with assistive device     Upper Body Dressing/Undressing Upper body dressing   What is the patient wearing?: Pull over shirt, Bra    Upper body assist Assist Level: Independent    Lower Body Dressing/Undressing Lower body dressing      What is the patient wearing?: Underwear/pull up, Pants     Lower body assist Assist for lower body dressing: Independent     Toileting Toileting    Toileting assist Assist for toileting: Independent     Transfers Chair/bed transfer  Transfers assist     Chair/bed transfer assist level: Independent     Locomotion Ambulation   Ambulation assist      Assist level: Supervision/Verbal cueing Assistive device: No Device Max distance: 30  Walk 10 feet activity   Assist     Assist level: Supervision/Verbal cueing Assistive device: No Device   Walk 50 feet activity   Assist    Assist level: Supervision/Verbal cueing Assistive device: No Device    Walk 150 feet activity   Assist Walk 150 feet activity did not occur: Safety/medical concerns  Assist level: Supervision/Verbal cueing Assistive device: No Device    Walk 10 feet on uneven surface  activity   Assist     Assist level: Moderate Assistance - Patient - 50 - 74%     Wheelchair     Assist Is the patient using a wheelchair?: Yes Type of Wheelchair: Manual    Wheelchair assist level: Supervision/Verbal cueing Max wheelchair distance: 150    Wheelchair 50 feet with 2 turns  activity    Assist        Assist Level: Supervision/Verbal cueing   Wheelchair 150 feet activity     Assist      Assist Level: Supervision/Verbal cueing   Blood pressure 106/63, pulse 72, temperature 98.4 F (36.9 C), resp. rate 18, height '5\' 5"'$  (1.651 m), weight 82.6 kg, last menstrual period 09/22/2022, SpO2 100 %.  Medical Problem List and Plan: 1. 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  Antithrombotics: -DVT/anticoagulation: Doppler negative  Pharmaceutical: Lovenox '40mg'$  QD -antiplatelet therapy: DAPT (ASA '81mg'$  QD/Plavix '75mg'$  QD) for 3 weeks followed by aspirin alone (end date: 10/24/22) 3. Pain Management: Tylenol PRN  -10/11/22 Kpad ordered for thigh soreness, cont muscle rub cream -Pain localizing to RIght patellar tendon- cont voltaren gel--improved  4. Mood/Behavior/Sleep: LCSW to follow for evaluation and support.              --trazodone prn for chronic insomnia (melatonin not very effective)             -antipsychotic agents: N/A  -wellbutrin '300mg'$  QD 5. Neuropsych/cognition: This patient is capable of making decisions on her own behalf. 6. Skin/Wound Care:  Routine pressure relief measures 7. Fluids/Electrolytes/Nutrition:  Monitor I/O. Stable labs 10/09/22.    -I personally reviewed the patient's labs today.  8. Hypotension: SBP soft but increasing   Monitor for orthostatic symptoms  -10/20/22 BP still soft but stable, monitor Vitals:   10/17/22 1254 10/17/22 2010 10/18/22 0430 10/18/22 1340  BP: (!) 111/93 112/76 (!) 104/59 114/68   10/18/22 2024 10/19/22 0443 10/19/22 1249 10/19/22 2017  BP: 114/67 113/62 115/70 109/63   10/20/22 0542 10/20/22  1323 10/20/22 2039 10/21/22 0539  BP: 100/62 112/71 (!) 108/57 106/63     9. Migraines: Has been managed with use of Topamax '75mg'$  QHS and Imitrex twice a month on average.   -Cont Fioricet 1 tab q12h PRN, Topamax '75mg'$  QHS 10.Transient hypokalemia: Resolved w/ supplement.  Monitor on weekly labs, next 10/20/22    Latest Ref Rng & Units 10/20/2022    5:55 AM 10/13/2022    7:13 AM 10/09/2022    6:44 AM  BMP  Glucose 70 - 99 mg/dL 90  136  98  BUN 6 - 20 mg/dL '6  9  7   '$ Creatinine 0.44 - 1.00 mg/dL 0.67  0.63  0.64   Sodium 135 - 145 mmol/L 136  136  138   Potassium 3.5 - 5.1 mmol/L 3.5  3.6  3.9   Chloride 98 - 111 mmol/L 111  109  109   CO2 22 - 32 mmol/L '21  22  21   '$ Calcium 8.9 - 10.3 mg/dL 8.8  9.0  9.1     11. Low vitamin D level: Ergocalciferol-21.7 on 08/25/22             --resume supplement 50000IU/week 12. Neutropenia/anemia:3/4  hgb down today. No s/s--follow up cbc as outpt unless there is gross bleeding here on rehab this week    Latest Ref Rng & Units 10/20/2022    5:55 AM 10/13/2022    7:13 AM 10/09/2022    6:44 AM  CBC  WBC 4.0 - 10.5 K/uL 4.0  3.6  3.2   Hemoglobin 12.0 - 15.0 g/dL 10.4  11.4  10.9   Hematocrit 36.0 - 46.0 % 32.9  36.0  33.7   Platelets 150 - 400 K/uL 194  197  153     13. Intermittent constipation: No issues so far. Used colace 2x /wk prn at home.  -LBM 3/4. cont regimen (PRNs: Colace, Miralax, Dulcolax supp, Senokot, Fleet's) 14.  Hyperlipidemia (Cholesterol 129, HDL 40, LDL 83, TG 28 on 10/04/22). Per neurology notes, LDL GOAL <70. Continue Lipitor '20mg'$  QD -3/4 discussed with pt 15. Hypothyroidism: cont Armour thyroid '180mg'$  QD (changed schedule to be DAILY BEFORE BREAKFAST since this is how it should ideally be given) 16. R calf pain: doppler negative, improved after stretch, pt with delayed onset muscle soreness resolving, cont voltaren gel and Kpad       LOS: 13 days A FACE TO FACE EVALUATION WAS PERFORMED  Charlett Blake 10/21/2022,  8:46 AM

## 2022-10-21 NOTE — Progress Notes (Signed)
Speech Language Pathology Discharge Summary  Patient Details  Name: JEILANI PALM MRN: [id number] Date of Birth: 1975-10-20  Date of Discharge from [facility] service:October 21, 2022  Patient has met 4 of 4 long term goals.  Patient to discharge at overall Modified Independent level.   Reasons goals not met: N/A   Clinical Impression/Discharge Summary:   Pt has made functional gains and met 4 out of 4 goals since admission. Pt currently is modified independent in complex problem solving, recall with use of compensatory strategies, and word finding strategies. Pt education is complete and pt will discharge home with intermittent assistance from family and friends. Pt would benefit from follow up SLP services to maximize her cognitive functioning and overall functional independence.    Care Partner:  Caregiver Able to Provide Assistance: Yes  Type of Caregiver Assistance: Physical;Cognitive  Recommendation:  Other (comment) (Intermittent supervision)  Rationale for SLP Follow Up: Maximize cognitive function and independence   Equipment: N/A   Reasons for discharge: Treatment goals met;Discharged from hospital   Patient/Family Agrees with Progress Made and Goals Achieved: Yes    Colin Benton 10/21/2022, 2:38 PM

## 2022-10-21 NOTE — Progress Notes (Signed)
Occupational Therapy Session Note  Patient Details  Name: Donna Mccarthy MRN: [id number] Date of Birth: 02-26-1976  Today's Date: 10/21/2022 OT Individual Time: 1115-1200 and 1115 - 1200 OT Individual Time Calculation (min): 45 min and 45 min    Short Term Goals: Week 1:  OT Short Term Goal 1 (Week 1): Patient will ambulate with device and min A to transition into bathroom. OT Short Term Goal 1 - Progress (Week 1): Met OT Short Term Goal 2 (Week 1): Patient will maintain dynamic standing balance within functional BADL task with CGA OT Short Term Goal 2 - Progress (Week 1): Met OT Short Term Goal 3 (Week 1): Patient will don shoes with set-set-up A. OT Short Term Goal 3 - Progress (Week 1): Met Week 2:  OT Short Term Goal 1 (Week 2): STGs = LTGs  Skilled Therapeutic Interventions/Progress Updates:    Visit 1: Pain:  mild R knee pain  Pt received in room ready for therapy. She completed all ADL tasks independently this am.  Pt ambulated independently to gym.  This session focused on her HEP for R leg stability and strength and balance.  Pt practiced all of her exercises and did them with no cues following picture examples.  Reviewed admission status with patient and her current status and how much she has improved during her rehab stay.  Pt is now quite independent but will need to work on her R leg stability and endurance to be able to return to work in the future.  Pt returned to room with all needs met.    Visit 2: Pain: mild R knee pain Pt received in room working on paper work stating she felt stressed and needed to go for a walk.  Goal of this session is to work on endurance and balance on various terrains.  Pt ambulated to main lobby to walk outside. Pt able to ambulated around patio area on concrete and bricks, down and up 12 stairs using hand rail.  Pt sat on bench to discuss her goals after discharge.  Pt does desire to return to work as soon as possible.   Pt then ambulated inside  and up 16 + 6 steps in a row. Pt did feel somewhat out of breath but rested in standing until she felt well enough to keep walking.  Pt ambulated all the way to front entrance passing by numerous people in hallways without a loss of balance, she then took elevator back to unit and ambulated back to room. Pt did not feel fatigued with ambulation well over 1000 feet at a time.   Pt in room with all needs met.    Therapy Documentation Precautions:  Precautions Precautions: Fall Precaution Comments: fall only Restrictions Weight Bearing Restrictions: No      ADL: ADL Eating: Independent Grooming: Independent Where Assessed-Grooming: Standing at sink Upper Body Bathing: Independent Where Assessed-Upper Body Bathing: Shower Lower Body Bathing: Independent Where Assessed-Lower Body Bathing: Shower Upper Body Dressing: Independent Where Assessed-Upper Body Dressing: Edge of bed Lower Body Dressing: Independent Where Assessed-Lower Body Dressing: Edge of bed Toileting: Independent Where Assessed-Toileting: Glass blower/designer: Programmer, applications Method: Ambulating Tub/Shower Transfer: IT consultant: IT consultant Method: Heritage manager: Other (comment) (n/a) ADL Comments: pt stands to shower     Therapy/Group: Individual Therapy  Arne Schlender 10/21/2022, 12:37 PM

## 2022-10-21 NOTE — Progress Notes (Addendum)
Patient ID: Donna Mccarthy, female   DOB: 1976/01/05, 47 y.o.   MRN: [id number]  OP- Neuro at [facility] referral faxed.  P: [id number]

## 2022-10-21 NOTE — Progress Notes (Signed)
Physical Therapy Discharge Summary  Patient Details  Name: Donna Mccarthy MRN: [id number] Date of Birth: 1976/05/11  Date of Discharge from PT service:October 21, 2022  Today's Date: 10/21/2022 PT Individual Time: 1441-1530 PT Individual Time Calculation (min): 49 min    Patient has met 10 of 10 long term goals due to improved activity tolerance, improved balance, improved postural control, increased strength, ability to compensate for deficits, functional use of  right lower extremity, and improved coordination.  Patient to discharge at an ambulatory level Independent.   Patient's care partner is independent to provide the necessary physical assistance at discharge.  Recommendation:  Patient will benefit from ongoing skilled PT services in outpatient setting to continue to advance safe functional mobility, address ongoing impairments in balance, gait, postural control, motor coordination, endurance, and minimize fall risk.  Equipment: No equipment provided  Reasons for discharge: treatment goals met and discharge from hospital  Patient/family agrees with progress made and goals achieved: Yes  Pt received sitting upright in recliner and agreeable to therapy. Pt's grad day assessment conducted by therapist and detailed below.    Pt ambulated ~242f to therapy gym w/o AD and distant supervision. Pt continues to demonstrated mild motor incoordination throughout RLE during gait and mild truncal ataxia during functional activities. Pt performed the BERG and FGA outcome measures.   BERG: Pt scored: 55/56 which is indicative of pt being independent and demonstrating functional balance, making them a low fall risk. Previously pt scored 15/56. <45 indicates an individual may be at a greater risk of falling.   FGA: Pt scored: 26/30; <22 is indicative of an individual being at a greater risk of falls.   Pt provided education on continuing the work outside of rehab in order to not just maintain  improvements made, but to further progress.   Pt ambulated back to room ~2597fand left w/ all needs met.    PT Discharge Precautions/Restrictions Restrictions Weight Bearing Restrictions: No Vital Signs Therapy Vitals Temp: 98.1 F (36.7 C) Temp Source: Oral Pulse Rate: 88 Resp: 18 BP: 93/73 Patient Position (if appropriate): Sitting Oxygen Therapy SpO2: 100 % O2 Device: Room Air Pain Pain Assessment Pain Scale: 0-10 Pain Score: 0-No pain Pain Interference Pain Interference Pain Effect on Sleep: 1. Rarely or not at all Pain Interference with Therapy Activities: 1. Rarely or not at all Pain Interference with Day-to-Day Activities: 1. Rarely or not at all Vision/Perception  Vision - History Ability to See in Adequate Light: 0 Adequate Vision - Assessment Eye Alignment: Within Functional Limits Ocular Range of Motion: Within Functional Limits Perception Perception: Within Functional Limits  Cognition Overall Cognitive Status: Within Functional Limits for tasks assessed Arousal/Alertness: Awake/alert Orientation Level: Oriented X4 Year: 2024 Month: March Day of Week: Correct Attention: Focused Focused Attention: Appears intact Sustained Attention: Appears intact Selective Attention: Appears intact Memory: Impaired Memory Impairment: Decreased short term memory;Decreased recall of new information;Retrieval deficit Decreased Short Term Memory: Verbal basic Awareness: Appears intact Problem Solving: Appears intact Executive Function: Organizing;Self Monitoring;Sequencing Organizing: Appears intact Self Monitoring: Appears intact Safety/Judgment: Appears intact Sensation Sensation Light Touch: Impaired Detail Peripheral sensation comments: RLE less sensation than LLE Light Touch Impaired Details: Impaired LLE Hot/Cold: Appears Intact Proprioception: Appears Intact Coordination Gross Motor Movements are Fluid and Coordinated: No Coordination and Movement  Description: minor antalgic gait due to RLE muscle pain Motor  Motor Motor: Other (comment) Motor - Discharge Observations: decreased stance time on RLE during gait  Mobility Bed Mobility Bed Mobility: Supine to  Sit Supine to Sit: Independent Transfers Transfers: Sit to Stand;Stand to Sit;Stand Pivot Transfers Sit to Stand: Independent Stand to Sit: Independent Stand Pivot Transfers: Independent Locomotion  Gait Ambulation: Yes Gait Assistance: Independent Gait Distance (Feet): 300 Feet Gait Gait: Yes Gait Pattern: Impaired Gait Pattern: Antalgic High Level Ambulation High Level Ambulation: Backwards walking Backwards Walking: 73f Stairs / Additional Locomotion Stairs: Yes Stairs Assistance: Independent with assistive device Stair Management Technique: One rail Right Number of Stairs: 24 Height of Stairs: 6 Ramp: Independent Curb: Independent with assistive device Wheelchair Mobility Wheelchair Mobility: No  Trunk/Postural Assessment  Cervical Assessment Cervical Assessment: Within Functional Limits Thoracic Assessment Thoracic Assessment: Within Functional Limits Lumbar Assessment Lumbar Assessment: Within Functional Limits Postural Control Postural Control: Within Functional Limits  Balance Balance Balance Assessed: Yes Standardized Balance Assessment Standardized Balance Assessment: Functional Gait Assessment;Berg Balance Test Berg Balance Test Sit to Stand: Able to stand without using hands and stabilize independently Standing Unsupported: Able to stand safely 2 minutes Sitting with Back Unsupported but Feet Supported on Floor or Stool: Able to sit safely and securely 2 minutes Stand to Sit: Sits safely with minimal use of hands Transfers: Able to transfer safely, minor use of hands Standing Unsupported with Eyes Closed: Able to stand 10 seconds safely Standing Ubsupported with Feet Together: Able to place feet together independently and stand 1 minute  safely From Standing, Reach Forward with Outstretched Arm: Can reach confidently >25 cm (10") From Standing Position, Pick up Object from Floor: Able to pick up shoe safely and easily From Standing Position, Turn to Look Behind Over each Shoulder: Looks behind from both sides and weight shifts well Turn 360 Degrees: Able to turn 360 degrees safely in 4 seconds or less Standing Unsupported, Alternately Place Feet on Step/Stool: Able to stand independently and complete 8 steps >20 seconds Standing Unsupported, One Foot in Front: Able to place foot tandem independently and hold 30 seconds Standing on One Leg: Able to lift leg independently and hold > 10 seconds Total Score: 55 Functional Gait  Assessment Gait assessed : Yes Gait Level Surface: Walks 20 ft in less than 5.5 sec, no assistive devices, good speed, no evidence for imbalance, normal gait pattern, deviates no more than 6 in outside of the 12 in walkway width. Change in Gait Speed: Able to smoothly change walking speed without loss of balance or gait deviation. Deviate no more than 6 in outside of the 12 in walkway width. Gait with Horizontal Head Turns: Performs head turns smoothly with slight change in gait velocity (eg, minor disruption to smooth gait path), deviates 6-10 in outside 12 in walkway width, or uses an assistive device. Gait with Vertical Head Turns: Performs task with slight change in gait velocity (eg, minor disruption to smooth gait path), deviates 6 - 10 in outside 12 in walkway width or uses assistive device Gait and Pivot Turn: Pivot turns safely within 3 sec and stops quickly with no loss of balance. Step Over Obstacle: Is able to step over one shoe box (4.5 in total height) without changing gait speed. No evidence of imbalance. Gait with Narrow Base of Support: Ambulates 7-9 steps. Gait with Eyes Closed: Walks 20 ft, no assistive devices, good speed, no evidence of imbalance, normal gait pattern, deviates no more than 6  in outside 12 in walkway width. Ambulates 20 ft in less than 7 sec. Ambulating Backwards: Walks 20 ft, no assistive devices, good speed, no evidence for imbalance, normal gait Steps: Alternating feet, no rail.  Total Score: 26 Extremity Assessment      RLE Assessment RLE Assessment: Exceptions to [facility] General Strength Comments: 4+/5 LLE Assessment LLE Assessment: Within Functional Limits   Aidyn Kellis 10/21/2022, 3:49 PM

## 2022-10-21 NOTE — Plan of Care (Addendum)
I have read and reviewed the attached note and am in agreement with the documentation provided.           This licensed clinician was present and actively directing care throughout the  session at all times.        Judieth Keens PT, DPT, CSRS ----------------------------------- Problem: RH Balance Goal: LTG Patient will maintain dynamic sitting balance (PT) Description: LTG:  Patient will maintain dynamic sitting balance with assistance during mobility activities (PT) Outcome: Completed/Met Goal: LTG Patient will maintain dynamic standing balance (PT) Description: LTG:  Patient will maintain dynamic standing balance with assistance during mobility activities (PT) Outcome: Completed/Met   Problem: RH Bed Mobility Goal: LTG Patient will perform bed mobility with assist (PT) Description: LTG: Patient will perform bed mobility with assistance, with/without cues (PT). Outcome: Completed/Met   Problem: RH Bed to Chair Transfers Goal: LTG Patient will perform bed/chair transfers w/assist (PT) Description: LTG: Patient will perform bed to chair transfers with assistance (PT). Outcome: Completed/Met   Problem: RH Car Transfers Goal: LTG Patient will perform car transfers with assist (PT) Description: LTG: Patient will perform car transfers with assistance (PT). Outcome: Completed/Met   Problem: RH Furniture Transfers Goal: LTG Patient will perform furniture transfers w/assist (OT/PT) Description: LTG: Patient will perform furniture transfers  with assistance (OT/PT). Outcome: Completed/Met   Problem: RH Ambulation Goal: LTG Patient will ambulate in controlled environment (PT) Description: LTG: Patient will ambulate in a controlled environment, # of feet with assistance (PT). Outcome: Completed/Met Goal: LTG Patient will ambulate in home environment (PT) Description: LTG: Patient will ambulate in home environment, # of feet with assistance (PT). Outcome: Completed/Met   Problem: RH  Wheelchair Mobility Goal: LTG Patient will propel w/c in controlled environment (PT) Description: LTG: Patient will propel wheelchair in controlled environment, # of feet with assist (PT) Outcome: Completed/Met   Problem: RH Stairs Goal: LTG Patient will ambulate up and down stairs w/assist (PT) Description: LTG: Patient will ambulate up and down # of stairs with assistance (PT) Outcome: Completed/Met

## 2022-10-21 NOTE — Discharge Instructions (Addendum)
Inpatient Rehab Discharge Instructions  Donna Mccarthy Discharge date and time: No discharge date for patient encounter.   Activities/Precautions/ Functional Status: Activity: no lifting, driving, or strenuous exercise till cleared by MD Diet: cardiac diet Wound Care: none needed   Functional status:  ___ No restrictions     ___ Walk up steps independently ___ 24/7 supervision/assistance   ___ Walk up steps with assistance ___ Intermittent supervision/assistance  _X__ Bathe/dress independently ___ Walk with walker     ___ Bathe/dress with assistance ___ Walk Independently    ___ Shower independently ___ Walk with assistance    ___ Shower with assistance _X__ No alcohol     ___ Return to work/school ________  COMMUNITY REFERRALS UPON DISCHARGE:     Outpatient: PT     OT    ST                Agency: Outpatient at [facility] Phone: [phone]              Appointment Date/Time: Please allow 3-5 days for facility to reach out for scheduling.   Special Instructions:     STROKE/TIA DISCHARGE INSTRUCTIONS SMOKING Cigarette smoking nearly doubles your risk of having a stroke & is the single most alterable risk factor  If you smoke or have smoked in the last 12 months, you are advised to quit smoking for your health. Most of the excess cardiovascular risk related to smoking disappears within a year of stopping. Ask you doctor about anti-smoking medications [place] Quit Line: 1-800-QUIT NOW Free Smoking Cessation Classes [phone]  CHOLESTEROL Know your levels; limit fat & cholesterol in your diet  Lipid Panel     Component Value Date/Time   CHOL 129 10/04/2022 0244   CHOL 160 05/15/2022 0845   TRIG 28 10/04/2022 0244   HDL 40 (L) 10/04/2022 0244   HDL 66 05/15/2022 0845   CHOLHDL 3.2 10/04/2022 0244   VLDL 6 10/04/2022 0244   LDLCALC 83 10/04/2022 0244   LDLCALC 84 05/15/2022 0845     Many patients benefit from treatment even if their cholesterol is at goal. Goal:  Total Cholesterol (CHOL) less than 160 Goal:  Triglycerides (TRIG) less than 150 Goal:  HDL greater than 40 Goal:  LDL (LDLCALC) less than 100   BLOOD PRESSURE American Stroke Association blood pressure target is less that 120/80 mm/Hg  Your discharge blood pressure is:  BP: 106/63 Monitor your blood pressure Limit your salt and alcohol intake Many individuals will require more than one medication for high blood pressure  DIABETES (A1c is a blood sugar average for last 3 months) Goal HGBA1c is under 7% (HBGA1c is blood sugar average for last 3 months)  Diabetes: No known diagnosis of diabetes    Lab Results  Component Value Date   HGBA1C 5.0 10/04/2022    Your HGBA1c can be lowered with medications, healthy diet, and exercise. Check your blood sugar as directed by your physician Call your physician if you experience unexplained or low blood sugars.  PHYSICAL ACTIVITY/REHABILITATION Goal is 30 minutes at least 4 days per week  Activity: Increase activity slowly, and No driving, Therapies: see above Return to work: To be decided after follow up with MD. Activity decreases your risk of heart attack and stroke and makes your heart stronger.  It helps control your weight and blood pressure; helps you relax and can improve your mood. Participate in a regular exercise program. Talk with your doctor about the best form of exercise  for you (dancing, walking, swimming, cycling).  DIET/WEIGHT Goal is to maintain a healthy weight  Your discharge diet is:  Diet Order             Diet Heart Room service appropriate? Yes; Fluid consistency: Thin  Diet effective now                   liquids Your height is:  Height: '5\' 5"'$  (165.1 cm) Your current weight is: Weight: 82.6 kg Your Body Mass Index (BMI) is:  BMI (Calculated): 30.3 Following the type of diet specifically designed for you will help prevent another stroke. Your goal weight is    Your goal Body Mass Index (BMI) is 19-24. Healthy  food habits can help reduce 3 risk factors for stroke:  High cholesterol, hypertension, and excess weight.  RESOURCES Stroke/Support Group:  Call [phone]   STROKE EDUCATION PROVIDED/REVIEWED AND GIVEN TO PATIENT Stroke warning signs and symptoms How to activate emergency medical system (call 911). Medications prescribed at discharge. Need for follow-up after discharge. Personal risk factors for stroke. Pneumonia vaccine given:  Flu vaccine given:  My questions have been answered, the writing is legible, and I understand these instructions.  I will adhere to these goals & educational materials that have been provided to me after my discharge from the hospital.     My questions have been answered and I understand these instructions. I will adhere to these goals and the provided educational materials after my discharge from the hospital.  Patient/Caregiver Signature _______________________________ Date __________  Clinician Signature _______________________________________ Date __________  Please bring this form and your medication list with you to all your follow-up doctor's appointments.

## 2022-10-22 ENCOUNTER — Telehealth: Payer: Self-pay | Admitting: *Deleted

## 2022-10-22 ENCOUNTER — Other Ambulatory Visit (HOSPITAL_COMMUNITY): Payer: Self-pay

## 2022-10-22 NOTE — Telephone Encounter (Signed)
Patient called and was not sure when to schedule hospital follow up. Discharge says call in 1-2 days. When would you like to see her and I will call her back and schedule her.

## 2022-10-22 NOTE — Progress Notes (Signed)
Inpatient Rehabilitation Discharge Medication Review by a Pharmacist  A complete drug regimen review was completed for this patient to identify any potential clinically significant medication issues.  High Risk Drug Classes Is patient taking? Indication by Medication  Antipsychotic No   Anticoagulant No   Antibiotic No   Opioid No   Antiplatelet Yes Aspirin/plavix- cva ppx Plavix end date: 10/24/2022  Hypoglycemics/insulin No   Vasoactive Medication No   Chemotherapy No   Other Yes Protonix- GERD Lipitor- HLD Wellbutrin- MDD Flonase- seasonal rhinitis Claritin- seasonal allergies Thyroid- hypothyroidism Topamax- HA ppx Fioricet- HA Ergocalciferol- vitamin D deficiency     Type of Medication Issue Identified Description of Issue Recommendation(s)  Drug Interaction(s) (clinically significant)     Duplicate Therapy     Allergy     No Medication Administration End Date     Incorrect Dose     Additional Drug Therapy Needed     Significant med changes from prior encounter (inform family/care partners about these prior to discharge).    Other       Clinically significant medication issues were identified that warrant physician communication and completion of prescribed/recommended actions by midnight of the next day:  No   Time spent performing this drug regimen review (minutes):  30   Findley Blankenbaker BS, PharmD, BCPS Clinical Pharmacist 10/22/2022 6:37 AM  Contact: [phone] after 3 PM  "Be curious, not judgmental..." -Jamal Maes

## 2022-10-22 NOTE — Discharge Summary (Signed)
Physician Discharge Summary  Patient ID: Donna Mccarthy MRN: [id number] DOB/AGE: February 13, 1976 47 y.o.  Admit date: 10/08/2022 Discharge date: 10/22/2022  Discharge Diagnoses:  Principal Problem:   Stroke (cerebrum) [facility]) Active Problems:   Panic disorder with agoraphobia and moderate panic attacks   Discharged Condition: {condition:18240}  Significant Diagnostic Studies: VAS [place] LOWER EXTREMITY VENOUS (DVT)  Result Date: 10/13/2022  Lower Venous DVT Study Patient Name:  Donna Mccarthy  Date of Exam:   10/12/2022 Medical Rec #: [id number]           Accession #:    [id number] Date of Birth: [birth year]            Patient Gender: F Patient Age:   47 years Exam Location:  [facility] Procedure:      VAS [place] LOWER EXTREMITY VENOUS (DVT) Referring Phys: [address] --------------------------------------------------------------------------------  Indications: Right posterior knee and calf pain.  Comparison Study: No prior studies. Performing Technologist: Darlin Coco RDMS, RVT  Examination Guidelines: A complete evaluation includes B-mode imaging, spectral Doppler, color Doppler, and power Doppler as needed of all accessible portions of each vessel. Bilateral testing is considered an integral part of a complete examination. Limited examinations for reoccurring indications may be performed as noted. The reflux portion of the exam is performed with the patient in reverse Trendelenburg.  +---------+---------------+---------+-----------+----------+--------------+ RIGHT    CompressibilityPhasicitySpontaneityPropertiesThrombus Aging +---------+---------------+---------+-----------+----------+--------------+ CFV      Full           Yes      Yes                                 +---------+---------------+---------+-----------+----------+--------------+ SFJ      Full                                                         +---------+---------------+---------+-----------+----------+--------------+ FV Prox  Full                                                        +---------+---------------+---------+-----------+----------+--------------+ FV Mid   Full                                                        +---------+---------------+---------+-----------+----------+--------------+ FV DistalFull                                                        +---------+---------------+---------+-----------+----------+--------------+ PFV      Full                                                        +---------+---------------+---------+-----------+----------+--------------+  POP      Full           Yes      Yes                                 +---------+---------------+---------+-----------+----------+--------------+ PTV      Full                                                        +---------+---------------+---------+-----------+----------+--------------+ PERO     Full                                                        +---------+---------------+---------+-----------+----------+--------------+ Gastroc  Full                                                        +---------+---------------+---------+-----------+----------+--------------+   +----+---------------+---------+-----------+----------+--------------+ LEFTCompressibilityPhasicitySpontaneityPropertiesThrombus Aging +----+---------------+---------+-----------+----------+--------------+ CFV Full           Yes      Yes                                 +----+---------------+---------+-----------+----------+--------------+     Summary: RIGHT: - There is no evidence of deep vein thrombosis in the lower extremity.  - No cystic structure found in the popliteal fossa.  LEFT: - No evidence of common femoral vein obstruction.  *See table(s) above for measurements and observations. Electronically signed by Harold Barban MD  on 10/13/2022 at 4:00:13 PM.    Final    VAS [place] TRANSCRANIAL DOPPLER W BUBBLES  Result Date: 10/08/2022  Transcranial Doppler with Bubble Patient Name:  Donna Mccarthy  Date of Exam:   10/08/2022 Medical Rec #: [id number]           Accession #:    [id number] Date of Birth: 1976/06/16            Patient Gender: F Patient Age:   47 years Exam Location:  [facility] Procedure:      VAS [place] TRANSCRANIAL DOPPLER W BUBBLES Referring Phys: Cornelius Moras XU --------------------------------------------------------------------------------  Indications: Stroke. Comparison Study: Echo TEE on 10-07-2022 Performing Technologist: Darlin Coco RDMS, RVT  Examination Guidelines: A complete evaluation includes B-mode imaging, spectral Doppler, color Doppler, and power Doppler as needed of all accessible portions of each vessel. Bilateral testing is considered an integral part of a complete examination. Limited examinations for reoccurring indications may be performed as noted.  Summary:  A vascular evaluation was performed. The left middle cerebral artery was studied. An IV was inserted into the patient's right wrist. Verbal informed consent was obtained.  No HITS at rest or during Valsalva. Negative transcranial Doppler Bubble study with no evidence of right to left intracardiac or intrapulmonary communication.  *See table(s) above for TCD measurements and observations.  Diagnosing physician: Rosalin Hawking MD Electronically signed by Rosalin Hawking MD on 10/08/2022 at 2:44:22 PM.  Final    ECHO TEE  Result Date: 10/07/2022    TRANSESOPHOGEAL ECHO REPORT   Patient Name:   Donna Mccarthy Date of Exam: 10/07/2022 Medical Rec #:  [id number]          Height:       65.0 in Accession #:    [id number]         Weight:       181.4 lb Date of Birth:  [birth year]           BSA:          1.898 m Patient Age:    47 years           BP:           100/83 mmHg Patient Gender: F                  HR:           94 bpm. Exam Location:  Inpatient  Procedure: Transesophageal Echo, Color Doppler, Cardiac Doppler and Saline            Contrast Bubble Study Indications:     stroke  History:         Patient has prior history of Echocardiogram examinations, most                  recent 10/04/2022.  Sonographer:     Johny Chess RDCS Referring Phys:  [id number] Tami Lin [facility] Diagnosing Phys: Gwyndolyn Kaufman MD PROCEDURE: After discussion of the risks and benefits of a TEE, an informed consent was obtained from the patient. The transesophogeal probe was passed without difficulty through the esophogus of the patient. Imaged were obtained with the patient in a left lateral decubitus position. Sedation performed by different physician. The patient was monitored while under deep sedation. Anesthestetic sedation was provided intravenously by Anesthesiology: '701mg'$  of Propofol. The patient developed no complications during the procedure.  IMPRESSIONS  1. Left ventricular ejection fraction, by estimation, is 60 to 65%. The left ventricle has normal function.  2. Right ventricular systolic function is normal. The right ventricular size is normal.  3. No left atrial/left atrial appendage thrombus was detected.  4. The mitral valve is normal in structure. Trivial mitral valve regurgitation.  5. The aortic valve is tricuspid. Aortic valve regurgitation is not visualized. No aortic stenosis is present.  6. The atrial septum is redundant. No interatrial shunting detected by color doppler. Multiple agitated saline contrast bubble studies performed. The majority of bubbles were seen late suggestive of intrapulmonary shunting. There were a small number of bubbles seen early during the study suggestive of a small PFO. Recommend transcranial doppler for further evaluation. FINDINGS  Left Ventricle: Left ventricular ejection fraction, by estimation, is 60 to 65%. The left ventricle has normal function. The left ventricular internal cavity size was normal in size. Right  Ventricle: The right ventricular size is normal. No increase in right ventricular wall thickness. Right ventricular systolic function is normal. Left Atrium: Left atrial size was normal in size. No left atrial/left atrial appendage thrombus was detected. Right Atrium: Right atrial size was normal in size. Pericardium: There is no evidence of pericardial effusion. Mitral Valve: The mitral valve is normal in structure. Trivial mitral valve regurgitation. Tricuspid Valve: The tricuspid valve is normal in structure. Tricuspid valve regurgitation is trivial. No evidence of tricuspid stenosis. Aortic Valve: The aortic valve is tricuspid. Aortic valve regurgitation is not visualized. No aortic stenosis is present. Pulmonic Valve:  The pulmonic valve was normal in structure. Pulmonic valve regurgitation is not visualized. Aorta: The aortic root is normal in size and structure. IAS/Shunts: The atrial septum is redundant. No interatrial shunting detected by color doppler. Multiple agitated saline contrast bubble studies performed. The majority of bubbles were seen late suggestive of intrapulmonary shunting. There were a small number of bubbles seen early during the study suggestive of a small PFO. Recommend transcranial doppler for further evaluation. Agitated saline contrast was given intravenously to evaluate for intracardiac shunting. Gwyndolyn Kaufman MD Electronically signed by Gwyndolyn Kaufman MD Signature Date/Time: 10/07/2022/4:25:23 PM    Final    ECHOCARDIOGRAM COMPLETE  Result Date: 10/05/2022    ECHOCARDIOGRAM REPORT   Patient Name:   ARLENNE SANTELL Date of Exam: 10/04/2022 Medical Rec #:  [id number]          Height:       65.0 in Accession #:    [id number]         Weight:       178.6 lb Date of Birth:  1975-09-09           BSA:          1.885 m Patient Age:    35 years           BP:           135/86 mmHg Patient Gender: F                  HR:           72 bpm. Exam Location:  [facility] Procedure: 2D Echo,  Cardiac Doppler and Color Doppler Indications:    Stroke I63.9  History:        Patient has prior history of Echocardiogram examinations, most                 recent 11/09/2019. Stroke. GERD.  Sonographer:    Alvino Chapel RCS Referring Phys: [id number] [place]  1. Left ventricular ejection fraction, by estimation, is 60 to 65%. The left ventricle has normal function. The left ventricle has no regional wall motion abnormalities. Left ventricular diastolic parameters were normal.  2. Right ventricular systolic function is normal. The right ventricular size is normal.  3. The mitral valve is normal in structure. Trivial mitral valve regurgitation. No evidence of mitral stenosis.  4. The aortic valve is tricuspid. Aortic valve regurgitation is not visualized. No aortic stenosis is present.  5. The inferior vena cava is dilated in size with >50% respiratory variability, suggesting right atrial pressure of 8 mmHg. FINDINGS  Left Ventricle: Left ventricular ejection fraction, by estimation, is 60 to 65%. The left ventricle has normal function. The left ventricle has no regional wall motion abnormalities. The left ventricular internal cavity size was normal in size. There is  no left ventricular hypertrophy. Left ventricular diastolic parameters were normal. Right Ventricle: The right ventricular size is normal. Right ventricular systolic function is normal. Left Atrium: Left atrial size was normal in size. Right Atrium: Right atrial size was normal in size. Pericardium: There is no evidence of pericardial effusion. Mitral Valve: The mitral valve is normal in structure. Trivial mitral valve regurgitation. No evidence of mitral valve stenosis. MV peak gradient, 96.8 mmHg. The mean mitral valve gradient is 64.0 mmHg. Tricuspid Valve: The tricuspid valve is normal in structure. Tricuspid valve regurgitation is trivial. No evidence of tricuspid stenosis. Aortic Valve: The aortic valve is tricuspid. Aortic  valve regurgitation is not visualized.  No aortic stenosis is present. Pulmonic Valve: The pulmonic valve was normal in structure. Pulmonic valve regurgitation is not visualized. No evidence of pulmonic stenosis. Aorta: The aortic root is normal in size and structure. Venous: The inferior vena cava is dilated in size with greater than 50% respiratory variability, suggesting right atrial pressure of 8 mmHg. IAS/Shunts: No atrial level shunt detected by color flow Doppler.  LEFT VENTRICLE PLAX 2D LVIDd:         4.70 cm   Diastology LVIDs:         2.80 cm   LV e' medial:    12.20 cm/s LV PW:         0.80 cm   LV E/e' medial:  8.1 LV IVS:        0.90 cm   LV e' lateral:   17.10 cm/s LVOT diam:     2.00 cm   LV E/e' lateral: 5.8 LV SV:         80 LV SV Index:   43 LVOT Area:     3.14 cm  RIGHT VENTRICLE RV S prime:     18.20 cm/s TAPSE (M-mode): 2.5 cm LEFT ATRIUM             Index        RIGHT ATRIUM           Index LA diam:        3.60 cm 1.91 cm/m   RA Area:     13.00 cm LA Vol (A2C):   58.1 ml 30.82 ml/m  RA Volume:   32.90 ml  17.45 ml/m LA Vol (A4C):   45.6 ml 24.19 ml/m LA Biplane Vol: 53.8 ml 28.54 ml/m  AORTIC VALVE LVOT Vmax:   131.00 cm/s LVOT Vmean:  81.300 cm/s LVOT VTI:    0.256 m  AORTA Ao Root diam: 2.90 cm MITRAL VALVE MV Area (PHT): 2.45 cm    SHUNTS MV Area VTI:   0.53 cm    Systemic VTI:  0.26 m MV Peak grad:  96.8 mmHg   Systemic Diam: 2.00 cm MV Mean grad:  64.0 mmHg MV Vmax:       4.92 m/s MV Vmean:      381.0 cm/s MV Decel Time: 310 msec MV E velocity: 99.00 cm/s MV A velocity: 97.10 cm/s MV E/A ratio:  1.02 Kirk Ruths MD Electronically signed by Kirk Ruths MD Signature Date/Time: 10/05/2022/11:17:03 AM    Final    MR BRAIN WO CONTRAST  Result Date: 10/04/2022 CLINICAL DATA:  Stroke-like symptoms EXAM: MRI HEAD WITHOUT CONTRAST MRA HEAD WITHOUT CONTRAST TECHNIQUE: Multiplanar, multi-echo pulse sequences of the brain and surrounding structures were acquired without intravenous  contrast. Angiographic images of the Circle of Willis were acquired using MRA technique without intravenous contrast. COMPARISON:  05/18/2007 MRI head, correlation is made with CTA head and neck 10/03/2022 FINDINGS: MRI HEAD FINDINGS Brain: No restricted diffusion to suggest acute or subacute infarct. No acute hemorrhage, mass, mass effect, or midline shift. No hydrocephalus or extra-axial collection. No hemosiderin deposition to suggest remote hemorrhage. Cerebral volume is normal for age. Vascular: Normal arterial flow voids. Please see MRA findings below. Skull and upper cervical spine: Normal marrow signal. Sinuses/Orbits: Clear paranasal sinuses. No acute finding in the orbits. Other: The mastoids are well aerated. MRA HEAD FINDINGS Anterior circulation: Both internal carotid arteries are patent to the termini, without significant stenosis. A1 segments patent. Normal anterior communicating artery. Anterior cerebral arteries are patent to their distal  aspects. No M1 stenosis or occlusion. Normal MCA bifurcations. Distal MCA branches perfused and symmetric. Posterior circulation: Vertebral arteries patent to the vertebrobasilar junction without stenosis. Posterior inferior cerebral arteries patent bilaterally. Basilar patent to its distal aspect. Superior cerebellar arteries patent bilaterally. Patent P1 segments. PCAs perfused to their distal aspects without stenosis. The bilateral posterior communicating arteries are patent. Anatomic variants: None significant IMPRESSION: 1. No acute intracranial process. No evidence of acute or subacute infarct. 2. No intracranial large vessel occlusion or significant stenosis. Previously seen narrowing of the MCAs and PCAs on the CTA is not apparent on this exam and may have been secondary to poor vascular opacification. Electronically Signed   By: Merilyn Baba M.D.   On: 10/04/2022 21:55   MR ANGIO HEAD WO CONTRAST  Result Date: 10/04/2022 CLINICAL DATA:  Stroke-like  symptoms EXAM: MRI HEAD WITHOUT CONTRAST MRA HEAD WITHOUT CONTRAST TECHNIQUE: Multiplanar, multi-echo pulse sequences of the brain and surrounding structures were acquired without intravenous contrast. Angiographic images of the Circle of Willis were acquired using MRA technique without intravenous contrast. COMPARISON:  05/18/2007 MRI head, correlation is made with CTA head and neck 10/03/2022 FINDINGS: MRI HEAD FINDINGS Brain: No restricted diffusion to suggest acute or subacute infarct. No acute hemorrhage, mass, mass effect, or midline shift. No hydrocephalus or extra-axial collection. No hemosiderin deposition to suggest remote hemorrhage. Cerebral volume is normal for age. Vascular: Normal arterial flow voids. Please see MRA findings below. Skull and upper cervical spine: Normal marrow signal. Sinuses/Orbits: Clear paranasal sinuses. No acute finding in the orbits. Other: The mastoids are well aerated. MRA HEAD FINDINGS Anterior circulation: Both internal carotid arteries are patent to the termini, without significant stenosis. A1 segments patent. Normal anterior communicating artery. Anterior cerebral arteries are patent to their distal aspects. No M1 stenosis or occlusion. Normal MCA bifurcations. Distal MCA branches perfused and symmetric. Posterior circulation: Vertebral arteries patent to the vertebrobasilar junction without stenosis. Posterior inferior cerebral arteries patent bilaterally. Basilar patent to its distal aspect. Superior cerebellar arteries patent bilaterally. Patent P1 segments. PCAs perfused to their distal aspects without stenosis. The bilateral posterior communicating arteries are patent. Anatomic variants: None significant IMPRESSION: 1. No acute intracranial process. No evidence of acute or subacute infarct. 2. No intracranial large vessel occlusion or significant stenosis. Previously seen narrowing of the MCAs and PCAs on the CTA is not apparent on this exam and may have been  secondary to poor vascular opacification. Electronically Signed   By: Merilyn Baba M.D.   On: 10/04/2022 21:55   CT ANGIO HEAD NECK W WO CM (CODE STROKE)  Result Date: 10/03/2022 CLINICAL DATA:  Stroke-like symptoms EXAM: CT ANGIOGRAPHY HEAD AND NECK TECHNIQUE: Multidetector CT imaging of the head and neck was performed using the standard protocol during bolus administration of intravenous contrast. Multiplanar CT image reconstructions and MIPs were obtained to evaluate the vascular anatomy. Carotid stenosis measurements (when applicable) are obtained utilizing NASCET criteria, using the distal internal carotid diameter as the denominator. RADIATION DOSE REDUCTION: This exam was performed according to the departmental dose-optimization program which includes automated exposure control, adjustment of the mA and/or kV according to patient size and/or use of iterative reconstruction technique. CONTRAST:  85m OMNIPAQUE IOHEXOL 350 MG/ML SOLN COMPARISON:  No prior CTA available, correlation is made with CT head 10/03/2022 FINDINGS: CT HEAD FINDINGS For noncontrast findings, please see same day CT head. CTA NECK FINDINGS Aortic arch: Standard branching. Imaged portion shows no evidence of aneurysm or dissection. No significant stenosis of  the major arch vessel origins. Right carotid system: No evidence of stenosis, dissection, or occlusion. Left carotid system: No evidence of stenosis, dissection, or occlusion. Vertebral arteries: No evidence of stenosis, dissection, or occlusion. Skeleton: No acute osseous abnormality. Other neck: No acute finding. Upper chest: No focal pulmonary opacity or pleural effusion. Review of the MIP images confirms the above findings CTA HEAD FINDINGS Evaluation is somewhat limited by bolus timing. Anterior circulation: Both internal carotid arteries are patent to the termini, without significant stenosis. A1 segments patent. Normal anterior communicating artery. Anterior cerebral  arteries are patent to their distal aspects. The bilateral MCAs appear somewhat narrow, without evidence of downstream dilatation. No focal M1 stenosis or occlusion. MCA branches perfused and symmetric. Posterior circulation: Vertebral arteries patent to the vertebrobasilar junction without stenosis. Posterior inferior cerebellar arteries not well seen. Basilar patent to its distal aspect. Superior cerebellar arteries patent proximally. Patent P1 segments. The bilateral PCAs appear somewhat narrow, without evidence of downstream dilatation, but appear to be perfused proximally. Evaluation of the distal PCAs is limited by venous contamination. Venous sinuses: Patent. Anatomic variants: None significant. Review of the MIP images confirms the above findings IMPRESSION: 1. No large vessel occlusion or significant stenosis in the neck. 2. The bilateral MCAs and PCAs appear somewhat narrow, without evidence of downstream dilatation, but appear to be perfused proximally. This could be secondary to bolus timing, but a vasculitis or reversible cerebral vasoconstriction syndrome (RCVS) is not excluded. Electronically Signed   By: Merilyn Baba M.D.   On: 10/03/2022 20:05   CT HEAD CODE STROKE WO CONTRAST  Result Date: 10/03/2022 CLINICAL DATA:  Code stroke. Neuro deficit, acute, stroke suspected. Left-sided facial droop. Right-sided weakness. EXAM: CT HEAD WITHOUT CONTRAST TECHNIQUE: Contiguous axial images were obtained from the base of the skull through the vertex without intravenous contrast. RADIATION DOSE REDUCTION: This exam was performed according to the departmental dose-optimization program which includes automated exposure control, adjustment of the mA and/or kV according to patient size and/or use of iterative reconstruction technique. COMPARISON:  Brain MRI 05/18/2007.  Head CT 05/18/2007. FINDINGS: Brain: Cerebral volume is normal. There is no acute intracranial hemorrhage. No demarcated cortical infarct. No  extra-axial fluid collection. No evidence of an intracranial mass. No midline shift. Vascular: No hyperdense vessel. Skull: No fracture or aggressive osseous lesion. Sinuses/Orbits: No mass or acute finding within the imaged orbits. No significant paranasal sinus disease at the imaged levels. ASPECTS [facility] Stroke Program Early CT Score) - Ganglionic level infarction (caudate, lentiform nuclei, internal capsule, insula, M1-M3 cortex): 7 - Supraganglionic infarction (M4-M6 cortex): 3 Total score (0-10 with 10 being normal): 10 No evidence of an acute intracranial abnormality. These results were called by telephone at the time of interpretation on 10/03/2022 at 7:03 pm to provider ADAM CURATOLO , who verbally acknowledged these results. IMPRESSION: No evidence of an acute intracranial abnormality. ASPECTS is 10. Electronically Signed   By: Kellie Simmering D.O.   On: 10/03/2022 19:04    Labs:  Basic Metabolic Panel: Recent Labs  Lab 10/20/22 0555  NA 136  K 3.5  CL 111  CO2 21*  GLUCOSE 90  BUN 6  CREATININE 0.67  CALCIUM 8.8*    CBC: Recent Labs  Lab 10/20/22 0555  WBC 4.0  HGB 10.4*  HCT 32.9*  MCV 83.3  PLT 194    CBG: No results for input(s): "GLUCAP" in the last 168 hours.  Brief HPI:   ABAGALE MALECKI is a 47 y.o. female ***  Hospital Course: COURTNEY SHOEN was admitted to rehab 10/08/2022 for inpatient therapies to consist of PT, ST and OT at least three hours five days a week. Past admission physiatrist, therapy team and rehab RN have worked together to provide customized collaborative inpatient rehab.   Blood pressures were monitored on TID basis and   Diabetes has been monitored with ac/hs CBG checks and SSI was use prn for tighter BS control.    Rehab course: During patient's stay in rehab weekly team conferences were held to monitor patient's progress, set goals and discuss barriers to discharge. At admission, patient required  He/She  has had improvement in  activity tolerance, balance, postural control as well as ability to compensate for deficits. He/She has had improvement in functional use RUE/LUE  and RLE/LLE as well as improvement in awareness       Disposition: Home  Diet:  Special Instructions:  Discharge Instructions     Ambulatory referral to Neurology   Complete by: As directed    An appointment is requested in approximately: 3-4 weeks stroke follow up   Ambulatory referral to Physical Medicine Rehab   Complete by: As directed       Allergies as of 10/22/2022       Reactions   Ketorolac Tromethamine Other (See Comments)   "felt like whole body was on fire"   Penicillins Other (See Comments)   Mother said she had a rash   Celebrex [celecoxib] Itching   itching   Eggs Or Egg-derived Products Other (See Comments)        Medication List     STOP taking these medications    enoxaparin 40 MG/0.4ML injection Commonly known as: LOVENOX       TAKE these medications    aspirin 81 MG chewable tablet Chew 1 tablet (81 mg total) by mouth daily.   atorvastatin 20 MG tablet Commonly known as: LIPITOR Take 1 tablet (20 mg total) by mouth daily.   buPROPion 300 MG 24 hr tablet Commonly known as: Wellbutrin XL Take 1 tablet (300 mg total) by mouth daily.   butalbital-acetaminophen-caffeine 50-325-40 MG tablet Commonly known as: FIORICET Take 1 tablet by mouth daily as needed for headache.   clopidogrel 75 MG tablet Commonly known as: PLAVIX Take 1 tablet (75 mg total) by mouth daily.   diclofenac Sodium 1 % Gel Commonly known as: VOLTAREN Apply 2 g topically 4 (four) times daily.   fluticasone 50 MCG/ACT nasal spray Commonly known as: FLONASE Place 2 sprays into both nostrils as needed for allergies.   loratadine 10 MG tablet Commonly known as: CLARITIN Take 10 mg by mouth daily as needed for allergies.   Magnesium 250 MG Tabs Take 250 mg by mouth daily.   Muscle Rub 10-15 % Crea Apply 1  Application topically as needed for muscle pain.   pantoprazole 40 MG tablet Commonly known as: PROTONIX Take 1 tablet (40 mg total) by mouth at bedtime.   thyroid 180 MG tablet Commonly known as: ARMOUR Take 1 tablet (180 mg total) by mouth daily.   topiramate 25 MG tablet Commonly known as: TOPAMAX Take 3 tablets (75 mg total) by mouth at bedtime.   Vitamin D (Ergocalciferol) 1.25 MG (50000 UNIT) Caps capsule Commonly known as: DRISDOL Take 1 capsule (50,000 Units total) by mouth every 7 (seven) days.        Follow-up Information     Rita Ohara, MD Follow up.   Specialty: Family Medicine Why: Call in 1-2  days for post hospital follow up Contact information: [place] [address] [phone]         [facility] Follow up.   Why: office will call you with follow up appointment Contact information: [address]     [address] [place] [phone] [place] Follow up.   Why: office will call you with follow up appointment Contact information: [place] [phone]        Jennye Boroughs, MD Follow up on 11/20/2022.   Specialty: Physical Medicine and Rehabilitation Why: follow up appointment Contact information: [address] [place] [address] [phone]                 Signed: Bary Leriche 10/22/2022, 10:36 AM

## 2022-10-22 NOTE — Telephone Encounter (Signed)
F/u Wednesday next week

## 2022-10-23 ENCOUNTER — Telehealth: Payer: Self-pay

## 2022-10-23 NOTE — Transitions of Care (Post Inpatient/ED Visit) (Signed)
   10/23/2022  Name: Donna Mccarthy MRN: [id number] DOB: 15-Jul-1976  Today's TOC FU Call Status: Today's TOC FU Call Status:: Unsuccessul Call (1st Attempt) Unsuccessful Call (1st Attempt) Date: 10/23/22  Attempted to reach the patient regarding the most recent Inpatient/ED visit.  Follow Up Plan: Additional outreach attempts will be made to reach the patient to complete the Transitions of Care (Post Inpatient/ED visit) call.   [place] LPN [place] Advisor Direct Dial [phone]

## 2022-10-24 NOTE — Progress Notes (Signed)
Recreational Therapy Discharge Summary Patient Details  Name: JAZARIYAH GRIBBINS MRN: [id number] Date of Birth: [birth year] Today's Date: 10/24/2022   Comments on progress toward goals: Pt made great progress during LOS and discharged home at Mod I level.  TR sessions/pt education focused on activity analysis, identification of leisure modifications, stress management/coping, and simple meal prep/kitchen tasks.  Pt is motivated to return to previously enjoyed activities as able.   Reasons for discharge: discharge from hospital  Follow-up: Outpatient  Patient/family agrees with progress made and goals achieved: Yes  Hailee Hollick 10/24/2022, 8:39 AM

## 2022-10-27 NOTE — Transitions of Care (Post Inpatient/ED Visit) (Unsigned)
   10/27/2022  Name: Donna Mccarthy MRN: [id number] DOB: 08/05/1976  Today's TOC FU Call Status: Today's TOC FU Call Status:: Unsuccessful Call (2nd Attempt) Unsuccessful Call (1st Attempt) Date: 10/23/22 Unsuccessful Call (2nd Attempt) Date: 10/27/22  Attempted to reach the patient regarding the most recent Inpatient/ED visit.  Follow Up Plan: Additional outreach attempts will be made to reach the patient to complete the Transitions of Care (Post Inpatient/ED visit) call.   [place] LPN [place] Advisor Direct Dial [phone]

## 2022-10-28 NOTE — Transitions of Care (Post Inpatient/ED Visit) (Signed)
   10/28/2022  Name: Donna Mccarthy MRN: [id number] DOB: [birth year]  Today's TOC FU Call Status: Today's TOC FU Call Status:: Unsuccessful Call (3rd Attempt) Unsuccessful Call (1st Attempt) Date: 10/23/22 Unsuccessful Call (2nd Attempt) Date: 10/27/22 Unsuccessful Call (3rd Attempt) Date: 10/28/22  Attempted to reach the patient regarding the most recent Inpatient/ED visit.  Follow Up Plan: No further outreach attempts will be made at this time. We have been unable to contact the patient.  Pt has fu appt with PCP 10-29-22   [place] LPN [place] Direct Dial [phone]

## 2022-10-28 NOTE — Progress Notes (Signed)
No chief complaint on file.  Patient presents for hospital follow-up. She was admitted 2/16-2/21 with slurred speech, blurred vision, diploplia, left facial droop. She was treated with TNK.  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  No abnormal findings on CT, MRI/MRA. Questionable MCA and PCA narrowing noted on CTA of head/neck. Echo--normal, with EF 60-65%, negative bubble study, no shunt. She has h/o HTN, migraines (on topamax), anxiety/depression, and had recent COVID infection. ??+opiate on urine drug screen on admission  LDL was 83, and '20mg'$  of atorvastatin was started. She was treated with ASA '81mg'$  daily along with plavix '75mg'$  x 3 weeks, then to stop Plavix and continue ASA alone.  She was in rehab 2/21-3/6. She was limited by double vision, right-sided weakness with decrease in motor control as well as sensory deficits, and received  PT, ST and OT. She completed the DAPT, and is currently now just on ASA '81mg'$  daily. While in rehab, f/u CBC did show slight drop in H&H without any signs of bleeding. They recommended repeat CBC in 1 to 2 weeks to monitor for H&H stability.  Component Ref Range & Units 8 d ago (10/20/22) 2 wk ago (10/13/22) 2 wk ago (10/09/22) 2 wk ago (10/08/22) 3 wk ago (10/07/22) 3 wk ago (10/06/22) 3 wk ago (10/05/22)  WBC 4.0 - 10.5 K/uL 4.0  3.6 Low   3.2 Low   3.8 Low   3.7 Low   3.6 Low   4.1   RBC 3.87 - 5.11 MIL/uL 3.95  4.35  4.09  4.38  4.24  4.17  4.26   Hemoglobin 12.0 - 15.0 g/dL 10.4 Low   11.4 Low   10.9 Low   11.4 Low   11.1 Low   11.0 Low   11.1 Low    HCT 36.0 - 46.0 % 32.9 Low   36.0  33.7 Low   36.6  35.3 Low   34.8 Low   35.6 Low    MCV 80.0 - 100.0 fL 83.3  82.8  82.4  83.6  83.3  83.5  83.6   MCH 26.0 - 34.0 pg 26.3  26.2  26.7  26.0  26.2  26.4  26.1   MCHC 30.0 - 36.0 g/dL 31.6  31.7  32.3  31.1  31.4  31.6  31.2   RDW 11.5 - 15.5 % 13.8  13.6  13.9  13.9  13.9  14.1  14.2   Platelets 150 - 400 K/uL 194  197  153  171   166  160  171   nRBC 0.0 - 0.2 % 0.0  0.0 CM  0.0  0.0 CM  0.0 CM  0.0 CM  0.0 CM      In rehab she reported pain right calf, had an [place] which was negative for DVT.  Pain resolved with PT. BP's were controlled.  At one point had low K+, was supplemented. She was continued on her prescription ergocalciferol (level was low at 21.7 in 08/2022), and topamax for headaches, and wellbutrin for moods. She will continue to receive outpatient PT, OT and ST at [facility] neuro rehab at [facility] after discharge.    PMH, PSH, SH reviewed  ROS:   PHYSICAL EXAM:  LMP 09/22/2022 (Approximate)   Wt Readings from Last 3 Encounters:  10/08/22 182 lb 1.6 oz (82.6 kg)  10/07/22 181 lb 6.4 oz (82.3 kg)  09/29/22 183 lb (83 kg)     ASSESSMENT/PLAN:  CBC  Will need  lipids and LFT's repeated in 2-3 mos. Remind to take OTC D3 upon completion of Rx  Unable to drive until cleared Has neuro f/u 4/3.

## 2022-10-29 ENCOUNTER — Ambulatory Visit (INDEPENDENT_AMBULATORY_CARE_PROVIDER_SITE_OTHER): Payer: 59 | Admitting: Family Medicine

## 2022-10-29 ENCOUNTER — Encounter: Payer: Self-pay | Admitting: Family Medicine

## 2022-10-29 VITALS — BP 110/80 | HR 72 | Ht 65.0 in | Wt 184.6 lb

## 2022-10-29 DIAGNOSIS — F419 Anxiety disorder, unspecified: Secondary | ICD-10-CM | POA: Diagnosis not present

## 2022-10-29 DIAGNOSIS — Z8673 Personal history of transient ischemic attack (TIA), and cerebral infarction without residual deficits: Secondary | ICD-10-CM

## 2022-10-29 DIAGNOSIS — G43909 Migraine, unspecified, not intractable, without status migrainosus: Secondary | ICD-10-CM | POA: Diagnosis not present

## 2022-10-29 DIAGNOSIS — Z5181 Encounter for therapeutic drug level monitoring: Secondary | ICD-10-CM | POA: Diagnosis not present

## 2022-10-29 DIAGNOSIS — D649 Anemia, unspecified: Secondary | ICD-10-CM | POA: Diagnosis not present

## 2022-10-29 LAB — CBC WITH DIFFERENTIAL/PLATELET
Basophils Absolute: 0 10*3/uL (ref 0.0–0.2)
Basos: 0 %
EOS (ABSOLUTE): 0.1 10*3/uL (ref 0.0–0.4)
Eos: 2 %
Hematocrit: 36.1 % (ref 34.0–46.6)
Hemoglobin: 11.9 g/dL (ref 11.1–15.9)
Lymphocytes Absolute: 1.9 10*3/uL (ref 0.7–3.1)
Lymphs: 45 %
MCH: 26.6 pg (ref 26.6–33.0)
MCHC: 33 g/dL (ref 31.5–35.7)
MCV: 81 fL (ref 79–97)
Monocytes Absolute: 0.3 10*3/uL (ref 0.1–0.9)
Monocytes: 8 %
Neutrophils Absolute: 1.9 10*3/uL (ref 1.4–7.0)
Neutrophils: 45 %
Platelets: 252 10*3/uL (ref 150–450)
RBC: 4.48 x10E6/uL (ref 3.77–5.28)
RDW: 14.8 % (ref 11.7–15.4)
WBC: 4.2 10*3/uL (ref 3.4–10.8)

## 2022-10-29 NOTE — Patient Instructions (Signed)
Remember to start a daily D3 upon completion of your prescription course.  If the fioricet isn't effective for a migraine (if you get one prior to seeing the neurologist), let me know. Nurtec or Roselyn Meier might also be a good option.  I'm so glad to see you doing so much better!

## 2022-10-29 NOTE — Therapy (Signed)
OUTPATIENT PHYSICAL THERAPY NEURO EVALUATION   Patient Name: Donna Mccarthy MRN: [id number] DOB:10-Jun-1976, 47 y.o., female Today's Date: 10/30/2022   PCP: Rita Ohara, MD  REFERRING PROVIDER: Cathlyn Parsons, PA-C  END OF SESSION:  PT End of Session - 10/30/22 1618     Visit Number 1    Number of Visits 13    Date for PT Re-Evaluation 12/11/22    Authorization Type [employer]    Authorization - Number of Visits 25   before medical review   PT Start Time 1534    PT Stop Time 1614    PT Time Calculation (min) 40 min    Equipment Utilized During Treatment Gait belt    Activity Tolerance Patient tolerated treatment well    Behavior During Therapy WFL for tasks assessed/performed             Past Medical History:  Diagnosis Date   Allergy    Anemia    Anxiety    Arthritis    arthritis knees   Blood transfusion without reported diagnosis    GERD (gastroesophageal reflux disease)    Hypothyroidism    Neuromuscular disorder ([place])    Obesity (BMI 30-39.9) 01/05/2019   Past Surgical History:  Procedure Laterality Date   BIOPSY  04/03/2020   Procedure: BIOPSY;  Surgeon: Harvel Quale, MD;  [facility];  Service: Gastroenterology;;  antral, body, distal esophagus, mid esophagus   BRACHIOPLASTY Bilateral 07/02/2020   Procedure: BILATERAL BRACHIOPLASTY WITH LIPOSUCTION;  Surgeon: [name], MD;  Location: [place] [facility];  Service: Plastics;  Laterality: Bilateral;   DILATION AND CURETTAGE OF UTERUS  2022   ESOPHAGOGASTRODUODENOSCOPY (EGD) WITH PROPOFOL N/A 04/03/2020   Procedure: ESOPHAGOGASTRODUODENOSCOPY (EGD) WITH PROPOFOL;  Surgeon: Harvel Quale, MD;  [facility];  Service: Gastroenterology;  Laterality: N/A;  11:15   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   LIPOSUCTION Bilateral 07/02/2020   Procedure: LIPOSUCTION;  Surgeon: [name], MD;  Location: [place];   Service: Plastics;  Laterality: Bilateral;   PANNICULECTOMY N/A 08/04/2019   Procedure: PANNICULECTOMY;  Surgeon: [name], MD;  Location: [place];  Service: Plastics;  Laterality: N/A;  2 hours only, please   TEE WITHOUT CARDIOVERSION N/A 10/07/2022   Procedure: TRANSESOPHAGEAL ECHOCARDIOGRAM (TEE);  Surgeon: Freada Bergeron, MD;  Location: [facility] END[facility];  Service: Cardiovascular;  Laterality: N/A;   Patient Active Problem List   Diagnosis Date Noted   Panic disorder with agoraphobia and moderate panic attacks 10/14/2022   Stroke (cerebrum) ([place]) 10/08/2022   CVA (cerebral vascular accident) ([place]) 10/03/2022   Stroke determined by clinical assessment ([place]) 10/03/2022   Dysphagia 03/29/2020   Nausea with vomiting 03/29/2020   Vitamin D deficiency 02/02/2020   S/P panniculectomy 01/02/2020   Family history of diabetes mellitus in grandmother 04/20/2019   Obesity (BMI 30.0-34.9) 04/20/2019   Obesity (BMI 30-39.9) 01/05/2019   Hypothyroidism 09/29/2017   Migraine 09/29/2017   Knee pain, bilateral 09/29/2017   Generalized anxiety disorder 09/29/2017   Gastroesophageal reflux disease 09/29/2017    ONSET DATE: 10/03/22  REFERRING DIAG: I63.9 (ICD-10-CM) - Cerebral infarction, unspecified  THERAPY DIAG:  Muscle weakness (generalized)  Other abnormalities of gait and mobility  Right knee pain, unspecified chronicity  Rationale for Evaluation and Treatment: Rehabilitation  SUBJECTIVE:  SUBJECTIVE STATEMENT: Patient notes that she is doing well since coming home from the hospital. Denies issues with balance but reports a slight limp, N/T in the last 3 toes of the R side. Reports LE fatigue at the end of the day. Has been working on some of the exercises given at the  hospital. Reports hx R knee ache but it has flared up recently since her stroke however it is improving. Notes dizziness early on; no longer noticing it however does report some double vision with reading.   Pt accompanied by: self  PERTINENT HISTORY: Anemia, anxiety, GERD, neuromuscular disorder?, migraines Per chart- "10/03/22 and had sudden onset L facial droop, dysarthria, blurred vision."  "CN VI incomplete palsy. She had dysconjugate gaze with diplopia that Dr. Erlinda Hong felt was more consistent with possible brainstem infarct secondary to small vessel disease and not seen on MRI."  PAIN:  Are you having pain? No  PRECAUTIONS: Fall  WEIGHT BEARING RESTRICTIONS: No  FALLS: Has patient fallen in last 6 months? 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 No acute intracranial process. No evidence of acute or subacute infarct.No intracranial large vessel occlusion or significant stenosis.  COGNITION: Overall cognitive status: Within functional limits for tasks assessed and reports c/o occasional word finding issues   SENSATION: Report of numbness in the R 3 lateral toes   COORDINATION: Alternating pronation/supination: intact B Alternating toe tap: intact B Finger to nose: intact B Heel to shin: intact B   PALPATION: TTP over R lateral jt line  MUSCLE TONE: LLE: Mild in L knee and ankle    POSTURE: rounded shoulders  LOWER EXTREMITY ROM:     Active  Right Eval Left Eval  Hip flexion    Hip extension    Hip abduction    Hip adduction    Hip internal rotation    Hip external rotation     Knee flexion    Knee extension    Ankle dorsiflexion 19 3  Ankle plantarflexion    Ankle inversion    Ankle eversion     (Blank rows = not tested)  LOWER EXTREMITY MMT:    MMT (in sitting) Right Eval Left Eval  Hip flexion 4- 4  Hip extension    Hip abduction 4- 4  Hip adduction 4 4  Hip internal rotation    Hip external rotation    Knee flexion 4+ 4+  Knee extension 4 4  Ankle dorsiflexion 4 4+  Ankle plantarflexion 4- 4+  Ankle inversion    Ankle eversion    (Blank rows = not tested)    VESTIBULAR ASSESSMENT   GENERAL OBSERVATION: pt wears bifocals     OCULOMOTOR EXAM:   Ocular Alignment: normal   Ocular ROM:  limited L inferior ROM   Spontaneous Nystagmus: absent   Gaze-Induced Nystagmus: absent   Smooth Pursuits:  intact; c/o strain in the R eye   Saccades:  slight overshooting to R and inferior   Convergence/Divergence: ~5 inches, when trialed again limited to ~1 ft away     VESTIBULAR - OCULAR REFLEX:    Slow VOR: Normal c/o dizziness  horizontal and vertical   VOR Cancellation: Normal   GAIT: Gait pattern: antalgia on R LE with decreased L step length Assistive device utilized: None Level of assistance: Modified independence  FUNCTIONAL TESTS:   5xSTS: 16.76 sec without Ues     [facility] PT Assessment - 10/30/22 0001       Functional Gait  Assessment   Gait Level Surface Walks 20 ft in less than 7 sec but greater than 5.5 sec, uses assistive device, slower speed, mild gait deviations, or deviates 6-10 in outside of the 12 in walkway width.    Change in Gait Speed Makes only minor adjustments to walking speed, or accomplishes a change in speed with significant gait deviations, deviates 10-15 in outside the 12 in walkway width, or changes speed but loses balance but is able to recover and continue walking.    Gait with Horizontal Head Turns Performs head turns smoothly with slight change in gait velocity (eg, minor disruption to smooth gait path), deviates  6-10 in outside 12 in walkway width, or uses an assistive device.    Gait with Vertical Head Turns Performs task with slight change in gait velocity (eg, minor disruption to smooth gait path), deviates 6 - 10 in outside 12 in walkway width or uses assistive device    Gait and Pivot Turn Pivot turns safely within 3 sec and stops quickly with no loss of balance.    Step Over Obstacle Is able to step over 2 stacked shoe boxes taped together (9 in total height) without changing gait speed. No evidence of imbalance.    Gait with Narrow Base of Support Is able to ambulate for 10 steps heel to toe with no staggering.    Gait with Eyes Closed Walks 20 ft, uses assistive device, slower speed, mild gait deviations, deviates 6-10 in outside 12 in walkway width. Ambulates 20 ft in less than 9 sec but greater than 7 sec.    Ambulating Backwards Walks 20 ft, uses assistive device, slower speed, mild gait deviations, deviates 6-10 in outside 12 in walkway width.    Steps Alternating feet, no rail.    Total Score 23              PATIENT SURVEYS:  FOTO NT- pt had to leave  TODAY'S TREATMENT:                                                                                                                              DATE: 10/30/22    PATIENT EDUCATION: Education details: prognosis, POC, exam findings Person educated: Patient Education method: Explanation Education comprehension: verbalized understanding  HOME EXERCISE PROGRAM: Not yet initiated     GOALS: Goals reviewed with patient? Yes  SHORT TERM GOALS: Target date: 11/20/2022  Patient to be independent with initial HEP. Baseline: HEP initiated Goal status: INITIAL    LONG TERM GOALS: Target date: 12/11/2022  Patient to be independent with advanced HEP. Baseline: Not  yet initiated  Goal status: INITIAL  Patient to demonstrate B LE strength >/=4+/5.  Baseline: See above Goal status: INITIAL  Patient to demonstrate 10 deg L ankle  dorsiflexion AROM WFL. Baseline: 3 deg Goal status: INITIAL  Patient to score at least 27/30 on FGA in order to decrease risk of falls.  Baseline: 23 Goal status: INITIAL  Patient to demonstrate symmetrical step length with ambulation without device.  Baseline: antalgia on R LE Goal status: INITIAL  Patient to demonstrate 5xSTS test in <12 sec in order to decrease risk of falls.  Baseline: 16 sec Goal status: INITIAL     ASSESSMENT:  CLINICAL IMPRESSION:  Patient is a 47 y/o F presenting to [facility] with c/o weakness, R foot numbness, and diplopia s/p sudden onset L facial droop, dysarthria, blurred vision on 10/03/22. Imaging negative, however Neurology suspects brainstem CVA vs functional neurologic deficit. Patient participated in [place] 10/09/22-10/22/22 and D/C'd home. Patient today presenting with TTP over R lateral jt line, slightly increased tone in the L LE, limited L ankle AROM, B LE weakness, limited convergence, dizziness with VOR, possible decreased L eye inferior ROM, increased time required for transfers, and gait deviations. Prior to current episode, patient was independent. Would benefit from skilled PT services 1-2 x/week for 6 weeks to address aforementioned impairments in order to optimize level of function.    OBJECTIVE IMPAIRMENTS: Abnormal gait, decreased activity tolerance, decreased balance, difficulty walking, decreased ROM, decreased strength, dizziness, impaired flexibility, improper body mechanics, and pain.   ACTIVITY LIMITATIONS: carrying, lifting, bending, standing, squatting, stairs, transfers, bathing, toileting, dressing, hygiene/grooming, and locomotion level  PARTICIPATION LIMITATIONS: meal prep, cleaning, laundry, driving, shopping, community activity, occupation, and church  PERSONAL FACTORS: Age, Past/current experiences, Time since onset of injury/illness/exacerbation, Transportation, and 3+ comorbidities: Anemia, anxiety, GERD, neuromuscular disorder?,  migraines  are also affecting patient's functional outcome.   REHAB POTENTIAL: Good  CLINICAL DECISION MAKING: Evolving/moderate complexity  EVALUATION COMPLEXITY: Moderate  PLAN:  PT FREQUENCY: 1-2x/week  PT DURATION: 6 weeks  PLANNED INTERVENTIONS: Therapeutic exercises, Therapeutic activity, Neuromuscular re-education, Balance training, Gait training, Patient/Family education, Self Care, Joint mobilization, Stair training, Vestibular training, Canalith repositioning, Aquatic Therapy, Dry Needling, Electrical stimulation, Cryotherapy, Moist heat, Taping, Manual therapy, and Re-evaluation  PLAN FOR NEXT SESSION: initiate HEP for functional head movements with balance, convergence, LE strengthening, L calf stretch   Janene Harvey, PT, DPT 10/30/22 4:30 PM  [facility] at [facility] [address] [place], [place] [address] Phone # [phone] Fax # [phone]

## 2022-10-30 ENCOUNTER — Ambulatory Visit: Payer: 59 | Attending: Physician Assistant | Admitting: Occupational Therapy

## 2022-10-30 ENCOUNTER — Other Ambulatory Visit (HOSPITAL_BASED_OUTPATIENT_CLINIC_OR_DEPARTMENT_OTHER): Payer: Self-pay

## 2022-10-30 ENCOUNTER — Ambulatory Visit: Payer: 59 | Admitting: Physical Therapy

## 2022-10-30 ENCOUNTER — Encounter: Payer: Self-pay | Admitting: Physical Therapy

## 2022-10-30 ENCOUNTER — Other Ambulatory Visit: Payer: Self-pay

## 2022-10-30 ENCOUNTER — Other Ambulatory Visit: Payer: Self-pay | Admitting: Family Medicine

## 2022-10-30 DIAGNOSIS — M25561 Pain in right knee: Secondary | ICD-10-CM | POA: Insufficient documentation

## 2022-10-30 DIAGNOSIS — M6281 Muscle weakness (generalized): Secondary | ICD-10-CM | POA: Diagnosis not present

## 2022-10-30 DIAGNOSIS — R41840 Attention and concentration deficit: Secondary | ICD-10-CM | POA: Insufficient documentation

## 2022-10-30 DIAGNOSIS — I69351 Hemiplegia and hemiparesis following cerebral infarction affecting right dominant side: Secondary | ICD-10-CM | POA: Insufficient documentation

## 2022-10-30 DIAGNOSIS — R2681 Unsteadiness on feet: Secondary | ICD-10-CM | POA: Diagnosis not present

## 2022-10-30 DIAGNOSIS — R2689 Other abnormalities of gait and mobility: Secondary | ICD-10-CM | POA: Insufficient documentation

## 2022-10-30 NOTE — Therapy (Signed)
OUTPATIENT OCCUPATIONAL THERAPY NEURO EVALUATION  Patient Name: Donna Mccarthy MRN: [id number] DOB:[birth year], 47 y.o., female Today's Date: 10/30/2022  PCP: Rita Ohara, MD REFERRING PROVIDER: Cathlyn Parsons, PA-C  END OF SESSION:  OT End of Session - 10/30/22 1601     Visit Number 1    Number of Visits 9    Date for OT Re-Evaluation 12/11/22    Authorization Type [place] AETNA FOCUS    OT Start Time 1448    OT Stop Time [id number]    OT Time Calculation (min) 44 min             Past Medical History:  Diagnosis Date   Allergy    Anemia    Anxiety    Arthritis    arthritis knees   Blood transfusion without reported diagnosis    GERD (gastroesophageal reflux disease)    Hypothyroidism    Neuromuscular disorder ([place])    Obesity (BMI 30-39.9) 01/05/2019   Past Surgical History:  Procedure Laterality Date   BIOPSY  04/03/2020   Procedure: BIOPSY;  Surgeon: Harvel Quale, MD;  [facility];  Service: Gastroenterology;;  antral, body, distal esophagus, mid esophagus   BRACHIOPLASTY Bilateral 07/02/2020   Procedure: BILATERAL BRACHIOPLASTY WITH LIPOSUCTION;  Surgeon: [name], MD;  Location: [place];  Service: Plastics;  Laterality: Bilateral;   DILATION AND CURETTAGE OF UTERUS  2022   ESOPHAGOGASTRODUODENOSCOPY (EGD) WITH PROPOFOL N/A 04/03/2020   Procedure: ESOPHAGOGASTRODUODENOSCOPY (EGD) WITH PROPOFOL;  Surgeon: Harvel Quale, MD;  [facility];  Service: Gastroenterology;  Laterality: N/A;  11:15   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   LIPOSUCTION Bilateral 07/02/2020   Procedure: LIPOSUCTION;  Surgeon: [name], MD;  Location: [place];  Service: Plastics;  Laterality: Bilateral;   PANNICULECTOMY N/A 08/04/2019   Procedure: PANNICULECTOMY;  Surgeon: [name], MD;  Location: [place];  Service: Plastics;  Laterality: N/A;  2 hours only,  please   TEE WITHOUT CARDIOVERSION N/A 10/07/2022   Procedure: TRANSESOPHAGEAL ECHOCARDIOGRAM (TEE);  Surgeon: Freada Bergeron, MD;  Location: [facility];  Service: Cardiovascular;  Laterality: N/A;   Patient Active Problem List   Diagnosis Date Noted   Panic disorder with agoraphobia and moderate panic attacks 10/14/2022   Stroke (cerebrum) ([place]) 10/08/2022   CVA (cerebral vascular accident) ([place]) 10/03/2022   Stroke determined by clinical assessment ([place]) 10/03/2022   Dysphagia 03/29/2020   Nausea with vomiting 03/29/2020   Vitamin D deficiency 02/02/2020   S/P panniculectomy 01/02/2020   Family history of diabetes mellitus in grandmother 04/20/2019   Obesity (BMI 30.0-34.9) 04/20/2019   Obesity (BMI 30-39.9) 01/05/2019   Hypothyroidism 09/29/2017   Migraine 09/29/2017   Knee pain, bilateral 09/29/2017   Generalized anxiety disorder 09/29/2017   Gastroesophageal reflux disease 09/29/2017    ONSET DATE: 10/03/22  REFERRING DIAG: I63.9 (ICD-10-CM) - Cerebral infarction, unspecified  THERAPY DIAG:  Hemiplegia and hemiparesis following cerebral infarction affecting right dominant side (HCC)  Muscle weakness (generalized)  Other abnormalities of gait and mobility  Unsteadiness on feet  Attention and concentration deficit  Rationale for Evaluation and Treatment: Rehabilitation  SUBJECTIVE:   SUBJECTIVE STATEMENT: Pt reports having a stroke on 10/03/22.  Pt then drove herself to [place] (as she was driving when onset of stroke).  Pt reports having blurry and double vision, and initially using a RW for mobility.  Pt reports progressing to no longer using  AD and vision nearly back to baseline.  Pt reports still having weakness in R side, pain in R knee, and numbness in some toes on R side.  Pt reports mild weakness in RUE since returning home and not as engaged in therapy.  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 Pt accompanied by: self  PERTINENT HISTORY: 47 y.o. female who was driving [id number] and had sudden onset L facial droop, dysarthria, blurred vision. Drove herself to ED.MRI/MRA brain done and negative for stroke, stenosis or acute process. 2D echo showed EF 60-65% with no wall abnormalities. She had dysconjugate gaze with diplopia that Dr. Erlinda Hong felt was more consistent with possible brainstem infarct secondary to small vessel disease and not seen on MRI. She was noted to have right sided weakness with sensory deficits. Neurology recommended DAPT X 3 weeks followed by ASA alone.   PRECAUTIONS: Fall  WEIGHT BEARING RESTRICTIONS: No  PAIN:  Are you having pain? No  FALLS: Has patient fallen in last 6 months? 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  PATIENT GOALS: to get better and get back to work  OBJECTIVE:   HAND DOMINANCE: Right  ADLs: Overall ADLs: Independent with all ADLs. 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   IADLs: Shopping: mother went with her most recently Light housekeeping: Mod I, has not yet vacuumed  Meal Prep: Mod I Community mobility: currently not cleared to drive Medication management: utilizing pill box to aid in recall  Financial management: utilizing reminders Handwriting: 100% legible  MOBILITY STATUS: Independent and mild limp due to R knee pain  POSTURE COMMENTS:  No Significant postural limitations  ACTIVITY TOLERANCE: Activity tolerance: WFL for tasks completed during eval  UPPER EXTREMITY ROM:     Active ROM Right eval Left eval  Shoulder flexion ~140 WFL WNL  Shoulder abduction WNL   Shoulder adduction WNL   Shoulder extension    Shoulder internal rotation WNL   Shoulder external rotation WNL   Elbow flexion WNL   Elbow extension WNL   Wrist flexion    Wrist extension    Wrist ulnar deviation    Wrist radial deviation    Wrist pronation    Wrist supination    (Blank rows = not tested)  UPPER EXTREMITY MMT:     MMT Right eval Left eval  Shoulder flexion 5/5   Shoulder abduction    Shoulder adduction    Shoulder extension    Shoulder internal rotation    Shoulder external rotation    Middle trapezius    Lower trapezius    Elbow flexion    Elbow extension    Wrist flexion    Wrist extension    Wrist ulnar deviation    Wrist radial deviation    Wrist pronation    Wrist supination    (Blank rows = not tested)  HAND FUNCTION: Grip strength: Right: 47 lbs; Left: 55 lbs, Lateral pinch: Right: 14 lbs, Left: 15 lbs, 3 point pinch: Right: 9 lbs, Left: 10 lbs, and Tip pinch: Right 8 lbs, Left: 8 lbs  COORDINATION: Finger Nose Finger test: WNL 9 Hole Peg test: Right: 22.66 sec; Left: 24.56 sec  SENSATION: WFL  COGNITION: Overall cognitive status: No family/caregiver present to determine baseline cognitive functioning. Pt reports intermittent increased time with sequencing and difficulty with memory with new prescriptions.  VISION: Subjective report: reports fatigue in R eye as the day goes. Baseline vision: Wears glasses all the time  VISION ASSESSMENT: To be further assessed in functional context  Patient has difficulty with following activities due to following visual impairments: reports diplopia has resolved, however notes weakness in R eye at the end of the day  OBSERVATIONS: RUE fatigue with repetition with ROM and strength assessment.  Pt also reports fatigue with blow drying or styling hair.   TODAY'S TREATMENT:                                               NA, eval only  PATIENT EDUCATION: Education details: Educated on role and purpose of OT as well as potential interventions and goals for therapy based on initial evaluation findings. Person educated: Patient Education method: Explanation Education comprehension: verbalized understanding  HOME EXERCISE PROGRAM: TBD   GOALS: Goals reviewed with patient? Yes  SHORT TERM GOALS: Target date: 11/21/22  Pt will be Independent in UE strengthening HEP (grasp, pinch, and functional reach). Baseline: Goal status: INITIAL  2.  Pt will verbalize energy conservation strategies and be able to identify 2 that could be used during IADLs as well as return to work. Baseline:  Goal status: INITIAL  LONG TERM GOALS: Target date: 12/12/22  Pt will demonstrate improved RUE ROM and strength to allow for sustained use of dominant RUE during grooming tasks with </+ to one rest break. Baseline:  Goal status: INITIAL  2.  Pt will verbalize understanding of return to work recommendations. Baseline:  Goal status: INITIAL  3.  Pt will demonstrate improved grip by 5# and pinch strength by 2# to engage in functional grasp as needed for IADLs and return to work. Baseline:  Goal status: INITIAL  4.  Pt will tolerate 15 mins dynamic standing/mobility task to return to leisure and work activities. Baseline:  Goal status: INITIAL  ASSESSMENT:  CLINICAL IMPRESSION: Patient is a 47 y.o. female who was seen today for occupational therapy evaluation for impairments s/p CVA with decreased grip and pinch strength, shoulder ROM, and decreased endurance impacting independence with IADLs and work requirements. 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 Pt will benefit from skilled occupational therapy services to address strength and coordination, ROM, balance, GM/FM control,safety awareness, introduction of compensatory  strategies/AE prn, visual-perception, and implementation of an HEP to improve participation and safety during ADLs, IADLs, and return to work.   PERFORMANCE DEFICITS: in functional skills including IADLs, ROM, strength, Gross motor control, balance, endurance, decreased knowledge of precautions, vision, and UE functional use, cognitive skills including attention, memory, problem solving, safety awareness, and sequencing, and psychosocial skills including coping strategies, environmental adaptation, and routines and behaviors.   IMPAIRMENTS: are limiting patient from ADLs, IADLs, work, and leisure.   CO-MORBIDITIES: may have co-morbidities  that affects occupational performance. Patient will benefit from skilled OT to address above impairments and improve overall function.  MODIFICATION OR ASSISTANCE TO COMPLETE EVALUATION: No modification of tasks or assist  necessary to complete an evaluation.  OT OCCUPATIONAL PROFILE AND HISTORY: Detailed assessment: Review of records and additional review of physical, cognitive, psychosocial history related to current functional performance.  CLINICAL DECISION MAKING: LOW - limited treatment options, no task modification necessary  REHAB POTENTIAL: Good  EVALUATION COMPLEXITY: Low    PLAN:  OT FREQUENCY: 1-2x/week  OT DURATION: 6 weeks  PLANNED INTERVENTIONS: self care/ADL training, therapeutic exercise, therapeutic activity, neuromuscular re-education, passive range of motion, balance training, functional mobility training, ultrasound, compression bandaging, moist heat, cryotherapy, patient/family education, cognitive remediation/compensation, visual/perceptual remediation/compensation, psychosocial skills training, energy conservation, coping strategies training, and DME and/or AE instructions  RECOMMENDED OTHER SERVICES: SLP  CONSULTED AND AGREED WITH PLAN OF CARE: Patient  PLAN FOR NEXT SESSION: Initiate dynamic standing and functional reach,  provide with RUE ROM and grip strengthening HEP   Atharv Barriere, OTR/L 10/30/2022, 4:03 PM

## 2022-10-31 ENCOUNTER — Other Ambulatory Visit (HOSPITAL_BASED_OUTPATIENT_CLINIC_OR_DEPARTMENT_OTHER): Payer: Self-pay

## 2022-10-31 ENCOUNTER — Other Ambulatory Visit: Payer: Self-pay

## 2022-10-31 MED ORDER — THYROID 120 MG PO TABS
120.0000 mg | ORAL_TABLET | Freq: Every day | ORAL | 5 refills | Status: DC
  Filled 2022-10-31: qty 30, 30d supply, fill #0
  Filled 2022-12-15: qty 30, 30d supply, fill #1
  Filled 2023-04-08: qty 30, 30d supply, fill #2
  Filled 2023-05-13: qty 30, 30d supply, fill #3
  Filled 2023-06-19: qty 30, 30d supply, fill #4
  Filled 2023-09-11 (×2): qty 30, 30d supply, fill #5

## 2022-10-31 MED ORDER — THYROID 60 MG PO TABS
60.0000 mg | ORAL_TABLET | Freq: Every day | ORAL | 5 refills | Status: DC
  Filled 2022-10-31: qty 30, 30d supply, fill #0

## 2022-10-31 MED ORDER — WEGOVY 0.5 MG/0.5ML ~~LOC~~ SOAJ
0.5000 mg | SUBCUTANEOUS | 5 refills | Status: DC
  Filled 2022-10-31: qty 2, 28d supply, fill #0
  Filled 2022-12-01: qty 2, 28d supply, fill #1
  Filled 2022-12-31 – 2023-01-13 (×2): qty 2, 28d supply, fill #2

## 2022-11-04 ENCOUNTER — Other Ambulatory Visit (HOSPITAL_BASED_OUTPATIENT_CLINIC_OR_DEPARTMENT_OTHER): Payer: Self-pay

## 2022-11-07 ENCOUNTER — Ambulatory Visit: Payer: 59 | Admitting: Occupational Therapy

## 2022-11-07 ENCOUNTER — Ambulatory Visit: Payer: 59

## 2022-11-07 DIAGNOSIS — R2681 Unsteadiness on feet: Secondary | ICD-10-CM

## 2022-11-07 DIAGNOSIS — I69351 Hemiplegia and hemiparesis following cerebral infarction affecting right dominant side: Secondary | ICD-10-CM | POA: Diagnosis not present

## 2022-11-07 DIAGNOSIS — R41840 Attention and concentration deficit: Secondary | ICD-10-CM | POA: Diagnosis not present

## 2022-11-07 DIAGNOSIS — M25561 Pain in right knee: Secondary | ICD-10-CM | POA: Diagnosis not present

## 2022-11-07 DIAGNOSIS — M6281 Muscle weakness (generalized): Secondary | ICD-10-CM | POA: Diagnosis not present

## 2022-11-07 DIAGNOSIS — R2689 Other abnormalities of gait and mobility: Secondary | ICD-10-CM | POA: Diagnosis not present

## 2022-11-07 NOTE — Therapy (Signed)
OUTPATIENT OCCUPATIONAL THERAPY NEURO  Treatment Note  Patient Name: Donna Mccarthy MRN: [id number] DOB:Mar 04, 1976, 47 y.o., female Today's Date: 11/07/2022  PCP: Rita Ohara, MD REFERRING PROVIDER: Cathlyn Parsons, PA-C  END OF SESSION:  OT End of Session - 11/07/22 0830     Visit Number 2    Number of Visits 9    Date for OT Re-Evaluation 12/11/22    Authorization Type [place] AETNA FOCUS    OT Start Time 0803    OT Stop Time 0845    OT Time Calculation (min) 42 min              Past Medical History:  Diagnosis Date   Allergy    Anemia    Anxiety    Arthritis    arthritis knees   Blood transfusion without reported diagnosis    GERD (gastroesophageal reflux disease)    Hypothyroidism    Neuromuscular disorder (HCC)    Obesity (BMI 30-39.9) 01/05/2019   Past Surgical History:  Procedure Laterality Date   BIOPSY  04/03/2020   Procedure: BIOPSY;  Surgeon: Harvel Quale, MD;  [facility];  Service: Gastroenterology;;  antral, body, distal esophagus, mid esophagus   BRACHIOPLASTY Bilateral 07/02/2020   Procedure: BILATERAL BRACHIOPLASTY WITH LIPOSUCTION;  Surgeon: [name], MD;  Location: [place];  Service: Plastics;  Laterality: Bilateral;   DILATION AND CURETTAGE OF UTERUS  2022   ESOPHAGOGASTRODUODENOSCOPY (EGD) WITH PROPOFOL N/A 04/03/2020   Procedure: ESOPHAGOGASTRODUODENOSCOPY (EGD) WITH PROPOFOL;  Surgeon: Harvel Quale, MD;  [facility];  Service: Gastroenterology;  Laterality: N/A;  11:15   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   LIPOSUCTION Bilateral 07/02/2020   Procedure: LIPOSUCTION;  Surgeon: [name], MD;  Location: [place];  Service: Plastics;  Laterality: Bilateral;   PANNICULECTOMY N/A 08/04/2019   Procedure: PANNICULECTOMY;  Surgeon: [name], MD;  Location: [place];  Service: Plastics;  Laterality: N/A;  2 hours  only, please   TEE WITHOUT CARDIOVERSION N/A 10/07/2022   Procedure: TRANSESOPHAGEAL ECHOCARDIOGRAM (TEE);  Surgeon: Freada Bergeron, MD;  Location: [facility];  Service: Cardiovascular;  Laterality: N/A;   Patient Active Problem List   Diagnosis Date Noted   Panic disorder with agoraphobia and moderate panic attacks 10/14/2022   Stroke (cerebrum) ([place]) 10/08/2022   CVA (cerebral vascular accident) ([place]) 10/03/2022   Stroke determined by clinical assessment ([place]) 10/03/2022   Dysphagia 03/29/2020   Nausea with vomiting 03/29/2020   Vitamin D deficiency 02/02/2020   S/P panniculectomy 01/02/2020   Family history of diabetes mellitus in grandmother 04/20/2019   Obesity (BMI 30.0-34.9) 04/20/2019   Obesity (BMI 30-39.9) 01/05/2019   Hypothyroidism 09/29/2017   Migraine 09/29/2017   Knee pain, bilateral 09/29/2017   Generalized anxiety disorder 09/29/2017   Gastroesophageal reflux disease 09/29/2017    ONSET DATE: 10/03/22  REFERRING DIAG: I63.9 (ICD-10-CM) - Cerebral infarction, unspecified  THERAPY DIAG:  Hemiplegia and hemiparesis following cerebral infarction affecting right dominant side (HCC)  Unsteadiness on feet  Attention and concentration deficit  Muscle weakness (generalized)  Rationale for Evaluation and Treatment: Rehabilitation  SUBJECTIVE:   SUBJECTIVE STATEMENT: Pt with no c/o pain or lightheadedness. Pt accompanied by: self  PERTINENT HISTORY: 47 y.o. female who was driving [id number] and had sudden onset L facial droop, dysarthria, blurred vision. Drove herself to ED.MRI/MRA brain done and negative for stroke, stenosis or acute process. 2D echo showed EF 60-65%  with no wall abnormalities. She had dysconjugate gaze with diplopia that Dr. Erlinda Hong felt was more consistent with possible brainstem infarct secondary to small vessel disease and not seen on MRI. She was noted to have right sided weakness with sensory deficits. Neurology recommended DAPT X 3 weeks  followed by ASA alone.   PRECAUTIONS: Fall  WEIGHT BEARING RESTRICTIONS: No  PAIN:  Are you having pain? No  FALLS: Has patient fallen in last 6 months? 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  PATIENT GOALS: to get better and get back to work  OBJECTIVE:   HAND DOMINANCE: Right  ADLs: Overall ADLs: Independent with all ADLs. 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   IADLs: Shopping: mother went with her most recently Light housekeeping: Mod I, has not yet vacuumed  Meal Prep: Mod I Community mobility: currently not cleared to drive Medication management: utilizing pill box to aid in recall  Financial management: utilizing reminders Handwriting: 100% legible  MOBILITY STATUS: Independent and mild limp due to R knee pain  POSTURE COMMENTS:  No Significant postural limitations  ACTIVITY TOLERANCE: Activity tolerance: WFL for tasks completed during eval  UPPER EXTREMITY ROM:    Active ROM Right eval Left eval  Shoulder flexion ~140 WFL WNL  Shoulder abduction WNL   Shoulder adduction WNL   Shoulder extension    Shoulder internal rotation WNL   Shoulder external rotation WNL   Elbow flexion WNL   Elbow extension WNL   Wrist flexion    Wrist extension    Wrist ulnar deviation    Wrist radial deviation    Wrist pronation    Wrist supination    (Blank rows = not tested)  UPPER EXTREMITY MMT:     MMT Right eval Left eval  Shoulder flexion 5/5   Shoulder abduction    Shoulder adduction    Shoulder extension    Shoulder internal rotation    Shoulder  external rotation    Middle trapezius    Lower trapezius    Elbow flexion    Elbow extension    Wrist flexion    Wrist extension    Wrist ulnar deviation    Wrist radial deviation    Wrist pronation    Wrist supination    (Blank rows = not tested)  HAND FUNCTION: Grip strength: Right: 47 lbs; Left: 55 lbs, Lateral pinch: Right: 14 lbs, Left: 15 lbs, 3 point pinch: Right: 9 lbs, Left: 10 lbs, and Tip pinch: Right 8 lbs, Left: 8 lbs  COORDINATION: Finger Nose Finger test: WNL 9 Hole Peg test: Right: 22.66 sec; Left: 24.56 sec  SENSATION: WFL  COGNITION: Overall cognitive status: No family/caregiver present to determine baseline cognitive functioning. Pt reports intermittent increased time with sequencing and difficulty with memory with new prescriptions.  VISION: Subjective report: reports fatigue in R eye as the day goes. Baseline vision: Wears glasses all the time  VISION ASSESSMENT: To be further assessed in functional context  Patient has difficulty with following activities due to following visual impairments: reports diplopia has resolved, however notes weakness in R eye at the end of the day  OBSERVATIONS: RUE fatigue with  repetition with ROM and strength assessment.  Pt also reports fatigue with blow drying or styling hair.   TODAY'S TREATMENT:                                              11/07/22 Resistance Clothespins: 4,6,8# with RUE for low and high functional reaching and sustained pinch. OT facilitated dynamic reaching with crossing midline to reach to low range and outside BOS to R to place on vertical dowel in high range.  Pt demonstrating good weight shifting and functional reach with RUE and good sustained grasp.  Completed with D1 pattern as well, reaching down to R and then across midline to L with RUE to place clothespins on high dowel on L.  Pt tolerated standing 8-10 mins during activity without seated rest break.   Hand Gripper: with RUE on level 35# with  [place]. Pt picked up 1 inch blocks with gripper with 20%  drops and min difficulty. OT increased challenge by placing target on elevated surface to challenge sustained grasp in conjunction with shoulder flexion. Completed reaching across midline and outside BOS, with difficulty with sustained grasp with repetition - as hand fatiguing.   Energy conservation: educated on 4P's of energy conservation (pacing, planning, prioritizing, and positioning).  OT educated on positioning during work related tasks as well as planning of items and creating "tote bag/fanny pack" of most used items to allow for increased readiness and saving need to go back and forth to supply rooms throughout the day.  Provided pt with handouts from [employer] and [employer].  PATIENT EDUCATION: Education details: educated on functional reach with gross grasp and carryover to functional tasks. Person educated: Patient Education method: Explanation Education comprehension: verbalized understanding  HOME EXERCISE PROGRAM: TBD   GOALS: Goals reviewed with patient? Yes  SHORT TERM GOALS: Target date: 11/21/22  Pt will be Independent in UE strengthening HEP (grasp, pinch, and functional reach). Baseline: Goal status: IN PROGRESS  2.  Pt will verbalize energy conservation strategies and be able to identify 2 that could be used during IADLs as well as return to work. Baseline:  Goal status: IN PROGRESS  LONG TERM GOALS: Target date: 12/12/22  Pt will demonstrate improved RUE ROM and strength to allow for sustained use of dominant RUE during grooming tasks with </+ to one rest break. Baseline:  Goal status: IN PROGRESS  2.  Pt will verbalize understanding of return to work recommendations. Baseline:  Goal status: IN PROGRESS  3.  Pt will demonstrate improved grip by 5# and pinch strength by 2# to engage in functional grasp as needed for IADLs and return to work. Baseline:  Goal status: IN PROGRESS  4.  Pt will  tolerate 15 mins dynamic standing/mobility task to return to leisure and work activities. Baseline:  Goal status: IN PROGRESS  ASSESSMENT:  CLINICAL IMPRESSION: Pt tolerating standing 8-10 mins during dynamic reaching and grasp activity.  Pt demonstrating difficulty with sustained grasp on resistance gripper, especially when reaching to place in container across midline.  Pt appreciative of education on energy conservation both in the home and with work readiness.  PERFORMANCE DEFICITS: in functional skills including IADLs, ROM, strength, Gross motor control, balance, endurance, decreased knowledge of precautions, vision, and UE functional use, cognitive skills including attention, memory, problem solving, safety awareness, and sequencing, and psychosocial skills including coping strategies, environmental  adaptation, and routines and behaviors.   IMPAIRMENTS: are limiting patient from ADLs, IADLs, work, and leisure.   CO-MORBIDITIES: may have co-morbidities  that affects occupational performance. Patient will benefit from skilled OT to address above impairments and improve overall function.  MODIFICATION OR ASSISTANCE TO COMPLETE EVALUATION: No modification of tasks or assist necessary to complete an evaluation.  OT OCCUPATIONAL PROFILE AND HISTORY: Detailed assessment: Review of records and additional review of physical, cognitive, psychosocial history related to current functional performance.  CLINICAL DECISION MAKING: LOW - limited treatment options, no task modification necessary  REHAB POTENTIAL: Good  EVALUATION COMPLEXITY: Low    PLAN:  OT FREQUENCY: 1-2x/week  OT DURATION: 6 weeks  PLANNED INTERVENTIONS: self care/ADL training, therapeutic exercise, therapeutic activity, neuromuscular re-education, passive range of motion, balance training, functional mobility training, ultrasound, compression bandaging, moist heat, cryotherapy, patient/family education, cognitive  remediation/compensation, visual/perceptual remediation/compensation, psychosocial skills training, energy conservation, coping strategies training, and DME and/or AE instructions  RECOMMENDED OTHER SERVICES: SLP  CONSULTED AND AGREED WITH PLAN OF CARE: Patient  PLAN FOR NEXT SESSION: Initiate dynamic standing and functional reach, provide with RUE ROM and grip strengthening HEP, print out/discuss return to work checklist.   Ellwood Dense, [place], OTR/L 11/07/2022, 8:30 AM

## 2022-11-07 NOTE — Therapy (Signed)
OUTPATIENT PHYSICAL THERAPY NEURO EVALUATION   Patient Name: Donna Mccarthy MRN: [id number] DOB:[birth year], 47 y.o., female Today's Date: 11/07/2022   PCP: Rita Ohara, MD  REFERRING PROVIDER: Cathlyn Parsons, PA-C  END OF SESSION:  PT End of Session - 11/07/22 0848     Visit Number 2    Number of Visits 13    Date for PT Re-Evaluation 12/11/22    Authorization Type [employer]    Authorization - Number of Visits 25   before medical review   PT Start Time 0845    PT Stop Time 0930    PT Time Calculation (min) 45 min    Equipment Utilized During Treatment Gait belt    Activity Tolerance Patient tolerated treatment well    Behavior During Therapy WFL for tasks assessed/performed             Past Medical History:  Diagnosis Date   Allergy    Anemia    Anxiety    Arthritis    arthritis knees   Blood transfusion without reported diagnosis    GERD (gastroesophageal reflux disease)    Hypothyroidism    Neuromuscular disorder (HCC)    Obesity (BMI 30-39.9) 01/05/2019   Past Surgical History:  Procedure Laterality Date   BIOPSY  04/03/2020   Procedure: BIOPSY;  Surgeon: Harvel Quale, MD;  [facility];  Service: Gastroenterology;;  antral, body, distal esophagus, mid esophagus   BRACHIOPLASTY Bilateral 07/02/2020   Procedure: BILATERAL BRACHIOPLASTY WITH LIPOSUCTION;  Surgeon: [name], MD;  Location: [place];  Service: Plastics;  Laterality: Bilateral;   DILATION AND CURETTAGE OF UTERUS  2022   ESOPHAGOGASTRODUODENOSCOPY (EGD) WITH PROPOFOL N/A 04/03/2020   Procedure: ESOPHAGOGASTRODUODENOSCOPY (EGD) WITH PROPOFOL;  Surgeon: Harvel Quale, MD;  [facility];  Service: Gastroenterology;  Laterality: N/A;  11:15   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   LIPOSUCTION Bilateral 07/02/2020   Procedure: LIPOSUCTION;  Surgeon: [name], MD;  Location: [place];   Service: Plastics;  Laterality: Bilateral;   PANNICULECTOMY N/A 08/04/2019   Procedure: PANNICULECTOMY;  Surgeon: [name], MD;  Location: [place];  Service: Plastics;  Laterality: N/A;  2 hours only, please   TEE WITHOUT CARDIOVERSION N/A 10/07/2022   Procedure: TRANSESOPHAGEAL ECHOCARDIOGRAM (TEE);  Surgeon: Freada Bergeron, MD;  Location: [facility];  Service: Cardiovascular;  Laterality: N/A;   Patient Active Problem List   Diagnosis Date Noted   Panic disorder with agoraphobia and moderate panic attacks 10/14/2022   Stroke (cerebrum) ([place]) 10/08/2022   CVA (cerebral vascular accident) ([place]) 10/03/2022   Stroke determined by clinical assessment ([place]) 10/03/2022   Dysphagia 03/29/2020   Nausea with vomiting 03/29/2020   Vitamin D deficiency 02/02/2020   S/P panniculectomy 01/02/2020   Family history of diabetes mellitus in grandmother 04/20/2019   Obesity (BMI 30.0-34.9) 04/20/2019   Obesity (BMI 30-39.9) 01/05/2019   Hypothyroidism 09/29/2017   Migraine 09/29/2017   Knee pain, bilateral 09/29/2017   Generalized anxiety disorder 09/29/2017   Gastroesophageal reflux disease 09/29/2017    ONSET DATE: 10/03/22  REFERRING DIAG: I63.9 (ICD-10-CM) - Cerebral infarction, unspecified  THERAPY DIAG:  Hemiplegia and hemiparesis following cerebral infarction affecting right dominant side (HCC)  Unsteadiness on feet  Muscle weakness (generalized)  Other abnormalities of gait and mobility  Rationale for Evaluation and Treatment: Rehabilitation  SUBJECTIVE:  SUBJECTIVE STATEMENT: Right knees is feeling better, been working on exercise   Pt accompanied by: self  PERTINENT HISTORY: Anemia, anxiety, GERD, neuromuscular disorder?, migraines Per chart- "10/03/22 and had  sudden onset L facial droop, dysarthria, blurred vision."  "CN VI incomplete palsy. She had dysconjugate gaze with diplopia that Dr. Erlinda Hong felt was more consistent with possible brainstem infarct secondary to small vessel disease and not seen on MRI."  PAIN:  Are you having pain? No  PRECAUTIONS: Fall  WEIGHT BEARING RESTRICTIONS: No  FALLS: Has patient fallen in last 6 months? 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 Horizontal/vertical--denies dizziness, diplopia, gaze instability  Hand-off VOR cancellation No motion sensitivity or gaze instability noted   VOR x 1  No issues reported  Hip abduction iso 2x10 -physioball against wall--poor coordination for it  Standing on foam -EO/EC x 30 sec -head/trunk turns 3x EO/EC  Rocker boardan Ant-post, lateral        DIAGNOSTIC FINDINGS: 10/04/22 brain MRI: . No acute intracranial process. No evidence of acute or subacute infarct.No intracranial large vessel occlusion or significant stenosis.  COGNITION: Overall cognitive status: Within functional limits for tasks assessed and reports c/o occasional word finding issues   SENSATION: Report of numbness in the R 3 lateral toes   COORDINATION: Alternating pronation/supination: intact B Alternating toe tap: intact B Finger to nose: intact B Heel to shin: intact B   PALPATION: TTP over R lateral  jt line  MUSCLE TONE: LLE: Mild in L knee and ankle    POSTURE: rounded shoulders  LOWER EXTREMITY ROM:     Active  Right Eval Left Eval  Hip flexion    Hip extension    Hip abduction    Hip adduction    Hip internal rotation    Hip external rotation    Knee flexion    Knee extension    Ankle dorsiflexion 19 3  Ankle plantarflexion    Ankle inversion    Ankle eversion     (Blank rows = not tested)  LOWER EXTREMITY MMT:    MMT (in sitting) Right Eval Left Eval  Hip flexion 4- 4  Hip extension    Hip abduction 4- 4  Hip adduction 4 4  Hip internal rotation    Hip external rotation    Knee flexion 4+ 4+  Knee extension 4 4  Ankle dorsiflexion 4 4+  Ankle plantarflexion 4- 4+  Ankle inversion    Ankle eversion    (Blank rows = not tested)    VESTIBULAR ASSESSMENT   GENERAL OBSERVATION: pt wears bifocals     OCULOMOTOR EXAM:   Ocular Alignment: normal   Ocular ROM:  limited L inferior ROM   Spontaneous Nystagmus: absent   Gaze-Induced Nystagmus: absent   Smooth Pursuits:  intact; c/o strain in the R eye   Saccades:  slight overshooting to R and inferior  Convergence/Divergence: ~5 inches, when trialed again limited to ~1 ft away     VESTIBULAR - OCULAR REFLEX:    Slow VOR: Normal c/o dizziness horizontal and vertical   VOR Cancellation: Normal   GAIT: Gait pattern: antalgia on R LE with decreased L step length Assistive device utilized: None Level of assistance: Modified independence  FUNCTIONAL TESTS:   5xSTS: 16.76 sec without Ues        PATIENT SURVEYS:  FOTO NT- pt had to leave  TODAY'S TREATMENT:                                                                                                                              DATE: 10/30/22    PATIENT EDUCATION: Education details: prognosis, POC, exam findings Person educated: Patient Education method: Explanation Education comprehension: verbalized understanding  HOME EXERCISE  PROGRAM: Not yet initiated     GOALS: Goals reviewed with patient? Yes  SHORT TERM GOALS: Target date: 11/20/2022  Patient to be independent with initial HEP. Baseline: HEP initiated Goal status: INITIAL    LONG TERM GOALS: Target date: 12/11/2022  Patient to be independent with advanced HEP. Baseline: Not yet initiated  Goal status: INITIAL  Patient to demonstrate B LE strength >/=4+/5.  Baseline: See above Goal status: INITIAL  Patient to demonstrate 10 deg L ankle dorsiflexion AROM WFL. Baseline: 3 deg Goal status: INITIAL  Patient to score at least 27/30 on FGA in order to decrease risk of falls.  Baseline: 23 Goal status: INITIAL  Patient to demonstrate symmetrical step length with ambulation without device.  Baseline: antalgia on R LE Goal status: INITIAL  Patient to demonstrate 5xSTS test in <12 sec in order to decrease risk of falls.  Baseline: 16 sec Goal status: INITIAL     ASSESSMENT:  CLINICAL IMPRESSION: Demo lingering gait deviaton of right lateral lean in stance phase. Reduced power on right plantarflexion noted. Continued with tx interventions to improve RLE recuitment and balance. Performed various visual tracking and VOR activities without issue reported by pt as in no dizziness or gaze instability reported. Continude sessions to advance POC   OBJECTIVE IMPAIRMENTS: Abnormal gait, decreased activity tolerance, decreased balance, difficulty walking, decreased ROM, decreased strength, dizziness, impaired flexibility, improper body mechanics, and pain.   ACTIVITY LIMITATIONS: carrying, lifting, bending, standing, squatting, stairs, transfers, bathing, toileting, dressing, hygiene/grooming, and locomotion level  PARTICIPATION LIMITATIONS: meal prep, cleaning, laundry, driving, shopping, community activity, occupation, and church  PERSONAL FACTORS: Age, Past/current experiences, Time since onset of injury/illness/exacerbation, Transportation, and 3+  comorbidities: Anemia, anxiety, GERD, neuromuscular disorder?, migraines  are also affecting patient's functional outcome.   REHAB POTENTIAL: Good  CLINICAL DECISION MAKING: Evolving/moderate complexity  EVALUATION COMPLEXITY: Moderate  PLAN:  PT FREQUENCY: 1-2x/week  PT DURATION: 6 weeks  PLANNED INTERVENTIONS: Therapeutic exercises, Therapeutic activity, Neuromuscular re-education, Balance training, Gait training, Patient/Family education, Self Care, Joint mobilization, Stair training, Vestibular training, Canalith repositioning, Aquatic Therapy, Dry Needling, Electrical stimulation, Cryotherapy,  Moist heat, Taping, Manual therapy, and Re-evaluation  PLAN FOR NEXT SESSION: initiate HEP for functional head movements with balance, convergence, LE strengthening, L calf stretch   9:30 AM, 11/07/22 M. Sherlyn Lees, PT, DPT Physical Therapist- [facility] Office Number: [phone]

## 2022-11-12 NOTE — Therapy (Signed)
OUTPATIENT PHYSICAL THERAPY NEURO TREATMENT   Patient Name: Donna Mccarthy MRN: [id number] DOB:09-Jul-1976, 47 y.o., female Today's Date: 11/13/2022   PCP: Rita Ohara, MD  REFERRING PROVIDER: Cathlyn Parsons, PA-C  END OF SESSION:  PT End of Session - 11/13/22 1706     Visit Number 3    Number of Visits 13    Date for PT Re-Evaluation 12/11/22    Authorization Type [employer]    Authorization - Number of Visits 25   before medical review   PT Start Time 1619    PT Stop Time 1700    PT Time Calculation (min) 41 min    Equipment Utilized During Treatment Gait belt    Activity Tolerance Patient tolerated treatment well    Behavior During Therapy WFL for tasks assessed/performed              Past Medical History:  Diagnosis Date   Allergy    Anemia    Anxiety    Arthritis    arthritis knees   Blood transfusion without reported diagnosis    GERD (gastroesophageal reflux disease)    Hypothyroidism    Neuromuscular disorder (HCC)    Obesity (BMI 30-39.9) 01/05/2019   Past Surgical History:  Procedure Laterality Date   BIOPSY  04/03/2020   Procedure: BIOPSY;  Surgeon: Harvel Quale, MD;  [facility];  Service: Gastroenterology;;  antral, body, distal esophagus, mid esophagus   BRACHIOPLASTY Bilateral 07/02/2020   Procedure: BILATERAL BRACHIOPLASTY WITH LIPOSUCTION;  Surgeon: [name], MD;  Location: [place];  Service: Plastics;  Laterality: Bilateral;   DILATION AND CURETTAGE OF UTERUS  2022   ESOPHAGOGASTRODUODENOSCOPY (EGD) WITH PROPOFOL N/A 04/03/2020   Procedure: ESOPHAGOGASTRODUODENOSCOPY (EGD) WITH PROPOFOL;  Surgeon: Harvel Quale, MD;  [facility];  Service: Gastroenterology;  Laterality: N/A;  11:15   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   LIPOSUCTION Bilateral 07/02/2020   Procedure: LIPOSUCTION;  Surgeon: [name], MD;  Location: [place];   Service: Plastics;  Laterality: Bilateral;   PANNICULECTOMY N/A 08/04/2019   Procedure: PANNICULECTOMY;  Surgeon: [name], MD;  Location: [place];  Service: Plastics;  Laterality: N/A;  2 hours only, please   TEE WITHOUT CARDIOVERSION N/A 10/07/2022   Procedure: TRANSESOPHAGEAL ECHOCARDIOGRAM (TEE);  Surgeon: Freada Bergeron, MD;  Location: [facility];  Service: Cardiovascular;  Laterality: N/A;   Patient Active Problem List   Diagnosis Date Noted   Panic disorder with agoraphobia and moderate panic attacks 10/14/2022   Stroke (cerebrum) ([place]) 10/08/2022   CVA (cerebral vascular accident) ([place]) 10/03/2022   Stroke determined by clinical assessment ([place]) 10/03/2022   Dysphagia 03/29/2020   Nausea with vomiting 03/29/2020   Vitamin D deficiency 02/02/2020   S/P panniculectomy 01/02/2020   Family history of diabetes mellitus in grandmother 04/20/2019   Obesity (BMI 30.0-34.9) 04/20/2019   Obesity (BMI 30-39.9) 01/05/2019   Hypothyroidism 09/29/2017   Migraine 09/29/2017   Knee pain, bilateral 09/29/2017   Generalized anxiety disorder 09/29/2017   Gastroesophageal reflux disease 09/29/2017    ONSET DATE: 10/03/22  REFERRING DIAG: I63.9 (ICD-10-CM) - Cerebral infarction, unspecified  THERAPY DIAG:  Unsteadiness on feet  Muscle weakness (generalized)  Other abnormalities of gait and mobility  Rationale for Evaluation and Treatment: Rehabilitation  SUBJECTIVE:  SUBJECTIVE STATEMENT: Feeling like I'm getting better. Notes improvements in grip strength and walking.  Pt accompanied by: self  PERTINENT HISTORY: Anemia, anxiety, GERD, neuromuscular disorder?, migraines Per chart- "10/03/22 and had sudden onset L facial droop, dysarthria, blurred vision."  "CN VI  incomplete palsy. She had dysconjugate gaze with diplopia that Dr. Erlinda Hong felt was more consistent with possible brainstem infarct secondary to small vessel disease and not seen on MRI."  PAIN:  Are you having pain? No  PRECAUTIONS: Fall  WEIGHT BEARING RESTRICTIONS: No  FALLS: Has patient fallen in last 6 months? 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  PATIENT GOALS: work on endurance, strength, getting back to work   OBJECTIVE:     TODAY'S TREATMENT: 11/13/22 Activity Comments  gait training on TM at 1.5 mph x6  Cueing for heel toe pattern, increasing step height; with/without using B handrails   Gait training outside on sidewalk, on ramps 555ft Cueing to increase arms wing   STS with R foot back 10# 10x Focus on eccentric control  brock string with 3 beads at 8 11.5 15 inches Some difficulty focusing on most proximal bead   Split squats with counter support, walking lunges    Squat to mat with 10# Cues to avoid valgus and shifting hips back  L gastroc stretch on cabinet 30" each      HOME EXERCISE PROGRAM Last updated: 11/13/22 Access Code: [email or web address] URL: [email or web address] Date: 11/13/2022 Prepared by: [facility]  Exercises - Mikel Cella  - 1 x daily - 5 x weekly - 2 sets - 10-20 reps - Walking Forward Lunge  - 1 x daily - 5 x weekly - 2 sets - 10 reps - Squat with Chair Touch  - 1 x daily - 5 x weekly - 2 sets - 10 reps - Gastroc Stretch with Foot at [place]  - 1 x daily - 5 x weekly - 2 sets - 30 sec hold  PATIENT EDUCATION: Education details: HEP update; encouraged slow return to prior exercise program as able, walking program Person educated:  Patient Education method: Explanation, Demonstration, Tactile cues, Verbal cues, and Handouts Education comprehension: verbalized understanding and returned demonstration   Below measures were taken at time of initial evaluation unless otherwise specified:   DIAGNOSTIC FINDINGS: 10/04/22 brain MRI: . No acute intracranial process. No evidence of acute or subacute infarct.No intracranial large vessel occlusion or significant stenosis.  COGNITION: Overall cognitive status: Within functional limits for tasks assessed and reports c/o occasional word finding issues   SENSATION: Report of numbness in the R 3 lateral toes   COORDINATION: Alternating pronation/supination: intact B Alternating toe tap: intact B Finger to nose: intact B Heel to shin: intact B   PALPATION: TTP over R lateral jt line  MUSCLE TONE: LLE: Mild in L knee and ankle    POSTURE: rounded shoulders  LOWER EXTREMITY ROM:     Active  Right Eval Left Eval  Hip flexion    Hip extension    Hip abduction    Hip adduction    Hip internal rotation    Hip external rotation    Knee flexion    Knee extension    Ankle dorsiflexion 19 3  Ankle plantarflexion    Ankle inversion    Ankle eversion     (Blank rows = not tested)  LOWER EXTREMITY MMT:    MMT (in sitting) Right Eval Left Eval  Hip flexion 4- 4  Hip extension    Hip abduction 4- 4  Hip adduction 4 4  Hip internal rotation    Hip external rotation    Knee flexion 4+ 4+  Knee extension 4 4  Ankle dorsiflexion 4 4+  Ankle plantarflexion 4- 4+  Ankle inversion    Ankle eversion    (Blank rows = not tested)    VESTIBULAR ASSESSMENT   GENERAL OBSERVATION: pt wears bifocals     OCULOMOTOR EXAM:   Ocular Alignment: normal   Ocular ROM:  limited L inferior ROM   Spontaneous Nystagmus: absent   Gaze-Induced Nystagmus: absent   Smooth Pursuits:  intact; c/o strain in the R eye   Saccades:  slight overshooting to R and  inferior   Convergence/Divergence: ~5 inches, when trialed again limited to ~1 ft away     VESTIBULAR - OCULAR REFLEX:    Slow VOR: Normal c/o dizziness horizontal and vertical   VOR Cancellation: Normal   GAIT: Gait pattern: antalgia on R LE with decreased L step length Assistive device utilized: None Level of assistance: Modified independence  FUNCTIONAL TESTS:   5xSTS: 16.76 sec without Ues        PATIENT SURVEYS:  FOTO NT- pt had to leave  TODAY'S TREATMENT:                                                                                                                              DATE: 10/30/22    PATIENT EDUCATION: Education details: prognosis, POC, exam findings Person educated: Patient Education method: Explanation Education comprehension: verbalized understanding  HOME EXERCISE PROGRAM: Not yet initiated     GOALS: Goals reviewed with patient? Yes  SHORT TERM GOALS: Target date: 11/20/2022  Patient to be independent with initial HEP. Baseline: HEP initiated Goal status: IN PROGRESS    LONG TERM GOALS: Target date: 12/11/2022  Patient to be independent with advanced HEP. Baseline: Not yet initiated  Goal status: IN PROGRESS  Patient to demonstrate B LE strength >/=4+/5.  Baseline: See above Goal status: IN PROGRESS  Patient to demonstrate 10 deg L ankle dorsiflexion AROM WFL. Baseline: 3 deg Goal status: IN PROGRESS  Patient to score at least 27/30 on FGA in order to decrease risk of falls.  Baseline: 23 Goal status: IN PROGRESS  Patient to demonstrate symmetrical step length with ambulation without device.  Baseline: antalgia on R LE Goal status: IN PROGRESS  Patient to demonstrate 5xSTS test in <12 sec in order to decrease risk of falls.  Baseline: 16 sec Goal status: IN PROGRESS     ASSESSMENT:  CLINICAL IMPRESSION: Patient arrived to session with report of improvements in grip strength and walking pattern. Worked on gait  training on  treadmill with good ability to elongate steps and improve step height when prompted. Overground gait training showed good carryover of stepping continuity; added cues for arm swing. Worked on initiating strength training activities to assist patient in returning to prior fitness regimen, which were performed without pain. Patient reported understanding of all edu provided and without complaints at end of session.      OBJECTIVE IMPAIRMENTS: Abnormal gait, decreased activity tolerance, decreased balance, difficulty walking, decreased ROM, decreased strength, dizziness, impaired flexibility, improper body mechanics, and pain.   ACTIVITY LIMITATIONS: carrying, lifting, bending, standing, squatting, stairs, transfers, bathing, toileting, dressing, hygiene/grooming, and locomotion level  PARTICIPATION LIMITATIONS: meal prep, cleaning, laundry, driving, shopping, community activity, occupation, and church  PERSONAL FACTORS: Age, Past/current experiences, Time since onset of injury/illness/exacerbation, Transportation, and 3+ comorbidities: Anemia, anxiety, GERD, neuromuscular disorder?, migraines  are also affecting patient's functional outcome.   REHAB POTENTIAL: Good  CLINICAL DECISION MAKING: Evolving/moderate complexity  EVALUATION COMPLEXITY: Moderate  PLAN:  PT FREQUENCY: 1-2x/week  PT DURATION: 6 weeks  PLANNED INTERVENTIONS: Therapeutic exercises, Therapeutic activity, Neuromuscular re-education, Balance training, Gait training, Patient/Family education, Self Care, Joint mobilization, Stair training, Vestibular training, Canalith repositioning, Aquatic Therapy, Dry Needling, Electrical stimulation, Cryotherapy, Moist heat, Taping, Manual therapy, and Re-evaluation  PLAN FOR NEXT SESSION: review HEP, convergence, LE strengthening, L calf stretch   Janene Harvey, PT, DPT 11/13/22 5:10 PM  [facility] at [facility] [address]  [place], [place] [address] Phone # [phone] Fax # [phone]

## 2022-11-13 ENCOUNTER — Ambulatory Visit: Payer: 59 | Admitting: Physical Therapy

## 2022-11-13 ENCOUNTER — Encounter: Payer: Self-pay | Admitting: Physical Therapy

## 2022-11-13 DIAGNOSIS — M25561 Pain in right knee: Secondary | ICD-10-CM | POA: Diagnosis not present

## 2022-11-13 DIAGNOSIS — R2689 Other abnormalities of gait and mobility: Secondary | ICD-10-CM | POA: Diagnosis not present

## 2022-11-13 DIAGNOSIS — M6281 Muscle weakness (generalized): Secondary | ICD-10-CM

## 2022-11-13 DIAGNOSIS — I69351 Hemiplegia and hemiparesis following cerebral infarction affecting right dominant side: Secondary | ICD-10-CM | POA: Diagnosis not present

## 2022-11-13 DIAGNOSIS — R2681 Unsteadiness on feet: Secondary | ICD-10-CM | POA: Diagnosis not present

## 2022-11-13 DIAGNOSIS — R41840 Attention and concentration deficit: Secondary | ICD-10-CM | POA: Diagnosis not present

## 2022-11-18 NOTE — Progress Notes (Deleted)
[facility] [address] [place]. [place] [address] [phone]       HOSPITAL FOLLOW UP NOTE  [name] Date of Birth:  [birth year] Medical Record Number:  [id number]   Reason for Referral:  hospital stroke follow up    SUBJECTIVE:   CHIEF COMPLAINT:  No chief complaint on file.   HPI:   Ms. Donna Mccarthy is a 47 y.o. female with history of recent COVID 2 weeks ago, and migraine who presented on 10/03/2022 with slurred speech, blurry vision, double vision, left facial droop during driving.  TNK given at med center [facility] and transferred to [facility].  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  CTA head/neck question of bilateral MCA PCA narrowing.  MRA negative LVO or hemodynamically significant stenosis.  EF 60 to 65%.  TEE showed EF 60 to 65% without evidence of thrombus or PFO.  TCD bubble negative PFO.  LDL 83.  A1c 5.0.  Recommended DAPT for 3 weeks and aspirin alone and atorvastatin 20 mg daily.  Evaluated by therapies who recommended discharge to CIR for ongoing therapy needs.         PERTINENT IMAGING  Per hospitalization 10/03/2022 -10/08/2022 CT no acute abnormality CT head and neck questionable bilateral MCA and PCA narrowing MRI: Negative for acute process MRA: No LVO or significant stenosis 2D Echo: Left ventricular ejection fraction 60 to 65%, left ventricular diastolic parameters normal, trivial mitral valve regurgitation, no left ventricular hypertrophy trivial tricuspid valve regurgitation. TEE: Left ventricular ejection fraction 60 to 65%, no left atrial or left atrial appendage thrombus detected, no evidence of pericardial effusion, trivial mitral valve regurgitation, trivial tricuspid valve regurgitation no aortic valve regurgitation seen no pulmonic valve regurgitation seen no interatrial shunting detected by color Doppler. TCD bubble study no right-to-left  shunt LDL 83 HgbA1c 5.0    ROS:   14 system review of systems performed and negative with exception of ***  PMH:  Past Medical History:  Diagnosis Date   Allergy    Anemia    Anxiety    Arthritis    arthritis knees   Blood transfusion without reported diagnosis    GERD (gastroesophageal reflux disease)    Hypothyroidism    Neuromuscular disorder (HCC)    Obesity (BMI 30-39.9) 01/05/2019    PSH:  Past Surgical History:  Procedure Laterality Date   BIOPSY  04/03/2020   Procedure: BIOPSY;  Surgeon: Harvel Quale, MD;  [facility];  Service: Gastroenterology;;  antral, body, distal esophagus, mid esophagus   BRACHIOPLASTY Bilateral 07/02/2020   Procedure: BILATERAL BRACHIOPLASTY WITH LIPOSUCTION;  Surgeon: [name], MD;  Location: [facility];  Service: Plastics;  Laterality: Bilateral;   DILATION AND CURETTAGE OF UTERUS  2022   ESOPHAGOGASTRODUODENOSCOPY (EGD) WITH PROPOFOL N/A 04/03/2020   Procedure: ESOPHAGOGASTRODUODENOSCOPY (EGD) WITH PROPOFOL;  Surgeon: Harvel Quale, MD;  Location: [facility];  Service: Gastroenterology;  Laterality: N/A;  11:15   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;  Service: Cardiovascular;  Laterality: N/A;    Social History:  Social History   Socioeconomic History   Marital status: Single    Spouse name: Not on file   Number of children: 1   Years of education: 14    Highest education level: Associate degree: occupational, Hotel manager, or vocational program  Occupational History   Occupation: cma    Employer: [employer]  Tobacco Use   Smoking status: Never   Smokeless tobacco: Never  Vaping Use   Vaping Use: Never used  Substance and Sexual Activity   Alcohol use: No   Drug use: No   Sexual activity: Yes    Birth control/protection: I.U.D.  Other Topics Concern   Not on file  Social History Narrative   Lives alone.   Son [name] lives in [place], [place], girlfriend is pregnant.  Graduates from school on 07/2022.   Works in [employer] pool      Updated 04/2022   Social Determinants of Health   Financial Resource Strain: Not on file  Food Insecurity: No Food Insecurity (10/03/2022)   Hunger Vital Sign    Worried About Running Out of Food in the Last Year: Never true    Ran Out of Food in the Last Year: Never true  Transportation Needs: No Transportation Needs (10/03/2022)   PRAPARE - Hydrologist (Medical): No    Lack of Transportation (Non-Medical): No  Physical Activity: Not on file  Stress: Not on file  Social Connections: Not on file  Intimate Partner Violence: Not At Risk (10/03/2022)   Humiliation, Afraid, Rape, and Kick questionnaire    Fear of Current or Ex-Partner: No    Emotionally Abused: No    Physically Abused: No    Sexually Abused: No    Family History:  Family History  Problem Relation Age of Onset   Hypertension Mother    Crohn's disease Mother    Colon polyps Mother    Hypertension Father        POTS   Stroke Father        TIA   Hyperlipidemia Father    Hypertension Brother    Breast cancer Maternal Grandmother    Diabetes Maternal Grandmother    Heart failure Maternal Grandmother    Thyroid disease Paternal Grandmother    Vitamin D deficiency Paternal Grandmother    Heart failure Paternal Grandfather    Heart attack Paternal Grandfather    Colon cancer Maternal Uncle        dx'd in  70's   Colon cancer Maternal Uncle 58   Breast cancer Maternal Aunt     Medications:   Current Outpatient Medications on File Prior to Visit  Medication Sig Dispense Refill   aspirin 81 MG chewable tablet Chew 1 tablet (81 mg total) by mouth daily. 100 tablet 0   atorvastatin (LIPITOR) 20 MG tablet Take 1 tablet (20 mg total) by mouth daily. 30 tablet 0   buPROPion (WELLBUTRIN XL) 300 MG 24 hr tablet Take 1 tablet (300 mg total) by mouth daily. 90 tablet 0   butalbital-acetaminophen-caffeine (FIORICET) 50-325-40 MG tablet Take 1 tablet by mouth daily as needed for headache. (Patient not taking: Reported on 10/29/2022) 5 tablet 0   clopidogrel (PLAVIX) 75 MG tablet Take 1 tablet (75 mg total) by mouth daily. (Patient not taking: Reported on 10/29/2022) 2 tablet 0   diclofenac Sodium (VOLTAREN) 1 % GEL Apply 2  g topically 4 (four) times daily. (Patient not taking: Reported on 10/29/2022) 350 g 0   fluticasone (FLONASE) 50 MCG/ACT nasal spray Place 2 sprays into both nostrils as needed for allergies. (Patient not taking: Reported on 10/29/2022)     loratadine (CLARITIN) 10 MG tablet Take 10 mg by mouth daily as needed for allergies. (Patient not taking: Reported on 10/29/2022)     Magnesium 250 MG TABS Take 250 mg by mouth daily. (Patient not taking: Reported on 10/29/2022)     pantoprazole (PROTONIX) 40 MG tablet Take 1 tablet (40 mg total) by mouth at bedtime. 30 tablet 0   Semaglutide-Weight Management (WEGOVY) 0.5 MG/0.5ML SOAJ Inject 0.5 mg into the skin once a week. 2 mL 5   Semaglutide-Weight Management 0.5 MG/0.5ML SOAJ Inject 0.5 mg into the skin. (Patient not taking: Reported on 10/29/2022)     thyroid (NP THYROID) 120 MG tablet Take 120 mg by mouth daily before breakfast.     thyroid (NP THYROID) 120 MG tablet Take 1 tablet (120 mg total) by mouth daily on an empty stomach 30 tablet 5   thyroid (NP THYROID) 60 MG tablet Take 60 mg by mouth daily before breakfast.     thyroid (NP THYROID) 60  MG tablet Take 1 tablet (60 mg total) by mouth daily on an empty stomach 30 tablet 5   topiramate (TOPAMAX) 25 MG tablet Take 3 tablets (75 mg total) by mouth at bedtime. 90 tablet 0   Vitamin D, Ergocalciferol, (DRISDOL) 1.25 MG (50000 UNIT) CAPS capsule Take 1 capsule (50,000 Units total) by mouth every 7 (seven) days. 12 capsule 0   No current facility-administered medications on file prior to visit.    Allergies:   Allergies  Allergen Reactions   Ketorolac Tromethamine Other (See Comments)    "felt like whole body was on fire"   Penicillins Other (See Comments)    Mother said she had a rash   Celebrex [Celecoxib] Itching    itching   Egg-Derived Products Other (See Comments)      OBJECTIVE:  Physical Exam  There were no vitals filed for this visit. There is no height or weight on file to calculate BMI. No results found.     09/29/2022   11:50 AM  Depression screen PHQ 2/9  Decreased Interest 0  Down, Depressed, Hopeless 0  PHQ - 2 Score 0  Altered sleeping 2  Tired, decreased energy 1  Change in appetite 0  Feeling bad or failure about yourself  0  Trouble concentrating 1  Moving slowly or fidgety/restless 1  Suicidal thoughts 0  PHQ-9 Score 5  Difficult doing work/chores Not difficult at all     General: well developed, well nourished, seated, in no evident distress Head: head normocephalic and atraumatic.   Neck: supple with no carotid or supraclavicular bruits Cardiovascular: regular rate and rhythm, no murmurs Musculoskeletal: no deformity Skin:  no rash/petichiae Vascular:  Normal pulses all extremities   Neurologic Exam Mental Status: Awake and fully alert. Oriented to place and time. Recent and remote memory intact. Attention span, concentration and fund of knowledge appropriate. Mood and affect appropriate.  Cranial Nerves: Fundoscopic exam reveals sharp disc margins. Pupils equal, briskly reactive to light. Extraocular movements full without  nystagmus. Visual fields full to confrontation. Hearing intact. Facial sensation intact. Face, tongue, palate moves normally and symmetrically.  Motor: Normal bulk and tone. Normal strength in all tested extremity muscles Sensory.: intact to touch , pinprick , position and vibratory  sensation.  Coordination: Rapid alternating movements normal in all extremities. Finger-to-nose and heel-to-shin performed accurately bilaterally. Gait and Station: Arises from chair without difficulty. Stance is normal. Gait demonstrates normal stride length and balance with ***. Tandem walk and heel toe ***.  Reflexes: 1+ and symmetric. Toes downgoing.     NIHSS  *** Modified Rankin  ***      ASSESSMENT: Donna Mccarthy is a 47 y.o. year old female possible brainstem infarct on 10/03/2022 secondary to small vessel disease source. Vascular risk factors include HTN, HLD and migraine headaches.      PLAN:  DWI negative stroke :  Residual deficit: ***.  Continue aspirin 81mg  daily and atorvastatin (Lipitor) for secondary stroke prevention.   Discussed secondary stroke prevention measures and importance of close PCP follow up for aggressive stroke risk factor management including BP goal<130/90, HLD with LDL goal<70 and DM with A1c.<7 .  Stroke labs 09/2022: LDL 83, A1c 5.0 I have gone over the pathophysiology of stroke, warning signs and symptoms, risk factors and their management in some detail with instructions to go to the closest emergency room for symptoms of concern.     Follow up in *** or call earlier if needed   CC:  [facility] provider: Dr. Leonie Man PCP: Rita Ohara, MD    I spent *** minutes of face-to-face and non-face-to-face time with patient.  This included previsit chart review including review of recent hospitalization, lab review, study review, order entry, electronic health record documentation, patient education regarding recent stroke including etiology, secondary stroke prevention  measures and importance of managing stroke risk factors, residual deficits and typical recovery time and answered all other questions to patient satisfaction   Frann Rider, AGNP-BC  [facility] [address] [place], [place] [address]  Phone [phone] Fax [phone] Note: This document was prepared with digital dictation and possible smart phrase technology. Any transcriptional errors that result from this process are unintentional.

## 2022-11-19 ENCOUNTER — Other Ambulatory Visit (HOSPITAL_BASED_OUTPATIENT_CLINIC_OR_DEPARTMENT_OTHER): Payer: Self-pay

## 2022-11-19 ENCOUNTER — Ambulatory Visit: Payer: 59 | Attending: Physician Assistant

## 2022-11-19 ENCOUNTER — Encounter: Payer: Self-pay | Admitting: Neurology

## 2022-11-19 ENCOUNTER — Ambulatory Visit: Payer: 59 | Admitting: Neurology

## 2022-11-19 ENCOUNTER — Inpatient Hospital Stay: Payer: 59 | Admitting: Adult Health

## 2022-11-19 VITALS — BP 105/70 | HR 75 | Ht 65.0 in | Wt 180.0 lb

## 2022-11-19 DIAGNOSIS — R2689 Other abnormalities of gait and mobility: Secondary | ICD-10-CM | POA: Diagnosis not present

## 2022-11-19 DIAGNOSIS — R41840 Attention and concentration deficit: Secondary | ICD-10-CM | POA: Insufficient documentation

## 2022-11-19 DIAGNOSIS — R2681 Unsteadiness on feet: Secondary | ICD-10-CM | POA: Diagnosis not present

## 2022-11-19 DIAGNOSIS — E039 Hypothyroidism, unspecified: Secondary | ICD-10-CM | POA: Diagnosis not present

## 2022-11-19 DIAGNOSIS — M6281 Muscle weakness (generalized): Secondary | ICD-10-CM | POA: Insufficient documentation

## 2022-11-19 DIAGNOSIS — I639 Cerebral infarction, unspecified: Secondary | ICD-10-CM

## 2022-11-19 DIAGNOSIS — I69351 Hemiplegia and hemiparesis following cerebral infarction affecting right dominant side: Secondary | ICD-10-CM | POA: Insufficient documentation

## 2022-11-19 MED ORDER — SUMATRIPTAN SUCCINATE 50 MG PO TABS
50.0000 mg | ORAL_TABLET | ORAL | 2 refills | Status: DC | PRN
  Filled 2022-11-19: qty 10, 20d supply, fill #0

## 2022-11-19 MED ORDER — BUTALBITAL-APAP-CAFFEINE 50-325-40 MG PO TABS
1.0000 | ORAL_TABLET | Freq: Every day | ORAL | 1 refills | Status: DC | PRN
  Filled 2022-11-19: qty 10, 10d supply, fill #0

## 2022-11-19 NOTE — Therapy (Signed)
OUTPATIENT PHYSICAL THERAPY NEURO TREATMENT   Patient Name: Donna Mccarthy MRN: [id number] DOB:September 15, 1975, 47 y.o., female Today's Date: 11/19/2022   PCP: Rita Ohara, MD  REFERRING PROVIDER: Cathlyn Parsons, PA-C  END OF SESSION:  PT End of Session - 11/19/22 1616     Visit Number 4    Number of Visits 13    Date for PT Re-Evaluation 12/11/22    Authorization Type [employer]    Authorization - Number of Visits 25   before medical review   PT Start Time [id number]    PT Stop Time 1700    PT Time Calculation (min) 45 min    Equipment Utilized During Treatment Gait belt    Activity Tolerance Patient tolerated treatment well    Behavior During Therapy WFL for tasks assessed/performed              Past Medical History:  Diagnosis Date   Allergy    Anemia    Anxiety    Arthritis    arthritis knees   Blood transfusion without reported diagnosis    GERD (gastroesophageal reflux disease)    Hypothyroidism    Neuromuscular disorder    Obesity (BMI 30-39.9) 01/05/2019   Past Surgical History:  Procedure Laterality Date   BIOPSY  04/03/2020   Procedure: BIOPSY;  Surgeon: Harvel Quale, MD;  Location: [facility];  Service: Gastroenterology;;  antral, body, distal esophagus, mid esophagus   BRACHIOPLASTY Bilateral 07/02/2020   Procedure: BILATERAL BRACHIOPLASTY WITH LIPOSUCTION;  Surgeon: [name], MD;  Location: [place];  Service: Plastics;  Laterality: Bilateral;   DILATION AND CURETTAGE OF UTERUS  2022   ESOPHAGOGASTRODUODENOSCOPY (EGD) WITH PROPOFOL N/A 04/03/2020   Procedure: ESOPHAGOGASTRODUODENOSCOPY (EGD) WITH PROPOFOL;  Surgeon: Harvel Quale, MD;  Location: [facility];  Service: Gastroenterology;  Laterality: N/A;  11:15   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   LIPOSUCTION Bilateral 07/02/2020   Procedure: LIPOSUCTION;  Surgeon: [name], MD;  Location: [place];  Service:  Plastics;  Laterality: Bilateral;   PANNICULECTOMY N/A 08/04/2019   Procedure: PANNICULECTOMY;  Surgeon: [name], MD;  Location: [place];  Service: Plastics;  Laterality: N/A;  2 hours only, please   TEE WITHOUT CARDIOVERSION N/A 10/07/2022   Procedure: TRANSESOPHAGEAL ECHOCARDIOGRAM (TEE);  Surgeon: Freada Bergeron, MD;  Location: [facility];  Service: Cardiovascular;  Laterality: N/A;   Patient Active Problem List   Diagnosis Date Noted   Panic disorder with agoraphobia and moderate panic attacks 10/14/2022   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   CVA (cerebral vascular accident) 10/03/2022   Stroke determined by clinical assessment 10/03/2022   Dysphagia 03/29/2020   Nausea with vomiting 03/29/2020   Vitamin D deficiency 02/02/2020   S/P panniculectomy 01/02/2020   Family history of diabetes mellitus in grandmother 04/20/2019   Obesity (BMI 30.0-34.9) 04/20/2019   Obesity (BMI 30-39.9) 01/05/2019   Hypothyroidism 09/29/2017   Migraine 09/29/2017   Knee pain, bilateral 09/29/2017   Generalized anxiety disorder 09/29/2017   Gastroesophageal reflux disease 09/29/2017    ONSET DATE: 10/03/22  REFERRING DIAG: I63.9 (ICD-10-CM) - Cerebral infarction, unspecified  THERAPY DIAG:  Unsteadiness on feet  Muscle weakness (generalized)  Other abnormalities of gait and mobility  Rationale for Evaluation and Treatment: Rehabilitation  SUBJECTIVE:  SUBJECTIVE STATEMENT: Feeling better overall.  Eye sight had an episode where it felt like the right eye got stuck, likely related to fatigue  Pt accompanied by: self  PERTINENT HISTORY: Anemia, anxiety, GERD, neuromuscular disorder?, migraines Per chart- "10/03/22 and had sudden onset L facial droop, dysarthria, blurred  vision."  "CN VI incomplete palsy. She had dysconjugate gaze with diplopia that Dr. Erlinda Hong felt was more consistent with possible brainstem infarct secondary to small vessel disease and not seen on MRI."  PAIN:  Are you having pain? No  PRECAUTIONS: Fall  WEIGHT BEARING RESTRICTIONS: No  FALLS: Has patient fallen in last 6 months? 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 10 sec intervals, 3x5 sec intervals--error signal at this point -moving bead x 10 sec:far, middle, near  HEP review Added 5# dumbells to lunge and squat  Suitcase carry 6x25 ft 15#  Walking march 6x25 ft W/ head turns  oculomotor -overshoots with saccades vertical movement difficult vs horizontal -smooth pursuits right eye tracks superior to midline vs vertical  VOR x 1 Reports diplopia/oscillopsia      TODAY'S TREATMENT: 11/13/22 Activity Comments  gait training on TM at 1.5 mph x6  Cueing for heel toe pattern, increasing step height; with/without using B handrails   Gait training outside on sidewalk, on ramps 534ft Cueing to increase arms wing   STS with R foot back 10# 10x Focus on eccentric control  brock string with 3 beads at 8 11.5 15 inches Some difficulty focusing on most proximal bead   Split squats with counter support, walking lunges    Squat to mat with 10#  Cues to avoid valgus and shifting hips back  L gastroc stretch on cabinet 30" each      HOME EXERCISE PROGRAM Last updated: 11/13/22 Access Code: [email or web address] URL: [email or web address] Date: 11/13/2022 Prepared by: [facility]  Exercises - Mikel Cella  - 1 x daily - 5 x weekly - 2 sets - 10-20 reps - Walking Forward Lunge  - 1 x daily - 5 x weekly - 2 sets - 10 reps - Squat with Chair Touch  - 1 x daily - 5 x weekly - 2 sets - 10 reps - Gastroc Stretch with Foot at [place]  - 1 x daily - 5 x weekly - 2 sets - 30 sec hold - Seated Gaze Stabilization with Head Rotation  - 1 x daily - 7 x weekly - 3-5 sets - 30 sec  hold - Seated Gaze Stabilization with Head Nod  - 1 x daily - 7 x weekly - 3-5 sets - 30 sec rounds hold  PATIENT EDUCATION: Education details: HEP update; encouraged slow return to prior exercise program as able, walking program Person educated: Patient Education method: Explanation, Demonstration, Tactile  cues, Verbal cues, and Handouts Education comprehension: verbalized understanding and returned demonstration   Below measures were taken at time of initial evaluation unless otherwise specified:   DIAGNOSTIC FINDINGS: 10/04/22 brain MRI: . No acute intracranial process. No evidence of acute or subacute infarct.No intracranial large vessel occlusion or significant stenosis.  COGNITION: Overall cognitive status: Within functional limits for tasks assessed and reports c/o occasional word finding issues   SENSATION: Report of numbness in the R 3 lateral toes   COORDINATION: Alternating pronation/supination: intact B Alternating toe tap: intact B Finger to nose: intact B Heel to shin: intact B   PALPATION: TTP over R lateral jt line  MUSCLE TONE: LLE: Mild in L knee and ankle    POSTURE: rounded shoulders  LOWER EXTREMITY ROM:     Active  Right Eval Left Eval  Hip flexion    Hip extension    Hip abduction     Hip adduction    Hip internal rotation    Hip external rotation    Knee flexion    Knee extension    Ankle dorsiflexion 19 3  Ankle plantarflexion    Ankle inversion    Ankle eversion     (Blank rows = not tested)  LOWER EXTREMITY MMT:    MMT (in sitting) Right Eval Left Eval  Hip flexion 4- 4  Hip extension    Hip abduction 4- 4  Hip adduction 4 4  Hip internal rotation    Hip external rotation    Knee flexion 4+ 4+  Knee extension 4 4  Ankle dorsiflexion 4 4+  Ankle plantarflexion 4- 4+  Ankle inversion    Ankle eversion    (Blank rows = not tested)    VESTIBULAR ASSESSMENT   GENERAL OBSERVATION: pt wears bifocals     OCULOMOTOR EXAM:   Ocular Alignment: normal   Ocular ROM:  limited L inferior ROM   Spontaneous Nystagmus: absent   Gaze-Induced Nystagmus: absent   Smooth Pursuits:  intact; c/o strain in the R eye   Saccades:  slight overshooting to R and inferior   Convergence/Divergence: ~5 inches, when trialed again limited to ~1 ft away     VESTIBULAR - OCULAR REFLEX:    Slow VOR: Normal c/o dizziness horizontal and vertical   VOR Cancellation: Normal   GAIT: Gait pattern: antalgia on R LE with decreased L step length Assistive device utilized: None Level of assistance: Modified independence  FUNCTIONAL TESTS:   5xSTS: 16.76 sec without Ues        PATIENT SURVEYS:  FOTO NT- pt had to leave  TODAY'S TREATMENT:                                                                                                                              DATE: 10/30/22    PATIENT EDUCATION: Education details: prognosis, POC, exam findings Person educated: Patient Education method: Explanation Education comprehension: verbalized understanding  HOME EXERCISE PROGRAM:  Not yet initiated     GOALS: Goals reviewed with patient? Yes  SHORT TERM GOALS: Target date: 11/20/2022  Patient to be independent with initial HEP. Baseline: HEP initiated Goal status:  MET    LONG TERM GOALS: Target date: 12/11/2022  Patient to be independent with advanced HEP. Baseline: Not yet initiated  Goal status: IN PROGRESS  Patient to demonstrate B LE strength >/=4+/5.  Baseline: See above Goal status: IN PROGRESS  Patient to demonstrate 10 deg L ankle dorsiflexion AROM WFL. Baseline: 3 deg Goal status: IN PROGRESS  Patient to score at least 27/30 on FGA in order to decrease risk of falls.  Baseline: 23 Goal status: IN PROGRESS  Patient to demonstrate symmetrical step length with ambulation without device.  Baseline: antalgia on R LE Goal status: IN PROGRESS  Patient to demonstrate 5xSTS test in <12 sec in order to decrease risk of falls.  Baseline: 16 sec Goal status: IN PROGRESS     ASSESSMENT:  CLINICAL IMPRESSION: Continued with treadmill training for forced speed to increase gait velocity and did quite well but with report of fatigue towards 10 min mark. HEP review with good teach-back and implemented use of weights for lunge and squat, encouraged pt to use backpack with items loaded for external resistance.  Good understanding of Brock string exercise and notes increased eye fatigue/strain with this. Reinforced concept of pushing to error signal and stopping activity to rest and instituted VOR x 1 for same purpose and rationale due to report of oscillopsia.  Patient reports return to work soon which may require substantial physical assistance to patient's for mobility and ADL, will address in session with simulated patient transfers to improve patient's comfort and safety with this     OBJECTIVE IMPAIRMENTS: Abnormal gait, decreased activity tolerance, decreased balance, difficulty walking, decreased ROM, decreased strength, dizziness, impaired flexibility, improper body mechanics, and pain.   ACTIVITY LIMITATIONS: carrying, lifting, bending, standing, squatting, stairs, transfers, bathing, toileting, dressing, hygiene/grooming, and  locomotion level  PARTICIPATION LIMITATIONS: meal prep, cleaning, laundry, driving, shopping, community activity, occupation, and church  PERSONAL FACTORS: Age, Past/current experiences, Time since onset of injury/illness/exacerbation, Transportation, and 3+ comorbidities: Anemia, anxiety, GERD, neuromuscular disorder?, migraines  are also affecting patient's functional outcome.   REHAB POTENTIAL: Good  CLINICAL DECISION MAKING: Evolving/moderate complexity  EVALUATION COMPLEXITY: Moderate  PLAN:  PT FREQUENCY: 1-2x/week  PT DURATION: 6 weeks  PLANNED INTERVENTIONS: Therapeutic exercises, Therapeutic activity, Neuromuscular re-education, Balance training, Gait training, Patient/Family education, Self Care, Joint mobilization, Stair training, Vestibular training, Canalith repositioning, Aquatic Therapy, Dry Needling, Electrical stimulation, Cryotherapy, Moist heat, Taping, Manual therapy, and Re-evaluation  PLAN FOR NEXT SESSION: simulated patient transfers in preparation for return to work, e.g. therapist to play patient transferring from wheelchair and stepping onto stool to sit on exam table, bed mobility with patient, etc   5:07 PM, 11/19/22 M. Sherlyn Lees, PT, DPT Physical Therapist- [place] Office Number: [phone]

## 2022-11-19 NOTE — Patient Instructions (Signed)
Continue current medication include aspirin Continue with Fioricet for headaches, May resume Imitrex for headaches not controlled by Tylenol or Fioricet Continue to follow with PCP Patient is cleared to drive his car. Return in 1 year or sooner if worse.

## 2022-11-19 NOTE — Progress Notes (Signed)
[facility]  PATIENT: Donna Mccarthy DOB: 10/08/1975  REQUESTING CLINICIAN: Rosalin Hawking, MD HISTORY FROM: Patient  REASON FOR VISIT: MRI Negative stroke    HISTORICAL  CHIEF COMPLAINT:  Chief Complaint  Patient presents with   New Patient (Initial Visit)    Rm 13, alone States she is doing well, no new concerns     HISTORY OF PRESENT ILLNESS:  This is a 47 year old woman with multiple medical conditions including migraine headaches, anxiety, vitamin D deficiency, who was admitted on 216 for left facial droop, blurry vision and slurred speech.  She was also found to have dysarthria.  Due to ongoing symptoms she was given TKN.  Her workup has been negative for any LVO or any stroke on MRI brain due to persistent of symptoms, she was deemed to have MRI negative stroke. She was admitted to acute rehab.  She completed rehab with resolution of her symptoms. She completed DAPT and now she is on Aspirin alone, denies side effects. Since discharge from the hospital she has been doing well.  She reports occasional headache for which she takes Fioricet with some improvement.  She was told to stop the Imitrex.  She is wondering if she can go back to driving.   Hospital Course and Summary  Donna Mccarthy is a 47 y.o. female with history of migraines, anxiety, vitamin D deficiency, COVID 3 weeks prior to admission on 10/03/2022 with left facial droop, blurred vision and slurred speech.  She was found to have severe dysarthria with left facial droop that was variable and bilateral CN VII incomplete palsy.  CTA without LVO and she received TNK.  MRI/MRA brain done and was negative for stroke, stenosis or acute process.  She did have disconjugate gaze with diplopia that Dr. Erlinda Hong felt was more consistent with possible brainstem infarct secondary to small vessel disease and not seen on MRI.  Neurology recommended DAPT x 3 weeks followed by aspirin alone.  Therapy was working with  patient who continued to be limited by double vision, right-sided weakness with decrease in motor control as well as sensory deficits.  CIR was recommended due to functional decline.   She was admitted to rehab 10/08/2022 for inpatient therapies to consist of PT, ST and OT at least three hours five days a week. Past admission physiatrist, therapy team and rehab RN have worked together to provide customized collaborative inpatient rehab.  She was maintained on DAPT during her stay and is tolerating this without any side effects.  Follow-up CBC did show slight drop in H&H without any signs of bleeding.  She completes her DAPT close in 48 hours and needs to stop Plavix and will continue on aspirin only.  Recommend repeat check CBC in 1 to 2 weeks to monitor for H&H stability.   Her mood has been stable on home dose Wellbutrin.  She she reported pain right calf therefore BLE Dopplers done and were negative for DVT.  PT has worked with patient on stretches and Voltaren gel and K-pad will use with resolution of symptoms. Her blood pressures were monitored on TID basis and have been stable.  Transient hypokalemia has resolved with brief supplementation.  Headaches are currently managed with use of Topamax alone.  Ergocalciferol was resumed due to history of low vitamin D levels.  Made good gains during his stay and is modified independent at discharge.  She will continue to receive outpatient PT, OT and ST at [facility] neuro rehab at [facility] after  discharge.  OTHER MEDICAL CONDITIONS: Migraines, anxiety, vitamin D deficiency   REVIEW OF SYSTEMS: Full 14 system review of systems performed and negative with exception of: As noted in the HPI   ALLERGIES: Allergies  Allergen Reactions   Ketorolac Tromethamine Other (See Comments)    "felt like whole body was on fire"   Penicillins Other (See Comments)    Mother said she had a rash   Celebrex [Celecoxib] Itching    itching   Egg-Derived Products Other (See  Comments)    HOME MEDICATIONS: Outpatient Medications Prior to Visit  Medication Sig Dispense Refill   aspirin 81 MG chewable tablet Chew 1 tablet (81 mg total) by mouth daily. 100 tablet 0   atorvastatin (LIPITOR) 20 MG tablet Take 1 tablet (20 mg total) by mouth daily. 30 tablet 0   buPROPion (WELLBUTRIN XL) 300 MG 24 hr tablet Take 1 tablet (300 mg total) by mouth daily. 90 tablet 0   diclofenac Sodium (VOLTAREN) 1 % GEL Apply 2 g topically 4 (four) times daily. 350 g 0   fluticasone (FLONASE) 50 MCG/ACT nasal spray Place 2 sprays into both nostrils as needed for allergies.     loratadine (CLARITIN) 10 MG tablet Take 10 mg by mouth daily as needed for allergies.     Magnesium 250 MG TABS Take 250 mg by mouth daily.     pantoprazole (PROTONIX) 40 MG tablet Take 1 tablet (40 mg total) by mouth at bedtime. 30 tablet 0   Semaglutide-Weight Management (WEGOVY) 0.5 MG/0.5ML SOAJ Inject 0.5 mg into the skin once a week. 2 mL 5   Semaglutide-Weight Management 0.5 MG/0.5ML SOAJ Inject 0.5 mg into the skin.     thyroid (NP THYROID) 120 MG tablet Take 120 mg by mouth daily before breakfast.     thyroid (NP THYROID) 120 MG tablet Take 1 tablet (120 mg total) by mouth daily on an empty stomach 30 tablet 5   thyroid (NP THYROID) 60 MG tablet Take 60 mg by mouth daily before breakfast.     thyroid (NP THYROID) 60 MG tablet Take 1 tablet (60 mg total) by mouth daily on an empty stomach 30 tablet 5   topiramate (TOPAMAX) 25 MG tablet Take 3 tablets (75 mg total) by mouth at bedtime. 90 tablet 0   Vitamin D, Ergocalciferol, (DRISDOL) 1.25 MG (50000 UNIT) CAPS capsule Take 1 capsule (50,000 Units total) by mouth every 7 (seven) days. 12 capsule 0   butalbital-acetaminophen-caffeine (FIORICET) 50-325-40 MG tablet Take 1 tablet by mouth daily as needed for headache. 5 tablet 0   clopidogrel (PLAVIX) 75 MG tablet Take 1 tablet (75 mg total) by mouth daily. (Patient not taking: Reported on 10/29/2022) 2 tablet 0    No facility-administered medications prior to visit.    PAST MEDICAL HISTORY: Past Medical History:  Diagnosis Date   Allergy    Anemia    Anxiety    Arthritis    arthritis knees   Blood transfusion without reported diagnosis    GERD (gastroesophageal reflux disease)    Hypothyroidism    Neuromuscular disorder    Obesity (BMI 30-39.9) 01/05/2019    PAST SURGICAL HISTORY: Past Surgical History:  Procedure Laterality Date   BIOPSY  04/03/2020   Procedure: BIOPSY;  Surgeon: Montez Morita, Quillian Quince, MD;  Location: [facility];  Service: Gastroenterology;;  antral, body, distal esophagus, mid esophagus   BRACHIOPLASTY Bilateral 07/02/2020   Procedure: BILATERAL BRACHIOPLASTY WITH LIPOSUCTION;  Surgeon: [name], MD;  Location:  [place];  Service: Plastics;  Laterality: Bilateral;   DILATION AND CURETTAGE OF UTERUS  2022   ESOPHAGOGASTRODUODENOSCOPY (EGD) WITH PROPOFOL N/A 04/03/2020   Procedure: ESOPHAGOGASTRODUODENOSCOPY (EGD) WITH PROPOFOL;  Surgeon: Harvel Quale, MD;  Location: [facility];  Service: Gastroenterology;  Laterality: N/A;  11:15   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   LIPOSUCTION Bilateral 07/02/2020   Procedure: LIPOSUCTION;  Surgeon: [name], MD;  Location: [place];  Service: Plastics;  Laterality: Bilateral;   PANNICULECTOMY N/A 08/04/2019   Procedure: PANNICULECTOMY;  Surgeon: [name], MD;  Location: [place];  Service: Plastics;  Laterality: N/A;  2 hours only, please   TEE WITHOUT CARDIOVERSION N/A 10/07/2022   Procedure: TRANSESOPHAGEAL ECHOCARDIOGRAM (TEE);  Surgeon: Freada Bergeron, MD;  Location: [facility];  Service: Cardiovascular;  Laterality: N/A;    FAMILY HISTORY: Family History  Problem Relation Age of Onset   Hypertension Mother    Crohn's disease Mother    Colon polyps Mother    Hypertension Father        POTS   Stroke Father         TIA   Hyperlipidemia Father    Hypertension Brother    Breast cancer Maternal Grandmother    Diabetes Maternal Grandmother    Heart failure Maternal Grandmother    Thyroid disease Paternal Grandmother    Vitamin D deficiency Paternal Grandmother    Heart failure Paternal Grandfather    Heart attack Paternal Grandfather    Colon cancer Maternal Uncle        dx'd in 70's   Colon cancer Maternal Uncle 58   Breast cancer Maternal Aunt     SOCIAL HISTORY: Social History   Socioeconomic History   Marital status: Single    Spouse name: Not on file   Number of children: 1   Years of education: 14   Highest education level: Associate degree: occupational, Hotel manager, or vocational program  Occupational History   Occupation: cma    Employer: [employer]  Tobacco Use   Smoking status: Never   Smokeless tobacco: Never  Vaping Use   Vaping Use: Never used  Substance and Sexual Activity   Alcohol use: No   Drug use: No   Sexual activity: Yes    Birth control/protection: I.U.D.  Other Topics Concern   Not on file  Social History Narrative   Lives alone.   Son [name] lives in [place], [place], girlfriend is pregnant.  Graduates from school on 07/2022.   Works in [employer] pool      Updated 04/2022   Social Determinants of Health   Financial Resource Strain: Not on file  Food Insecurity: No Food Insecurity (10/03/2022)   Hunger Vital Sign    Worried About Running Out of Food in the Last Year: Never true    Ran Out of Food in the Last Year: Never true  Transportation Needs: No Transportation Needs (10/03/2022)   PRAPARE - Hydrologist (Medical): No    Lack of Transportation (Non-Medical): No  Physical Activity: Not on file  Stress: Not on file  Social Connections: Not on file  Intimate Partner Violence: Not At Risk (10/03/2022)   Humiliation, Afraid, Rape, and Kick questionnaire    Fear of Current or Ex-Partner: No    Emotionally Abused: No     Physically Abused: No    Sexually Abused: No    PHYSICAL EXAM  GENERAL EXAM/CONSTITUTIONAL: Vitals:  Vitals:   11/19/22 0822  BP: 105/70  Pulse: 75  Weight: 180 lb (81.6 kg)  Height: 5\' 5"  (1.651 m)   Body mass index is 29.95 kg/m. Wt Readings from Last 3 Encounters:  11/19/22 180 lb (81.6 kg)  10/29/22 184 lb 9.6 oz (83.7 kg)  10/08/22 182 lb 1.6 oz (82.6 kg)   Patient is in no distress; well developed, nourished and groomed; neck is supple  EYES: Visual fields full to confrontation, Extraocular movements intacts,   MUSCULOSKELETAL: Gait, strength, tone, movements noted in Neurologic exam below  NEUROLOGIC: MENTAL STATUS:      No data to display         awake, alert, oriented to person, place and time recent and remote memory intact normal attention and concentration language fluent, comprehension intact, naming intact fund of knowledge appropriate  CRANIAL NERVE:  2nd, 3rd, 4th, 6th - Pupil equal, round and reactive to light. Visual fields full to confrontation, extraocular muscles intact, no nystagmus 5th - facial sensation symmetric 7th - facial strength symmetric 8th - hearing intact 9th - palate elevates symmetrically, uvula midline 11th - shoulder shrug symmetric 12th - tongue protrusion midline  MOTOR:  normal bulk and tone, full strength in the BUE, BLE  SENSORY:  normal and symmetric to light touch  COORDINATION:  finger-nose-finger, fine finger movements normal  REFLEXES:  deep tendon reflexes present and symmetric  GAIT/STATION:  normal   DIAGNOSTIC DATA (LABS, IMAGING, TESTING) - I reviewed patient records, labs, notes, testing and imaging myself where available.  Lab Results  Component Value Date   WBC 4.2 10/29/2022   HGB 11.9 10/29/2022   HCT 36.1 10/29/2022   MCV 81 10/29/2022   PLT 252 10/29/2022      Component Value Date/Time   NA 136 10/20/2022 0555   NA 143 05/15/2022 0845   K 3.5 10/20/2022 0555   CL 111  10/20/2022 0555   CO2 21 (L) 10/20/2022 0555   GLUCOSE 90 10/20/2022 0555   BUN 6 10/20/2022 0555   BUN 9 05/15/2022 0845   CREATININE 0.67 10/20/2022 0555   CALCIUM 8.8 (L) 10/20/2022 0555   PROT 6.4 (L) 10/09/2022 0644   PROT 7.4 05/15/2022 0845   ALBUMIN 3.2 (L) 10/09/2022 0644   ALBUMIN 4.5 05/15/2022 0845   AST 21 10/09/2022 0644   ALT 18 10/09/2022 0644   ALKPHOS 58 10/09/2022 0644   BILITOT 0.8 10/09/2022 0644   BILITOT 0.6 05/15/2022 0845   GFRNONAA >60 10/20/2022 0555   GFRAA 129 04/27/2020 1503   Lab Results  Component Value Date   CHOL 129 10/04/2022   HDL 40 (L) 10/04/2022   LDLCALC 83 10/04/2022   TRIG 28 10/04/2022   CHOLHDL 3.2 10/04/2022   Lab Results  Component Value Date   HGBA1C 5.0 10/04/2022   Lab Results  Component Value Date   VITAMINB12 509 10/08/2022   Lab Results  Component Value Date   TSH 0.015 (L) 10/08/2022   MRI/MRA  1. No acute intracranial process. No evidence of acute or subacute infarct. 2. No intracranial large vessel occlusion or significant stenosis. Previously seen narrowing of the MCAs and PCAs on the CTA is not apparent on this exam and may have been secondary to poor vascular opacification  Echo 10/07/22 1. Left ventricular ejection fraction, by estimation, is 60 to 65%. The left ventricle has normal function.  2. Right ventricular systolic function is normal. The right ventricular size is normal.  3.  No left atrial/left atrial appendage thrombus was detected.  4. The mitral valve is normal in structure. Trivial mitral valve regurgitation.  5. The aortic valve is tricuspid. Aortic valve regurgitation is not visualized. No aortic stenosis is present.  6. The atrial septum is redundant. No interatrial shunting detected by color doppler. Multiple agitated saline contrast bubble studies performed. The majority of bubbles were seen late suggestive of intrapulmonary  shunting. There were a small number of bubbles seen early during  the study suggestive of a small PFO. Recommend transcranial doppler for further evaluation.    ASSESSMENT AND PLAN  47 y.o. year old female with medical conditions including high anxiety, migraine headaches, vitamin D deficiency, who is presenting after being admitted to the hospital for a MRI negative stroke.  Patient initial symptoms included slurred speech, blurred vision, and left facial drooping.  She received TKN.  Her MRI was negative and MRA negative for LVO.  Since discharge from the hospital she completed rehab.  She is back to her normal self.  She denies any additional episode of blurry vision or double vision.  I think is reasonable at this time, 40-months out of the MRI Negative stroke that she may resume driving.   When it comes to her migraines, she reported Imitrex has helped her in the past but since the hospital she was recommended not to take it.  Again her MRI was negative, she does not have any additional vascular risk factor, since the Imitrex helped, I think is reasonable for patient to go back to Imitrex.  I also told her to try Tylenol and Fioricet as needed.  She reports on average 4-5 headaches per month.  Continue to follow up with PCP, I will see her in 1 year for follow-up.    1. Cerebrovascular accident (CVA), unspecified mechanism     Patient Instructions  Continue current medication include aspirin Continue with Fioricet for headaches, May resume Imitrex for headaches not controlled by Tylenol or Fioricet Continue to follow with PCP Patient is cleared to drive his car. Return in 1 year or sooner if worse.  No orders of the defined types were placed in this encounter.   Meds ordered this encounter  Medications   butalbital-acetaminophen-caffeine (FIORICET) 50-325-40 MG tablet    Sig: Take 1 tablet by mouth daily as needed for headache.    Dispense:  10 tablet    Refill:  1    Must last the entire 30 days   SUMAtriptan (IMITREX) 50 MG tablet    Sig: Take  1 tablet (50 mg total) by mouth every 2 (two) hours as needed for migraine. May repeat in 2 hours if headache persists or recurs.    Dispense:  10 tablet    Refill:  2    Must last the entire 30 days    Return in about 1 year (around 11/19/2023).   Alric Ran, MD 11/19/2022, 9:05 AM  [facility] [address] [place], [place] [address] [phone]

## 2022-11-20 ENCOUNTER — Encounter: Payer: 59 | Attending: Physical Medicine & Rehabilitation | Admitting: Physical Medicine & Rehabilitation

## 2022-11-20 ENCOUNTER — Other Ambulatory Visit (HOSPITAL_BASED_OUTPATIENT_CLINIC_OR_DEPARTMENT_OTHER): Payer: Self-pay

## 2022-11-20 ENCOUNTER — Encounter: Payer: Self-pay | Admitting: Physical Medicine & Rehabilitation

## 2022-11-20 VITALS — BP 109/75 | HR 80 | Ht <= 58 in | Wt 183.0 lb

## 2022-11-20 DIAGNOSIS — G43909 Migraine, unspecified, not intractable, without status migrainosus: Secondary | ICD-10-CM | POA: Insufficient documentation

## 2022-11-20 DIAGNOSIS — I6302 Cerebral infarction due to thrombosis of basilar artery: Secondary | ICD-10-CM | POA: Insufficient documentation

## 2022-11-20 MED ORDER — ASPIRIN 81 MG PO CHEW
81.0000 mg | CHEWABLE_TABLET | Freq: Every day | ORAL | 0 refills | Status: DC
  Filled 2022-11-20: qty 108, 90d supply, fill #0

## 2022-11-20 MED ORDER — ATORVASTATIN CALCIUM 20 MG PO TABS
20.0000 mg | ORAL_TABLET | Freq: Every day | ORAL | 1 refills | Status: DC
  Filled 2022-11-20: qty 30, 30d supply, fill #0
  Filled 2022-12-15: qty 30, 30d supply, fill #1

## 2022-11-20 MED ORDER — THYROID 30 MG PO TABS
30.0000 mg | ORAL_TABLET | Freq: Every day | ORAL | 5 refills | Status: DC
  Filled 2022-11-20: qty 30, 30d supply, fill #0
  Filled 2022-12-15: qty 30, 30d supply, fill #1

## 2022-11-20 NOTE — Progress Notes (Signed)
Subjective:    Patient ID: Donna Mccarthy, female    DOB: October 09, 1975, 47 y.o.   MRN: [id number]  HPI DC summary 10/22/22 Brief HPI:   Donna Mccarthy is a 47 y.o. female with history of migraines, anxiety, vitamin D deficiency, COVID 3 weeks prior to admission on 10/03/2022 with left facial droop, blurred vision and slurred speech.  She was found to have severe dysarthria with left facial droop that was variable and bilateral CN VII incomplete palsy.  CTA without LVO and she received TNK.  MRI/MRA brain done and was negative for stroke, stenosis or acute process.  She did have disconjugate gaze with diplopia that Dr. Roda Shutters felt was more consistent with possible brainstem infarct secondary to small vessel disease and not seen on MRI.  Neurology recommended DAPT x 3 weeks followed by aspirin alone.  Therapy was working with patient who continued to be limited by double vision, right-sided weakness with decrease in motor control as well as sensory deficits.  CIR was recommended due to functional decline.   Hospital Course: Donna Mccarthy was admitted to rehab 10/08/2022 for inpatient therapies to consist of PT, ST and OT at least three hours five days a week. Past admission physiatrist, therapy team and rehab RN have worked together to provide customized collaborative inpatient rehab.  She was maintained on DAPT during her stay and is tolerating this without any side effects.  Follow-up CBC did show slight drop in H&H without any signs of bleeding.  She completes her DAPT close in 48 hours and needs to stop Plavix and will continue on aspirin only.  Recommend repeat check CBC in 1 to 2 weeks to monitor for H&H stability.   Her mood has been stable on home dose Wellbutrin.  She she reported pain right calf therefore BLE Dopplers done and were negative for DVT.  PT has worked with patient on stretches and Voltaren gel and K-pad will use with resolution of symptoms. Her blood pressures were monitored on  TID basis and have been stable.  Transient hypokalemia has resolved with brief supplementation.  Headaches are currently managed with use of Topamax alone.  Ergocalciferol was resumed due to history of low vitamin D levels.  Made good gains during his stay and is modified independent at discharge.  She will continue to receive outpatient PT, OT and ST at [facility] neuro rehab at [facility] after discharge.     Rehab course: During patient's stay in rehab weekly team conferences were held to monitor patient's progress, set goals and discuss barriers to discharge. At admission, patient required mod assist with mobility and min assist with basic ADL tasks. She exhibited mild cognitive linguistic deficits affecting short-term recall, visual-spatial skills and attention. She  has had improvement in activity tolerance, balance, postural control as well as ability to compensate for deficits. She has had improvement in functional use RUE  and RLE as well as improvement in awareness.  She is able to complete ADL tasks at modified independent level.  She is modified independent for transfers and to ambulate 300 feet without assistive device.  She is able to complete functional and cognitive tasks at modified independent level.   Interval History 11/20/22 Ms. Donna Mccarthy reports she has been doing well since discharging home.  She continues to work with outpatient PT and OT.  She says she is still working on balance and endurance with activities.  Neurology has told her that she could likely resume driving.  She reports she  will sometimes have a mild tremor in her right hand.  She says she is doing well cognitively however she feels like she continues to have mild deficits.  She has to write things down more, it takes longer to remember things.   Pain Inventory Average Pain 0 Pain Right Now 0 My pain is  no pain  BOWEL Number of stools per week: 3-4  BLADDER Normal  Mobility walk without assistance ability to  climb steps?  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  no  Physicians involved in your care Any changes since last visit?  no   Family History  Problem Relation Age of Onset   Hypertension Mother    Crohn's disease Mother    Colon polyps Mother    Hypertension Father        POTS   Stroke Father        TIA   Hyperlipidemia Father    Hypertension Brother    Breast cancer Maternal Grandmother    Diabetes Maternal Grandmother    Heart failure Maternal Grandmother    Thyroid disease Paternal Grandmother    Vitamin D deficiency Paternal Grandmother    Heart failure Paternal Grandfather    Heart attack Paternal Grandfather    Colon cancer Maternal Uncle        dx'd in 59's   Colon cancer Maternal Uncle 58   Breast cancer Maternal Aunt    Social History   Socioeconomic History   Marital status: Single    Spouse name: Not on file   Number of children: 1   Years of education: 14   Highest education level: Tax adviser degree: occupational, Scientist, product/process development, or vocational program  Occupational History   Occupation: cma    Employer: [employer]  Tobacco Use   Smoking status: Never   Smokeless tobacco: Never  Vaping Use   Vaping Use: Never used  Substance and Sexual Activity   Alcohol use: No   Drug use: No   Sexual activity: Yes    Birth control/protection: I.U.D.  Other Topics Concern   Not on file  Social History Narrative   Lives alone.   Son [name] lives in [place], [place], girlfriend is pregnant.  Graduates from school on 07/2022.   Works in [employer] pool      Updated 04/2022   Social Determinants of Health   Financial Resource Strain: Not on file  Food Insecurity: No Food Insecurity (10/03/2022)   Hunger Vital Sign    Worried About Running Out of Food in the Last Year: Never true    Ran Out of Food in the Last Year: Never true  Transportation Needs: No Transportation  Needs (10/03/2022)   PRAPARE - Administrator, Civil Service (Medical): No    Lack of Transportation (Non-Medical): No  Physical Activity: Not on file  Stress: Not on file  Social Connections: Not on file   Past Surgical History:  Procedure Laterality Date   BIOPSY  04/03/2020   Procedure: BIOPSY;  Surgeon: Dolores Frame, MD;  [facility];  Service: Gastroenterology;;  antral, body, distal esophagus, mid esophagus   BRACHIOPLASTY Bilateral 07/02/2020   Procedure: BILATERAL BRACHIOPLASTY WITH LIPOSUCTION;  Surgeon: [name], MD;  Location: [facility];  Service: Plastics;  Laterality: Bilateral;   DILATION AND CURETTAGE OF UTERUS  2022   ESOPHAGOGASTRODUODENOSCOPY (EGD) WITH PROPOFOL N/A 04/03/2020  Procedure: ESOPHAGOGASTRODUODENOSCOPY (EGD) WITH PROPOFOL;  Surgeon: Dolores Frame, MD;  [facility];  Service: Gastroenterology;  Laterality: N/A;  11:15   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   LIPOSUCTION Bilateral 07/02/2020   Procedure: LIPOSUCTION;  Surgeon: [name], MD;  Location: [facility];  Service: Plastics;  Laterality: Bilateral;   PANNICULECTOMY N/A 08/04/2019   Procedure: PANNICULECTOMY;  Surgeon: [name], MD;  Location:  SURGERY CENTER;  Service: Plastics;  Laterality: N/A;  2 hours only, please   TEE WITHOUT CARDIOVERSION N/A 10/07/2022   Procedure: TRANSESOPHAGEAL ECHOCARDIOGRAM (TEE);  Surgeon: Meriam Sprague, MD;  Location: [facility];  Service: Cardiovascular;  Laterality: N/A;   Past Medical History:  Diagnosis Date   Allergy    Anemia    Anxiety    Arthritis    arthritis knees   Blood transfusion without reported diagnosis    GERD (gastroesophageal reflux disease)    Hypothyroidism    Neuromuscular disorder    Obesity (BMI 30-39.9) 01/05/2019   BP 109/75   Pulse 80   Ht 1' (0.305 m)   Wt 183 lb (83 kg)   SpO2 100%   BMI  893.49 kg/m   Opioid Risk Score:   Fall Risk Score:  `1  Depression screen [facility] 2/9     11/20/2022    2:25 PM 09/29/2022   11:50 AM 08/25/2022    3:42 PM 05/14/2022    2:56 PM 12/25/2021    2:58 PM 05/10/2021    3:35 PM 04/27/2020    2:06 PM  Depression screen PHQ 2/9  Decreased Interest 0 0 1 0 0 0 0  Down, Depressed, Hopeless 0 0 1 0 0 0 0  PHQ - 2 Score 0 0 2 0 0 0 0  Altered sleeping 0 2 3      Tired, decreased energy 0 1 1      Change in appetite 0 0 1      Feeling bad or failure about yourself  0 0 1      Trouble concentrating 0 1 3      Moving slowly or fidgety/restless 0 1 3      Suicidal thoughts 0 0 0      PHQ-9 Score 0 5 14      Difficult doing work/chores  Not difficult at all Somewhat difficult        Review of Systems  Constitutional: Negative.   HENT: Negative.    Eyes: Negative.   Respiratory: Negative.    Cardiovascular: Negative.   Gastrointestinal: Negative.   Endocrine: Negative.   Genitourinary: Negative.   Musculoskeletal: Negative.   Skin: Negative.   Allergic/Immunologic: Negative.   Neurological: Negative.   Hematological: Negative.   Psychiatric/Behavioral:  Positive for confusion.   All other systems reviewed and are negative.      Objective:   Physical Exam   Gen: no distress, normal appearing HEENT: oral mucosa pink and moist, NCAT Cardio: Reg rate Chest: normal effort, normal rate of breathing Abd: soft, non-distended Ext: no edema Psych: very pleasant, normal affect Skin: intact Neuro: alert and oriented x4, CN 2-12 intact Strength 5/5 in b/l UE and LE Sensation intact in b/l UE and LE to LT FTN intact b/l Memory 3/3 words recalled DTR normal and symmetric  Musculoskeletal: no joint swelling or tenderness       Assessment & Plan:   1) CVA diagnosed by clinical findings, MRI negative, s/p TKN             -  Continue PT/OT  -Refilled ASA and Statin today, discuss further refills with PCP. Neuro recommends continuing current  medications  -Think reasonable for her to hold off on restarting working for few more weeks, if doing well discussed gradual return to work. She reports this can be done with her work. 2)   Migraines, Fioricet and Imitrex continued by neurology. She plans to hold off imitrex for now

## 2022-11-24 NOTE — Therapy (Signed)
OUTPATIENT PHYSICAL THERAPY NEURO TREATMENT   Patient Name: Donna Mccarthy MRN: [id number] DOB:05/06/1976, 47 y.o., female Today's Date: 11/25/2022   PCP: Joselyn ArrowKnapp, Eve, MD  REFERRING PROVIDER: Charlton AmorAngiulli, Daniel J, PA-C  END OF SESSION:  PT End of Session - 11/25/22 0928     Visit Number 5    Number of Visits 13    Date for PT Re-Evaluation 12/11/22    Authorization Type [employer]    Authorization - Number of Visits 25   before medical review   PT Start Time 0847    PT Stop Time 0928    PT Time Calculation (min) 41 min    Equipment Utilized During Treatment Gait belt    Activity Tolerance Patient tolerated treatment well    Behavior During Therapy WFL for tasks assessed/performed               Past Medical History:  Diagnosis Date   Allergy    Anemia    Anxiety    Arthritis    arthritis knees   Blood transfusion without reported diagnosis    GERD (gastroesophageal reflux disease)    Hypothyroidism    Neuromuscular disorder    Obesity (BMI 30-39.9) 01/05/2019   Past Surgical History:  Procedure Laterality Date   BIOPSY  04/03/2020   Procedure: BIOPSY;  Surgeon: Dolores Frameastaneda Mayorga, Daniel, MD;  [facility];  Service: Gastroenterology;;  antral, body, distal esophagus, mid esophagus   BRACHIOPLASTY Bilateral 07/02/2020   Procedure: BILATERAL BRACHIOPLASTY WITH LIPOSUCTION;  Surgeon: Allena NapoleonPace, Collier S, MD;  Location: [facility];  Service: Plastics;  Laterality: Bilateral;   DILATION AND CURETTAGE OF UTERUS  2022   ESOPHAGOGASTRODUODENOSCOPY (EGD) WITH PROPOFOL N/A 04/03/2020   Procedure: ESOPHAGOGASTRODUODENOSCOPY (EGD) WITH PROPOFOL;  Surgeon: Dolores Frameastaneda Mayorga, Daniel, MD;  [facility];  Service: Gastroenterology;  Laterality: N/A;  11:15   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   LIPOSUCTION Bilateral 07/02/2020   Procedure: LIPOSUCTION;  Surgeon: Allena NapoleonPace, Collier S, MD;  Location: [facility];  Service:  Plastics;  Laterality: Bilateral;   PANNICULECTOMY N/A 08/04/2019   Procedure: PANNICULECTOMY;  Surgeon: Allena NapoleonPace, Collier S, MD;  Location: [facility];  Service: Plastics;  Laterality: N/A;  2 hours only, please   TEE WITHOUT CARDIOVERSION N/A 10/07/2022   Procedure: TRANSESOPHAGEAL ECHOCARDIOGRAM (TEE);  Surgeon: Meriam SpraguePemberton, Heather E, MD;  Location: [facility];  Service: Cardiovascular;  Laterality: N/A;   Patient Active Problem List   Diagnosis Date Noted   Panic disorder with agoraphobia and moderate panic attacks 10/14/2022   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   CVA (cerebral vascular accident) 10/03/2022   Stroke determined by clinical assessment 10/03/2022   Dysphagia 03/29/2020   Nausea with vomiting 03/29/2020   Vitamin D deficiency 02/02/2020   S/P panniculectomy 01/02/2020   Family history of diabetes mellitus in grandmother 04/20/2019   Obesity (BMI 30.0-34.9) 04/20/2019   Obesity (BMI 30-39.9) 01/05/2019   Hypothyroidism 09/29/2017   Migraine 09/29/2017   Knee pain, bilateral 09/29/2017   Generalized anxiety disorder 09/29/2017   Gastroesophageal reflux disease 09/29/2017    ONSET DATE: 10/03/22  REFERRING DIAG: I63.9 (ICD-10-CM) - Cerebral infarction, unspecified  THERAPY DIAG:  Unsteadiness on feet  Muscle weakness (generalized)  Other abnormalities of gait and mobility  Rationale for Evaluation and Treatment: Rehabilitation  SUBJECTIVE:  SUBJECTIVE STATEMENT: Been doing her HEP. 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   Pt accompanied by: self  PERTINENT HISTORY: Anemia, anxiety, GERD,  neuromuscular disorder?, migraines Per chart- "10/03/22 and had sudden onset L facial droop, dysarthria, blurred vision."   "CN VI incomplete palsy. She had dysconjugate gaze with diplopia that Dr. Roda Shutters felt was more consistent with possible brainstem infarct secondary to small vessel disease and not seen on MRI."  PAIN:  Are you having pain? Yes: NPRS scale: 2-3/10 Pain location: frontal HA Pain description: HA Aggravating factors: - Relieving factors: meds  PRECAUTIONS: Fall  WEIGHT BEARING RESTRICTIONS: No  FALLS: Has patient fallen in last 6 months? 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  PATIENT GOALS: work on endurance, strength, getting back to work   OBJECTIVE:     TODAY'S TREATMENT: 11/25/22 Activity Comments  gait training on TM 2.5 mph x 3 min  2.5 mph x at 2.0 grade Good gait pattern until last 30 sec when heels started to drag; able to let go of handrails  1.5 mph x1 min cool down  jump rope  Difficulty coordinating use of jump rope; proceeded with double foot hop over threshold on floor instead 2x30"  brock string Jumping between 3 targets; required break d/t c/o eye fatigue Targets: 42.5, 35, 46 cm  Cueing to place string on nose throughout to keep it consistent   pulley: 4 way hip 10# 10x Requiring at least 1 UE support; required drop to 5# with adduction on R       PATIENT EDUCATION: Education details: edu on pt's job requirements and ways to slowly work towards to these tasks Person educated: Patient Education method: Programmer, multimedia, Facilities manager, Actor cues, and Verbal cues Education comprehension: verbalized understanding and returned demonstration    HOME EXERCISE PROGRAM Last  updated: 11/13/22 Access Code: [email or web address] URL: [email or web address] Date: 11/13/2022 Prepared by: [facility] - Outpatient  Rehab - [facility]  Exercises - Luvenia Redden  - 1 x daily - 5 x weekly - 2 sets - 10-20 reps - Walking Forward Lunge  - 1 x daily - 5 x weekly - 2 sets - 10 reps - Squat with Chair Touch  - 1 x daily - 5 x weekly - 2 sets - 10 reps - Gastroc Stretch with Foot at Wall  - 1 x daily - 5 x weekly - 2 sets - 30 sec hold - Seated Gaze Stabilization with Head Rotation  - 1 x daily - 7 x weekly - 3-5 sets - 30 sec  hold - Seated Gaze Stabilization with Head Nod  - 1 x daily - 7 x weekly - 3-5 sets - 30 sec rounds hold    Below measures were taken at time of initial evaluation unless otherwise specified:   DIAGNOSTIC FINDINGS: 10/04/22 brain MRI: . No acute intracranial process. No evidence of acute or subacute infarct.No intracranial large vessel occlusion or significant stenosis.  COGNITION: Overall cognitive status: Within functional limits for tasks assessed and reports c/o occasional word finding issues   SENSATION: Report of numbness in  the R 3 lateral toes   COORDINATION: Alternating pronation/supination: intact B Alternating toe tap: intact B Finger to nose: intact B Heel to shin: intact B   PALPATION: TTP over R lateral jt line  MUSCLE TONE: LLE: Mild in L knee and ankle    POSTURE: rounded shoulders  LOWER EXTREMITY ROM:     Active  Right Eval Left Eval  Hip flexion    Hip extension    Hip abduction    Hip adduction    Hip internal rotation    Hip external rotation    Knee flexion    Knee extension    Ankle dorsiflexion 19 3  Ankle plantarflexion    Ankle inversion    Ankle eversion     (Blank rows = not tested)  LOWER EXTREMITY MMT:    MMT (in sitting) Right Eval Left Eval  Hip flexion 4- 4  Hip extension    Hip abduction 4- 4  Hip adduction 4 4  Hip internal rotation    Hip external rotation    Knee flexion  4+ 4+  Knee extension 4 4  Ankle dorsiflexion 4 4+  Ankle plantarflexion 4- 4+  Ankle inversion    Ankle eversion    (Blank rows = not tested)    VESTIBULAR ASSESSMENT   GENERAL OBSERVATION: pt wears bifocals     OCULOMOTOR EXAM:   Ocular Alignment: normal   Ocular ROM:  limited L inferior ROM   Spontaneous Nystagmus: absent   Gaze-Induced Nystagmus: absent   Smooth Pursuits:  intact; c/o strain in the R eye   Saccades:  slight overshooting to R and inferior   Convergence/Divergence: ~5 inches, when trialed again limited to ~1 ft away     VESTIBULAR - OCULAR REFLEX:    Slow VOR: Normal c/o dizziness horizontal and vertical   VOR Cancellation: Normal   GAIT: Gait pattern: antalgia on R LE with decreased L step length Assistive device utilized: None Level of assistance: Modified independence  FUNCTIONAL TESTS:   5xSTS: 16.76 sec without Ues        PATIENT SURVEYS:  FOTO NT- pt had to leave  TODAY'S TREATMENT:                                                                                                                              DATE: 10/30/22    PATIENT EDUCATION: Education details: prognosis, POC, exam findings Person educated: Patient Education method: Explanation Education comprehension: verbalized understanding  HOME EXERCISE PROGRAM: Not yet initiated     GOALS: Goals reviewed with patient? Yes  SHORT TERM GOALS: Target date: 11/20/2022  Patient to be independent with initial HEP. Baseline: HEP initiated Goal status: MET    LONG TERM GOALS: Target date: 12/11/2022  Patient to be independent with advanced HEP. Baseline: Not yet initiated  Goal status: IN PROGRESS  Patient to demonstrate B LE strength >/=4+/5.  Baseline: See above Goal status: IN PROGRESS  Patient to demonstrate 10 deg L ankle dorsiflexion AROM WFL. Baseline: 3 deg Goal status: IN PROGRESS  Patient to score at least 27/30 on FGA in order to decrease risk of falls.   Baseline: 23 Goal status: IN PROGRESS  Patient to demonstrate symmetrical step length with ambulation without device.  Baseline: antalgia on R LE Goal status: IN PROGRESS  Patient to demonstrate 5xSTS test in <12 sec in order to decrease risk of falls.  Baseline: 16 sec Goal status: IN PROGRESS     ASSESSMENT:  CLINICAL IMPRESSION: Patient arrived to session with c/o mild head; otherwise without complaints. Increased pacing with warm up on treadmill today. Patient was able to tolerate with good gait pattern until last 30 seconds when she started to demonstrate fatigue. Initiated light plyometrics which initially showed decreased participation of R LE compared to left. Ultimately able to coordinate double foot jump fairly symmetrically. Brock string activity resulted in quicker c/o eye strain than previous sessions. Noted some fatigue at end of session.     OBJECTIVE IMPAIRMENTS: Abnormal gait, decreased activity tolerance, decreased balance, difficulty walking, decreased ROM, decreased strength, dizziness, impaired flexibility, improper body mechanics, and pain.   ACTIVITY LIMITATIONS: carrying, lifting, bending, standing, squatting, stairs, transfers, bathing, toileting, dressing, hygiene/grooming, and locomotion level  PARTICIPATION LIMITATIONS: meal prep, cleaning, laundry, driving, shopping, community activity, occupation, and church  PERSONAL FACTORS: Age, Past/current experiences, Time since onset of injury/illness/exacerbation, Transportation, and 3+ comorbidities: Anemia, anxiety, GERD, neuromuscular disorder?, migraines  are also affecting patient's functional outcome.   REHAB POTENTIAL: Good  CLINICAL DECISION MAKING: Evolving/moderate complexity  EVALUATION COMPLEXITY: Moderate  PLAN:  PT FREQUENCY: 1-2x/week  PT DURATION: 6 weeks  PLANNED INTERVENTIONS: Therapeutic exercises, Therapeutic activity, Neuromuscular re-education, Balance training, Gait training,  Patient/Family education, Self Care, Joint mobilization, Stair training, Vestibular training, Canalith repositioning, Aquatic Therapy, Dry Needling, Electrical stimulation, Cryotherapy, Moist heat, Taping, Manual therapy, and Re-evaluation  PLAN FOR NEXT SESSION: convergence, hip strengthening, plyo   Anette Guarneri, PT, DPT 11/25/22 9:30 AM  [facility] at [facility] [address], [address] [place], [place] [address] Phone # [phone] Fax # [phone]

## 2022-11-25 ENCOUNTER — Ambulatory Visit: Payer: 59 | Admitting: Occupational Therapy

## 2022-11-25 ENCOUNTER — Encounter: Payer: Self-pay | Admitting: Physical Therapy

## 2022-11-25 ENCOUNTER — Ambulatory Visit: Payer: 59 | Admitting: Physical Therapy

## 2022-11-25 DIAGNOSIS — I69351 Hemiplegia and hemiparesis following cerebral infarction affecting right dominant side: Secondary | ICD-10-CM | POA: Diagnosis not present

## 2022-11-25 DIAGNOSIS — R41840 Attention and concentration deficit: Secondary | ICD-10-CM | POA: Diagnosis not present

## 2022-11-25 DIAGNOSIS — R2681 Unsteadiness on feet: Secondary | ICD-10-CM

## 2022-11-25 DIAGNOSIS — M6281 Muscle weakness (generalized): Secondary | ICD-10-CM

## 2022-11-25 DIAGNOSIS — R2689 Other abnormalities of gait and mobility: Secondary | ICD-10-CM
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
## 2022-12-01 ENCOUNTER — Other Ambulatory Visit: Payer: Self-pay

## 2022-12-01 ENCOUNTER — Other Ambulatory Visit: Payer: Self-pay | Admitting: Physical Medicine and Rehabilitation

## 2022-12-01 ENCOUNTER — Encounter: Payer: Self-pay | Admitting: Physical Medicine & Rehabilitation

## 2022-12-02 ENCOUNTER — Telehealth: Payer: Self-pay

## 2022-12-02 ENCOUNTER — Telehealth: Payer: Self-pay | Admitting: *Deleted

## 2022-12-02 ENCOUNTER — Other Ambulatory Visit (HOSPITAL_BASED_OUTPATIENT_CLINIC_OR_DEPARTMENT_OTHER): Payer: Self-pay

## 2022-12-02 MED ORDER — PANTOPRAZOLE SODIUM 40 MG PO TBEC
40.0000 mg | DELAYED_RELEASE_TABLET | Freq: Every day | ORAL | 0 refills | Status: DC
  Filled 2022-12-02 – 2023-01-13 (×2): qty 30, 30d supply, fill #0

## 2022-12-02 NOTE — Telephone Encounter (Signed)
Ordered, f/u with PCP regarding further refills

## 2022-12-02 NOTE — Telephone Encounter (Signed)
Letter from Dr Natale Lay re-sent through [facility] to patient.

## 2022-12-02 NOTE — Telephone Encounter (Signed)
Good afternoon,   I wanted to know if you think it was OK for me to resume working on next week my last date of my disability is on the 23rd. or did you want me to wait until the first part of May. We had discussed this at my last office visit my last day of PT and OT will be this week on the 18th. They will evaluate me as well. 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 so she can get everything properly arranged for me. If you have any questions, you can reach me by my chart or by phone .   Thank you,  Donna Mccarthy  Patient called back about the above Mychart message. She said [employer] ([employer]ML[employer]) called her today about the RTW notice / date.  Call back phone [phone].

## 2022-12-04 ENCOUNTER — Ambulatory Visit: Payer: 59

## 2022-12-04 ENCOUNTER — Ambulatory Visit: Payer: 59 | Admitting: Occupational Therapy

## 2022-12-04 DIAGNOSIS — I69351 Hemiplegia and hemiparesis following cerebral infarction affecting right dominant side: Secondary | ICD-10-CM | POA: Diagnosis not present

## 2022-12-04 DIAGNOSIS — R41840 Attention and concentration deficit: Secondary | ICD-10-CM

## 2022-12-04 DIAGNOSIS — R2681 Unsteadiness on feet: Secondary | ICD-10-CM

## 2022-12-04 DIAGNOSIS — M6281 Muscle weakness (generalized): Secondary | ICD-10-CM

## 2022-12-04 DIAGNOSIS — R2689 Other abnormalities of gait and mobility: Secondary | ICD-10-CM | POA: Diagnosis not present

## 2022-12-04 NOTE — Therapy (Signed)
OUTPATIENT OCCUPATIONAL THERAPY NEURO  Treatment Note & Discharge  Patient Name: Donna Mccarthy MRN: [id number] DOB:Mar 17, 1976, 47 y.o., female Today's Date: 12/04/2022  PCP: [name], MD REFERRING PROVIDER: Charlton Amor, PA-C  OCCUPATIONAL THERAPY DISCHARGE SUMMARY  Visits from Start of Care: 4  Current functional level related to goals / functional outcomes: Pt has demonstrated improvements in activity tolerance, functional mobility, and grip and pinch strength in RUE.  Pt continues to demonstrate mild decreased shoulder ROM, but demonstrating understanding of HEP for increased ROM and overhead strengthening.   Remaining deficits: Decreased shoulder flexion - however [facility]    Education / Equipment: HEP for hand strengthening, shoulder ROM, and education on energy conservation and consideration with return to work.   Patient agrees to discharge. Patient goals were met. Patient is being discharged due to meeting the stated rehab goals..     END OF SESSION:  OT End of Session - 12/04/22 0808     Visit Number 4    Number of Visits 9    Date for OT Re-Evaluation 12/11/22    Authorization Type [place] [employer] F[employer]    OT Start Time 0806    OT Stop Time 0844    OT Time Calculation (min) 38 min                Past Medical History:  Diagnosis Date   Allergy    Anemia    Anxiety    Arthritis    arthritis knees   Blood transfusion without reported diagnosis    GERD (gastroesophageal reflux disease)    Hypothyroidism    Neuromuscular disorder    Obesity (BMI 30-39.9) 01/05/2019   Past Surgical History:  Procedure Laterality Date   BIOPSY  04/03/2020   Procedure: BIOPSY;  Surgeon: Dolores Frame, MD;  Location: [facility];  Service: Gastroenterology;;  antral, body, distal esophagus, mid esophagus   BRACHIOPLASTY Bilateral 07/02/2020   Procedure: BILATERAL BRACHIOPLASTY WITH LIPOSUCTION;  Surgeon: [name], MD;  Location: [facility];  Service: Plastics;  Laterality: Bilateral;   DILATION AND CURETTAGE OF UTERUS  2022   ESOPHAGOGASTRODUODENOSCOPY (EGD) WITH PROPOFOL N/A 04/03/2020   Procedure: ESOPHAGOGASTRODUODENOSCOPY (EGD) WITH PROPOFOL;  Surgeon: Dolores Frame, MD;  Location: [facility];  Service: Gastroenterology;  Laterality: N/A;  11:15   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   LIPOSUCTION Bilateral 07/02/2020   Procedure: LIPOSUCTION;  Surgeon: [name], MD;  Location: [facility];  Service: Plastics;  Laterality: Bilateral;   PANNICULECTOMY N/A 08/04/2019   Procedure: PANNICULECTOMY;  Surgeon: [name], MD;  Location: [facility];  Service: Plastics;  Laterality: N/A;  2 hours only, please   TEE WITHOUT CARDIOVERSION N/A 10/07/2022   Procedure: TRANSESOPHAGEAL ECHOCARDIOGRAM (TEE);  Surgeon: Meriam Sprague, MD;  Location: [facility];  Service: Cardiovascular;  Laterality: N/A;   Patient Active Problem List   Diagnosis Date Noted   Panic disorder with agoraphobia and moderate panic attacks 10/14/2022   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   CVA (cerebral vascular accident) 10/03/2022   Stroke determined by clinical assessment 10/03/2022   Dysphagia 03/29/2020   Nausea with vomiting 03/29/2020   Vitamin D deficiency 02/02/2020   S/P panniculectomy 01/02/2020   Family history of diabetes mellitus in grandmother 04/20/2019   Obesity (BMI 30.0-34.9) 04/20/2019   Obesity (BMI 30-39.9) 01/05/2019   Hypothyroidism 09/29/2017   Migraine 09/29/2017   Knee pain, bilateral 09/29/2017   Generalized  anxiety disorder 09/29/2017   Gastroesophageal reflux disease 09/29/2017    ONSET DATE: 10/03/22  REFERRING DIAG: I63.9 (ICD-10-CM) - Cerebral infarction, unspecified  THERAPY DIAG:  Muscle weakness (generalized)  Hemiplegia and hemiparesis following cerebral infarction affecting right dominant side  Attention and concentration  deficit  Rationale for Evaluation and Treatment: Rehabilitation  SUBJECTIVE:   SUBJECTIVE STATEMENT: Pt reports seeing her doctor about returning to work and that the plan is she will return next week, working from home half day initially.   Pt accompanied by: self  PERTINENT HISTORY: 47 y.o. female who was driving 0/98/11 and had sudden onset L facial droop, dysarthria, blurred vision. Drove herself to ED.MRI/MRA brain done and negative for stroke, stenosis or acute process. 2D echo showed EF 60-65% with no wall abnormalities. She had dysconjugate gaze with diplopia that Dr. Roda Shutters felt was more consistent with possible brainstem infarct secondary to small vessel disease and not seen on MRI. She was noted to have right sided weakness with sensory deficits. Neurology recommended DAPT X 3 weeks followed by [facility] alone.   PRECAUTIONS: Fall  WEIGHT BEARING RESTRICTIONS: No  PAIN:  Are you having pain? No  FALLS: Has patient fallen in last 6 months? 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  PATIENT GOALS: to get better and get back to work  OBJECTIVE:   HAND DOMINANCE: Right  ADLs: Overall ADLs: Independent with all ADLs. 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   IADLs: Shopping: mother went with her most recently Light housekeeping: Mod I, has not yet vacuumed  Meal Prep: Mod I Community mobility: currently not cleared to drive Medication management: utilizing pill box to aid in recall  Financial management: utilizing reminders Handwriting: 100% legible  MOBILITY STATUS:  Independent and mild limp due to R knee pain  POSTURE COMMENTS:  No Significant postural limitations  ACTIVITY TOLERANCE: Activity tolerance: WFL for tasks completed during eval  UPPER EXTREMITY ROM:    Active ROM Right eval Left eval Right  12/04/22  Shoulder flexion ~140 WFL WNL 150  Shoulder abduction WNL    Shoulder adduction WNL    Shoulder extension     Shoulder internal rotation WNL    Shoulder external rotation WNL    Elbow flexion WNL    Elbow extension WNL    Wrist flexion     Wrist extension     Wrist ulnar deviation     Wrist radial deviation     Wrist pronation     Wrist supination     (Blank rows = not tested)  UPPER EXTREMITY MMT:     MMT Right eval Left eval  Shoulder flexion 5/5   Shoulder abduction    Shoulder adduction    Shoulder extension    Shoulder internal rotation    Shoulder external rotation    Middle trapezius    Lower trapezius    Elbow flexion    Elbow extension    Wrist flexion    Wrist extension    Wrist ulnar deviation    Wrist radial deviation    Wrist pronation    Wrist supination    (Blank rows = not tested)  HAND FUNCTION: Grip strength: Right: 47 lbs; Left: 55 lbs, Lateral pinch: Right: 14 lbs, Left: 15 lbs, 3 point pinch: Right: 9 lbs, Left: 10 lbs, and Tip pinch: Right 8 lbs, Left: 8 lbs 12/04/22: Right: 55 lbs, Lateral pinch: 16#, 3 point pinch: 13.6#  COORDINATION: Finger Nose Finger test: WNL 9 Hole Peg test: Right: 22.66 sec; Left: 24.56 sec  SENSATION: WFL  COGNITION: Overall cognitive status: No family/caregiver present to determine baseline cognitive functioning. Pt reports intermittent increased time with sequencing and difficulty with memory with new prescriptions.  VISION: Subjective report: reports fatigue in R eye as the day goes. Baseline vision: Wears glasses all the time  VISION ASSESSMENT: To be further assessed in functional context  Patient has difficulty with following activities due to  following visual impairments: reports diplopia has resolved, however notes weakness in R eye at the end of the day  OBSERVATIONS: RUE fatigue with repetition with ROM and strength assessment.  Pt also reports fatigue with blow drying or styling hair.   TODAY'S TREATMENT:                                              12/04/22 Headache: Pt reports really bad headache/migraine last week, to the extent that she had a hard time getting out of bed one day.  Discussed stress, sleep, weather as a trigger.  Encouraged pt to build in rest breaks and/or "brain breaks" during work and daily tasks to decrease onset of stress. Return to work: Staggered re-entry to work with plans to work from home for 15, then 20 hours. Pt to complete mostly Medicare wellness visits with phone calls and data entry into computer.  Pt reports doing taxes over the past week with extended focus on computer with no additional concerns.   Functional mobility: incorporating picking up resistive clothespins/items from high and low surfaces to simulate gathering items in clinic/work.  Pt with good balance reactions when reaching outside BOS and to floor to retrieve items.  Pt then placing resistive clothespins on vertical dowel to challenge pinch strength and shoulder ROM. Grip strength: R: 54, 60, 51, average of 55#.  See above for additional pinch measurements. Shoulder ROM: engaged in supine shoulder flexion with use of dowel to facilitate increased symmetry and ROM/stretch for RUE.  Pt completing x10 with hold for 5 seconds at end range for increased ROM.  Engaged in shoulder flexion with RUE in sitting with focus on increased ROM at end range.  Discussed use of mirror for increased feedback and progressing to light weight dumbbell for additional strengthening.    11/25/22 Energy conservation: pt reports utilizing planning and pacing strategies to aid in energy conservation.  She reports that she is laying out items the night before to aid  in completing tasks in the am.   Theraputty: OT instructed pt in full hand grasp, pinch grips, and finger extension with tan theraputty.  Provided with HEP with pictures for increased carryover. Return to work: discussed observing and assessing areas of concern and discussing concerns with OT as well as supervisor as needed for accommodations.  Provided pt with list of tasks to try, to think about, and accommodations to ask for based on how she does with the "to try" column.  Discussed completing cognitive and motor tasks with distractions to assess ability to select attention in more distracting environment and  when she may need to decrease distractions.  Pt reports going to [employer] with a friend recently and was both physically and mentally exhausted afterwards.   11/07/22 Resistance Clothespins: 4,6,8# with RUE for low and high functional reaching and sustained pinch. OT facilitated dynamic reaching with crossing midline to reach to low range and outside BOS to R to place on vertical dowel in high range.  Pt demonstrating good weight shifting and functional reach with RUE and good sustained grasp.  Completed with D1 pattern as well, reaching down to R and then across midline to L with RUE to place clothespins on high dowel on L.  Pt tolerated standing 8-10 mins during activity without seated rest break.   Hand Gripper: with RUE on level 35# with [place]. Pt picked up 1 inch blocks with gripper with 20%  drops and min difficulty. OT increased challenge by placing target on elevated surface to challenge sustained grasp in conjunction with shoulder flexion. Completed reaching across midline and outside BOS, with difficulty with sustained grasp with repetition - as hand fatiguing.   Energy conservation: educated on 4P's of energy conservation (pacing, planning, prioritizing, and positioning).  OT educated on positioning during work related tasks as well as planning of items and creating "tote bag/fanny  pack" of most used items to allow for increased readiness and saving need to go back and forth to supply rooms throughout the day.  Provided pt with handouts from [employer] and [employer].  PATIENT EDUCATION: Education details: educated on functional reach with gross grasp and carryover to functional tasks. Person educated: Patient Education method: Explanation Education comprehension: verbalized understanding  HOME EXERCISE PROGRAM: Access Code: [email or web address] URL: [email or web address] Date: 11/25/2022 Prepared by: [facility] - Outpatient  Rehab - [facility]  Exercises - Putty Squeezes  - 1 x daily - 1 sets - 10 reps - Rolling Putty on Table  - 1 x daily - 1 sets - 10 reps - Thumb Opposition with Putty  - 1 x daily - 1 sets - 10 reps - 3-Point Pinch with Putty  - 1 x daily - 1 sets - 10 reps - Key Pinch with Putty  - 1 x daily - 1 sets - 10 reps - Finger Lumbricals with Putty  - 1 x daily - 1 sets - 10 reps - Finger Pinch and Pull with Putty  - 1 x daily - 1 sets - 10 reps - Seated Finger Extension with Putty  - 1 x daily - 1 sets - 10 reps - Finger Extension with Putty  - 1 x daily - 1 sets - 10 reps   Access Code: [email or web address] URL: [email or web address] Date: 12/04/2022 Prepared by: [facility] - Outpatient  Rehab - [facility]  Exercises - Supine Shoulder Flexion Extension AAROM with Dowel  - 2 x daily - 2-3 sets - 10 reps - 5-10 sec hold - Seated Shoulder Flexion Full Range Single Arm  - 2 x daily - 2-3 sets - 10 reps  GOALS: Goals reviewed with patient? Yes  SHORT TERM GOALS: Target date: 11/21/22  Pt will be Independent in UE strengthening HEP (grasp, pinch, and functional reach). Baseline: Goal status: IN PROGRESS  2.  Pt will verbalize energy conservation strategies and be able to identify 2 that could be used during IADLs as well as return to work. Baseline:  Goal status: MET - 11/25/22  LONG TERM GOALS: Target date: 12/12/22  Pt will  demonstrate improved RUE ROM and strength to allow for  sustained use of dominant RUE during grooming tasks with </+ to one rest break. Baseline:  Goal status: MET - 12/04/22  2.  Pt will verbalize understanding of return to work recommendations. Baseline:  Goal status: MET - 12/04/22  3.  Pt will demonstrate improved grip by 5# and pinch strength by 2# to engage in functional grasp as needed for IADLs and return to work. Baseline:  Goal status: MET - 12/04/22  4.  Pt will tolerate 15 mins dynamic standing/mobility task to return to leisure and work activities. Baseline:  Goal status: MET - 12/04/22  ASSESSMENT:  CLINICAL IMPRESSION: Pt demonstrating improvements in functional mobility and activity tolerance and reporting understanding of energy conservation strategies for use in home and with work tasks. Pt reporting increased confidence in regards to upcoming return to work, stating returning at part time with plan to increase as able.  Pt demonstrating understanding of shoulder flexion exercises in supine and sitting with focus on regaining remaining shoulder flexion to full ROM.  PERFORMANCE DEFICITS: in functional skills including IADLs, ROM, strength, Gross motor control, balance, endurance, decreased knowledge of precautions, vision, and UE functional use, cognitive skills including attention, memory, problem solving, safety awareness, and sequencing, and psychosocial skills including coping strategies, environmental adaptation, and routines and behaviors.   IMPAIRMENTS: are limiting patient from ADLs, IADLs, work, and leisure.   CO-MORBIDITIES: may have co-morbidities  that affects occupational performance. Patient will benefit from skilled OT to address above impairments and improve overall function.  MODIFICATION OR ASSISTANCE TO COMPLETE EVALUATION: No modification of tasks or assist necessary to complete an evaluation.  OT OCCUPATIONAL PROFILE AND HISTORY: Detailed assessment:  Review of records and additional review of physical, cognitive, psychosocial history related to current functional performance.  CLINICAL DECISION MAKING: LOW - limited treatment options, no task modification necessary  REHAB POTENTIAL: Good  EVALUATION COMPLEXITY: Low    PLAN:  OT FREQUENCY: 1-2x/week  OT DURATION: 6 weeks  PLANNED INTERVENTIONS: self care/ADL training, therapeutic exercise, therapeutic activity, neuromuscular re-education, passive range of motion, balance training, functional mobility training, ultrasound, compression bandaging, moist heat, cryotherapy, patient/family education, cognitive remediation/compensation, visual/perceptual remediation/compensation, psychosocial skills training, energy conservation, coping strategies training, and DME and/or AE instructions  RECOMMENDED OTHER SERVICES: SLP  CONSULTED AND AGREED WITH PLAN OF CARE: Patient  PLAN FOR NEXT SESSION: D/C   Adelfa Lozito, OTR/L 12/04/2022, 9:41 AM

## 2022-12-04 NOTE — Therapy (Signed)
OUTPATIENT PHYSICAL THERAPY NEURO TREATMENT   Patient Name: Donna Mccarthy MRN: [id number] DOB:[birth year], 47 y.o., female Today's Date: 12/04/2022   PCP: [name], MD  REFERRING PROVIDER: Charlton Amor, PA-C  PHYSICAL THERAPY DISCHARGE SUMMARY  Visits from Start of Care: 6  Current functional level related to goals / functional outcomes: Demo improved status and function at time of D/C meeting 5xSTS, FGA, and 5/5 BLE strength   Remaining deficits: Continues to report issues with vision/focus with onset of fatigue   Education / Equipment: HEP   Patient agrees to discharge. Patient goals were met. Patient is being discharged due to meeting the stated rehab goals.   END OF SESSION:  PT End of Session - 12/04/22 0821     Visit Number 6    Number of Visits 13    Date for PT Re-Evaluation 12/11/22    Authorization Type [employer]    Authorization - Number of Visits 25   before medical review   PT Start Time 0845    PT Stop Time 0930    PT Time Calculation (min) 45 min    Equipment Utilized During Treatment Gait belt    Activity Tolerance Patient tolerated treatment well    Behavior During Therapy WFL for tasks assessed/performed               Past Medical History:  Diagnosis Date   Allergy    Anemia    Anxiety    Arthritis    arthritis knees   Blood transfusion without reported diagnosis    GERD (gastroesophageal reflux disease)    Hypothyroidism    Neuromuscular disorder    Obesity (BMI 30-39.9) 01/05/2019   Past Surgical History:  Procedure Laterality Date   BIOPSY  04/03/2020   Procedure: BIOPSY;  Surgeon: Dolores Frame, MD;  Location: [facility];  Service: Gastroenterology;;  antral, body, distal esophagus, mid esophagus   BRACHIOPLASTY Bilateral 07/02/2020   Procedure: BILATERAL BRACHIOPLASTY WITH LIPOSUCTION;  Surgeon: [name], MD;  Location: [facility];  Service: Plastics;  Laterality: Bilateral;    DILATION AND CURETTAGE OF UTERUS  2022   ESOPHAGOGASTRODUODENOSCOPY (EGD) WITH PROPOFOL N/A 04/03/2020   Procedure: ESOPHAGOGASTRODUODENOSCOPY (EGD) WITH PROPOFOL;  Surgeon: Dolores Frame, MD;  Location: [facility];  Service: Gastroenterology;  Laterality: N/A;  11:15   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   LIPOSUCTION Bilateral 07/02/2020   Procedure: LIPOSUCTION;  Surgeon: [name], MD;  Location: [facility];  Service: Plastics;  Laterality: Bilateral;   PANNICULECTOMY N/A 08/04/2019   Procedure: PANNICULECTOMY;  Surgeon: [name], MD;  Location: [facility];  Service: Plastics;  Laterality: N/A;  2 hours only, please   TEE WITHOUT CARDIOVERSION N/A 10/07/2022   Procedure: TRANSESOPHAGEAL ECHOCARDIOGRAM (TEE);  Surgeon: Meriam Sprague, MD;  Location: [facility];  Service: Cardiovascular;  Laterality: N/A;   Patient Active Problem List   Diagnosis Date Noted   Panic disorder with agoraphobia and moderate panic attacks 10/14/2022   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   CVA (cerebral vascular accident) 10/03/2022   Stroke determined by clinical assessment 10/03/2022   Dysphagia 03/29/2020   Nausea with vomiting 03/29/2020   Vitamin D deficiency 02/02/2020   S/P panniculectomy 01/02/2020   Family history of diabetes mellitus in grandmother 04/20/2019   Obesity (BMI 30.0-34.9) 04/20/2019   Obesity (BMI 30-39.9) 01/05/2019   Hypothyroidism 09/29/2017   Migraine 09/29/2017   Knee pain, bilateral 09/29/2017  Generalized anxiety disorder 09/29/2017   Gastroesophageal reflux disease 09/29/2017    ONSET DATE: 10/03/22  REFERRING DIAG: I63.9 (ICD-10-CM) - Cerebral infarction, unspecified  THERAPY DIAG:  Muscle weakness (generalized)  Unsteadiness on feet  Other abnormalities of gait and mobility  Rationale for Evaluation and Treatment: Rehabilitation  SUBJECTIVE:                                                                                                                                                                                              SUBJECTIVE STATEMENT: Been doing her HEP. 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   Pt accompanied by: self  PERTINENT HISTORY: Anemia, anxiety, GERD, neuromuscular disorder?, migraines Per chart- "10/03/22 and had sudden onset L facial droop, dysarthria, blurred vision."   "CN VI incomplete palsy. She had dysconjugate gaze with diplopia that Dr. Roda Shutters felt was more consistent with possible brainstem infarct secondary to small vessel disease and not seen on MRI."  PAIN:  Are you having pain? Yes: NPRS scale: 0/10 Pain location: frontal HA Pain description: HA Aggravating factors: - Relieving factors: meds  PRECAUTIONS: Fall  WEIGHT BEARING RESTRICTIONS: No  FALLS: Has patient fallen in last 6 months? 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  PATIENT GOALS: work on endurance, strength, getting back to work   OBJECTIVE:   TODAY'S TREATMENT: 12/04/22 Activity Comments  5xSTS 12 sec   FGA 28/30    HEP review   STG/LTG addressed             TODAY'S TREATMENT: 11/25/22 Activity Comments  gait training on TM 2.5 mph x 3 min  2.5 mph x at 2.0 grade Good gait pattern until last 30 sec when heels started to drag; able to let go of handrails  1.5 mph x1 min cool down  jump rope  Difficulty coordinating use of jump rope; proceeded with double foot hop over threshold on floor instead 2x30"  brock string Jumping between  3 targets; required break d/t c/o eye fatigue Targets: 42.5, 35, 46 cm  Cueing to place string on nose throughout to keep it consistent   pulley: 4 way hip 10# 10x Requiring at least 1 UE support; required drop  to 5# with adduction on R       PATIENT EDUCATION: Education details: edu on pt's job requirements and ways to slowly work towards to these tasks Person educated: Patient Education method: Programmer, multimedia, Facilities manager, Actor cues, and Verbal cues Education comprehension: verbalized understanding and returned demonstration    HOME EXERCISE PROGRAM Last updated: 11/13/22 Access Code: [email or web address] URL: [email or web address] Date: 11/13/2022 Prepared by: [facility] - Outpatient  Rehab - [facility]  Exercises - Luvenia Redden  - 1 x daily - 5 x weekly - 2 sets - 10-20 reps - Walking Forward Lunge  - 1 x daily - 5 x weekly - 2 sets - 10 reps - Squat with Chair Touch  - 1 x daily - 5 x weekly - 2 sets - 10 reps - Gastroc Stretch with Foot at Wall  - 1 x daily - 5 x weekly - 2 sets - 30 sec hold - Seated Gaze Stabilization with Head Rotation  - 1 x daily - 7 x weekly - 3-5 sets - 30 sec  hold - Seated Gaze Stabilization with Head Nod  - 1 x daily - 7 x weekly - 3-5 sets - 30 sec rounds hold    Below measures were taken at time of initial evaluation unless otherwise specified:   DIAGNOSTIC FINDINGS: 10/04/22 brain MRI: . No acute intracranial process. No evidence of acute or subacute infarct.No intracranial large vessel occlusion or significant stenosis.  COGNITION: Overall cognitive status: Within functional limits for tasks assessed and reports c/o occasional word finding issues   SENSATION: Report of numbness in the R 3 lateral toes   COORDINATION: Alternating pronation/supination: intact B Alternating toe tap: intact B Finger to nose: intact B Heel to shin: intact B   PALPATION: TTP over R lateral jt line  MUSCLE TONE: LLE: Mild in L knee and ankle     POSTURE: rounded shoulders  LOWER EXTREMITY ROM:     Active  Right Eval Left Eval  Hip flexion    Hip extension    Hip abduction    Hip adduction    Hip internal rotation    Hip external rotation    Knee flexion    Knee extension    Ankle dorsiflexion 19 3  Ankle plantarflexion    Ankle inversion    Ankle eversion     (Blank rows = not tested)  LOWER EXTREMITY MMT:    MMT (in sitting) Right Eval Left Eval  Hip flexion 4- 4  Hip extension    Hip abduction 4- 4  Hip adduction 4 4  Hip internal rotation    Hip external rotation    Knee flexion 4+ 4+  Knee extension 4 4  Ankle dorsiflexion 4 4+  Ankle plantarflexion 4- 4+  Ankle inversion    Ankle eversion    (Blank rows = not tested)    VESTIBULAR ASSESSMENT   GENERAL OBSERVATION: pt wears bifocals     OCULOMOTOR EXAM:   Ocular Alignment: normal   Ocular ROM:  limited L inferior ROM   Spontaneous Nystagmus: absent   Gaze-Induced Nystagmus: absent   Smooth Pursuits:  intact; c/o strain in the R eye   Saccades:  slight overshooting to R and inferior   Convergence/Divergence: ~5 inches, when trialed again limited to ~1 ft away     VESTIBULAR - OCULAR REFLEX:    Slow VOR: Normal c/o dizziness horizontal and vertical   VOR Cancellation: Normal   GAIT: Gait pattern: antalgia on R LE  with decreased L step length Assistive device utilized: None Level of assistance: Modified independence  FUNCTIONAL TESTS:   5xSTS: 16.76 sec without Ues        PATIENT SURVEYS:  FOTO NT- pt had to leave  TODAY'S TREATMENT:                                                                                                                              DATE: 10/30/22    PATIENT EDUCATION: Education details: prognosis, POC, exam findings Person educated: Patient Education method: Explanation Education comprehension: verbalized understanding  HOME EXERCISE PROGRAM: Not yet initiated     GOALS: Goals reviewed  with patient? Yes  SHORT TERM GOALS: Target date: 11/20/2022  Patient to be independent with initial HEP. Baseline: HEP initiated Goal status: MET    LONG TERM GOALS: Target date: 12/11/2022  Patient to be independent with advanced HEP. Baseline: multiple domains addressed Goal status: MET  Patient to demonstrate B LE strength >/=4+/5.  Baseline: 5/5 strength Goal status: MET  Patient to demonstrate 10 deg L ankle dorsiflexion AROM WFL. Baseline: 3 deg; 15 degrees Goal status: MET  Patient to score at least 27/30 on FGA in order to decrease risk of falls.  Baseline: 23; 27/30 Goal status: MET  Patient to demonstrate symmetrical step length with ambulation without device.  Baseline: antalgia on R LE Goal status: MET  Patient to demonstrate 5xSTS test in <12 sec in order to decrease risk of falls.  Baseline: 16 sec; 12 sec Goal status: MET     ASSESSMENT:  CLINICAL IMPRESSION: Pt reports return to work next week but in a decreased physical capacity.  Pt reports feeling ready for D/C from therapy and reports and demonstrates independence in HEP and good teach back of self-directed progression for strength, balance, and oculomotor training.  Able to meet all STG/LTG with improvements globally in strength and functional outcome measures.  D/C to HEP at this time.     OBJECTIVE IMPAIRMENTS: Abnormal gait, decreased activity tolerance, decreased balance, difficulty walking, decreased ROM, decreased strength, dizziness, impaired flexibility, improper body mechanics, and pain.   ACTIVITY LIMITATIONS: carrying, lifting, bending, standing, squatting, stairs, transfers, bathing, toileting, dressing, hygiene/grooming, and locomotion level  PARTICIPATION LIMITATIONS: meal prep, cleaning, laundry, driving, shopping, community activity, occupation, and church  PERSONAL FACTORS: Age, Past/current experiences, Time since onset of injury/illness/exacerbation, Transportation, and 3+  comorbidities: Anemia, anxiety, GERD, neuromuscular disorder?, migraines  are also affecting patient's functional outcome.   REHAB POTENTIAL: Good  CLINICAL DECISION MAKING: Evolving/moderate complexity  EVALUATION COMPLEXITY: Moderate  PLAN:  PT FREQUENCY: 1-2x/week  PT DURATION: 6 weeks  PLANNED INTERVENTIONS: Therapeutic exercises, Therapeutic activity, Neuromuscular re-education, Balance training, Gait training, Patient/Family education, Self Care, Joint mobilization, Stair training, Vestibular training, Canalith repositioning, Aquatic Therapy, Dry Needling, Electrical stimulation, Cryotherapy, Moist heat, Taping, Manual therapy, and Re-evaluation  PLAN FOR NEXT SESSION: DC to HEP   9:03 AM, 12/04/22 M. Shary Decamp, PT, DPT Physical  Therapist- [place] Office Number: [phone]

## 2022-12-11 ENCOUNTER — Other Ambulatory Visit (HOSPITAL_BASED_OUTPATIENT_CLINIC_OR_DEPARTMENT_OTHER): Payer: Self-pay

## 2022-12-15 ENCOUNTER — Other Ambulatory Visit (HOSPITAL_BASED_OUTPATIENT_CLINIC_OR_DEPARTMENT_OTHER): Payer: Self-pay

## 2022-12-19 ENCOUNTER — Telehealth: Payer: Self-pay | Admitting: Physical Medicine & Rehabilitation

## 2022-12-19 NOTE — Telephone Encounter (Signed)
I have left patient a voicemail that her paperwork from the [facility] is ready to be picked up.  There is a $25 charge for it and we will have it waiting up front for her to pick up and pay for.

## 2023-01-01 ENCOUNTER — Other Ambulatory Visit (HOSPITAL_BASED_OUTPATIENT_CLINIC_OR_DEPARTMENT_OTHER): Payer: Self-pay

## 2023-01-05 ENCOUNTER — Ambulatory Visit (INDEPENDENT_AMBULATORY_CARE_PROVIDER_SITE_OTHER): Payer: 59 | Admitting: Nurse Practitioner

## 2023-01-05 ENCOUNTER — Encounter: Payer: Self-pay | Admitting: Nurse Practitioner

## 2023-01-05 ENCOUNTER — Other Ambulatory Visit (HOSPITAL_BASED_OUTPATIENT_CLINIC_OR_DEPARTMENT_OTHER): Payer: Self-pay

## 2023-01-05 VITALS — BP 128/82 | HR 97 | Wt 177.0 lb

## 2023-01-05 DIAGNOSIS — J0140 Acute pansinusitis, unspecified: Secondary | ICD-10-CM | POA: Diagnosis not present

## 2023-01-05 DIAGNOSIS — G43719 Chronic migraine without aura, intractable, without status migrainosus: Secondary | ICD-10-CM | POA: Diagnosis not present

## 2023-01-05 MED ORDER — AMOXICILLIN-POT CLAVULANATE 875-125 MG PO TABS
1.0000 | ORAL_TABLET | Freq: Two times a day (BID) | ORAL | 0 refills | Status: DC
Start: 2023-01-05 — End: 2023-01-26
  Filled 2023-01-05: qty 20, 10d supply, fill #0

## 2023-01-05 MED ORDER — FLUCONAZOLE 150 MG PO TABS
150.0000 mg | ORAL_TABLET | Freq: Once | ORAL | 2 refills | Status: AC
Start: 2023-01-05 — End: 2023-01-06
  Filled 2023-01-05: qty 2, 3d supply, fill #0

## 2023-01-05 MED ORDER — NURTEC 75 MG PO TBDP
ORAL_TABLET | ORAL | 0 refills | Status: DC
Start: 2023-01-05 — End: 2023-11-19

## 2023-01-05 NOTE — Patient Instructions (Signed)
You can try Pseudoephedrine to help with the pressure in the face.   I have sent in Augmentin for you as an antibiotic and diflucan.

## 2023-01-05 NOTE — Progress Notes (Signed)
  Tollie Eth, DNP, AGNP-c [facility] [address] [place], [place] [address] [phone]  Subjective:   Donna Mccarthy is a 47 y.o. female presents to day for evaluation of tingling, headache, elevated heart rate, chills, sinus pressure, and neck pain, suspecting a sinus infection.  Her symptoms have been present for more than 5 days at this time.  PMH, Medications, and Allergies reviewed and updated in chart as appropriate.   ROS negative except for what is listed in HPI. Objective:  BP 128/82   Pulse 97   Wt 177 lb (80.3 kg)   BMI 864.20 kg/m  Physical Exam   Physical examination reveals fluid behind the ears bilaterally and swollen cervical lymph nodes as well as sinus tenderness.  Patient is also tachycardic and warm to the touch suggesting a possible fever. Neurologically the patient shows no concerning signs or symptoms with all testing appropriate.      Assessment & Plan:   Problem List Items Addressed This Visit     Migraine    Patient has a history of migraines and is currently experiencing a significant headache related to sinus infection.  At this time it is unclear if possible migraine etiology is also present. Plan: Samples provided for Nurtec for the patient to try. Instructions for proper use provided for patient. Consider prescription for Nurtec for future migraine management if samples are effective.      Relevant Medications   Rimegepant Sulfate (NURTEC) 75 MG TBDP   Acute non-recurrent pansinusitis - Primary    Patient presents with headache, sinus pressure, and tingling sensation in the head.  Physical examination reveals fluid in the ears bilaterally, swollen cervical lymph nodes, and sinus tenderness. Plan: Prescribed amoxicillin for sinus infection. Advised patient to increase fluid intake and monitor for signs of fever not well-controlled with over-the-counter ibuprofen or Tylenol. May consider using over-the-counter cold  and sinus medications as tolerated for symptom management.         Tollie Eth, DNP, AGNP-c 01/27/2023  3:53 PM    History, Medications, Surgery, SDOH, and Family History reviewed and updated as appropriate.

## 2023-01-13 ENCOUNTER — Other Ambulatory Visit: Payer: Self-pay | Admitting: Family Medicine

## 2023-01-13 ENCOUNTER — Other Ambulatory Visit (HOSPITAL_BASED_OUTPATIENT_CLINIC_OR_DEPARTMENT_OTHER): Payer: Self-pay

## 2023-01-13 DIAGNOSIS — G43909 Migraine, unspecified, not intractable, without status migrainosus: Secondary | ICD-10-CM

## 2023-01-13 DIAGNOSIS — F321 Major depressive disorder, single episode, moderate: Secondary | ICD-10-CM

## 2023-01-13 MED ORDER — BUPROPION HCL ER (XL) 300 MG PO TB24
300.0000 mg | ORAL_TABLET | Freq: Every day | ORAL | 1 refills | Status: DC
Start: 2023-01-13 — End: 2023-09-16
  Filled 2023-01-13: qty 90, 90d supply, fill #0
  Filled 2023-05-01: qty 90, 90d supply, fill #1

## 2023-01-14 ENCOUNTER — Encounter (HOSPITAL_COMMUNITY): Payer: Self-pay

## 2023-01-14 ENCOUNTER — Other Ambulatory Visit (HOSPITAL_BASED_OUTPATIENT_CLINIC_OR_DEPARTMENT_OTHER): Payer: Self-pay

## 2023-01-14 ENCOUNTER — Emergency Department (HOSPITAL_COMMUNITY): Payer: 59

## 2023-01-14 ENCOUNTER — Emergency Department (HOSPITAL_COMMUNITY)
Admission: EM | Admit: 2023-01-14 | Discharge: 2023-01-14 | Disposition: A | Discharge status: Home / Self Care | Payer: 59 | Attending: Emergency Medicine | Admitting: Emergency Medicine

## 2023-01-14 DIAGNOSIS — R29810 Facial weakness: Secondary | ICD-10-CM | POA: Diagnosis not present

## 2023-01-14 DIAGNOSIS — R791 Abnormal coagulation profile: Secondary | ICD-10-CM | POA: Insufficient documentation

## 2023-01-14 DIAGNOSIS — R531 Weakness: Secondary | ICD-10-CM | POA: Diagnosis not present

## 2023-01-14 DIAGNOSIS — G4489 Other headache syndrome: Secondary | ICD-10-CM | POA: Diagnosis not present

## 2023-01-14 DIAGNOSIS — R519 Headache, unspecified: Secondary | ICD-10-CM | POA: Insufficient documentation

## 2023-01-14 DIAGNOSIS — R2990 Unspecified symptoms and signs involving the nervous system: Secondary | ICD-10-CM

## 2023-01-14 DIAGNOSIS — R9431 Abnormal electrocardiogram [ECG] [EKG]: Secondary | ICD-10-CM | POA: Diagnosis not present

## 2023-01-14 DIAGNOSIS — Z794 Long term (current) use of insulin: Secondary | ICD-10-CM | POA: Insufficient documentation

## 2023-01-14 DIAGNOSIS — R4781 Slurred speech: Secondary | ICD-10-CM | POA: Insufficient documentation

## 2023-01-14 DIAGNOSIS — Z7982 Long term (current) use of aspirin: Secondary | ICD-10-CM | POA: Insufficient documentation

## 2023-01-14 DIAGNOSIS — R29818 Other symptoms and signs involving the nervous system: Secondary | ICD-10-CM | POA: Diagnosis not present

## 2023-01-14 LAB — DIFFERENTIAL
Abs Immature Granulocytes: 0.01 10*3/uL (ref 0.00–0.07)
Basophils Absolute: 0 10*3/uL (ref 0.0–0.1)
Basophils Relative: 1 %
Eosinophils Absolute: 0.1 10*3/uL (ref 0.0–0.5)
Eosinophils Relative: 1 %
Immature Granulocytes: 0 %
Lymphocytes Relative: 49 %
Lymphs Abs: 2.2 10*3/uL (ref 0.7–4.0)
Monocytes Absolute: 0.4 10*3/uL (ref 0.1–1.0)
Monocytes Relative: 9 %
Neutro Abs: 1.7 10*3/uL (ref 1.7–7.7)
Neutrophils Relative %: 40 %

## 2023-01-14 LAB — I-STAT CHEM 8, ED
BUN: 6 mg/dL (ref 6–20)
Calcium, Ion: 1.19 mmol/L (ref 1.15–1.40)
Chloride: 107 mmol/L (ref 98–111)
Creatinine, Ser: 0.7 mg/dL (ref 0.44–1.00)
Glucose, Bld: 88 mg/dL (ref 70–99)
HCT: 36 % (ref 36.0–46.0)
Hemoglobin: 12.2 g/dL (ref 12.0–15.0)
Potassium: 3.9 mmol/L (ref 3.5–5.1)
Sodium: 140 mmol/L (ref 135–145)
TCO2: 21 mmol/L — ABNORMAL LOW (ref 22–32)

## 2023-01-14 LAB — CBC
HCT: 37.4 % (ref 36.0–46.0)
Hemoglobin: 11.6 g/dL — ABNORMAL LOW (ref 12.0–15.0)
MCH: 26.3 pg (ref 26.0–34.0)
MCHC: 31 g/dL (ref 30.0–36.0)
MCV: 84.8 fL (ref 80.0–100.0)
Platelets: 164 10*3/uL (ref 150–400)
RBC: 4.41 MIL/uL (ref 3.87–5.11)
RDW: 14.3 % (ref 11.5–15.5)
WBC: 4.4 10*3/uL (ref 4.0–10.5)
nRBC: 0 % (ref 0.0–0.2)

## 2023-01-14 LAB — COMPREHENSIVE METABOLIC PANEL
ALT: 18 U/L (ref 0–44)
AST: 18 U/L (ref 15–41)
Albumin: 3.7 g/dL (ref 3.5–5.0)
Alkaline Phosphatase: 65 U/L (ref 38–126)
Anion gap: 8 (ref 5–15)
BUN: 5 mg/dL — ABNORMAL LOW (ref 6–20)
CO2: 20 mmol/L — ABNORMAL LOW (ref 22–32)
Calcium: 8.9 mg/dL (ref 8.9–10.3)
Chloride: 108 mmol/L (ref 98–111)
Creatinine, Ser: 0.76 mg/dL (ref 0.44–1.00)
GFR, Estimated: >60 mL/min (ref >60)
Glucose, Bld: 89 mg/dL (ref 70–99)
Potassium: 3.7 mmol/L (ref 3.5–5.1)
Sodium: 136 mmol/L (ref 135–145)
Total Bilirubin: 0.9 mg/dL (ref 0.3–1.2)
Total Protein: 6.7 g/dL (ref 6.5–8.1)

## 2023-01-14 LAB — PROTIME-INR
INR: 1.2 (ref 0.8–1.2)
Prothrombin Time: 14.9 seconds (ref 11.4–15.2)

## 2023-01-14 LAB — I-STAT BETA HCG BLOOD, ED (MC, WL, AP ONLY): I-stat hCG, quantitative: 18.9 m[IU]/mL — ABNORMAL HIGH (ref <5)

## 2023-01-14 LAB — ETHANOL: Alcohol, Ethyl (B): <10 mg/dL (ref <10)

## 2023-01-14 LAB — APTT: aPTT: 25 seconds (ref 24–36)

## 2023-01-14 LAB — CBG MONITORING, ED: Glucose-Capillary: 96 mg/dL (ref 70–99)

## 2023-01-14 LAB — HCG, QUANTITATIVE, PREGNANCY: hCG, Beta Chain, Quant, S: <1 m[IU]/mL (ref <5)

## 2023-01-14 MED ORDER — PROCHLORPERAZINE EDISYLATE 10 MG/2ML IJ SOLN
10.0000 mg | Freq: Once | INTRAMUSCULAR | Status: AC
  Administered 2023-01-14: 10 mg via INTRAVENOUS
  Filled 2023-01-14: qty 2

## 2023-01-14 MED ORDER — DIPHENHYDRAMINE HCL 50 MG/ML IJ SOLN
25.0000 mg | Freq: Once | INTRAMUSCULAR | Status: AC
  Administered 2023-01-14: 25 mg via INTRAVENOUS
  Filled 2023-01-14: qty 1

## 2023-01-14 MED ORDER — TOPIRAMATE 25 MG PO TABS
75.0000 mg | ORAL_TABLET | Freq: Every day | ORAL | 0 refills | Status: DC
Start: 2023-01-14 — End: 2023-01-26
  Filled 2023-01-14: qty 270, 90d supply, fill #0

## 2023-01-14 NOTE — Code Documentation (Addendum)
Stroke Response Nurse Documentation Code Documentation  Donna Mccarthy is a 47 y.o. female arriving to [facility]  via [facility] on 01-14-23 with past medical hx of GERD, CVA s/p TNK Feb 2024 . On aspirin 81 mg daily. Code stroke was activated by EMS.   Patient at home on laptop working when she suddenly felt left side of her face droop at 1005. EMS noted right sided weakness along with left facial droop. Pt endorses headache.   Stroke team at the bedside on patient arrival. Labs drawn and patient cleared for CT by EDP. Patient to CT with team. NIHSS 7, see flowsheet for details. The following imaging was completed:  CT Head and MRI/MRA. Patient is not a candidate for IV Thrombolytic due to stroke not suspected. Patient is not a candidate for IR due to stroke not suspected.   Care Plan: q30min NIH/vitals until OOW at 1435.   Addendum 1320: MRI/MRA results negative. Code Stroke Cancelled per Otelia Limes MD. ED RN [facility] aware.   Bedside handoff with ED RN Georgiann Hahn.    Scarlette Slice K  Rapid Response RN

## 2023-01-14 NOTE — ED Notes (Signed)
Taken to MRI at this time

## 2023-01-14 NOTE — Consult Note (Addendum)
NEURO HOSPITALIST CONSULT NOTE   Requestig physician: Dr. Rosalia Hammers  Reason for Consult: Acute onset of stroke-like symptoms  History obtained from:  EMS, Patient and Chart     HPI:                                                                                                                                          Donna Mccarthy is a 47 y.o. female with a PMHx of stroke-like symptoms recently treated with TNK, hyppothyroidism and anxiety presenting with acute onset of right sided giveway weakness, left facial weakness manifesting as a skewed smile and atypical dysphasia. She also complains of a right sided headache. She does have a history of migraines, the last occurring yesterday night, but states that the headache she is now experiencing does not feel like one of her usual headaches. LKN was 10:05 AM when her face suddenly felt strange. She stated to EMS that she then went to look in the mirror and noticed that her face looked like it was drooping. On EMS arrival to her home, they noted RUE weakness and left facial droop. She was able to stand and walk a few paces to the stretcher, but was listing to the right. Her speech was also noted to be "slurred". Code Stroke was called in the field. Vitals per EMS: 128/78, HR 68, 100% on RA, RR 18, CBG 91. Home medications include ASA. She is not on a blood thinner.   Past Medical History:  Diagnosis Date   Allergy    Anemia    Anxiety    Arthritis    arthritis knees   Blood transfusion without reported diagnosis    GERD (gastroesophageal reflux disease)    Hypothyroidism    Neuromuscular disorder (HCC)    Obesity (BMI 30-39.9) 01/05/2019    Past Surgical History:  Procedure Laterality Date   BIOPSY  04/03/2020   Procedure: BIOPSY;  Surgeon: Dolores Frame, MD;  Location: [facility];  Service: Gastroenterology;;  antral, body, distal esophagus, mid esophagus   BRACHIOPLASTY Bilateral 07/02/2020   Procedure:  BILATERAL BRACHIOPLASTY WITH LIPOSUCTION;  Surgeon: [name], MD;  Location: [facility];  Service: Plastics;  Laterality: Bilateral;   DILATION AND CURETTAGE OF UTERUS  2022   ESOPHAGOGASTRODUODENOSCOPY (EGD) WITH PROPOFOL N/A 04/03/2020   Procedure: ESOPHAGOGASTRODUODENOSCOPY (EGD) WITH PROPOFOL;  Surgeon: Dolores Frame, MD;  Location: [facility];  Service: Gastroenterology;  Laterality: N/A;  11:15   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   LIPOSUCTION Bilateral 07/02/2020   Procedure: LIPOSUCTION;  Surgeon: [name], MD;  Location: [facility];  Service: Plastics;  Laterality: Bilateral;   PANNICULECTOMY N/A 08/04/2019   Procedure: PANNICULECTOMY;  Surgeon: [name], MD;  Location: [facility];  Service: Plastics;  Laterality: N/A;  2 hours only, please   TEE WITHOUT CARDIOVERSION N/A 10/07/2022   Procedure: TRANSESOPHAGEAL ECHOCARDIOGRAM (TEE);  Surgeon: Meriam Sprague, MD;  Location: [facility] [place] [facility];  Service: Cardiovascular;  Laterality: N/A;    Family History  Problem Relation Age of Onset   Hypertension Mother    Crohn's disease Mother    Colon polyps Mother    Hypertension Father        POTS   Stroke Father        TIA   Hyperlipidemia Father    Hypertension Brother    Breast cancer Maternal Grandmother    Diabetes Maternal Grandmother    Heart failure Maternal Grandmother    Thyroid disease Paternal Grandmother    Vitamin D deficiency Paternal Grandmother    Heart failure Paternal Grandfather    Heart attack Paternal Grandfather    Colon cancer Maternal Uncle        dx'd in 70's   Colon cancer Maternal Uncle 58   Breast cancer Maternal Aunt              Social History:  reports that she has never smoked. She has never used smokeless tobacco. She reports that she does not drink alcohol and does not use drugs.  Allergies  Allergen Reactions   Ketorolac Tromethamine Other (See  Comments)    "felt like whole body was on fire"   Penicillins Other (See Comments)    Mother said she had a rash   Celebrex [Celecoxib] Itching    itching   Egg-Derived Products Other (See Comments)    HOME MEDICATIONS:                                                                                                                      No current facility-administered medications on file prior to encounter.   Current Outpatient Medications on File Prior to Encounter  Medication Sig Dispense Refill   amoxicillin-clavulanate (AUGMENTIN) 875-125 MG tablet Take 1 tablet by mouth 2 (two) times daily. 20 tablet 0   aspirin 81 MG chewable tablet Chew 1 tablet (81 mg total) by mouth daily. 108 tablet 0   atorvastatin (LIPITOR) 20 MG tablet Take 1 tablet (20 mg total) by mouth daily. 30 tablet 1   buPROPion (WELLBUTRIN XL) 300 MG 24 hr tablet Take 1 tablet (300 mg total) by mouth daily. 90 tablet 1   butalbital-acetaminophen-caffeine (FIORICET) 50-325-40 MG tablet Take 1 tablet by mouth daily as needed for headache. 10 tablet 1   cholecalciferol (VITAMIN D3) 25 MCG (1000 UNIT) tablet Take 1,000 Units by mouth at bedtime. 2,000 units     diclofenac Sodium (VOLTAREN) 1 % GEL Apply 2 g topically 4 (four) times daily. 350 g 0   fluticasone (FLONASE) 50 MCG/ACT nasal spray Place 2 sprays into both nostrils as needed for allergies.     loratadine (CLARITIN) 10 MG tablet Take 10 mg by mouth daily as needed  for allergies.     Magnesium 250 MG TABS Take 250 mg by mouth daily.     pantoprazole (PROTONIX) 40 MG tablet Take 1 tablet (40 mg total) by mouth at bedtime. 30 tablet 0   Rimegepant Sulfate (NURTEC) 75 MG TBDP Take 1 tab at the first sign of a migraine. 4 tablet 0   Semaglutide-Weight Management (WEGOVY) 0.5 MG/0.5ML SOAJ Inject 0.5 mg into the skin once a week. 2 mL 5   thyroid (NP THYROID) 120 MG tablet Take 1 tablet (120 mg total) by mouth daily on an empty stomach 30 tablet 5   thyroid (NP  THYROID) 30 MG tablet Take 1 tablet (30 mg total) by mouth daily on an empty stomach. 30 tablet 5   topiramate (TOPAMAX) 25 MG tablet Take 3 tablets (75 mg total) by mouth at bedtime. 90 tablet 0   topiramate (TOPAMAX) 25 MG tablet Take 3 tablets (75 mg total) by mouth at bedtime. 270 tablet 0   Vitamin D, Ergocalciferol, (DRISDOL) 1.25 MG (50000 UNIT) CAPS capsule Take 1 capsule (50,000 Units total) by mouth every 7 (seven) days. (Patient not taking: Reported on 01/05/2023) 12 capsule 0     ROS:                                                                                                                                       As per HPI. Detailed ROS deferred in the context of acuity of presentation.    Blood pressure 127/79, pulse 74, temperature 98.9 F (37.2 C), temperature source Axillary, resp. rate 15, height 5\' 5"  (1.651 m), weight 80.8 kg, SpO2 100 %.   General Examination:                                                                                                       Physical Exam  HEENT:  /AT Lungs: Respirations unlabored Extremities: Warm and well perfused.   Neurological Examination Mental Status: Awake and alert. Oriented x 5. Speech is halting, with prominent stuttering and a strained, laborious quality that is not typical for a lesional dysarthria or aphasia, sounding most consistent with embellishment. She answers questions with one or two word replies. Follows all commands. Appears anxious.  Cranial Nerves: II: Apparent right visual field cut. PERRL.  III,IV, VI: No ptosis. EOMI. Eyes conjugate initially but then adducts left eye > right eye appearing "cross eyed" after eye movements are specifically tested with visual tracking. No nystagmus.  V: Temp sensation  is stated to be decreased on the right.  VII: When asked to smile, both sides of her perioral muscles contract asymmetrically given her mouth a skewed appearance, one side higher than the other. She then  freezes her mouth in this position while remaining "cross eyed". Quality is most consistent with embellishment.  VIII: Hearing intact to voice IX,X: Speech is hypophonic XI: Head is midline XII: Tongue deviates to the LEFT when asked to protrude Motor: RUE with 4/5 giveway weakness. RUE drifts 5 inches when asked to hold antigravity, then bobs slightly several times without losing further height. Tone normal. LUE 5/5. Tone normal.  RLE with bobbing drift and 4/5 giveway weakness.  LLE 5/5 Sensory: Temp and light touch are subjectively decreased on the right. States "left" after a pause when right and left arms are simultaneously touched.  Deep Tendon Reflexes: 2+ and symmetric throughout. Negative Hoffman's and Babinski signs bilaterally.  Cerebellar: No ataxia with FNF or H-S bilaterally. When asked to perform finger to nose on the right, she takes 30 seconds to almost reach her nose while staff coaches/encourages her. Affect is labored during this procedure with antagonist and agonist muscles of RUE co-contracting giving limb a "straning" appearance. Overall quality is most consistent with embellishment.  Gait: Deferred   Lab Results: Basic Metabolic Panel: Recent Labs  Lab 01/14/23 1119  NA 140  K 3.9  CL 107  GLUCOSE 88  BUN 6  CREATININE 0.70    CBC: Recent Labs  Lab 01/14/23 1110 01/14/23 1119  WBC 4.4  --   NEUTROABS 1.7  --   HGB 11.6* 12.2  HCT 37.4 36.0  MCV 84.8  --   PLT 164  --     Cardiac Enzymes: No results for input(s): "CKTOTAL", "CKMB", "CKMBINDEX", "TROPONINI" in the last 168 hours.  Lipid Panel: No results for input(s): "CHOL", "TRIG", "HDL", "CHOLHDL", "VLDL", "LDLCALC" in the last 168 hours.  Imaging: CT HEAD CODE STROKE WO CONTRAST  Result Date: 01/14/2023 CLINICAL DATA:  Code stroke.  Slurred speech EXAM: CT HEAD WITHOUT CONTRAST TECHNIQUE: Contiguous axial images were obtained from the base of the skull through the vertex without intravenous  contrast. RADIATION DOSE REDUCTION: This exam was performed according to the departmental dose-optimization program which includes automated exposure control, adjustment of the mA and/or kV according to patient size and/or use of iterative reconstruction technique. COMPARISON:  CT Head 10/03/22, MR Head 10/04/22 FINDINGS: Brain: No evidence of acute infarction, hemorrhage, hydrocephalus, extra-axial collection or mass lesion/mass effect. Vascular: No hyperdense vessel or unexpected calcification. Skull: Normal. Negative for fracture or focal lesion. Sinuses/Orbits: No middle ear or mastoid effusion. Paranasal sinuses are clear. Orbits are unremarkable. Other: None. ASPECTS ([place] Stroke Program Early CT Score):10 IMPRESSION: No hemorrhage or CT evidence of acute infarct.  Aspects 10. Findings were paged to Dr.Bryker Fletchall On 01/14/2023 at 11:19  AM Electronically Signed   By: Lorenza Cambridge M.D.   On: 01/14/2023 11:20     Assessment: 47 year old female with a PMHx of stroke like symptoms treated with TNK, presenting with acute onset of right sided giveway weakness, left facial weakness manifesting as a skewed smile and atypical dysphasia.  - Exam reveals multiple features such as giveway weakness as well as inconsistencies and non-physiological appearing responses that are most consistent with embellishment.  - CT head: No hemorrhage or CT evidence of acute infarct.  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   Recommendations: - STAT MRI brain with MRA head - Compazine 10 mg IV x 1 for headache - Further recommendations following MRI.    Electronically signed: Dr. Caryl Pina 01/14/2023, 11:23 AM

## 2023-01-14 NOTE — Discharge Instructions (Signed)
It was a pleasure taking care of you today.  As discussed, your MRI did not show evidence of a stroke.  I suspect you had a complicated migraine causing your symptoms.  Please call your neurologist to schedule an appointment within the next week for further evaluation.  Return to the ER for any worsening symptoms.

## 2023-01-14 NOTE — ED Notes (Signed)
Pt started complaining of feeling jittery with palpitations after compazine was given. Benadryl IVP given at this time. Provider has seen pt. Will continue to monitor.

## 2023-01-14 NOTE — ED Notes (Signed)
Code stroke cancelled per stroke RN. Provider notified.

## 2023-01-14 NOTE — ED Provider Notes (Signed)
[place] [facility]MENT AT [facility] Provider Note   CSN: [id number] Arrival date & time: 01/14/23  1106  An emergency department physician performed an initial assessment on this suspected stroke patient at 1106.  History  Chief Complaint  Patient presents with   Code Stroke    Donna Mccarthy is a 47 y.o. female with a past medical history significant for migraines, anxiety, vitamin D deficiency who presents to the ED due to left-sided facial droop, slurred speech, and right-sided weakness.  Last known well 10:05 AM.  Patient states she was at her laptop working when she suddenly developed a left-sided facial droop.  Patient also admitting to right-sided weakness.  She also admits to a headache.  History of migraines.  Patient had strokelike symptoms in February 2024 where she was given TNK.  MRI was negative however, it was felt like patient had a small brainstem stroke given continued deficits on exam.  Currently on ASA 81 mg.  No other blood thinners.  History obtained from patient and past medical records. No interpreter used during encounter.       Home Medications Prior to Admission medications   Medication Sig Start Date End Date Taking? Authorizing Provider  amoxicillin-clavulanate (AUGMENTIN) 875-125 MG tablet Take 1 tablet by mouth 2 (two) times daily. 01/05/23   Tollie Eth, NP  aspirin 81 MG chewable tablet Chew 1 tablet (81 mg total) by mouth daily. 11/20/22   Fanny Dance, MD  atorvastatin (LIPITOR) 20 MG tablet Take 1 tablet (20 mg total) by mouth daily. 11/20/22   Fanny Dance, MD  buPROPion (WELLBUTRIN XL) 300 MG 24 hr tablet Take 1 tablet (300 mg total) by mouth daily. 01/13/23   [name], MD  butalbital-acetaminophen-caffeine (FIORICET) [phone] MG tablet Take 1 tablet by mouth daily as needed for headache. 11/19/22   Windell Norfolk, MD  cholecalciferol (VITAMIN D3) 25 MCG (1000 UNIT) tablet Take 1,000 Units by mouth at bedtime. 2,000  units    [provider]  diclofenac Sodium (VOLTAREN) 1 % GEL Apply 2 g topically 4 (four) times daily. 10/21/22   Love, Evlyn Kanner, PA-C  fluticasone (FLONASE) 50 MCG/ACT nasal spray Place 2 sprays into both nostrils as needed for allergies.    [provider]  loratadine (CLARITIN) 10 MG tablet Take 10 mg by mouth daily as needed for allergies.    [provider]  Magnesium 250 MG TABS Take 250 mg by mouth daily.    [provider]  pantoprazole (PROTONIX) 40 MG tablet Take 1 tablet (40 mg total) by mouth at bedtime. 12/02/22   Fanny Dance, MD  Rimegepant Sulfate (NURTEC) 75 MG TBDP Take 1 tab at the first sign of a migraine. 01/05/23   Tollie Eth, NP  Semaglutide-Weight Management (WEGOVY) 0.5 MG/0.5ML SOAJ Inject 0.5 mg into the skin once a week. 10/31/22     thyroid (NP THYROID) 120 MG tablet Take 1 tablet (120 mg total) by mouth daily on an empty stomach 10/31/22     thyroid (NP THYROID) 30 MG tablet Take 1 tablet (30 mg total) by mouth daily on an empty stomach. 11/20/22     topiramate (TOPAMAX) 25 MG tablet Take 3 tablets (75 mg total) by mouth at bedtime. 10/21/22 10/21/23  Jacquelynn Cree, PA-C  topiramate (TOPAMAX) 25 MG tablet Take 3 tablets (75 mg total) by mouth at bedtime. 01/14/23 01/14/24  [name], MD  Vitamin D, Ergocalciferol, (DRISDOL) 1.25 MG (50000 UNIT) CAPS capsule Take  1 capsule (50,000 Units total) by mouth every 7 (seven) days. Patient not taking: Reported on 01/05/2023 08/26/22   [name], MD      Allergies    Ketorolac tromethamine, Penicillins, Celebrex [celecoxib], and Egg-derived products    Review of Systems   Review of Systems  Constitutional:  Negative for fever.  Neurological:  Positive for facial asymmetry, speech difficulty, weakness and headaches.    Physical Exam Updated Vital Signs BP 116/71   Pulse 84   Temp 98.9 F (37.2 C) (Axillary)   Resp 15   Ht 5\' 5"  (1.651 m)   Wt 80.8 kg   SpO2 100%   BMI 29.64 kg/m   Physical Exam Vitals and nursing note reviewed.  Constitutional:      General: She is not in acute distress.    Appearance: She is not ill-appearing.  HENT:     Head: Normocephalic.  Eyes:     Pupils: Pupils are equal, round, and reactive to light.  Cardiovascular:     Rate and Rhythm: Normal rate and regular rhythm.     Pulses: Normal pulses.     Heart sounds: Normal heart sounds. No murmur heard.    No friction rub. No gallop.  Pulmonary:     Effort: Pulmonary effort is normal.     Breath sounds: Normal breath sounds.  Abdominal:     General: Abdomen is flat. There is no distension.     Palpations: Abdomen is soft.     Tenderness: There is no abdominal tenderness. There is no guarding or rebound.  Musculoskeletal:        General: Normal range of motion.     Cervical back: Neck supple.  Skin:    General: Skin is warm and dry.  Neurological:     General: No focal deficit present.     Mental Status: She is alert.     Comments: Left facial droop Slurred speech Pronator drift on right side  Psychiatric:        Mood and Affect: Mood normal.        Behavior: Behavior normal.     ED Results / Procedures / Treatments   Labs (all labs ordered are listed, but only abnormal results are displayed) Labs Reviewed  CBC - Abnormal; Notable for the following components:      Result Value   Hemoglobin 11.6 (*)    All other components within normal limits  COMPREHENSIVE METABOLIC PANEL - Abnormal; Notable for the following components:   CO2 20 (*)    BUN 5 (*)    All other components within normal limits  I-STAT CHEM 8, ED - Abnormal; Notable for the following components:   TCO2 21 (*)    All other components within normal limits  I-STAT BETA HCG BLOOD, ED ([facility], [facility], [facility] ONLY) - Abnormal; Notable for the following components:   I-stat hCG, quantitative 18.9 (*)    All other components within normal limits  PROTIME-INR  APTT  DIFFERENTIAL  ETHANOL  HCG, QUANTITATIVE, PREGNANCY   CBG MONITORING, ED    EKG None  Radiology MR BRAIN WO CONTRAST  Result Date: 01/14/2023 CLINICAL DATA:  Neuro deficit, acute, stroke suspected EXAM: MRI HEAD WITHOUT CONTRAST MRA HEAD WITHOUT CONTRAST TECHNIQUE: Multiplanar, multi-echo pulse sequences of the brain and surrounding structures were acquired without intravenous contrast. Angiographic images of the Circle of Willis were acquired using MRA technique without intravenous contrast. COMPARISON:  MR Head 10/04/22, CT head 01/14/23 FINDINGS: MRI HEAD FINDINGS Brain:  No acute infarction, hemorrhage, hydrocephalus, extra-axial collection or mass lesion. No pathologic intracranial enhancement. Vascular: See below Skull and upper cervical spine: Normal marrow signal. Sinuses/Orbits: No middle ear or mastoid effusion. Paranasal sinuses are clear. Orbits are unremarkable. Other: None. MRA HEAD FINDINGS Anterior circulation: No aneurysm. No stenosis. No proximal occlusion. Posterior circulation: No aneurysm. No stenosis. No proximal occlusion. Anatomic variants: Small PCOM seen on the right. IMPRESSION: 1. No acute intracranial abnormality. 2. No intracranial large vessel occlusion or significant stenosis. Electronically Signed   By: Lorenza Cambridge M.D.   On: 01/14/2023 12:30   MR ANGIO HEAD WO CONTRAST  Result Date: 01/14/2023 CLINICAL DATA:  Neuro deficit, acute, stroke suspected EXAM: MRI HEAD WITHOUT CONTRAST MRA HEAD WITHOUT CONTRAST TECHNIQUE: Multiplanar, multi-echo pulse sequences of the brain and surrounding structures were acquired without intravenous contrast. Angiographic images of the Circle of Willis were acquired using MRA technique without intravenous contrast. COMPARISON:  MR Head 10/04/22, CT head 01/14/23 FINDINGS: MRI HEAD FINDINGS Brain: No acute infarction, hemorrhage, hydrocephalus, extra-axial collection or mass lesion. No pathologic intracranial enhancement. Vascular: See below Skull and upper cervical spine: Normal marrow signal.  Sinuses/Orbits: No middle ear or mastoid effusion. Paranasal sinuses are clear. Orbits are unremarkable. Other: None. MRA HEAD FINDINGS Anterior circulation: No aneurysm. No stenosis. No proximal occlusion. Posterior circulation: No aneurysm. No stenosis. No proximal occlusion. Anatomic variants: Small PCOM seen on the right. IMPRESSION: 1. No acute intracranial abnormality. 2. No intracranial large vessel occlusion or significant stenosis. Electronically Signed   By: Lorenza Cambridge M.D.   On: 01/14/2023 12:30   CT HEAD CODE STROKE WO CONTRAST  Result Date: 01/14/2023 CLINICAL DATA:  Code stroke.  Slurred speech EXAM: CT HEAD WITHOUT CONTRAST TECHNIQUE: Contiguous axial images were obtained from the base of the skull through the vertex without intravenous contrast. RADIATION DOSE REDUCTION: This exam was performed according to the departmental dose-optimization program which includes automated exposure control, adjustment of the mA and/or kV according to patient size and/or use of iterative reconstruction technique. COMPARISON:  CT Head 10/03/22, MR Head 10/04/22 FINDINGS: Brain: No evidence of acute infarction, hemorrhage, hydrocephalus, extra-axial collection or mass lesion/mass effect. Vascular: No hyperdense vessel or unexpected calcification. Skull: Normal. Negative for fracture or focal lesion. Sinuses/Orbits: No middle ear or mastoid effusion. Paranasal sinuses are clear. Orbits are unremarkable. Other: None. ASPECTS ([place] Stroke Program Early CT Score):10 IMPRESSION: No hemorrhage or CT evidence of acute infarct.  Aspects 10. Findings were paged to Dr.Lindzen On 01/14/2023 at 11:19  AM Electronically Signed   By: Lorenza Cambridge M.D.   On: 01/14/2023 11:20    Procedures Procedures    Medications Ordered in ED Medications  prochlorperazine (COMPAZINE) injection 10 mg (10 mg Intravenous Given 01/14/23 1159)  diphenhydrAMINE (BENADRYL) injection 25 mg (25 mg Intravenous Given 01/14/23 1247)    ED  Course/ Medical Decision Making/ A&P Clinical Course as of 01/14/23 1508  Wed Jan 14, 2023  1416 Called lab to inquire about hcg. Lab not running yet. Will run now. [CA]    Clinical Course User Index [CA] Mannie Stabile, PA-C                             Medical Decision Making Amount and/or Complexity of Data Reviewed Independent Historian: parent and EMS    Details: Parents at bedside provided history External Data Reviewed: notes. Labs: ordered. Decision-making details documented in ED Course. Radiology: ordered and independent interpretation  performed. Decision-making details documented in ED Course. ECG/medicine tests: ordered and independent interpretation performed. Decision-making details documented in ED Course.  Risk Prescription drug management.   This patient presents to the ED for concern of slurred speech, weakness, this involves an extensive number of treatment options, and is a complaint that carries with it a high risk of complications and morbidity.  The differential diagnosis includes CVA, complicated migraine, metabolic derangement, etc  47 year old female with a history of possible CVA however, negative MRI in February 2024 given TNK who presents to the ED as a code stroke.  Patient developed left facial droop, slurred speech, and right-sided weakness around 10:05 AM this morning.  On ASA 81 mg.  Met patient at the bridge.  Patient maintaining airway without difficulty.  Neurological exam significant for left-sided facial droop and right-sided weakness.  Some slurred speech.  Patient brought directly to CT scan which I personally reviewed and interpreted which was negative for bleed.  Patient then brought straight to MRI which was negative for CVA. Symptoms likely related to complicated migraine.  Compazine and Benadryl given.   CBC reassuring.  No leukocytosis.  Mild anemia with hemoglobin 11.6.  I-STAT hCG mildly elevated.  Added hCG quantitative.  Ethanol within  normal limits. EKG NSR.   Repeat quantitative hcg normal.   2:15 PM reassessed patient at bedside.  Patient's headache has completely resolved.  Normal speech.  No facial droop.  Right-sided weakness has resolved.  Patient able to ambulate without difficulty.  Suspect symptoms related to complicated migraine.  Patient has a neurologist and currently on migraine medication.  Advised patient to call neurologist to schedule an appointment for further evaluation. Patient stable for discharge. Strict ED precautions discussed with patient. Patient states understanding and agrees to plan. Patient discharged home in no acute distress and stable vitals  Has PCP Hx migraines        Final Clinical Impression(s) / ED Diagnoses Final diagnoses:  Stroke-like symptoms    Rx / DC Orders ED Discharge Orders     None         Jesusita Oka 01/14/23 1509    Margarita Grizzle, MD 01/15/23 [phone]

## 2023-01-14 NOTE — ED Triage Notes (Signed)
Code stroke. LKW at 0730AM. Hx of stroke on Feb with no deficits. Reports pt was typing when she noticed face was getting heavy. Here for left sided facial droop, slurred speech, and right sided weakness. Alert and oriented x 4.

## 2023-01-15 ENCOUNTER — Other Ambulatory Visit: Payer: Self-pay

## 2023-01-15 ENCOUNTER — Telehealth: Payer: Self-pay

## 2023-01-15 NOTE — Patient Outreach (Signed)
Telephone outreach to patient to obtain mRS was successfully completed. MRS= 0  Migel Hannis [facility] Care Management Assistant [phone]  

## 2023-01-15 NOTE — Transitions of Care (Post Inpatient/ED Visit) (Signed)
01/15/2023  Name: Donna Mccarthy MRN: [id number] DOB: 03/08/1976  Today's TOC FU Call Status: Today's TOC FU Call Status:: Successful TOC FU Call Competed TOC FU Call Complete Date: 01/15/23  Transition Care Management Follow-up Telephone Call Date of Discharge: 01/14/23 Discharge Facility: [facility]) Type of Discharge: Emergency Department Reason for ED Visit: Neurologic Neurologic Diagnosis:  (complex migraine) How have you been since you were released from the hospital?: Better Any questions or concerns?: No  Items Reviewed: Did you receive and understand the discharge instructions provided?: Yes Medications obtained,verified, and reconciled?: Yes (Medications Reviewed) Any new allergies since your discharge?: No Dietary orders reviewed?: NA Do you have support at home?: Yes  Medications Reviewed Today: Medications Reviewed Today     Reviewed by Tollie Eth, NP (Nurse Practitioner) on 01/05/23 at 1624  Med List Status: <None>   Medication Order Taking? Sig Documenting Provider Last Dose Status Informant  amoxicillin-clavulanate (AUGMENTIN) 875-125 MG tablet [id number] Yes Take 1 tablet by mouth 2 (two) times daily. Tollie Eth, NP  Active   aspirin 81 MG chewable tablet [id number] Yes Chew 1 tablet (81 mg total) by mouth daily. Fanny Dance, MD Taking Active   atorvastatin (LIPITOR) 20 MG tablet [id number] Yes Take 1 tablet (20 mg total) by mouth daily. Fanny Dance, MD Taking Active   buPROPion (WELLBUTRIN XL) 300 MG 24 hr tablet [id number] Yes Take 1 tablet (300 mg total) by mouth daily. [name], MD Taking Active Self, Pharmacy Records  butalbital-acetaminophen-caffeine [facility]) [phone] MG tablet [id number] Yes Take 1 tablet by mouth daily as needed for headache. Windell Norfolk, MD Taking Active   cholecalciferol (VITAMIN D3) 25 MCG (1000 UNIT) tablet [id number] Yes Take 1,000 Units by mouth at bedtime. 2,000 units [provider] Taking Active    diclofenac Sodium (VOLTAREN) 1 % GEL [id number] Yes Apply 2 g topically 4 (four) times daily. Jacquelynn Cree, PA-C Taking Active            Med Note [facility] Oct 29, 2022 11:22 AM) As needed  fluconazole (DIFLUCAN) 150 MG tablet [id number] Yes Take 1 tablet (150 mg total) by mouth once for 1 dose. May repeat in 3 days if symptoms not resolved Early, Sung Amabile, NP  Active   fluticasone (FLONASE) 50 MCG/ACT nasal spray [id number] Yes Place 2 sprays into both nostrils as needed for allergies. [provider] Taking Active Self, Pharmacy Records           Med Note [facility] Oct 29, 2022 11:22 AM) As needed  loratadine (CLARITIN) 10 MG tablet [id number] Yes Take 10 mg by mouth daily as needed for allergies. [provider] Taking Active Self, Pharmacy Records           Med Note [facility] Oct 29, 2022 11:22 AM) As needed  Magnesium 250 MG TABS [id number] Yes Take 250 mg by mouth daily. [provider] Taking Active Self, Pharmacy Records           Med Note [facility] Oct 29, 2022 11:23 AM) As needed  pantoprazole (PROTONIX) 40 MG tablet [id number] Yes Take 1 tablet (40 mg total) by mouth at bedtime. Fanny Dance, MD Taking Active            Med Note Luciana Axe, Santonio Speakman D   Mon Jan 05, 2023  3:53 PM) Needs refill  Rimegepant Sulfate (NURTEC) 75 MG TBDP [id number]  Yes Take 1 tab at the first sign of a migraine. Tollie Eth, NP  Active   Semaglutide-Weight Management [facility]) 0.5 MG/0.5ML Ivory Broad [id number] Yes Inject 0.5 mg into the skin once a week.  Taking Active   thyroid (NP THYROID) 120 MG tablet [id number] Yes Take 1 tablet (120 mg total) by mouth daily on an empty stomach  Taking Active   thyroid (NP THYROID) 30 MG tablet [id number] Yes Take 1 tablet (30 mg total) by mouth daily on an empty stomach.  Taking Active   topiramate (TOPAMAX) 25 MG tablet [id number] Yes Take 3 tablets (75 mg total) by mouth at bedtime. Jacquelynn Cree, PA-C Taking Active   Vitamin D, Ergocalciferol, (DRISDOL) 1.25 MG (50000 UNIT) CAPS capsule [id number] No Take 1 capsule (50,000 Units total) by mouth every 7 (seven) days.  Patient not taking: Reported on 01/05/2023   [name], MD Not Taking Active Self, Pharmacy Records           Med Note [facility] Oct 29, 2022 11:27 AM) [name] on Thursday            Home Care and Equipment/Supplies: Were Home Health Services Ordered?: No Any new equipment or medical supplies ordered?: No  Functional Questionnaire: Do you need assistance with bathing/showering or dressing?: No Do you need assistance with meal preparation?: No Do you need assistance with eating?: No Do you have difficulty maintaining continence: No Do you need assistance with getting out of bed/getting out of a chair/moving?: No Do you have difficulty managing or taking your medications?: No  Follow up appointments reviewed: PCP Follow-up appointment confirmed?: No (no f/u needed with PCP) Specialist Hospital Follow-up appointment confirmed?: No Reason Specialist Follow-Up Not Confirmed: Patient has Specialist Provider Number and will Call for Appointment Do you need transportation to your follow-up appointment?: No Do you understand care options if your condition(s) worsen?: Yes-patient verbalized understanding    SIGNATURE Clyda Hurdle RMA

## 2023-01-20 DIAGNOSIS — E039 Hypothyroidism, unspecified: Secondary | ICD-10-CM | POA: Diagnosis not present

## 2023-01-26 ENCOUNTER — Encounter: Payer: Self-pay | Admitting: Physical Medicine & Rehabilitation

## 2023-01-26 ENCOUNTER — Other Ambulatory Visit (HOSPITAL_BASED_OUTPATIENT_CLINIC_OR_DEPARTMENT_OTHER): Payer: Self-pay

## 2023-01-26 ENCOUNTER — Other Ambulatory Visit: Payer: 59

## 2023-01-26 ENCOUNTER — Other Ambulatory Visit: Payer: Self-pay | Admitting: Endocrinology

## 2023-01-26 ENCOUNTER — Encounter: Payer: 59 | Attending: Physical Medicine & Rehabilitation | Admitting: Physical Medicine & Rehabilitation

## 2023-01-26 VITALS — BP 106/73 | HR 79 | Ht 65.0 in | Wt 178.0 lb

## 2023-01-26 DIAGNOSIS — G43909 Migraine, unspecified, not intractable, without status migrainosus: Secondary | ICD-10-CM | POA: Diagnosis not present

## 2023-01-26 DIAGNOSIS — Z5181 Encounter for therapeutic drug level monitoring: Secondary | ICD-10-CM

## 2023-01-26 DIAGNOSIS — I6302 Cerebral infarction due to thrombosis of basilar artery: Secondary | ICD-10-CM | POA: Insufficient documentation

## 2023-01-26 DIAGNOSIS — E049 Nontoxic goiter, unspecified: Secondary | ICD-10-CM | POA: Diagnosis not present

## 2023-01-26 DIAGNOSIS — E669 Obesity, unspecified: Secondary | ICD-10-CM | POA: Diagnosis not present

## 2023-01-26 DIAGNOSIS — N926 Irregular menstruation, unspecified: Secondary | ICD-10-CM | POA: Diagnosis not present

## 2023-01-26 DIAGNOSIS — E039 Hypothyroidism, unspecified: Secondary | ICD-10-CM | POA: Diagnosis not present

## 2023-01-26 LAB — LIPID PANEL
Chol/HDL Ratio: 3 ratio (ref 0.0–4.4)
Cholesterol, Total: 151 mg/dL (ref 100–199)
HDL: 51 mg/dL (ref >39)
LDL Chol Calc (NIH): 89 mg/dL (ref 0–99)
Triglycerides: 51 mg/dL (ref 0–149)
VLDL Cholesterol Cal: 11 mg/dL (ref 5–40)

## 2023-01-26 LAB — HEPATIC FUNCTION PANEL
ALT: 27 IU/L (ref 0–32)
AST: 21 IU/L (ref 0–40)
Albumin: 4.2 g/dL (ref 3.9–4.9)

## 2023-01-26 MED ORDER — WEGOVY 1 MG/0.5ML ~~LOC~~ SOAJ
1.0000 mg | SUBCUTANEOUS | 5 refills | Status: DC
  Filled 2023-01-26 (×2): qty 2, 28d supply, fill #0

## 2023-01-26 MED ORDER — THYROID 120 MG PO TABS
120.0000 mg | ORAL_TABLET | Freq: Every day | ORAL | 5 refills | Status: DC
  Filled 2023-01-26 – 2023-02-25 (×2): qty 30, 30d supply, fill #0

## 2023-01-26 NOTE — Progress Notes (Signed)
Subjective:    Patient ID: Donna Mccarthy, female    DOB: 11-01-1975, 47 y.o.   MRN: [id number]  HPI DC summary 10/22/22 Brief HPI:   Donna Mccarthy is a 47 y.o. female with history of migraines, anxiety, vitamin D deficiency, COVID 3 weeks prior to admission on 10/03/2022 with left facial droop, blurred vision and slurred speech.  She was found to have severe dysarthria with left facial droop that was variable and bilateral CN VII incomplete palsy.  CTA without LVO and she received TNK.  MRI/MRA brain done and was negative for stroke, stenosis or acute process.  She did have disconjugate gaze with diplopia that Dr. Roda Shutters felt was more consistent with possible brainstem infarct secondary to small vessel disease and not seen on MRI.  Neurology recommended DAPT x 3 weeks followed by aspirin alone.  Therapy was working with patient who continued to be limited by double vision, right-sided weakness with decrease in motor control as well as sensory deficits.  CIR was recommended due to functional decline.   Hospital Course: Donna Mccarthy was admitted to rehab 10/08/2022 for inpatient therapies to consist of PT, ST and OT at least three hours five days a week. Past admission physiatrist, therapy team and rehab RN have worked together to provide customized collaborative inpatient rehab.  She was maintained on DAPT during her stay and is tolerating this without any side effects.  Follow-up CBC did show slight drop in H&H without any signs of bleeding.  She completes her DAPT close in 48 hours and needs to stop Plavix and will continue on aspirin only.  Recommend repeat check CBC in 1 to 2 weeks to monitor for H&H stability.   Her mood has been stable on home dose Wellbutrin.  She she reported pain right calf therefore BLE Dopplers done and were negative for DVT.  PT has worked with patient on stretches and Voltaren gel and K-pad will use with resolution of symptoms. Her blood pressures were monitored on  TID basis and have been stable.  Transient hypokalemia has resolved with brief supplementation.  Headaches are currently managed with use of Topamax alone.  Ergocalciferol was resumed due to history of low vitamin D levels.  Made good gains during his stay and is modified independent at discharge.  She will continue to receive outpatient PT, OT and ST at [facility] neuro rehab at [facility] after discharge.     Rehab course: During patient's stay in rehab weekly team conferences were held to monitor patient's progress, set goals and discuss barriers to discharge. At admission, patient required mod assist with mobility and min assist with basic ADL tasks. She exhibited mild cognitive linguistic deficits affecting short-term recall, visual-spatial skills and attention. She  has had improvement in activity tolerance, balance, postural control as well as ability to compensate for deficits. She has had improvement in functional use RUE  and RLE as well as improvement in awareness.  She is able to complete ADL tasks at modified independent level.  She is modified independent for transfers and to ambulate 300 feet without assistive device.  She is able to complete functional and cognitive tasks at modified independent level.     Interval History 11/20/22 Donna Mccarthy reports she has been doing well since discharging home.  She continues to work with outpatient PT and OT.  She says she is still working on balance and endurance with activities.  Neurology has told her that she could likely resume driving.  She  reports she will sometimes have a mild tremor in her right hand.  She says she is doing well cognitively however she feels like she continues to have mild deficits.  She has to write things down more, it takes longer to remember things.  Interval History 01/26/23 Pt here for f/u after imaging negative CVA. She reports she was doing well overall. She reports ER visit 5/29 with R sided weakness, felt to be low  suspicion for CVA with negative imaging possibly complicated migraine related.  Symptoms resolved. She is anxious to return to clinical work, she has been working from home.   Pain Inventory Average Pain 0 Pain Right Now 0 My pain is  NO PAINE  LOCATION OF PAIN  NO PAIN, only fatigue on right side of body  BOWEL Number of stools per week: 2-3 Oral laxative use Yes  Type of laxative OTC  BLADDER Normal   Mobility walk without assistance ability to climb steps?  yes do you drive?  yes Do you have any goals in this area?  yes  Function employed # of hrs/week 40 HOURS PER WEEK AT HOME Do you have any goals in this area?  yes  Neuro/Psych tremor tingling confusion  Prior Studies Any changes since last visit?  yes CT/MRI IN [facility]  Physicians involved in your care Any changes since last visit?  no   Family History  Problem Relation Age of Onset   Hypertension Mother    Crohn's disease Mother    Colon polyps Mother    Hypertension Father        POTS   Stroke Father        TIA   Hyperlipidemia Father    Hypertension Brother    Breast cancer Maternal Grandmother    Diabetes Maternal Grandmother    Heart failure Maternal Grandmother    Thyroid disease Paternal Grandmother    Vitamin D deficiency Paternal Grandmother    Heart failure Paternal Grandfather    Heart attack Paternal Grandfather    Colon cancer Maternal Uncle        dx'd in 4's   Colon cancer Maternal Uncle 58   Breast cancer Maternal Aunt    Social History   Socioeconomic History   Marital status: Single    Spouse name: Not on file   Number of children: 1   Years of education: 14   Highest education level: Tax adviser degree: occupational, Scientist, product/process development, or vocational program  Occupational History   Occupation: cma    Employer: [employer]  Tobacco Use   Smoking status: Never   Smokeless tobacco: Never  Vaping Use   Vaping Use: Never used  Substance and Sexual Activity   Alcohol use:  No   Drug use: No   Sexual activity: Yes    Birth control/protection: I.U.D.  Other Topics Concern   Not on file  Social History Narrative   Lives alone.   Son [name] lives in [place], [place], girlfriend is pregnant.  Graduates from school on 07/2022.   Works in [employer] pool      Updated 04/2022   Social Determinants of Health   Financial Resource Strain: Not on file  Food Insecurity: No Food Insecurity (10/03/2022)   Hunger Vital Sign    Worried About Running Out of Food in the Last Year: Never true    Ran Out of Food in the Last Year: Never true  Transportation Needs: No Transportation Needs (10/03/2022)   PRAPARE - Transportation    Lack  of Transportation (Medical): No    Lack of Transportation (Non-Medical): No  Physical Activity: Not on file  Stress: Not on file  Social Connections: Not on file   Past Surgical History:  Procedure Laterality Date   BIOPSY  04/03/2020   Procedure: BIOPSY;  Surgeon: Dolores Frame, MD;  [facility];  Service: Gastroenterology;;  antral, body, distal esophagus, mid esophagus   BRACHIOPLASTY Bilateral 07/02/2020   Procedure: BILATERAL BRACHIOPLASTY WITH LIPOSUCTION;  Surgeon: [name], MD;  Location: [facility];  Service: Plastics;  Laterality: Bilateral;   DILATION AND CURETTAGE OF UTERUS  2022   ESOPHAGOGASTRODUODENOSCOPY (EGD) WITH PROPOFOL N/A 04/03/2020   Procedure: ESOPHAGOGASTRODUODENOSCOPY (EGD) WITH PROPOFOL;  Surgeon: Dolores Frame, MD;  [facility]: [facility];  Service: Gastroenterology;  Laterality: N/A;  11:15   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   LIPOSUCTION Bilateral 07/02/2020   Procedure: LIPOSUCTION;  Surgeon: [name], MD;  Location: [facility];  Service: Plastics;  Laterality: Bilateral;   PANNICULECTOMY N/A 08/04/2019   Procedure: PANNICULECTOMY;  Surgeon: [name], MD;  Location: [facility];  Service: Plastics;   Laterality: N/A;  2 hours only, please   TEE WITHOUT CARDIOVERSION N/A 10/07/2022   Procedure: TRANSESOPHAGEAL ECHOCARDIOGRAM (TEE);  Surgeon: Meriam Sprague, MD;  Location: [facility];  Service: Cardiovascular;  Laterality: N/A;   Past Medical History:  Diagnosis Date   Allergy    Anemia    Anxiety    Arthritis    arthritis knees   Blood transfusion without reported diagnosis    GERD (gastroesophageal reflux disease)    Hypothyroidism    Neuromuscular disorder (HCC)    Obesity (BMI 30-39.9) 01/05/2019   BP 106/73   Pulse 79   Ht 5\' 5"  (1.651 m)   Wt 178 lb (80.7 kg)   SpO2 100%   BMI 29.62 kg/m   Opioid Risk Score:   Fall Risk Score:  `1  Depression screen [facility] 2/9     01/26/2023    9:42 AM 11/20/2022    2:25 PM 09/29/2022   11:50 AM 08/25/2022    3:42 PM 05/14/2022    2:56 PM 12/25/2021    2:58 PM 05/10/2021    3:35 PM  Depression screen PHQ 2/9  Decreased Interest 0 0 0 1 0 0 0  Down, Depressed, Hopeless 0 0 0 1 0 0 0  PHQ - 2 Score 0 0 0 2 0 0 0  Altered sleeping  0 2 3     Tired, decreased energy  0 1 1     Change in appetite  0 0 1     Feeling bad or failure about yourself   0 0 1     Trouble concentrating  0 1 3     Moving slowly or fidgety/restless  0 1 3     Suicidal thoughts  0 0 0     PHQ-9 Score  0 5 14     Difficult doing work/chores   Not difficult at all Somewhat difficult       Review of Systems  Neurological:  Positive for tremors.       Tingling  Psychiatric/Behavioral:  Positive for confusion.   All other systems reviewed and are negative.     Objective:   Physical Exam   Gen: no distress, normal appearing HEENT: oral mucosa pink and moist, NCAT Cardio: Reg rate Chest: normal effort, normal rate of  breathing Abd: soft, non-distended Ext: no edema Psych: very pleasant, normal affect Skin: intact Neuro: alert and oriented x4, CN 2-12 intact, no dysarthria or aphasia noted Strength 5/5 in b/l UE and LE Sensation intact in b/l UE  and LE to LT FTN intact b/l Musculoskeletal: no joint swelling or tenderness       Assessment & Plan:   1) CVA diagnosed by clinical findings, MRI negative, s/p TKN             -Continue PT/OT             -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

## 2023-01-27 DIAGNOSIS — J0140 Acute pansinusitis, unspecified: Secondary | ICD-10-CM | POA: Insufficient documentation

## 2023-01-27 LAB — HEPATIC FUNCTION PANEL
Alkaline Phosphatase: 84 IU/L (ref 44–121)
Bilirubin Total: 0.4 mg/dL (ref 0.0–1.2)
Total Protein: 6.9 g/dL (ref 6.0–8.5)

## 2023-01-27 NOTE — Assessment & Plan Note (Signed)
Patient presents with headache, sinus pressure, and tingling sensation in the head.  Physical examination reveals fluid in the ears bilaterally, swollen cervical lymph nodes, and sinus tenderness. Plan: Prescribed amoxicillin for sinus infection. Advised patient to increase fluid intake and monitor for signs of fever not well-controlled with over-the-counter ibuprofen or Tylenol. May consider using over-the-counter cold and sinus medications as tolerated for symptom management.

## 2023-01-27 NOTE — Assessment & Plan Note (Signed)
Patient has a history of migraines and is currently experiencing a significant headache related to sinus infection.  At this time it is unclear if possible migraine etiology is also present. Plan: Samples provided for Nurtec for the patient to try. Instructions for proper use provided for patient. Consider prescription for Nurtec for future migraine management if samples are effective.

## 2023-01-28 ENCOUNTER — Other Ambulatory Visit: Payer: Self-pay | Admitting: *Deleted

## 2023-01-28 ENCOUNTER — Other Ambulatory Visit (HOSPITAL_BASED_OUTPATIENT_CLINIC_OR_DEPARTMENT_OTHER): Payer: Self-pay

## 2023-01-28 MED ORDER — ATORVASTATIN CALCIUM 20 MG PO TABS
20.0000 mg | ORAL_TABLET | Freq: Every day | ORAL | 1 refills | Status: DC
  Filled 2023-01-28: qty 90, 90d supply, fill #0

## 2023-01-29 ENCOUNTER — Other Ambulatory Visit: Payer: Self-pay | Admitting: *Deleted

## 2023-01-29 ENCOUNTER — Other Ambulatory Visit (HOSPITAL_BASED_OUTPATIENT_CLINIC_OR_DEPARTMENT_OTHER): Payer: Self-pay

## 2023-01-29 MED ORDER — PANTOPRAZOLE SODIUM 40 MG PO TBEC
40.0000 mg | DELAYED_RELEASE_TABLET | Freq: Every day | ORAL | 0 refills | Status: AC
  Filled 2023-01-29 – 2023-02-25 (×2): qty 30, 30d supply, fill #0

## 2023-02-06 ENCOUNTER — Ambulatory Visit
Admission: RE | Admit: 2023-02-06 | Discharge: 2023-02-06 | Disposition: A | Discharge status: Home / Self Care | Payer: 59 | Source: Ambulatory Visit | Attending: Endocrinology | Admitting: Endocrinology

## 2023-02-06 DIAGNOSIS — E049 Nontoxic goiter, unspecified: Secondary | ICD-10-CM

## 2023-02-06 DIAGNOSIS — E041 Nontoxic single thyroid nodule: Secondary | ICD-10-CM | POA: Diagnosis not present

## 2023-02-09 ENCOUNTER — Other Ambulatory Visit (HOSPITAL_BASED_OUTPATIENT_CLINIC_OR_DEPARTMENT_OTHER): Payer: Self-pay

## 2023-02-16 ENCOUNTER — Other Ambulatory Visit (HOSPITAL_COMMUNITY): Payer: Self-pay

## 2023-02-16 MED ORDER — WEGOVY 1 MG/0.5ML ~~LOC~~ SOAJ
1.0000 mg | SUBCUTANEOUS | 5 refills | Status: DC
  Filled 2023-02-16: qty 2, 28d supply, fill #0

## 2023-02-18 ENCOUNTER — Encounter: Payer: Self-pay | Admitting: Neurology

## 2023-02-20 ENCOUNTER — Ambulatory Visit: Payer: 59 | Admitting: Physical Medicine & Rehabilitation

## 2023-02-23 ENCOUNTER — Other Ambulatory Visit (HOSPITAL_COMMUNITY): Payer: Self-pay

## 2023-02-23 MED ORDER — WEGOVY 1 MG/0.5ML ~~LOC~~ SOAJ
1.0000 mg | SUBCUTANEOUS | 5 refills | Status: DC
  Filled 2023-02-23: qty 2, 28d supply, fill #0

## 2023-02-24 ENCOUNTER — Other Ambulatory Visit: Payer: Self-pay | Admitting: Oncology

## 2023-02-24 ENCOUNTER — Other Ambulatory Visit (HOSPITAL_COMMUNITY): Payer: Self-pay

## 2023-02-24 DIAGNOSIS — Z006 Encounter for examination for normal comparison and control in clinical research program: Secondary | ICD-10-CM

## 2023-02-25 ENCOUNTER — Other Ambulatory Visit (HOSPITAL_COMMUNITY): Payer: Self-pay

## 2023-04-08 ENCOUNTER — Other Ambulatory Visit: Payer: Self-pay

## 2023-04-10 DIAGNOSIS — E039 Hypothyroidism, unspecified: Secondary | ICD-10-CM | POA: Diagnosis not present

## 2023-04-21 ENCOUNTER — Other Ambulatory Visit (HOSPITAL_COMMUNITY): Payer: Self-pay

## 2023-04-21 ENCOUNTER — Inpatient Hospital Stay (HOSPITAL_COMMUNITY)
Admission: AD | Admit: 2023-04-21 | Discharge: 2023-04-21 | Disposition: A | Discharge status: Home / Self Care | Payer: 59 | Attending: Obstetrics and Gynecology | Admitting: Obstetrics and Gynecology

## 2023-04-21 DIAGNOSIS — Z789 Other specified health status: Secondary | ICD-10-CM | POA: Diagnosis not present

## 2023-04-21 DIAGNOSIS — N939 Abnormal uterine and vaginal bleeding, unspecified: Secondary | ICD-10-CM | POA: Diagnosis not present

## 2023-04-21 DIAGNOSIS — Z975 Presence of (intrauterine) contraceptive device: Secondary | ICD-10-CM | POA: Insufficient documentation

## 2023-04-21 LAB — CBC
HCT: 33.2 % — ABNORMAL LOW (ref 36.0–46.0)
Hemoglobin: 10.4 g/dL — ABNORMAL LOW (ref 12.0–15.0)
MCH: 26.7 pg (ref 26.0–34.0)
MCHC: 31.3 g/dL (ref 30.0–36.0)
MCV: 85.1 fL (ref 80.0–100.0)
Platelets: 185 10*3/uL (ref 150–400)
RBC: 3.9 MIL/uL (ref 3.87–5.11)
RDW: 14.5 % (ref 11.5–15.5)
WBC: 3.5 10*3/uL — ABNORMAL LOW (ref 4.0–10.5)
nRBC: 0 % (ref 0.0–0.2)

## 2023-04-21 LAB — POCT PREGNANCY, URINE: Preg Test, Ur: NEGATIVE

## 2023-04-21 MED ORDER — MEGESTROL ACETATE 40 MG PO TABS
ORAL_TABLET | ORAL | 3 refills | Status: DC
  Filled 2023-04-21: qty 90, 75d supply, fill #0

## 2023-04-21 NOTE — MAU Note (Signed)
.  Donna Mccarthy is a 47 y.o. at Unknown here in MAU reporting: Vaginal bleeding. She reports for the past month she has been experiencing heavy vaginal bleeding on and off. She reports she is soaking through maxi pads every hour and passing quarter sized large blood clots. She reports she is also experiencing a constant pain in her lower back and intermittent cramping in the lower part of her abdomen. Last took Excedrin around 1030 today.  She reports she is not pregnant and has not had any intercourse since January or February. She has an IUD currently. Mirena.  LMP: End of August/Unsure Onset of complaint: Ongoing for one month  Pain score:  3/10 lower back 3/10 lower abdomen   Lab orders placed from triage:  POCT Preg

## 2023-04-21 NOTE — MAU Provider Note (Signed)
History     CSN: [id number]  Arrival date and time: 04/21/23 1353   Event Date/Time   First Provider Initiated Contact with Patient 04/21/23 1432      Chief Complaint  Patient presents with   Vaginal Bleeding   Abdominal Pain   Back Pain   HPI  Patient presented to MAU for vaginal bleeding with abdominal pain. UPT was negative. Has only GYN complaint.  Vaginal bleeding that is persistent for several weeks. Notes longer periods since her stroke. Not on any anticoagulation.   OB History   No obstetric history on file.     Past Medical History:  Diagnosis Date   Allergy    Anemia    Anxiety    Arthritis    arthritis knees   Blood transfusion without reported diagnosis    GERD (gastroesophageal reflux disease)    Hypothyroidism    Neuromuscular disorder (HCC)    Obesity (BMI 30-39.9) 01/05/2019    Past Surgical History:  Procedure Laterality Date   BIOPSY  04/03/2020   Procedure: BIOPSY;  Surgeon: Dolores Frame, MD;  [facility];  Service: Gastroenterology;;  antral, body, distal esophagus, mid esophagus   BRACHIOPLASTY Bilateral 07/02/2020   Procedure: BILATERAL BRACHIOPLASTY WITH LIPOSUCTION;  Surgeon: [name], MD;  Location: [facility];  Service: Plastics;  Laterality: Bilateral;   DILATION AND CURETTAGE OF UTERUS  2022   ESOPHAGOGASTRODUODENOSCOPY (EGD) WITH PROPOFOL N/A 04/03/2020   Procedure: ESOPHAGOGASTRODUODENOSCOPY (EGD) WITH PROPOFOL;  Surgeon: Dolores Frame, MD;  [facility];  Service: Gastroenterology;  Laterality: N/A;  11:15   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   LIPOSUCTION Bilateral 07/02/2020   Procedure: LIPOSUCTION;  Surgeon: [name], MD;  Location: [facility];  Service: Plastics;  Laterality: Bilateral;   PANNICULECTOMY N/A 08/04/2019   Procedure: PANNICULECTOMY;  Surgeon: [name], MD;  Location: [facility];  Service:  Plastics;  Laterality: N/A;  2 hours only, please   TEE WITHOUT CARDIOVERSION N/A 10/07/2022   Procedure: TRANSESOPHAGEAL ECHOCARDIOGRAM (TEE);  Surgeon: Meriam Sprague, MD;  Location: [facility];  Service: Cardiovascular;  Laterality: N/A;    Family History  Problem Relation Age of Onset   Hypertension Mother    Crohn's disease Mother    Colon polyps Mother    Hypertension Father        POTS   Stroke Father        TIA   Hyperlipidemia Father    Hypertension Brother    Breast cancer Maternal Grandmother    Diabetes Maternal Grandmother    Heart failure Maternal Grandmother    Thyroid disease Paternal Grandmother    Vitamin D deficiency Paternal Grandmother    Heart failure Paternal Grandfather    Heart attack Paternal Grandfather    Colon cancer Maternal Uncle        dx'd in 70's   Colon cancer Maternal Uncle 58   Breast cancer Maternal Aunt     Social History   Tobacco Use   Smoking status: Never   Smokeless tobacco: Never  Vaping Use   Vaping status: Never Used  Substance Use Topics   Alcohol use: No   Drug use: No    Allergies:  Allergies  Allergen Reactions   Ketorolac Tromethamine Other (See Comments)    "felt like whole body was on fire"   Penicillins Other (See Comments)    Mother said she had a rash  Celebrex [Celecoxib] Itching    itching   Egg-Derived Products Other (See Comments)    Medications Prior to Admission  Medication Sig Dispense Refill Last Dose   aspirin 81 MG chewable tablet Chew 1 tablet (81 mg total) by mouth daily. 108 tablet 0    atorvastatin (LIPITOR) 20 MG tablet Take 1 tablet (20 mg total) by mouth daily. 90 tablet 1    buPROPion (WELLBUTRIN XL) 300 MG 24 hr tablet Take 1 tablet (300 mg total) by mouth daily. 90 tablet 1    butalbital-acetaminophen-caffeine (FIORICET) 50-325-40 MG tablet Take 1 tablet by mouth daily as needed for headache. 10 tablet 1    cholecalciferol (VITAMIN D3) 25 MCG (1000 UNIT) tablet Take 1,000  Units by mouth at bedtime. 2,000 units      diclofenac Sodium (VOLTAREN) 1 % GEL Apply 2 g topically 4 (four) times daily. 350 g 0    fluticasone (FLONASE) 50 MCG/ACT nasal spray Place 2 sprays into both nostrils as needed for allergies.      levonorgestrel (MIRENA, 52 MG,) 20 MCG/DAY IUD Take by intrauterine route.      loratadine (CLARITIN) 10 MG tablet Take 10 mg by mouth daily as needed for allergies.      Magnesium 250 MG TABS Take 250 mg by mouth daily.      pantoprazole (PROTONIX) 40 MG tablet Take 1 tablet (40 mg total) by mouth at bedtime. 30 tablet 0    Rimegepant Sulfate (NURTEC) 75 MG TBDP Take 1 tab at the first sign of a migraine. (Patient not taking: Reported on 01/26/2023) 4 tablet 0    Semaglutide-Weight Management (WEGOVY) 0.5 MG/0.5ML SOAJ Inject 0.5 mg into the skin once a week. 2 mL 5    Semaglutide-Weight Management (WEGOVY) 1 MG/0.5ML SOAJ Inject 1 mg into the skin once a week. (Patient not taking: Reported on 01/26/2023) 2 mL 5    Semaglutide-Weight Management (WEGOVY) 1 MG/0.5ML SOAJ Inject 1 mg into the skin once a week. 2 mL 5    Semaglutide-Weight Management (WEGOVY) 1 MG/0.5ML SOAJ Inject 1 mg into the skin once a week. 2 mL 5    thyroid (NP THYROID) 120 MG tablet Take 1 tablet (120 mg total) by mouth daily on an empty stomach 30 tablet 5    thyroid (NP THYROID) 120 MG tablet Take 1 tablet (120 mg total) by mouth daily on an empty stomach. (Patient not taking: Reported on 01/26/2023) 30 tablet 5    thyroid (NP THYROID) 30 MG tablet Take 1 tablet (30 mg total) by mouth daily on an empty stomach. (Patient not taking: Reported on 01/26/2023) 30 tablet 5    topiramate (TOPAMAX) 25 MG tablet Take 3 tablets (75 mg total) by mouth at bedtime. 90 tablet 0     Review of Systems  Constitutional:  Negative for chills and fever.  HENT:  Negative for congestion and sore throat.   Eyes:  Negative for pain and visual disturbance.  Respiratory:  Negative for cough, chest tightness and  shortness of breath.   Cardiovascular:  Negative for chest pain.  Gastrointestinal:  Negative for abdominal pain, diarrhea, nausea and vomiting.  Endocrine: Negative for cold intolerance and heat intolerance.  Genitourinary:  Negative for dysuria and flank pain.  Musculoskeletal:  Negative for back pain.  Skin:  Negative for rash.  Allergic/Immunologic: Negative for food allergies.  Neurological:  Negative for dizziness and light-headedness.  Psychiatric/Behavioral:  Negative for agitation.    Physical Exam   Blood pressure 115/66,  pulse 88, temperature 98.3 F (36.8 C), temperature source Oral, resp. rate 19, height 5\' 5"  (1.651 m), weight 82.5 kg, SpO2 99%.  Physical Exam Vitals and nursing note reviewed.  Constitutional:      Appearance: Normal appearance.  HENT:     Head: Normocephalic and atraumatic.     Nose: Nose normal.     Mouth/Throat:     Mouth: Mucous membranes are moist.  Eyes:     Conjunctiva/sclera: Conjunctivae normal.     Comments: Pale conjunctiva  Cardiovascular:     Rate and Rhythm: Normal rate.  Pulmonary:     Effort: Pulmonary effort is normal.  Abdominal:     General: Abdomen is flat.     Palpations: Abdomen is soft.  Musculoskeletal:     Cervical back: Normal range of motion.  Skin:    General: Skin is warm.     Capillary Refill: Capillary refill takes less than 2 seconds.  Neurological:     General: No focal deficit present.     Mental Status: She is alert.  Psychiatric:        Mood and Affect: Mood normal.     MAU Course  Procedures  MDM- low  Mild anemia on CBC, no need for immediate intervention.  Assessment and Plan   1. Vaginal bleeding   2. Not currently pregnant    - Discussed having appt with GYN - Given Rx for megace to help with vaginal bleeding until her visit - Shared decision making about megace in the setting of stroke. At this point patient would like to try until she can get into GYN  Allergies as of 04/21/2023        Reactions   Ketorolac Tromethamine Other (See Comments)   "felt like whole body was on fire"   Penicillins Other (See Comments)   Mother said she had a rash   Celebrex [celecoxib] Itching   itching   Compazine [prochlorperazine] Other (See Comments)   Out of body experience   Egg-derived Products Other (See Comments)        Medication List     STOP taking these medications    Wegovy 0.5 MG/0.5ML Soaj Generic drug: Semaglutide-Weight Management   Wegovy 1 MG/0.5ML Soaj Generic drug: Semaglutide-Weight Management       TAKE these medications    aspirin 81 MG chewable tablet Chew 1 tablet (81 mg total) by mouth daily.   atorvastatin 20 MG tablet Commonly known as: LIPITOR Take 1 tablet (20 mg total) by mouth daily.   buPROPion 300 MG 24 hr tablet Commonly known as: Wellbutrin XL Take 1 tablet (300 mg total) by mouth daily.   butalbital-acetaminophen-caffeine 50-325-40 MG tablet Commonly known as: FIORICET Take 1 tablet by mouth daily as needed for headache.   cholecalciferol 25 MCG (1000 UNIT) tablet Commonly known as: VITAMIN D3 Take 1,000 Units by mouth at bedtime. 2,000 units   diclofenac Sodium 1 % Gel Commonly known as: VOLTAREN Apply 2 g topically 4 (four) times daily.   fluticasone 50 MCG/ACT nasal spray Commonly known as: FLONASE Place 2 sprays into both nostrils as needed for allergies.   loratadine 10 MG tablet Commonly known as: CLARITIN Take 10 mg by mouth daily as needed for allergies.   Magnesium 250 MG Tabs Take 250 mg by mouth daily.   megestrol 40 MG tablet Commonly known as: MEGACE Take one tablet (40mg ) three times a day for 3 days, then one tablet (40mg ) twice daily for 3 days, then  one tablet (40mg ) daily until seen by your GYN   Mirena (52 MG) 20 MCG/DAY Iud Generic drug: levonorgestrel Take by intrauterine route.   NP Thyroid 120 MG tablet Generic drug: thyroid Take 1 tablet (120 mg total) by mouth daily on an empty  stomach What changed: Another medication with the same name was removed. Continue taking this medication, and follow the directions you see here.   Nurtec 75 MG Tbdp Generic drug: Rimegepant Sulfate Take 1 tab at the first sign of a migraine.   pantoprazole 40 MG tablet Commonly known as: PROTONIX Take 1 tablet (40 mg total) by mouth at bedtime.   topiramate 25 MG tablet Commonly known as: TOPAMAX Take 3 tablets (75 mg total) by mouth at bedtime.        Isa Rankin [facility] 04/21/2023, 2:32 PM

## 2023-05-01 ENCOUNTER — Other Ambulatory Visit (HOSPITAL_BASED_OUTPATIENT_CLINIC_OR_DEPARTMENT_OTHER): Payer: Self-pay

## 2023-05-01 ENCOUNTER — Other Ambulatory Visit: Payer: Self-pay

## 2023-05-01 ENCOUNTER — Encounter: Payer: Self-pay | Admitting: Family Medicine

## 2023-05-01 DIAGNOSIS — G43909 Migraine, unspecified, not intractable, without status migrainosus: Secondary | ICD-10-CM

## 2023-05-01 MED ORDER — TOPIRAMATE 25 MG PO TABS
75.0000 mg | ORAL_TABLET | Freq: Every day | ORAL | 0 refills | Status: DC
  Filled 2023-05-01: qty 90, 30d supply, fill #0

## 2023-05-14 ENCOUNTER — Other Ambulatory Visit (HOSPITAL_BASED_OUTPATIENT_CLINIC_OR_DEPARTMENT_OTHER): Payer: Self-pay

## 2023-05-14 MED ORDER — THYROID 15 MG PO TABS
15.0000 mg | ORAL_TABLET | Freq: Every day | ORAL | 1 refills | Status: DC
  Filled 2023-05-14 – 2023-05-19 (×6): qty 30, 30d supply, fill #0
  Filled 2023-06-19: qty 30, 30d supply, fill #1

## 2023-05-15 DIAGNOSIS — Z01419 Encounter for gynecological examination (general) (routine) without abnormal findings: Secondary | ICD-10-CM | POA: Diagnosis not present

## 2023-05-15 DIAGNOSIS — Z1231 Encounter for screening mammogram for malignant neoplasm of breast: Secondary | ICD-10-CM | POA: Diagnosis not present

## 2023-05-15 DIAGNOSIS — Z6829 Body mass index (BMI) 29.0-29.9, adult: Secondary | ICD-10-CM | POA: Diagnosis not present

## 2023-05-15 LAB — HM MAMMOGRAPHY

## 2023-05-18 ENCOUNTER — Other Ambulatory Visit (HOSPITAL_COMMUNITY): Payer: Self-pay

## 2023-05-19 ENCOUNTER — Other Ambulatory Visit (HOSPITAL_BASED_OUTPATIENT_CLINIC_OR_DEPARTMENT_OTHER): Payer: Self-pay

## 2023-05-19 ENCOUNTER — Other Ambulatory Visit (HOSPITAL_COMMUNITY): Payer: Self-pay

## 2023-05-19 ENCOUNTER — Other Ambulatory Visit: Payer: Self-pay

## 2023-05-19 NOTE — Patient Instructions (Incomplete)
  HEALTH MAINTENANCE RECOMMENDATIONS:  It is recommended that you get at least 30 minutes of aerobic exercise at least 5 days/week (for weight loss, you may need as much as 60-90 minutes). This can be any activity that gets your heart rate up. This can be divided in 10-15 minute intervals if needed, but try and build up your endurance at least once a week.  Weight bearing exercise is also recommended twice weekly.  Eat a healthy diet with lots of vegetables, fruits and fiber.  "Colorful" foods have a lot of vitamins (ie green vegetables, tomatoes, red peppers, etc).  Limit sweet tea, regular sodas and alcoholic beverages, all of which has a lot of calories and sugar.  Up to 1 alcoholic drink daily may be beneficial for women (unless trying to lose weight, watch sugars).  Drink a lot of water.  Calcium recommendations are 1200-1500 mg daily (1500 mg for postmenopausal women or women without ovaries), and vitamin D 1000 IU daily.  This should be obtained from diet and/or supplements (vitamins), and calcium should not be taken all at once, but in divided doses.  Monthly self breast exams and yearly mammograms for women over the age of 37 is recommended.  Sunscreen of at least SPF 30 should be used on all sun-exposed parts of the skin when outside between the hours of 10 am and 4 pm (not just when at beach or pool, but even with exercise, golf, tennis, and yard work!)  Use a sunscreen that says "broad spectrum" so it covers both UVA and UVB rays, and make sure to reapply every 1-2 hours.  Remember to change the batteries in your smoke detectors when changing your clock times in the spring and fall. Carbon monoxide detectors are recommended for your home.  Use your seat belt every time you are in a car, and please drive safely and not be distracted with cell phones and texting while driving.  You can re-try the atorvastatin and take coenzyme Q10 daily along with it. If you cannot tolerate it still ,let  us know and we can try switching you to rosuvastatin (crestor). If IUD is removed, remember that you must use condoms to prevent pregnancy while taking a statin medication.  Try taking either Prilosec OTC or OTC Nexium in place of the prescription pantoprazole. You can further step down to Pepcid AC (famotidine), taken prior to triggering meals (or twice daily if needed) if you are able to. The lowest effective dose at treating/preventing reflux is the goal. H2 blockers (pepcid) are a little safer than the PPI (protonix/prilosec, etc).  If you have your other hepatitis B vaccines dates, send them to [place] to update your chart.  Please call your GI and arrange for a colonoscopy for colon cancer screening.

## 2023-05-19 NOTE — Progress Notes (Unsigned)
No chief complaint on file.  Donna Mccarthy is a 47 y.o. female who presents for a complete physical.  She is under the care of Donna Mccarthy for GYN care.  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 She had IUD replaced in 10/2021.  She had previously used norethindrone prn for heavy bleeding (rarely needed this).  She went to ER 04/21/23 with vaginal bleeding x weeks. She was treated with Megace to reduce bleeding until she saw her GYN. Prior Hgb had been 11.6 and 12.2 in May 2024. Hgb had dropped to 10.4. She has known fibroids ([place] 06/2021), and neg EMB (06/2021) per GYN notes.  Lab Results  Component Value Date   WBC 3.5 (L) 04/21/2023   HGB 10.4 (L) 04/21/2023   HCT 33.2 (L) 04/21/2023   MCV 85.1 04/21/2023   PLT 185 04/21/2023   SEEN OB SINCE??  09/2022 she was hospitalized with slurred speech, blurred vision, diploplia, left facial droop. 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 No abnormal findings on CT, MRI/MRA. Questionable MCA and PCA narrowing noted on CTA of head/neck.  She was started on atorvastatin. Imitrex was stopped, rx'd fioricet instead for use prn migraines. (She had been advised to discuss with neuro, to consider Nurtec or Ubrelvy for prn use). Donna Mccarthy has since given her Nurtec (12/2022). She continues on ASA 81 mg daily.   Component Ref Range & Units 3 mo ago 7 mo ago 1 yr ago 3 yr ago 4 yr ago  Cholesterol, Total 100 - 199 mg/dL 161  096 045 409  Triglycerides 0 - 149 mg/dL 51 28 R 44 30 50  HDL >81 mg/dL 51 40 Low  R 66 57 48  VLDL Cholesterol Cal 5 - 40 mg/dL 11  10 8 11   LDL Chol Calc (NIH) 0 - 99 mg/dL 89  84 81 90  LDL CALC COMMENT: CANCELED      Comment: Test not performed  Result canceled by the ancillary.  Chol/HDL Ratio 0.0 - 4.4 ratio 3.0 3.2 R 2.4 CM 2.6 CM 3.1 CM      She had ER visit 01/14/2023 for left-sided facial droop with slurred speech and  right-sided weakness. She had negative CT, brain MRI/MRA.  Symptoms and headache resolved. 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     Migraines: takes topamax 75mg  daily for prevention.  Last year was getting migraines about 3x/month. Imitrex is effective. Last year--+work stress (new clinic, urology), son is going to be a dad/patient will be a grandma.   UPDATE  Hypothyroidism: managed by Donna Mccarthy.    Previously, when taking 120mg  Mon through Fri, 240 on Sat/Sun, TSH was normal on this regimen (last year).  Normal thyroid US in 01/2023.  Lab Results  Component Value Date   TSH 0.015 (L) 10/08/2022    Vitamin D deficiency: last level was low in 21.7 in 08/2022, when she was taking 1000 IU daily. She was treated with 12 weeks of prescription vitamin D, and was advised to increase her daily supplement to 2000 IU upon completion of the rx.  She is currently taking  Component Ref Range & Units 8 mo ago 1 yr ago 2 yr ago 3 yr ago  Vit D, 25-Hydroxy 30.0 - 100.0 ng/mL 21.7 Low  25.8 Low  CM 35.9 CM 26.8 Low  CM    Depression and Anxiety:  started on Wellbutrin in  08/2022. At her f/u visit in February, she was feeling much better. PHQ-9 decreased from 14 to 5.  GAD-7 improved from 7 to 3.     Immunization History  Administered Date(s) Administered   Hepatitis B 05/29/2016   Influenza,inj,Quad PF,6+ Mos 05/28/2020   Influenza-Unspecified 05/23/2021, 05/18/2022   PFIZER(Purple Top)SARS-COV-2 Vaccination 10/18/2019, 11/11/2019   Tdap 08/31/2011, 08/25/2022  Last pap: 05/2021 (result not in chart--abnl, had LEEP 08/2021) Last mammo: 10/2021 through GYN (report not received). DEXA: never Colonoscopy: never; uncle with colon cancer, mom with colon polyps. Has GI in  Dentist: twice yearly Ophtho: goes yearly Exercise:  Tries to walk 15-20 minutes at work.   Vitamin D-OH screen normal, 35.9 in 04/2021 Lipid screen: Lab Results  Component Value  Date   CHOL 151 01/26/2023   HDL 51 01/26/2023   LDLCALC 89 01/26/2023   TRIG 51 01/26/2023   CHOLHDL 3.0 01/26/2023     PMH, PSH, SH and FH were reviewed and updated     ROS: The patient denies anorexia, fever, vision changes, decreased hearing, ear pain, sore throat, breast concerns, chest pain, palpitations, dizziness, syncope, dyspnea on exertion, cough, swelling, nausea, vomiting, diarrhea, constipation, abdominal pain, melena, hematochezia, indigestion/heartburn, hematuria, incontinence, dysuria, vaginal discharge, odor or itch, genital lesions, joint pains, numbness, tingling, weakness, tremor, suspicious skin lesions, depression, anxiety, abnormal bleeding/bruising, or enlarged lymph nodes.  Migraines per HPI. Irregular spotting, no heavy periods. LUQ pain and pain between her shoulder blades per HPI Draining lesion on R face +weight gain, related to changes in thyroid More acne Moods--+stress    PHYSICAL EXAM:  There were no vitals taken for this visit.  Wt Readings from Last 3 Encounters:  04/21/23 181 lb 12.8 oz (82.5 kg)  01/26/23 178 lb (80.7 kg)  01/14/23 178 lb 2.1 oz (80.8 kg)   General Appearance:    Alert, cooperative, no distress, appears stated age  Head:    Normocephalic, without obvious abnormality, atraumatic  Eyes:    PERRL, conjunctiva/corneas clear, EOM's intact  Ears:    Normal TM's and external ear canals  Nose:   Normal, no drainage or sinus tenderness  Throat:   Normal mucosa, no lesions  Neck:   Supple, no lymphadenopathy;  thyroid:  no enlargement/ tenderness/nodules; no JVD  Back:    Nno CVA tenderness. Tender at lower thoracic spine and very tender over L paraspinous muscle (thoracic)--tender laterally around rib, to the lower ribs on the L  Lungs:     Clear to auscultation bilaterally without wheezes, rales or ronchi; respirations unlabored  Chest Wall:    No tenderness or deformity   Heart:    Regular rate and rhythm, S1 and S2 normal,  no murmur, rub   or gallop  Breast Exam:    Deferred to OB/GYN   Abdomen:     Soft, non-tender, nondistended, normoactive bowel sounds,    no masses, no hepatosplenomegaly  Genitalia:    Deferred to OB/GYN        Extremities:   No clubbing, cyanosis or edema  Pulses:   2+ and symmetric all extremities  Skin:   Skin color, texture, turgor normal, no rashes or lesions. Skin is normal, without lesions, along the pain at the L chest wall. Mole R cheek--in central portion there is slight ulceration with serosanguinous oozing/crust. No surrounding erythema, warmth or STS.  Lymph nodes:   Cervical, supraclavicular nodes normal  Neurologic:   CNII-XII intact, normal strength, sensation and gait  Psych:   Normal mood, affect, hygiene and grooming.                 01/26/2023    9:42 AM 11/20/2022    2:25 PM 09/29/2022   11:50 AM 08/25/2022    3:42 PM 05/14/2022    2:56 PM  Depression screen PHQ 2/9  Decreased Interest 0 0 0 1 0  Down, Depressed, Hopeless 0 0 0 1 0  PHQ - 2 Score 0 0 0 2 0  Altered sleeping  0 2 3   Tired, decreased energy  0 1 1   Change in appetite  0 0 1   Feeling bad or failure about yourself   0 0 1   Trouble concentrating  0 1 3   Moving slowly or fidgety/restless  0 1 3   Suicidal thoughts  0 0 0   PHQ-9 Score  0 5 14   Difficult doing work/chores   Not difficult at all Somewhat difficult       09/29/2022   11:49 AM 08/25/2022    3:42 PM 12/25/2021    2:58 PM  GAD 7 : Generalized Anxiety Score  Nervous, Anxious, on Edge 1 2 2   Control/stop worrying 0 1 1  Worry too much - different things 0 0 1  Trouble relaxing 1 1 3   Restless 1 1 2   Easily annoyed or irritable 0 1 3  Afraid - awful might happen 0 1 0  Total GAD 7 Score 3 7 12   Anxiety Difficulty Not difficult at all Somewhat difficult Somewhat difficult      ASSESSMENT/PLAN:   Phq-9, gad-7 Flu shot Offer/decline COVID  Colonoscopy vs cologuard  Has she seen/scheduled  with OB since ER visit in September for vaginal bleeding? Need GYN results/notes--no mammos since 2021. Last pap?? Mammo? *CBC today if she hasn't seen GYN and had Hgb repeated since ER visit.  Any visits/notes/labs from Donna Mccarthy?  I see 01/2023 thyroid US.  Did she see Talmage Mccarthy, labs?  Is she taking atorvastatin?  LDL not significantly different than prior to starting it in 09/2022, per last check in June.  Taking wellbutrin? Need RF?   Discussed monthly self breast exams and yearly mammograms; at least 30 minutes of aerobic activity at least 5 days/week, weight-bearing exercise at least 2x/week; proper sunscreen use reviewed; healthy diet, including goals of calcium and vitamin D intake and alcohol recommendations (less than or equal to 1 drink/day) reviewed; regular seatbelt use; changing batteries in smoke detectors.  Immunization recommendations discussed--continue yearly flu shots. COVID booster recommended.   Colonoscopy recommendations reviewed, due now (age 8)  F/u 1 year, sooner prn.

## 2023-05-20 ENCOUNTER — Ambulatory Visit (INDEPENDENT_AMBULATORY_CARE_PROVIDER_SITE_OTHER): Payer: No Typology Code available for payment source | Admitting: Family Medicine

## 2023-05-20 ENCOUNTER — Encounter: Payer: Self-pay | Admitting: Family Medicine

## 2023-05-20 VITALS — BP 102/70 | HR 72 | Ht 65.0 in | Wt 182.0 lb

## 2023-05-20 DIAGNOSIS — Z1211 Encounter for screening for malignant neoplasm of colon: Secondary | ICD-10-CM | POA: Diagnosis not present

## 2023-05-20 DIAGNOSIS — F321 Major depressive disorder, single episode, moderate: Secondary | ICD-10-CM

## 2023-05-20 DIAGNOSIS — Z130 Encounter for screening for diseases of the blood and blood-forming organs and certain disorders involving the immune mechanism: Secondary | ICD-10-CM | POA: Diagnosis not present

## 2023-05-20 DIAGNOSIS — E039 Hypothyroidism, unspecified: Secondary | ICD-10-CM | POA: Diagnosis not present

## 2023-05-20 DIAGNOSIS — Z0000 Encounter for general adult medical examination without abnormal findings: Secondary | ICD-10-CM | POA: Diagnosis not present

## 2023-05-20 DIAGNOSIS — N920 Excessive and frequent menstruation with regular cycle: Secondary | ICD-10-CM | POA: Diagnosis not present

## 2023-05-20 DIAGNOSIS — Z7185 Encounter for immunization safety counseling: Secondary | ICD-10-CM | POA: Diagnosis not present

## 2023-05-20 DIAGNOSIS — G43909 Migraine, unspecified, not intractable, without status migrainosus: Secondary | ICD-10-CM

## 2023-05-20 DIAGNOSIS — Z8673 Personal history of transient ischemic attack (TIA), and cerebral infarction without residual deficits: Secondary | ICD-10-CM

## 2023-05-20 DIAGNOSIS — E559 Vitamin D deficiency, unspecified: Secondary | ICD-10-CM | POA: Diagnosis not present

## 2023-05-20 DIAGNOSIS — K219 Gastro-esophageal reflux disease without esophagitis: Secondary | ICD-10-CM

## 2023-05-20 DIAGNOSIS — F419 Anxiety disorder, unspecified: Secondary | ICD-10-CM

## 2023-05-20 DIAGNOSIS — Z23 Encounter for immunization: Secondary | ICD-10-CM

## 2023-05-21 ENCOUNTER — Other Ambulatory Visit: Payer: Self-pay

## 2023-05-21 LAB — VITAMIN D 25 HYDROXY (VIT D DEFICIENCY, FRACTURES): Vit D, 25-Hydroxy: 32.6 ng/mL (ref 30.0–100.0)

## 2023-05-21 MED ORDER — ACCRUFER 30 MG PO CAPS
30.0000 mg | ORAL_CAPSULE | Freq: Every day | ORAL | 1 refills | Status: DC
  Filled 2023-05-21 (×2): qty 30, 30d supply, fill #0
  Filled 2023-08-25: qty 30, 30d supply, fill #1

## 2023-05-22 ENCOUNTER — Other Ambulatory Visit: Payer: Self-pay

## 2023-05-25 ENCOUNTER — Other Ambulatory Visit: Payer: Self-pay

## 2023-05-25 ENCOUNTER — Encounter: Payer: Self-pay | Admitting: *Deleted

## 2023-06-10 DIAGNOSIS — N920 Excessive and frequent menstruation with regular cycle: Secondary | ICD-10-CM | POA: Diagnosis not present

## 2023-06-12 ENCOUNTER — Other Ambulatory Visit: Payer: Self-pay

## 2023-06-12 ENCOUNTER — Other Ambulatory Visit: Payer: Self-pay | Admitting: Family Medicine

## 2023-06-12 DIAGNOSIS — G43909 Migraine, unspecified, not intractable, without status migrainosus: Secondary | ICD-10-CM

## 2023-06-12 MED ORDER — TOPIRAMATE 25 MG PO TABS
75.0000 mg | ORAL_TABLET | Freq: Every day | ORAL | 0 refills | Status: DC
Start: 2023-06-12 — End: 2023-08-06
  Filled 2023-06-12: qty 90, 30d supply, fill #0

## 2023-06-19 ENCOUNTER — Other Ambulatory Visit: Payer: Self-pay

## 2023-06-30 ENCOUNTER — Other Ambulatory Visit: Payer: Self-pay | Admitting: Neurology

## 2023-06-30 ENCOUNTER — Encounter: Payer: Self-pay | Admitting: Neurology

## 2023-06-30 ENCOUNTER — Other Ambulatory Visit: Payer: Self-pay

## 2023-06-30 NOTE — Telephone Encounter (Signed)
Yes, she can take both

## 2023-07-01 ENCOUNTER — Other Ambulatory Visit: Payer: Self-pay

## 2023-07-03 DIAGNOSIS — Z30433 Encounter for removal and reinsertion of intrauterine contraceptive device: Secondary | ICD-10-CM | POA: Diagnosis not present

## 2023-07-06 ENCOUNTER — Other Ambulatory Visit: Payer: Self-pay

## 2023-07-06 MED ORDER — SUMATRIPTAN SUCCINATE 50 MG PO TABS
50.0000 mg | ORAL_TABLET | ORAL | 6 refills | Status: DC | PRN
  Filled 2023-07-06: qty 10, 30d supply, fill #0
  Filled 2023-11-18: qty 10, 30d supply, fill #1

## 2023-08-03 DIAGNOSIS — E039 Hypothyroidism, unspecified: Secondary | ICD-10-CM | POA: Diagnosis not present

## 2023-08-04 ENCOUNTER — Other Ambulatory Visit: Payer: Self-pay

## 2023-08-04 MED ORDER — THYROID 30 MG PO TABS
30.0000 mg | ORAL_TABLET | Freq: Every day | ORAL | 5 refills | Status: DC
  Filled 2023-08-04: qty 30, 30d supply, fill #0
  Filled 2023-09-11 (×2): qty 30, 30d supply, fill #1
  Filled 2023-11-16: qty 30, 30d supply, fill #2

## 2023-08-04 MED ORDER — THYROID 120 MG PO TABS
120.0000 mg | ORAL_TABLET | Freq: Every day | ORAL | 0 refills | Status: DC
  Filled 2023-08-04: qty 30, 30d supply, fill #0

## 2023-08-06 ENCOUNTER — Other Ambulatory Visit: Payer: Self-pay

## 2023-08-06 ENCOUNTER — Other Ambulatory Visit: Payer: Self-pay | Admitting: Family Medicine

## 2023-08-06 DIAGNOSIS — G43909 Migraine, unspecified, not intractable, without status migrainosus: Secondary | ICD-10-CM

## 2023-08-06 MED ORDER — TOPIRAMATE 25 MG PO TABS
75.0000 mg | ORAL_TABLET | Freq: Every day | ORAL | 0 refills | Status: DC
Start: 2023-08-06 — End: 2023-09-16
  Filled 2023-08-06: qty 90, 30d supply, fill #0

## 2023-08-10 DIAGNOSIS — N926 Irregular menstruation, unspecified: Secondary | ICD-10-CM | POA: Diagnosis not present

## 2023-08-10 DIAGNOSIS — N920 Excessive and frequent menstruation with regular cycle: Secondary | ICD-10-CM | POA: Diagnosis not present

## 2023-08-10 DIAGNOSIS — G4700 Insomnia, unspecified: Secondary | ICD-10-CM | POA: Diagnosis not present

## 2023-08-19 DIAGNOSIS — E063 Autoimmune thyroiditis: Secondary | ICD-10-CM

## 2023-08-19 HISTORY — DX: Autoimmune thyroiditis: E06.3

## 2023-08-25 ENCOUNTER — Other Ambulatory Visit: Payer: Self-pay

## 2023-09-08 ENCOUNTER — Encounter (INDEPENDENT_AMBULATORY_CARE_PROVIDER_SITE_OTHER): Payer: Self-pay | Admitting: Physician Assistant

## 2023-09-08 ENCOUNTER — Ambulatory Visit (INDEPENDENT_AMBULATORY_CARE_PROVIDER_SITE_OTHER): Payer: Commercial Managed Care - PPO | Admitting: Physician Assistant

## 2023-09-08 VITALS — BP 108/70 | HR 79 | Temp 98.2°F | Ht 65.0 in | Wt 189.0 lb

## 2023-09-08 DIAGNOSIS — K219 Gastro-esophageal reflux disease without esophagitis: Secondary | ICD-10-CM | POA: Diagnosis not present

## 2023-09-08 DIAGNOSIS — G43909 Migraine, unspecified, not intractable, without status migrainosus: Secondary | ICD-10-CM

## 2023-09-08 DIAGNOSIS — F419 Anxiety disorder, unspecified: Secondary | ICD-10-CM

## 2023-09-08 DIAGNOSIS — E039 Hypothyroidism, unspecified: Secondary | ICD-10-CM | POA: Diagnosis not present

## 2023-09-08 DIAGNOSIS — F321 Major depressive disorder, single episode, moderate: Secondary | ICD-10-CM | POA: Diagnosis not present

## 2023-09-08 DIAGNOSIS — Z0289 Encounter for other administrative examinations: Secondary | ICD-10-CM

## 2023-09-08 DIAGNOSIS — Z6831 Body mass index (BMI) 31.0-31.9, adult: Secondary | ICD-10-CM | POA: Diagnosis not present

## 2023-09-08 DIAGNOSIS — G4700 Insomnia, unspecified: Secondary | ICD-10-CM

## 2023-09-08 DIAGNOSIS — Z789 Other specified health status: Secondary | ICD-10-CM

## 2023-09-08 DIAGNOSIS — E559 Vitamin D deficiency, unspecified: Secondary | ICD-10-CM

## 2023-09-08 DIAGNOSIS — E66811 Obesity, class 1: Secondary | ICD-10-CM | POA: Diagnosis not present

## 2023-09-08 DIAGNOSIS — D649 Anemia, unspecified: Secondary | ICD-10-CM

## 2023-09-08 DIAGNOSIS — Z8673 Personal history of transient ischemic attack (TIA), and cerebral infarction without residual deficits: Secondary | ICD-10-CM

## 2023-09-08 NOTE — Progress Notes (Signed)
Office: [phone]  /  Fax: [phone]   Initial Visit  Donna Mccarthy was seen in clinic today to evaluate for obesity. She is interested in losing weight to improve overall health and reduce the risk of weight related complications. She presents today to review program treatment options, initial physical assessment, and evaluation.     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 She states: Adopt healthier eating patterns, Improve energy levels and physical activity, Improve existing medical conditions, Reduce number of medications, Improve quality of life, Improve appearance, Improve self-confidence, and Lose a target amount of weight : goal weight of 150  lbs in 6-9 months.  Weight history: Peak weight of 308 lbs. Lost weight with low carb and increased activity to nadir weight of 169 lbs.     Started to regain after had CVA 10/03/2022 felt to be related to Covid 19.   When asked how has your weight affected you? She states: Has affected self-esteem, Contributed to medical problems, Contributed to orthopedic problems or mobility issues, Having fatigue, Having poor endurance, Problems with eating patterns, and Has affected mood   Some associated conditions: Arthritis:knees, GERD, Vitamin D Deficiency, and Other: CVA in 2024  Contributing factors: Family history of obesity, Consumption of processed foods, Moderate to high levels of stress, Reduced physical activity, Eating patterns, Slow metabolism for age, Frequent travel, and Enticing relationships and enviroment  Weight promoting medications identified: None  Current nutrition plan: Vegetarian and Other: pescatrian  Current level of physical activity: None and Walking 15-30 minutes  Current or previous pharmacotherapy: Other: Endocrinology attempted to obtain [facility], but not covered  Response to medication: Never  tried medications   Past medical history includes:   Past Medical History:  Diagnosis Date   Allergy    Anemia    Anxiety    Arthritis    arthritis knees   Blood transfusion without reported diagnosis    GERD (gastroesophageal reflux disease)    Hypothyroidism    Neuromuscular disorder (HCC)    Obesity (BMI 30-39.9) 01/05/2019     Objective:   BP 108/70   Pulse 79   Temp 98.2 F (36.8 C)   Ht 5\' 5"  (1.651 m)   Wt 189 lb (85.7 kg)   SpO2 100%   BMI 31.45 kg/m  She was weighed on the bioimpedance scale: Body mass index is 31.45 kg/m.  Peak Weight:308 lbs , Body Fat%:40.2, Visceral Fat Rating:9, Weight trend over the last 12 months: Increasing  General:  Alert, oriented and cooperative. Patient is in no acute distress.  Respiratory: Normal respiratory effort, no problems with respiration noted   Gait: able to ambulate independently  Mental Status: Normal mood and affect. Normal behavior. Normal judgment and thought content.   DIAGNOSTIC DATA REVIEWED:  BMET    Component Value Date/Time   NA 140 01/14/2023 1119   NA 143 05/15/2022 0845   K 3.9 01/14/2023 1119   CL 107 01/14/2023 1119   CO2 20 (L) 01/14/2023 1110   GLUCOSE 88 01/14/2023 1119   BUN 6 01/14/2023 1119   BUN 9 05/15/2022 0845   CREATININE 0.70 01/14/2023 1119   CALCIUM 8.9 01/14/2023 1110   GFRNONAA >60 01/14/2023 1110   GFRAA 129 04/27/2020 1503   Lab Results  Component Value Date   HGBA1C 5.0 10/04/2022   HGBA1C 5.4 04/20/2019   No results found  for: "INSULIN" CBC    Component Value Date/Time   WBC 3.5 (L) 04/21/2023 1543   RBC 3.90 04/21/2023 1543   HGB 10.4 (L) 04/21/2023 1543   HGB 11.9 10/29/2022 1445   HCT 33.2 (L) 04/21/2023 1543   HCT 36.1 10/29/2022 1445   PLT 185 04/21/2023 1543   PLT 252 10/29/2022 1445   MCV 85.1 04/21/2023 1543   MCV 81 10/29/2022 1445   MCH 26.7 04/21/2023 1543   MCHC 31.3 04/21/2023 1543   RDW 14.5 04/21/2023 1543   RDW 14.8 10/29/2022 1445    Iron/TIBC/Ferritin/ %Sat    Component Value Date/Time   IRON 93 04/27/2020 1503   TIBC 297 04/27/2020 1503   FERRITIN 47 04/27/2020 1503   IRONPCTSAT 31 04/27/2020 1503   Lipid Panel     Component Value Date/Time   CHOL 151 01/26/2023 0836   TRIG 51 01/26/2023 0836   HDL 51 01/26/2023 0836   CHOLHDL 3.0 01/26/2023 0836   CHOLHDL 3.2 10/04/2022 0244   VLDL 6 10/04/2022 0244   LDLCALC 89 01/26/2023 0836   Hepatic Function Panel     Component Value Date/Time   PROT 6.9 01/26/2023 0836   ALBUMIN 4.2 01/26/2023 0836   AST 21 01/26/2023 0836   ALT 27 01/26/2023 0836   ALKPHOS 84 01/26/2023 0836   BILITOT 0.4 01/26/2023 0836   BILIDIR 0.13 01/26/2023 0836   IBILI NOT CALCULATED 10/24/2019 1604      Component Value Date/Time   TSH 0.015 (L) 10/08/2022 0817   TSH 1.400 08/25/2022 1633     Assessment and Plan:   History of CVA (cerebrovascular accident)  Vitamin D deficiency  Depression, major, single episode, moderate (HCC)  Migraine without status migrainosus, not intractable, unspecified migraine type  Gastroesophageal reflux disease without esophagitis  Hypothyroidism, unspecified type  Anxiety  Vegetarian  Insomnia, unspecified type  Anemia, unspecified type  Class 1 obesity due to excess calories with serious comorbidity and body mass index (BMI) of 31.0 to 31.9 in adult        Obesity Treatment / Action Plan:  Patient will work on garnering support from family and friends to begin weight loss journey. Will work on eliminating or reducing the presence of highly palatable, calorie dense foods in the home. Will complete provided nutritional and psychosocial assessment questionnaire before the next appointment. Will be scheduled for indirect calorimetry to determine resting energy expenditure in a fasting state.  This will allow [place] to create a reduced calorie, high-protein meal plan to promote loss of fat mass while preserving muscle mass. Will  think about ideas on how to incorporate physical activity into their daily routine. Will avoid skipping meals which may result in increased hunger signals and overeating at certain times. Will reduce the frequency of eating out and making healthier choices by advanced menu planning. Counseled on the health benefits of losing 5%-15% of total body weight. Will work on improving sleep hygiene and trying to obtain at least 7 hours of sleep. Was counseled on nutritional approaches to weight loss and benefits of reducing processed foods and consuming plant-based foods and high quality protein as part of nutritional weight management. Was counseled on pharmacotherapy and role as an adjunct in weight management.   Obesity Education Performed Today:  She was weighed on the bioimpedance scale and results were discussed and documented in the synopsis.  We discussed obesity as a disease and the importance of a more detailed evaluation of all the factors contributing to the disease.  We discussed the importance of long term lifestyle changes which include nutrition, exercise and behavioral modifications as well as the importance of customizing this to her specific health and social needs.  We discussed the benefits of reaching a healthier weight to alleviate the symptoms of existing conditions and reduce the risks of the biomechanical, metabolic and psychological effects of obesity.  [place] appears to be in the action stage of change and states they are ready to start intensive lifestyle modifications and behavioral modifications.  30 minutes was spent today on this visit including the above counseling, pre-visit chart review, and post-visit documentation.  Reviewed by clinician on day of visit: allergies, medications, problem list, medical history, surgical history, family history, social history, and previous encounter notes pertinent to obesity diagnosis.   Apple Dearmas,PA-C

## 2023-09-11 ENCOUNTER — Other Ambulatory Visit: Payer: Self-pay

## 2023-09-16 ENCOUNTER — Other Ambulatory Visit: Payer: Self-pay

## 2023-09-16 ENCOUNTER — Other Ambulatory Visit: Payer: Self-pay | Admitting: Family Medicine

## 2023-09-16 DIAGNOSIS — G43909 Migraine, unspecified, not intractable, without status migrainosus: Secondary | ICD-10-CM

## 2023-09-16 DIAGNOSIS — F321 Major depressive disorder, single episode, moderate: Secondary | ICD-10-CM

## 2023-09-16 MED ORDER — TOPIRAMATE 25 MG PO TABS
75.0000 mg | ORAL_TABLET | Freq: Every day | ORAL | 2 refills | Status: DC
  Filled 2023-09-16: qty 270, 90d supply, fill #0

## 2023-09-16 MED ORDER — BUPROPION HCL ER (XL) 300 MG PO TB24
300.0000 mg | ORAL_TABLET | Freq: Every day | ORAL | 2 refills | Status: DC
  Filled 2023-09-16: qty 90, 90d supply, fill #0
  Filled 2024-01-07: qty 90, 90d supply, fill #1
  Filled 2024-04-27: qty 90, 90d supply, fill #2

## 2023-09-30 ENCOUNTER — Other Ambulatory Visit: Payer: Self-pay

## 2023-09-30 ENCOUNTER — Other Ambulatory Visit (HOSPITAL_COMMUNITY): Payer: Self-pay

## 2023-09-30 DIAGNOSIS — E049 Nontoxic goiter, unspecified: Secondary | ICD-10-CM | POA: Diagnosis not present

## 2023-09-30 DIAGNOSIS — N926 Irregular menstruation, unspecified: Secondary | ICD-10-CM | POA: Diagnosis not present

## 2023-09-30 DIAGNOSIS — E669 Obesity, unspecified: Secondary | ICD-10-CM | POA: Diagnosis not present

## 2023-09-30 DIAGNOSIS — E039 Hypothyroidism, unspecified: Secondary | ICD-10-CM | POA: Diagnosis not present

## 2023-09-30 MED ORDER — THYROID 120 MG PO TABS
120.0000 mg | ORAL_TABLET | Freq: Every day | ORAL | 5 refills | Status: DC
  Filled 2023-10-19: qty 30, 30d supply, fill #0

## 2023-09-30 MED ORDER — ACCRUFER 30 MG PO CAPS
1.0000 | ORAL_CAPSULE | Freq: Every day | ORAL | 1 refills | Status: DC
  Filled 2023-09-30 – 2023-10-05 (×2): qty 30, 30d supply, fill #0
  Filled 2023-11-16: qty 30, 30d supply, fill #1

## 2023-09-30 MED ORDER — THYROID 30 MG PO TABS
30.0000 mg | ORAL_TABLET | Freq: Every day | ORAL | 5 refills | Status: DC
  Filled 2023-10-19: qty 30, 30d supply, fill #0

## 2023-10-01 ENCOUNTER — Ambulatory Visit (INDEPENDENT_AMBULATORY_CARE_PROVIDER_SITE_OTHER): Payer: Commercial Managed Care - PPO | Admitting: Family Medicine

## 2023-10-01 ENCOUNTER — Encounter (INDEPENDENT_AMBULATORY_CARE_PROVIDER_SITE_OTHER): Payer: Self-pay | Admitting: Family Medicine

## 2023-10-01 VITALS — BP 111/71 | HR 60 | Temp 97.8°F | Ht 65.0 in | Wt 191.0 lb

## 2023-10-01 DIAGNOSIS — Z8673 Personal history of transient ischemic attack (TIA), and cerebral infarction without residual deficits: Secondary | ICD-10-CM

## 2023-10-01 DIAGNOSIS — F321 Major depressive disorder, single episode, moderate: Secondary | ICD-10-CM | POA: Diagnosis not present

## 2023-10-01 DIAGNOSIS — F5089 Other specified eating disorder: Secondary | ICD-10-CM | POA: Diagnosis not present

## 2023-10-01 DIAGNOSIS — E559 Vitamin D deficiency, unspecified: Secondary | ICD-10-CM

## 2023-10-01 DIAGNOSIS — Z6831 Body mass index (BMI) 31.0-31.9, adult: Secondary | ICD-10-CM

## 2023-10-01 DIAGNOSIS — E66811 Obesity, class 1: Secondary | ICD-10-CM | POA: Diagnosis not present

## 2023-10-01 DIAGNOSIS — R5383 Other fatigue: Secondary | ICD-10-CM | POA: Diagnosis not present

## 2023-10-01 DIAGNOSIS — R0602 Shortness of breath: Secondary | ICD-10-CM | POA: Diagnosis not present

## 2023-10-01 DIAGNOSIS — Z1331 Encounter for screening for depression: Secondary | ICD-10-CM | POA: Diagnosis not present

## 2023-10-01 DIAGNOSIS — E039 Hypothyroidism, unspecified: Secondary | ICD-10-CM
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
## 2023-10-02 LAB — CBC WITH DIFFERENTIAL/PLATELET
Basophils Absolute: 0 10*3/uL (ref 0.0–0.2)
Basos: 1 %
EOS (ABSOLUTE): 0.1 10*3/uL (ref 0.0–0.4)
Eos: 2 %
Hematocrit: 37.4 % (ref 34.0–46.6)
Hemoglobin: 11.5 g/dL (ref 11.1–15.9)
Immature Grans (Abs): 0 10*3/uL (ref 0.0–0.1)
Immature Granulocytes: 0 %
Lymphocytes Absolute: 1.9 10*3/uL (ref 0.7–3.1)
Lymphs: 46 %
MCH: 26.5 pg — ABNORMAL LOW (ref 26.6–33.0)
MCHC: 30.7 g/dL — ABNORMAL LOW (ref 31.5–35.7)
MCV: 86 fL (ref 79–97)
Monocytes Absolute: 0.3 10*3/uL (ref 0.1–0.9)
Monocytes: 8 %
Neutrophils Absolute: 1.8 10*3/uL (ref 1.4–7.0)
Neutrophils: 43 %
Platelets: 208 10*3/uL (ref 150–450)
RBC: 4.34 x10E6/uL (ref 3.77–5.28)
RDW: 13.9 % (ref 11.7–15.4)
WBC: 4 10*3/uL (ref 3.4–10.8)

## 2023-10-02 LAB — COMPREHENSIVE METABOLIC PANEL
ALT: 16 [IU]/L (ref 0–32)
AST: 19 [IU]/L (ref 0–40)
Albumin: 4.3 g/dL (ref 3.9–4.9)
Alkaline Phosphatase: 76 [IU]/L (ref 44–121)
BUN/Creatinine Ratio: 8 — ABNORMAL LOW (ref 9–23)
BUN: 7 mg/dL (ref 6–24)
Bilirubin Total: 0.8 mg/dL (ref 0.0–1.2)
CO2: 20 mmol/L (ref 20–29)
Calcium: 9.4 mg/dL (ref 8.7–10.2)
Chloride: 105 mmol/L (ref 96–106)
Creatinine, Ser: 0.83 mg/dL (ref 0.57–1.00)
Globulin, Total: 2.8 g/dL (ref 1.5–4.5)
Glucose: 83 mg/dL (ref 70–99)
Potassium: 4.5 mmol/L (ref 3.5–5.2)
Sodium: 141 mmol/L (ref 134–144)
Total Protein: 7.1 g/dL (ref 6.0–8.5)
eGFR: 88 mL/min/{1.73_m2} (ref >59)

## 2023-10-02 LAB — LIPID PANEL
Chol/HDL Ratio: 2.3 {ratio} (ref 0.0–4.4)
Cholesterol, Total: 175 mg/dL (ref 100–199)
HDL: 76 mg/dL (ref >39)
LDL Chol Calc (NIH): 92 mg/dL (ref 0–99)
Triglycerides: 31 mg/dL (ref 0–149)
VLDL Cholesterol Cal: 7 mg/dL (ref 5–40)

## 2023-10-02 LAB — FOLATE: Folate: 7.1 ng/mL (ref >3.0)

## 2023-10-02 LAB — HEMOGLOBIN A1C
Est. average glucose Bld gHb Est-mCnc: 105 mg/dL
Hgb A1c MFr Bld: 5.3 % (ref 4.8–5.6)

## 2023-10-02 LAB — T3: T3, Total: 92 ng/dL (ref 71–180)

## 2023-10-02 LAB — INSULIN, RANDOM: INSULIN: 6.3 u[IU]/mL (ref 2.6–24.9)

## 2023-10-02 LAB — VITAMIN D 25 HYDROXY (VIT D DEFICIENCY, FRACTURES): Vit D, 25-Hydroxy: 28.8 ng/mL — ABNORMAL LOW (ref 30.0–100.0)

## 2023-10-02 LAB — VITAMIN B12: Vitamin B-12: 814 pg/mL (ref 232–1245)

## 2023-10-02 LAB — T4, FREE: Free T4: 0.64 ng/dL — ABNORMAL LOW (ref 0.82–1.77)

## 2023-10-02 LAB — TSH: TSH: 28.1 u[IU]/mL — ABNORMAL HIGH (ref 0.450–4.500)

## 2023-10-05 ENCOUNTER — Other Ambulatory Visit: Payer: Self-pay

## 2023-10-06 ENCOUNTER — Other Ambulatory Visit: Payer: Self-pay

## 2023-10-19 ENCOUNTER — Telehealth (INDEPENDENT_AMBULATORY_CARE_PROVIDER_SITE_OTHER): Payer: Commercial Managed Care - PPO | Admitting: Psychology

## 2023-10-19 ENCOUNTER — Other Ambulatory Visit (HOSPITAL_BASED_OUTPATIENT_CLINIC_OR_DEPARTMENT_OTHER): Payer: Self-pay

## 2023-10-19 ENCOUNTER — Ambulatory Visit (INDEPENDENT_AMBULATORY_CARE_PROVIDER_SITE_OTHER): Payer: Commercial Managed Care - PPO | Admitting: Family Medicine

## 2023-10-19 ENCOUNTER — Encounter (INDEPENDENT_AMBULATORY_CARE_PROVIDER_SITE_OTHER): Payer: Self-pay | Admitting: Family Medicine

## 2023-10-19 ENCOUNTER — Other Ambulatory Visit: Payer: Self-pay

## 2023-10-19 VITALS — BP 104/68 | HR 68 | Temp 98.1°F | Ht 65.0 in | Wt 182.0 lb

## 2023-10-19 DIAGNOSIS — Z8673 Personal history of transient ischemic attack (TIA), and cerebral infarction without residual deficits: Secondary | ICD-10-CM

## 2023-10-19 DIAGNOSIS — E88819 Insulin resistance, unspecified: Secondary | ICD-10-CM

## 2023-10-19 DIAGNOSIS — Z6830 Body mass index (BMI) 30.0-30.9, adult: Secondary | ICD-10-CM

## 2023-10-19 DIAGNOSIS — F5089 Other specified eating disorder: Secondary | ICD-10-CM | POA: Diagnosis not present

## 2023-10-19 DIAGNOSIS — E559 Vitamin D deficiency, unspecified: Secondary | ICD-10-CM

## 2023-10-19 DIAGNOSIS — E039 Hypothyroidism, unspecified: Secondary | ICD-10-CM

## 2023-10-19 DIAGNOSIS — F419 Anxiety disorder, unspecified: Secondary | ICD-10-CM

## 2023-10-19 DIAGNOSIS — E66811 Obesity, class 1: Secondary | ICD-10-CM

## 2023-10-19 DIAGNOSIS — F321 Major depressive disorder, single episode, moderate: Secondary | ICD-10-CM | POA: Diagnosis not present

## 2023-10-19 DIAGNOSIS — E6609 Other obesity due to excess calories: Secondary | ICD-10-CM

## 2023-10-19 MED ORDER — VITAMIN D (ERGOCALCIFEROL) 1.25 MG (50000 UNIT) PO CAPS
50000.0000 [IU] | ORAL_CAPSULE | ORAL | 0 refills | Status: DC
Start: 2023-10-19 — End: 2023-11-19
  Filled 2023-10-19 – 2023-10-20 (×2): qty 4, 28d supply, fill #0

## 2023-10-19 NOTE — Progress Notes (Signed)
 [name], D.O.  ABFM, ABOM Clinical Bariatric Medicine Physician  Office located at: [address] [place], [place]  [address]     Assessment and Plan:   FOR THE DISEASE OF OBESITY: BMI 30.0-30.9,adult - Current BMI 30.29 Class 1 obesity starting BMI 31.78 date/10/01/23 Assessment & Plan: Since last office visit on 10/01/23 patient's muscle mass has decreased by 1.6 lb. Fat mass has decreased by 7.4 lb. Total body water has decreased by 5.2 lb.  Counseling done on how various foods will affect these numbers and how to maximize success  Total lbs lost to date: 9 lbs Total weight loss percentage to date: -4.71%     Recommended Dietary Goals Donna Mccarthy is currently in the action stage of change. As such, her goal is to continue weight management plan.  She has agreed to: continue current plan   Behavioral Intervention We discussed the following today: increasing lean protein intake to established goals, decreasing simple carbohydrates , increasing vegetables, increasing lower glycemic fruits, increasing water intake , work on managing stress, creating time for self-care and relaxation, continue to work on implementation of reduced calorie nutritional plan, continue to practice mindfulness when eating, and focusing on food with a 10:1 ratio of calories: grams of protein  Additional resources provided today:  Handout on Insulin Resistance  Evidence-based interventions for health behavior change were utilized today including the discussion of self monitoring techniques, problem-solving barriers and SMART goal setting techniques.  Regarding patient's less desirable eating habits and patterns, we employed the technique of small changes.   Pt will specifically work on: n/a   Recommended Physical Activity Goals Donna Mccarthy has been advised to work up to 150 minutes of moderate intensity aerobic activity a week and strengthening exercises 2-3 times per week for cardiovascular health,  weight loss maintenance and preservation of muscle mass.   She has agreed to :  Think about enjoyable ways to increase daily physical activity and overcoming barriers to exercise and Increase physical activity in their day and reduce sedentary time (increase NEAT).   Pharmacotherapy We discussed various medication options to help Vance with her weight loss efforts and we both agreed to : continue with nutritional and behavioral strategies and adequate clinical response to current dose, continue current regimen   FOR ASSOCIATED CONDITIONS ADDRESSED TODAY:  Depression, major, single episode, moderate (HCC)/Emotional Eating Assessment & Plan: Moods stable currently. Pt is on Wellbutrin Xl 300 mg once daily. Good compliance and tolerance reported. No adverse side effects. Pt is in EAP. She has improved her sleep hygiene and her sleep quality has improved as a result. She is now sleeping 7-9 hours per night on average.   Continue with EAP and with current medication regimen as prescribed. Encouraged pt to continue good sleep hygiene and stress management to improve condition. Will continue to monitor condition as it relates to her weight loss journey.    Hypothyroidism, unspecified type Assessment & Plan: Lab Results  Component Value Date   TSH 28.100 (H) 10/01/2023   T3TOTAL 92 10/01/2023   TSH quite elevated at 28.1. Established with Dr. Talmage Nap of endocrinology. Pt is symptomatic with increased fatigue, sluggishness, and hair loss. Reviewed with patient how labs are typically checked 6-8 weeks after any med changes; these labs were just a few weeks earlier. Continue to follow up with Dr. Romero Belling in near future for medication management. Will continue to monitor condition alongside PCP/ endo care team as it pertains to her overall health and weight  loss.    Vitamin D deficiency Assessment & Plan: Lab Results  Component Value Date   VD25OH 28.8 (L) 10/01/2023   VD25OH 32.6 05/20/2023    VD25OH 21.7 (L) 08/25/2022   Last vitamin D was sub-optimal at 28.8 on 10/01/23. Pt is currently taking Vit D OTC 2,000 IUs daily. Has been on ERGO in the past.   Ideal vitamin D levels of 50-70 reviewed with pt. Reviewed the benefits of optimal vitamin D levels with moods, energy, and weight loss. We mutually agreed pt will stop daily vitamin D and will start ERGO 50K units once weekly. We will continue to monitor her condition and recheck labs every 3-4 months to keep levels within normal limits and prevent over supplementation.   Orders: - Start ERGO 50,000 units once weekly   History of CVA (cerebrovascular accident) Assessment & Plan: Lab Results  Component Value Date   CHOL 175 10/01/2023   HDL 76 10/01/2023   LDLCALC 92 10/01/2023   TRIG 31 10/01/2023   CHOLHDL 2.3 10/01/2023   Cholesterol components are WNL. No acute concerns.  Pt is on Lipitor 20 mg daily, managed by Dr. Lynelle Doctor.   Reviewed ideal LDL of 70 or less given her hx of CVA. Encouraged pt to avoid fatty meats, saturated/trans fats, and decrease simple carbs/sugars via her prudent nutritional meal plan. Continue to prioritize protein intake and low calorie fruits and non-starchy vegetables. I recommend she discuss with her PCP about the option of increasing her Lipitor dosage to lower her LDL below 70. Recommended she go to [employer] to read more on this condition. Will continue to monitor condition as it relates to her weight loss journey.    Insulin resistance :  Assessment & Plan: Lab Results  Component Value Date   HGBA1C 5.3 10/01/2023   HGBA1C 5.0 10/04/2022   HGBA1C 5.4 04/20/2019   INSULIN 6.3 10/01/2023   Most recent A1C is 5.3 and fasting insulin was 6.3. No meds current. Diet/exercise approach. She has been prioritizing her protein intake since starting the program. CBC completely stable.   I counseled patient on pathophysiology of the disease process of insulin resistance. Stressed  importance of dietary and lifestyle modifications to result in weight loss as first line treatment. Continue to decrease simple carbs/ sugars; increase fiber and proteins -> follow her meal plan.  Handouts provided at pt's request after education provided.  All concerns/questions addressed.  Donna Mccarthy will continue to work on weight loss, exercise, via their meal plan we devised to help decrease the risk of progressing to diabetes. Drink at least half her body weight in ounces of water per day, especially when exercising. Encouraged pt to visit the American Diabetes Association for more information if pt desires. We will recheck A1c and fasting insulin level in approximately 3-4 months from last check, or as deemed appropriate.    FOLLOW UP:   Return in about 2 weeks (around 11/02/2023) for f/u with Shawn Rayburn PA on 3/17. Schedule another appt with Dr. Val Eagle around 3/31.Marland Kitchen She was informed of the importance of frequent follow up visits to maximize her success with intensive lifestyle modifications for her multiple health conditions.  Subjective:   Chief complaint: Obesity Donna Mccarthy is here to discuss her progress with her obesity treatment plan. She is on the [employer] and states she is following her eating plan approximately 80-90 % of the time. She states she is exercising 20-30 minutes 2 days per week and doing PT 15 minutes.  Interval History:  Donna Mccarthy is here today for her first follow-up office visit since starting the program with [place]. Patient is off to a good start and has lost 9 lbs. She recently ate a beyond steak for dinner last night, portioned out to 1 cup for dinner. She has been more mindful of her calorie and protein intake. Prefers boiled eggs rather than fried eggs. She also eats non-dairy cottage cheese and an apple for breakfast. Has been avoiding shrimp, was unsure if it was part of her MP.   All blood work/ lab tests that were recently ordered by myself or an outside  provider were reviewed with patient today per their request. Extended time was spent counseling her on all new disease processes that were discovered or preexisting ones that are affected by BMI.  she understands that many of these abnormalities will need to monitored regularly along with the current treatment plan of prudent dietary changes, in which we are making each and every office visit, to improve these health parameters.  Pharmacotherapy for weight loss: She is currently taking Topamax and Wellbutrin for medical weight loss.  Denies side effects.    Review of Systems:  Pertinent positives were addressed with patient today.  Reviewed by clinician on day of visit: allergies, medications, problem list, medical history, surgical history, family history, social history, and previous encounter notes.   Weight Summary and Biometrics   Weight Lost Since Last Visit: 9 lb  Weight Gained Since Last Visit: 0   Vitals Temp: 98.1 F (36.7 C) BP: 104/68 Pulse Rate: 68 SpO2: 100 %   Anthropometric Measurements Height: 5\' 5"  (1.651 m) Weight: 182 lb (82.6 kg) BMI (Calculated): 30.29 Weight at Last Visit: 191 lb Weight Lost Since Last Visit: 9 lb Weight Gained Since Last Visit: 0 Starting Weight: 191 lb Total Weight Loss (lbs): 9 lb (4.082 kg) Peak Weight: 306 lb   Body Composition  Body Fat %: 39.1 % Fat Mass (lbs): 71.2 lbs Muscle Mass (lbs): 105.4 lbs Total Body Water (lbs): 72.6 lbs Visceral Fat Rating : 8   Other Clinical Data Fasting: No Labs: No Today's Visit #: 2 Starting Date: 10/01/23     Objective:   PHYSICAL EXAM:  Blood pressure 104/68, pulse 68, temperature 98.1 F (36.7 C), height 5\' 5"  (1.651 m), weight 182 lb (82.6 kg), SpO2 100%. Body mass index is 30.29 kg/m.  General: she is overweight, cooperative and in no acute distress.   HEENT: EOMI, sclerae are anicteric. Lungs: Normal breathing effort, no conversational dyspnea. M-Sk:  Normal gross  ROM * 4 extremities  PSYCH: Has normal mood, affect and thought process. Neurologic: No gross sensory or motor deficits. Well developed, A and O * 3  DIAGNOSTIC DATA REVIEWED:  BMET    Component Value Date/Time   NA 141 10/01/2023 0916   K 4.5 10/01/2023 0916   CL 105 10/01/2023 0916   CO2 20 10/01/2023 0916   GLUCOSE 83 10/01/2023 0916   GLUCOSE 88 01/14/2023 1119   BUN 7 10/01/2023 0916   CREATININE 0.83 10/01/2023 0916   CALCIUM 9.4 10/01/2023 0916   GFRNONAA >60 01/14/2023 1110   GFRAA 129 04/27/2020 1503   Lab Results  Component Value Date   HGBA1C 5.3 10/01/2023   HGBA1C 5.4 04/20/2019   Lab Results  Component Value Date   INSULIN 6.3 10/01/2023   Lab Results  Component Value Date   TSH 28.100 (H) 10/01/2023   CBC    Component Value Date/Time  WBC 4.0 10/01/2023 0916   WBC 3.5 (L) 04/21/2023 1543   RBC 4.34 10/01/2023 0916   RBC 3.90 04/21/2023 1543   HGB 11.5 10/01/2023 0916   HCT 37.4 10/01/2023 0916   PLT 208 10/01/2023 0916   MCV 86 10/01/2023 0916   MCH 26.5 (L) 10/01/2023 0916   MCH 26.7 04/21/2023 1543   MCHC 30.7 (L) 10/01/2023 0916   MCHC 31.3 04/21/2023 1543   RDW 13.9 10/01/2023 0916   Iron Studies    Component Value Date/Time   IRON 93 04/27/2020 1503   TIBC 297 04/27/2020 1503   FERRITIN 47 04/27/2020 1503   IRONPCTSAT 31 04/27/2020 1503   Lipid Panel     Component Value Date/Time   CHOL 175 10/01/2023 0916   TRIG 31 10/01/2023 0916   HDL 76 10/01/2023 0916   CHOLHDL 2.3 10/01/2023 0916   CHOLHDL 3.2 10/04/2022 0244   VLDL 6 10/04/2022 0244   LDLCALC 92 10/01/2023 0916   Hepatic Function Panel     Component Value Date/Time   PROT 7.1 10/01/2023 0916   ALBUMIN 4.3 10/01/2023 0916   AST 19 10/01/2023 0916   ALT 16 10/01/2023 0916   ALKPHOS 76 10/01/2023 0916   BILITOT 0.8 10/01/2023 0916   BILIDIR 0.13 01/26/2023 0836   IBILI NOT CALCULATED 10/24/2019 1604      Component Value Date/Time   TSH 28.100 (H) 10/01/2023  0916   Nutritional Lab Results  Component Value Date   VD25OH 28.8 (L) 10/01/2023   VD25OH 32.6 05/20/2023   VD25OH 21.7 (L) 08/25/2022    Attestations:   Burnett Sheng , acting as a medical scribe for Thomasene Lot, DO., have compiled all relevant documentation for today's office visit on behalf of Thomasene Lot, DO, while in the presence of [employer], DO.  Reviewed by clinician on day of visit: allergies, medications, problem list, medical history, surgical history, family history, social history, and previous encounter notes pertinent to patient's obesity diagnosis.  I have reviewed the above documentation for accuracy and completeness, and I agree with the above. [name], D.O.  The 21st Century Cures Act was signed into law in 2016 which includes the topic of electronic health records.  This provides immediate access to information in MyChart.  This includes consultation notes, operative notes, office notes, lab results and pathology reports.  If you have any questions about what you read please let us know at your next visit so we can discuss your concerns and take corrective action if need be.  We are right here with you.
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
## 2023-10-20 ENCOUNTER — Other Ambulatory Visit (HOSPITAL_BASED_OUTPATIENT_CLINIC_OR_DEPARTMENT_OTHER): Payer: Self-pay

## 2023-10-20 ENCOUNTER — Other Ambulatory Visit: Payer: Self-pay

## 2023-11-02 ENCOUNTER — Encounter (INDEPENDENT_AMBULATORY_CARE_PROVIDER_SITE_OTHER): Payer: Self-pay | Admitting: Physician Assistant

## 2023-11-02 ENCOUNTER — Ambulatory Visit (INDEPENDENT_AMBULATORY_CARE_PROVIDER_SITE_OTHER): Payer: Commercial Managed Care - PPO | Admitting: Physician Assistant

## 2023-11-02 VITALS — BP 98/59 | HR 76 | Temp 98.2°F | Ht 65.0 in | Wt 182.0 lb

## 2023-11-02 DIAGNOSIS — E039 Hypothyroidism, unspecified: Secondary | ICD-10-CM | POA: Diagnosis not present

## 2023-11-02 DIAGNOSIS — E88819 Insulin resistance, unspecified: Secondary | ICD-10-CM | POA: Diagnosis not present

## 2023-11-02 DIAGNOSIS — E66811 Obesity, class 1: Secondary | ICD-10-CM

## 2023-11-02 DIAGNOSIS — Z6830 Body mass index (BMI) 30.0-30.9, adult: Secondary | ICD-10-CM | POA: Diagnosis not present

## 2023-11-02 DIAGNOSIS — E559 Vitamin D deficiency, unspecified: Secondary | ICD-10-CM | POA: Diagnosis not present

## 2023-11-02 DIAGNOSIS — M5431 Sciatica, right side: Secondary | ICD-10-CM | POA: Diagnosis not present

## 2023-11-02 NOTE — Progress Notes (Signed)
 SUBJECTIVE: Discussed the use of AI scribe software for clinical note transcription with the patient, who gave verbal consent to proceed.  Chief Complaint: Obesity  Interim History: She has maintained since her last visit.  Down 9 lbs TBW loss of 4.7% since 10/01/23  Donna Mccarthy is here to discuss her progress with her obesity treatment plan. She is on the [employer] and states she is following her eating plan approximately 80-90 % of the time. She states she is exercising walking 20-40 minutes 3-5 times per week. Donna Mccarthy is a 48 year old female who presents for follow-up of her obesity treatment plan.  She has successfully lost nine pounds, attributing her success to the meal plan, although she sometimes finds it monotonous. She experiments with different meal options to maintain interest, primarily consuming salads and vegetables for lunch, often incorporating fish or tuna, and exploring various breakfast options that align with her pescetarian diet.  She has a history of insulin resistance and vitamin D deficiency. Her current medications include thyroid medications, which were not adjusted after a recent TSH test showed elevated levels due to premature testing and concurrent iron intake affecting absorption. She plans to have her TSH rechecked.  She experiences pain over her right SI joint with radiation down her leg, which started over the weekend. She has been using heat, ice, ibuprofen, and Voltaren gel for relief. 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  She has a history of emotional eating behaviors and is working on managing these through dietary adjustments and support from her family. She experiences cravings, particularly at night, and manages them by consuming fruits like blueberries. She avoids sweets and is cautious about her diet, especially when dining out with family.  She is a CMA float, which involves  significant physical activity, contributing to her exercise routine. She is physically active, walking frequently during work and on breaks, but reports back pain, possibly from overexertion. She reports having energy at the end of the day despite her busy schedule.  OBJECTIVE: Visit Diagnoses: Problem List Items Addressed This Visit     Hypothyroidism   Vitamin D deficiency   Other Visit Diagnoses       Insulin resistance    -  Primary     Sciatica of right side       Relevant Orders   Ambulatory referral to Sports Medicine     Class 1 obesity starting BMI 31.78 date/10/01/23         BMI 30.0-30.9,adult          Obesity Obesity management with successful weight loss of nine pounds. Effective meal plan with some boredom due to repetitive meals. Pescetarian diet includes salads, vegetables, and fish. Regular physical activity, including walking. Manages nighttime cravings with healthier options like blueberries. - Continue current meal plan with variations to prevent boredom - Incorporate [place] yogurt bars and outshine fruit bars as healthier snack options - Encourage regular physical activity, including walking - Discuss strategies to manage cravings, such as healthier snack alternatives  Insulin Resistance Last fasting insulin was 6.3- slightly above goal. A1c was 5.3- at goal. Polyphagia:No Medication(s): None Lab Results  Component Value Date   HGBA1C 5.3 10/01/2023   HGBA1C 5.0 10/04/2022   HGBA1C 5.4 04/20/2019   Lab Results  Component Value Date   INSULIN 6.3 10/01/2023    Plan:  Continue working on nutrition plan to decrease simple carbohydrates, increase lean proteins and  exercise to promote weight loss, improve glycemic control and prevent progression to Type 2 diabetes.     Right SI joint pain with sciatic symptoms New onset right SI joint pain with sciatic symptoms, radiating down the leg on the side affected by a previous stroke. Currently using heat, ice,  ibuprofen, and Voltaren gel for pain management. Suspected SI joint dysfunction. - Refer to sports medicine for evaluation and potential treatment, including possible injections-  - Continue using heat, ice, ibuprofen, and Voltaren gel (2-4 grams, 3-4 times daily) - Consider physical therapy if recommended by sports medicine  Vitamin D Deficiency Vitamin D is not at goal of 50.  Most recent vitamin D level was 28.8. She is on  prescription ergocalciferol 50,000 IU weekly. Lab Results  Component Value Date   VD25OH 28.8 (L) 10/01/2023   VD25OH 32.6 05/20/2023   VD25OH 21.7 (L) 08/25/2022    Plan: Continue  prescription ergocalciferol 50,000 IU weekly No refill needed this visit.  Low vitamin D levels can be associated with adiposity and may result in leptin resistance and weight gain. Also associated with fatigue.  Currently on vitamin D supplementation without any adverse effects such as nausea, vomiting or muscle weakness.  Recheck vitamin D levels several times yearly to optimize supplementation/avoid over supplementation.    Hypothyroidism Previously elevated TSH due to premature testing and iron supplementation affecting absorption. Currently on stable thyroid medication (120 and 130 mcg) as per endocrinologist's recommendation- Dr. Talmage Nap. - Continue current thyroid medication regimen - Recheck TSH levels at a later date as per endocrinologist's recommendation  Vitals Temp: 98.2 F (36.8 C) BP: (!) 98/59 Pulse Rate: 76 SpO2: 99 %   Anthropometric Measurements Height: 5\' 5"  (1.651 m) Weight: 182 lb (82.6 kg) BMI (Calculated): 30.29 Weight at Last Visit: 182 lb Weight Lost Since Last Visit: 0 Weight Gained Since Last Visit: 0 Starting Weight: 191 lb Total Weight Loss (lbs): 9 lb (4.082 kg) Peak Weight: 306 lb   Body Composition  Body Fat %: 40.1 % Fat Mass (lbs): 73 lbs Muscle Mass (lbs): 103.4 lbs Total Body Water (lbs): 77.6 lbs Visceral Fat Rating :  9   Other Clinical Data RMR: [id number] Fasting: no Labs: no Today's Visit #: 3 Starting Date: 10/01/23     ASSESSMENT AND PLAN:  Diet: Donna Mccarthy is currently in the action stage of change. As such, her goal is to continue with weight loss efforts and has agreed to the [employer].   Exercise:  All adults should avoid inactivity. Some activity is better than none, and adults who participate in any amount of physical activity, gain some health benefits. and For substantial health benefits, adults should do at least 150 minutes (2 hours and 30 minutes) a week of moderate-intensity, or 75 minutes (1 hour and 15 minutes) a week of vigorous-intensity aerobic physical activity, or an equivalent combination of moderate- and vigorous-intensity aerobic activity. Aerobic activity should be performed in episodes of at least 10 minutes, and preferably, it should be spread throughout the week.  Behavior Modification:  We discussed the following Behavioral Modification Strategies today: increasing lean protein intake, decreasing simple carbohydrates, increasing vegetables, increase H2O intake, increase high fiber foods, better snacking choices, avoiding temptations, and planning for success. We discussed various medication options to help Donna Mccarthy with her weight loss efforts and we both agreed to continue to work on nutritional and behavioral strategies to promote weight loss.  .  Return in about 2 weeks (around 11/16/2023).Marland Kitchen She was informed of  the importance of frequent follow up visits to maximize her success with intensive lifestyle modifications for her multiple health conditions.  Attestation Statements:   Reviewed by clinician on day of visit: allergies, medications, problem list, medical history, surgical history, family history, social history, and previous encounter notes.   Time spent on visit including pre-visit chart review and post-visit care and charting was 39  minutes  Tyke Outman,PA-C

## 2023-11-03 ENCOUNTER — Ambulatory Visit: Admitting: Family Medicine

## 2023-11-03 ENCOUNTER — Other Ambulatory Visit (HOSPITAL_COMMUNITY): Payer: Self-pay

## 2023-11-03 ENCOUNTER — Encounter: Payer: Self-pay | Admitting: Family Medicine

## 2023-11-03 VITALS — BP 117/67 | HR 68 | Ht 65.0 in | Wt 186.1 lb

## 2023-11-03 DIAGNOSIS — M545 Low back pain, unspecified: Secondary | ICD-10-CM | POA: Insufficient documentation

## 2023-11-03 DIAGNOSIS — M533 Sacrococcygeal disorders, not elsewhere classified: Secondary | ICD-10-CM | POA: Insufficient documentation

## 2023-11-03 DIAGNOSIS — M5441 Lumbago with sciatica, right side: Secondary | ICD-10-CM | POA: Diagnosis not present

## 2023-11-03 MED ORDER — PREDNISONE 10 MG PO TABS
10.0000 mg | ORAL_TABLET | Freq: Two times a day (BID) | ORAL | 0 refills | Status: DC
  Filled 2023-11-03 – 2023-11-04 (×2): qty 10, 5d supply, fill #0

## 2023-11-03 NOTE — Progress Notes (Signed)
   Donna Mccarthy     MRN: [id number]      DOB: [birth year]  Chief Complaint  Patient presents with   Acute Visit    Back Pain Started Friday night. Has been hard to sit, stand, walk and lay down pain stemming from tailbone down rt.hip into leg     HPI Donna Mccarthy is here stating that since 5 days ago she started experiencing discomfort in  right lower hip joint area, has worsened, unable  to sleep last night due to pain  radiated down aterior rigth thigh to knee Walking sitting lying are all uncomfortable feels unsteady when walking , has to hold on, ANY DIRECT PRESSURE TO THE AREA IS PAINFUL, NO FEVER, CHILLS , DRainAGE OR BOIL ROS See HPI  PE  BP 117/67   Pulse 68   Ht 5\' 5"  (1.651 m)   Wt 186 lb 1.9 oz (84.4 kg)   SpO2 96%   BMI 30.97 kg/m   Patient alert and oriented and in no cardiopulmonary distress.Patient in pain, sitting uncomfortably easing pressure off of right buttock  HEENT: No facial asymmetry, EOMI,     Neck supple .  Chest: Clear to auscultation bilaterally.  CVS: S1, S2 no murmurs, Ext: No edema  WU:[id number]  ROM lumbar spine, tender over right SI joint normal rOM shoulders, and knees.  Skin: Intact, no ulcerations or rash noted.No erythema , warmth or swelling over area of pain  Psych: Good eye contact, normal affect. Memory intact not anxious or depressed appearing.  CNS: CN 2-12 intact, power,  normal throughout.no focal deficits noted.   Assessment & Plan  Acute right-sided low back pain Needs urgent ortho eval and managemanent  Work note to return =this Friday but Ortho decides [Pred 10 mg   Sacroiliac joint pain New problem x 5 days ansd is disabling, work excuse and Ortho eval today

## 2023-11-03 NOTE — Patient Instructions (Addendum)
 You need to see Orthopedics today for help,go to Orthopedics urgent care as soon as able today  You have acute right sacro iliac joint pain radiating down to right knee which is debilitating  Work excuse from today March 18 ( afternoon) to return 11/06/2023, however decision is being turned over to Orthopedics once  you establish with them if different from this plan   I have prescribed a 5 day course of prednisone   Thanks for choosing [facility], we consider it a privelige to serve you.

## 2023-11-03 NOTE — Assessment & Plan Note (Addendum)
 Needs urgent ortho eval and managemanent  Work note to return =this Friday but Ortho decides [Pred 10 mg

## 2023-11-03 NOTE — Assessment & Plan Note (Signed)
 New problem x 5 days ansd is disabling, work excuse and Ortho eval today

## 2023-11-04 ENCOUNTER — Encounter: Payer: Self-pay | Admitting: Physician Assistant

## 2023-11-04 ENCOUNTER — Other Ambulatory Visit: Payer: Self-pay

## 2023-11-04 ENCOUNTER — Other Ambulatory Visit (HOSPITAL_COMMUNITY): Payer: Self-pay

## 2023-11-04 ENCOUNTER — Ambulatory Visit: Admitting: Physician Assistant

## 2023-11-04 DIAGNOSIS — M545 Low back pain, unspecified: Secondary | ICD-10-CM

## 2023-11-04 NOTE — Progress Notes (Signed)
 Office Visit Note   Patient: Donna Mccarthy           Date of Birth: 19-Feb-1976           MRN: [id number] Visit Date: 11/04/2023              Requested by: [name], MD [address] [place],  [place] [address] PCP: [name], MD   Assessment & Plan: Visit Diagnoses:  1. 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  She comes in today with a 5-day history of pain in her right lower buttock that shoots down the top of her leg.  She denies any lower back pain she says she has been walking more at some of her jobs was not sure if this made things worse.  Last night she did get an episode of cramping in her right foot.  She is not sure if this is secondary to how she is walking.  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  She also saw her primary care provider yesterday.  Her primary care provider thought this was more secondary to her back.  She has tried exercises on her own.  I would have to agree I think this is more right sided low back pain.  I would like her to go forward and take the Medrol dose pack that was prescribed to her.  I also offered her PT versus home directed exercises she would like to try the home directed exercises will contact me if she does not improve also was told if things get worse could order an MRI.  Today she has good strength no radicular findings she does have some focal tenderness in the right posterior buttock  Follow-Up Instructions: No follow-ups on file.   Orders:  Orders Placed This Encounter  Procedures   XR Lumbar Spine 2-3 Views   No orders of the defined types were placed in this encounter.     Procedures: No procedures performed   Clinical Data: No additional findings.   Subjective: Chief Complaint  Patient presents with   Lower Back - Pain    Pain in the lower right back since Friday and became worse  pain in the buttok area to the thigh     HPI pleasant 48 year old woman comes in today with a 5-day history of right posterior buttock pain that radiates down the front of her leg.  No injury.  No previous history denies any loss of bowel or bladder control has tried doing some stretching she saw her primary care yesterday who prescribed a Medrol Dosepak but wanted her to contact us first  Review of Systems  All other systems reviewed and are negative.    Objective: Vital Signs: There were no vitals taken for this visit.  Physical Exam Constitutional:      Appearance: Normal appearance.  Pulmonary:     Effort: Pulmonary effort is normal.  Skin:    General: Skin is warm and dry.  Neurological:     General: No focal deficit present.     Mental Status: She is alert and oriented to person, place, and time.  Psychiatric:        Mood and Affect: Mood normal.        Behavior: Behavior normal.     Ortho Exam Examination she does have some stiffness when  sitting to standing.  She has some focal tenderness to palpation in the posterior buttock.  She does have radiation of pain down her leg with straight leg raising.  She has strong dorsiflexion plantarflexion extension flexion of her legs.  No paresthesias currently.  No pain with manipulation of her hip Specialty Comments:  No specialty comments available.  Imaging: No results found.   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/08/2022   CVA (cerebral vascular accident) (HCC) 10/03/2022   Stroke determined by clinical assessment (HCC) 10/03/2022   Dysphagia 03/29/2020   Nausea with vomiting 03/29/2020   Vitamin D deficiency 02/02/2020   S/P panniculectomy 01/02/2020   Family history of diabetes  mellitus in grandmother 04/20/2019   Obesity (BMI 30.0-34.9) 04/20/2019   Obesity (BMI 30-39.9) 01/05/2019   Hypothyroidism 09/29/2017   Migraine 09/29/2017   Knee pain, bilateral 09/29/2017   Generalized anxiety disorder 09/29/2017   Gastroesophageal reflux disease 09/29/2017   Past Medical History:  Diagnosis Date   Allergy    Anemia    Anxiety    Arthritis    arthritis knees   Blood transfusion without reported diagnosis    GERD (gastroesophageal reflux disease)    Hypothyroidism    Neuromuscular disorder (HCC)    Obesity (BMI 30-39.9) 01/05/2019   stroke    Vitamin D deficiency     Family History  Problem Relation Age of Onset   Hypertension Mother    Crohn's disease Mother    Colon polyps Mother    Hypertension Father        POTS   Stroke Father        TIA   Hyperlipidemia Father    Congestive Heart Failure Father 69   Hypertension Brother    Breast cancer Maternal Grandmother    Diabetes Maternal Grandmother    Heart failure Maternal Grandmother    Thyroid disease Paternal Grandmother    Vitamin D deficiency Paternal Grandmother    Heart failure Paternal Grandfather    Heart attack Paternal Grandfather    Breast cancer Maternal Aunt    Colon cancer Maternal Uncle        dx'd in 70's   Colon cancer Maternal Uncle 58    Past Surgical History:  Procedure Laterality Date   BIOPSY  04/03/2020   Procedure: BIOPSY;  Surgeon: Marguerita Merles, Reuel Boom, MD;  Location: [facility];  Service: Gastroenterology;;  antral, body, distal esophagus, mid esophagus   BRACHIOPLASTY Bilateral 07/02/2020   Procedure: BILATERAL BRACHIOPLASTY WITH LIPOSUCTION;  Surgeon: [name], MD;  Location: [facility];  Service: Plastics;  Laterality: Bilateral;   DILATION AND CURETTAGE OF UTERUS  2022   ESOPHAGOGASTRODUODENOSCOPY (EGD) WITH PROPOFOL N/A 04/03/2020   Procedure: ESOPHAGOGASTRODUODENOSCOPY (EGD) WITH PROPOFOL;  Surgeon: Dolores Frame, MD;   Location: [facility];  Service: Gastroenterology;  Laterality: N/A;  11:15   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   LIPOSUCTION Bilateral 07/02/2020   Procedure: LIPOSUCTION;  Surgeon: [name], MD;  Location: [facility];  Service: Plastics;  Laterality: Bilateral;   PANNICULECTOMY N/A 08/04/2019   Procedure: PANNICULECTOMY;  Surgeon: [name], MD;  Location: [facility];  Service: Plastics;  Laterality: N/A;  2 hours only, please  TEE WITHOUT CARDIOVERSION N/A 10/07/2022   Procedure: TRANSESOPHAGEAL ECHOCARDIOGRAM (TEE);  Surgeon: Meriam Sprague, MD;  Location: [facility];  Service: Cardiovascular;  Laterality: N/A;   Social History   Occupational History   Occupation: cma    Employer: [employer]  Tobacco Use   Smoking status: Never   Smokeless tobacco: Never  Vaping Use   Vaping status: Never Used  Substance and Sexual Activity   Alcohol use: No   Drug use: No   Sexual activity: Not Currently    Birth control/protection: I.U.D.

## 2023-11-09 ENCOUNTER — Telehealth (INDEPENDENT_AMBULATORY_CARE_PROVIDER_SITE_OTHER): Admitting: Psychology

## 2023-11-12 DIAGNOSIS — E039 Hypothyroidism, unspecified: Secondary | ICD-10-CM | POA: Diagnosis not present

## 2023-11-16 ENCOUNTER — Other Ambulatory Visit: Payer: Self-pay

## 2023-11-16 ENCOUNTER — Other Ambulatory Visit (HOSPITAL_BASED_OUTPATIENT_CLINIC_OR_DEPARTMENT_OTHER): Payer: Self-pay

## 2023-11-16 MED ORDER — THYROID 120 MG PO TABS
120.0000 mg | ORAL_TABLET | Freq: Every day | ORAL | 0 refills | Status: DC
  Filled 2023-11-16 – 2023-11-24 (×2): qty 30, 30d supply, fill #0

## 2023-11-16 MED ORDER — THYROID 60 MG PO TABS
60.0000 mg | ORAL_TABLET | Freq: Every day | ORAL | 0 refills | Status: DC
  Filled 2023-11-16 – 2023-11-24 (×2): qty 30, 30d supply, fill #0

## 2023-11-18 ENCOUNTER — Other Ambulatory Visit: Payer: Self-pay

## 2023-11-19 ENCOUNTER — Encounter (INDEPENDENT_AMBULATORY_CARE_PROVIDER_SITE_OTHER): Payer: Self-pay | Admitting: Family Medicine

## 2023-11-19 ENCOUNTER — Encounter: Payer: Self-pay | Admitting: Neurology

## 2023-11-19 ENCOUNTER — Other Ambulatory Visit (HOSPITAL_COMMUNITY): Payer: Self-pay

## 2023-11-19 ENCOUNTER — Ambulatory Visit: Payer: 59 | Admitting: Neurology

## 2023-11-19 ENCOUNTER — Ambulatory Visit (INDEPENDENT_AMBULATORY_CARE_PROVIDER_SITE_OTHER): Admitting: Family Medicine

## 2023-11-19 ENCOUNTER — Other Ambulatory Visit: Payer: Self-pay

## 2023-11-19 VITALS — BP 106/71 | HR 64 | Temp 98.8°F | Ht 65.0 in | Wt 183.0 lb

## 2023-11-19 DIAGNOSIS — E559 Vitamin D deficiency, unspecified: Secondary | ICD-10-CM

## 2023-11-19 DIAGNOSIS — G43909 Migraine, unspecified, not intractable, without status migrainosus: Secondary | ICD-10-CM | POA: Diagnosis not present

## 2023-11-19 DIAGNOSIS — E88819 Insulin resistance, unspecified: Secondary | ICD-10-CM | POA: Diagnosis not present

## 2023-11-19 DIAGNOSIS — F5089 Other specified eating disorder: Secondary | ICD-10-CM | POA: Diagnosis not present

## 2023-11-19 DIAGNOSIS — E66811 Obesity, class 1: Secondary | ICD-10-CM | POA: Diagnosis not present

## 2023-11-19 DIAGNOSIS — Z6830 Body mass index (BMI) 30.0-30.9, adult: Secondary | ICD-10-CM

## 2023-11-19 MED ORDER — VITAMIN D (ERGOCALCIFEROL) 1.25 MG (50000 UNIT) PO CAPS
50000.0000 [IU] | ORAL_CAPSULE | ORAL | 1 refills | Status: DC
  Filled 2023-11-19: qty 4, 28d supply, fill #0

## 2023-11-19 MED ORDER — NURTEC 75 MG PO TBDP
75.0000 mg | ORAL_TABLET | ORAL | 11 refills | Status: DC
Start: 2023-11-19 — End: 2023-12-30
  Filled 2023-11-19: qty 8, 30d supply, fill #0
  Filled 2023-12-30: qty 16, 32d supply, fill #0

## 2023-11-19 MED ORDER — PROPRANOLOL HCL ER 60 MG PO CP24
60.0000 mg | ORAL_CAPSULE | Freq: Every day | ORAL | 3 refills | Status: DC
Start: 2023-11-19 — End: 2024-02-24
  Filled 2023-11-19: qty 90, 90d supply, fill #0
  Filled 2023-12-21: qty 30, 30d supply, fill #0

## 2023-11-19 NOTE — Progress Notes (Signed)
 [facility]  PATIENT: Donna Mccarthy DOB: 05/31/1976  REQUESTING CLINICIAN: [name], MD HISTORY FROM: Patient  REASON FOR VISIT: MRI Negative stroke    HISTORICAL  CHIEF COMPLAINT:  Chief Complaint  Patient presents with   Follow-up    Pt in 12, alone, Pt is following up after CVA: pt stated that she is experiencing fatigue, aphasia, and headaches: 5-30 days. Triggers for ha's are unidentifiable     INTERVAL HISTORY 11/19/2023:  Patient presents today for follow-up, last visit was a year ago since then she has been doing well, denies any stroke or strokelike symptoms.  She tells me that lately she has been experiencing right leg pain, described as a stabbing pain.  Feels like it might be related to her back.   In terms of the headaches, she still having headaches, the frequency of the headaches increased. She is on Topiramate as preventive but does complaints of word finding difficulty.  She tells me that she continue to take Maxalt occasionally.    HISTORY OF PRESENT ILLNESS:  This is a 48 year old woman with multiple medical conditions including migraine headaches, anxiety, vitamin D deficiency, who was admitted on 216 for left facial droop, blurry vision and slurred speech.  She was also found to have dysarthria.  Due to ongoing symptoms she was given TKN.  Her workup has been negative for any LVO or any stroke on MRI brain due to persistent of symptoms, she was deemed to have MRI negative stroke. She was admitted to acute rehab.  She completed rehab with resolution of her symptoms. She completed DAPT and now she is on Aspirin alone, denies side effects. Since discharge from the hospital she has been doing well.  She reports occasional headache for which she takes Fioricet with some improvement.  She was told to stop the Imitrex.  She is wondering if she can go back to driving.   Hospital Course and Summary  Donna Mccarthy is a 48 y.o. female with  history of migraines, anxiety, vitamin D deficiency, COVID 3 weeks prior to admission on 10/03/2022 with left facial droop, blurred vision and slurred speech.  She was found to have severe dysarthria with left facial droop that was variable and bilateral CN VII incomplete palsy.  CTA without LVO and she received TNK.  MRI/MRA brain done and was negative for stroke, stenosis or acute process.  She did have disconjugate gaze with diplopia that Dr. Roda Shutters felt was more consistent with possible brainstem infarct secondary to small vessel disease and not seen on MRI.  Neurology recommended DAPT x 3 weeks followed by aspirin alone.  Therapy was working with patient who continued to be limited by double vision, right-sided weakness with decrease in motor control as well as sensory deficits.  CIR was recommended due to functional decline.   She was admitted to rehab 10/08/2022 for inpatient therapies to consist of PT, ST and OT at least three hours five days a week. Past admission physiatrist, therapy team and rehab RN have worked together to provide customized collaborative inpatient rehab.  She was maintained on DAPT during her stay and is tolerating this without any side effects.  Follow-up CBC did show slight drop in H&H without any signs of bleeding.  She completes her DAPT close in 48 hours and needs to stop Plavix and will continue on aspirin only.  Recommend repeat check CBC in 1 to 2 weeks to monitor for H&H stability.   Her mood has been  stable on home dose Wellbutrin.  She she reported pain right calf therefore BLE Dopplers done and were negative for DVT.  PT has worked with patient on stretches and Voltaren gel and K-pad will use with resolution of symptoms. Her blood pressures were monitored on TID basis and have been stable.  Transient hypokalemia has resolved with brief supplementation.  Headaches are currently managed with use of Topamax alone.  Ergocalciferol was resumed due to history of low vitamin D  levels.  Made good gains during his stay and is modified independent at discharge.  She will continue to receive outpatient PT, OT and ST at [facility] rehab at [facility] after discharge.  OTHER MEDICAL CONDITIONS: Migraines, anxiety, vitamin D deficiency   REVIEW OF SYSTEMS: Full 14 system review of systems performed and negative with exception of: As noted in the HPI   ALLERGIES: Allergies  Allergen Reactions   Ketorolac Tromethamine Other (See Comments)    "felt like whole body was on fire"   Penicillins Other (See Comments)    Mother said she had a rash   Celebrex [Celecoxib] Itching    itching   Compazine [Prochlorperazine] Other (See Comments)    Out of body experience   Egg-Derived Products Other (See Comments)    HOME MEDICATIONS: Outpatient Medications Prior to Visit  Medication Sig Dispense Refill   aspirin 81 MG chewable tablet Chew 1 tablet (81 mg total) by mouth daily. 108 tablet 0   atorvastatin (LIPITOR) 20 MG tablet Take 1 tablet (20 mg total) by mouth daily. 90 tablet 1   buPROPion (WELLBUTRIN XL) 300 MG 24 hr tablet Take 1 tablet (300 mg total) by mouth daily. 90 tablet 2   butalbital-acetaminophen-caffeine (FIORICET) 50-325-40 MG tablet Take 1 tablet by mouth daily as needed for headache. 10 tablet 1   diclofenac Sodium (VOLTAREN) 1 % GEL Apply 2 g topically 4 (four) times daily. 350 g 0   Docusate Sodium (DSS) 100 MG CAPS 1 capsule as needed Orally Once a day     Ferric Maltol (ACCRUFER) 30 MG CAPS Take 1 capsule (30 mg total) by mouth daily. 30 capsule 1   fluticasone (FLONASE) 50 MCG/ACT nasal spray Place 2 sprays into both nostrils as needed for allergies.     levonorgestrel (MIRENA, 52 MG,) 20 MCG/DAY IUD Take by intrauterine route.     loratadine (CLARITIN) 10 MG tablet Take 10 mg by mouth daily as needed for allergies.     Magnesium 250 MG TABS Take 250 mg by mouth every other day.     SUMAtriptan (IMITREX) 50 MG tablet Take 1 tablet (50 mg total) by  mouth every 2 (two) hours as needed for migraine. May repeat in 2 hours if headache persists or recurs. 10 tablet 6   thyroid (NP THYROID) 120 MG tablet Take 1 tablet (120 mg total) by mouth daily. 30 tablet 0   thyroid (NP THYROID) 60 MG tablet Take 1 tablet (60 mg total) by mouth daily on an empty stomach. 30 tablet 0   Rimegepant Sulfate (NURTEC) 75 MG TBDP Take 1 tab at the first sign of a migraine. 4 tablet 0   topiramate (TOPAMAX) 25 MG tablet Take 3 tablets (75 mg total) by mouth at bedtime. 270 tablet 2   Vitamin D, Ergocalciferol, (DRISDOL) 1.25 MG (50000 UNIT) CAPS capsule Take 1 capsule (50,000 Units total) by mouth every 7 (seven) days. 4 capsule 0   pantoprazole (PROTONIX) 40 MG tablet Take 1 tablet (40 mg total) by  mouth at bedtime. 30 tablet 0   predniSONE (DELTASONE) 10 MG tablet Take 1 tablet (10 mg total) by mouth 2 (two) times daily with a meal. 10 tablet 0   thyroid (NP THYROID) 120 MG tablet Take 1 tablet (120 mg total) by mouth daily on an empty stomach 30 tablet 0   thyroid (NP THYROID) 120 MG tablet Take 1 tablet (120 mg total) by mouth daily on an empty stomach. 30 tablet 5   thyroid (NP THYROID) 15 MG tablet Take 1 tablet (15 mg total) by mouth daily on an empty stomach 30 tablet 1   thyroid (NP THYROID) 30 MG tablet Take 1 tablet (30 mg total) by mouth daily on an empty stomach 30 tablet 5   thyroid (NP THYROID) 30 MG tablet Take 1 tablet (30 mg total) by mouth daily on an empty stomach. 30 tablet 5   No facility-administered medications prior to visit.    PAST MEDICAL HISTORY: Past Medical History:  Diagnosis Date   Allergy    Anemia    Anxiety    Arthritis    arthritis knees   Blood transfusion without reported diagnosis    GERD (gastroesophageal reflux disease)    Hypothyroidism    Neuromuscular disorder (HCC)    Obesity (BMI 30-39.9) 01/05/2019   stroke    Vitamin D deficiency     PAST SURGICAL HISTORY: Past Surgical History:  Procedure Laterality  Date   BIOPSY  04/03/2020   Procedure: BIOPSY;  Surgeon: Dolores Frame, MD;  Location: [facility];  Service: Gastroenterology;;  antral, body, distal esophagus, mid esophagus   BRACHIOPLASTY Bilateral 07/02/2020   Procedure: BILATERAL BRACHIOPLASTY WITH LIPOSUCTION;  Surgeon: [name], MD;  Location: [facility];  Service: Plastics;  Laterality: Bilateral;   DILATION AND CURETTAGE OF UTERUS  2022   ESOPHAGOGASTRODUODENOSCOPY (EGD) WITH PROPOFOL N/A 04/03/2020   Procedure: ESOPHAGOGASTRODUODENOSCOPY (EGD) WITH PROPOFOL;  Surgeon: Dolores Frame, MD;  Location: [facility];  Service: Gastroenterology;  Laterality: N/A;  11:15   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   LIPOSUCTION Bilateral 07/02/2020   Procedure: LIPOSUCTION;  Surgeon: [name], MD;  Location: [facility];  Service: Plastics;  Laterality: Bilateral;   PANNICULECTOMY N/A 08/04/2019   Procedure: PANNICULECTOMY;  Surgeon: [name], MD;  Location: [facility];  Service: Plastics;  Laterality: N/A;  2 hours only, please   TEE WITHOUT CARDIOVERSION N/A 10/07/2022   Procedure: TRANSESOPHAGEAL ECHOCARDIOGRAM (TEE);  Surgeon: Meriam Sprague, MD;  Location[facility];  Service: Cardiovascular;  Laterality: N/A;    FAMILY HISTORY: Family History  Problem Relation Age of Onset   Hypertension Mother    Crohn's disease Mother    Colon polyps Mother    Hypertension Father        POTS   Stroke Father        TIA   Hyperlipidemia Father    Congestive Heart Failure Father 67   Hypertension Brother    Breast cancer Maternal Grandmother    Diabetes Maternal Grandmother    Heart failure Maternal Grandmother    Thyroid disease Paternal Grandmother    Vitamin D deficiency Paternal Grandmother    Heart failure Paternal Grandfather    Heart attack Paternal Grandfather    Breast cancer Maternal Aunt    Colon cancer Maternal Uncle         dx'd in 70's   Colon cancer Maternal Uncle 14  SOCIAL HISTORY: Social History   Socioeconomic History   Marital status: Single    Spouse name: Not on file   Number of children: 1   Years of education: 14   Highest education level: Associate degree: occupational, Scientist, product/process development, or vocational program  Occupational History   Occupation: cma    Employer:   Tobacco Use   Smoking status: Never   Smokeless tobacco: Never  Vaping Use   Vaping status: Never Used  Substance and Sexual Activity   Alcohol use: No   Drug use: No   Sexual activity: Not Currently    Birth control/protection: I.U.D.  Other Topics Concern   Not on file  Social History Narrative   Lives alone.   Son [name] lives in [place], [place], has a son Penni Bombard.   Works in [employer] pool for [employer] 04/2023   Social Drivers of Health   Financial Resource Strain: Medium Risk (05/20/2023)   Overall Financial Resource Strain (CARDIA)    Difficulty of Paying Living Expenses: Somewhat hard  Food Insecurity: No Food Insecurity (05/20/2023)   Hunger Vital Sign    Worried About Running Out of Food in the Last Year: Never true    Ran Out of Food in the Last Year: Never true  Transportation Needs: No Transportation Needs (05/20/2023)   PRAPARE - Administrator, Civil Service (Medical): No    Lack of Transportation (Non-Medical): No  Physical Activity: Sufficiently Active (05/20/2023)   Exercise Vital Sign    Days of Exercise per Week: 5 days    Minutes of Exercise per Session: 40 min  Stress: Not on file  Social Connections: Unknown (05/20/2023)   Social Connection and Isolation Panel [NHANES]    Frequency of Communication with Friends and Family: Three times a week    Frequency of Social Gatherings with Friends and Family: Twice a week    Attends Religious Services: More than 4 times per year    Active Member of [employer] or Organizations: No    Attends Banker Meetings: 1 to 4 times  per year    Marital Status: Not on file  Intimate Partner Violence: Not At Risk (05/20/2023)   Humiliation, Afraid, Rape, and Kick questionnaire    Fear of Current or Ex-Partner: No    Emotionally Abused: No    Physically Abused: No    Sexually Abused: No    PHYSICAL EXAM  GENERAL EXAM/CONSTITUTIONAL: Vitals:  Vitals:   11/19/23 0902  BP: 112/80  Resp: 15  Weight: 186 lb 1.1 oz (84.4 kg)  Height: 5\' 5"  (1.651 m)   Body mass index is 30.96 kg/m. Wt Readings from Last 3 Encounters:  11/19/23 183 lb (83 kg)  11/19/23 186 lb 1.1 oz (84.4 kg)  11/03/23 186 lb 1.9 oz (84.4 kg)   Patient is in no distress; well developed, nourished and groomed; neck is supple  MUSCULOSKELETAL: Gait, strength, tone, movements noted in Neurologic exam below  NEUROLOGIC: MENTAL STATUS:      No data to display         awake, alert, oriented to person, place and time recent and remote memory intact normal attention and concentration language fluent, comprehension intact, naming intact fund of knowledge appropriate  CRANIAL NERVE:  2nd, 3rd, 4th, 6th - Visual fields full to confrontation, extraocular muscles intact, no nystagmus 5th - facial sensation symmetric 7th - facial strength symmetric 8th - hearing intact 9th - palate elevates symmetrically, uvula midline  11th - shoulder shrug symmetric 12th - tongue protrusion midline  MOTOR:  normal bulk and tone, full strength in the BUE, BLE  SENSORY:  normal and symmetric to light touch  COORDINATION:  finger-nose-finger, fine finger movements normal  GAIT/STATION:  normal   DIAGNOSTIC DATA (LABS, IMAGING, TESTING) - I reviewed patient records, labs, notes, testing and imaging myself where available.  Lab Results  Component Value Date   WBC 4.0 10/01/2023   HGB 11.5 10/01/2023   HCT 37.4 10/01/2023   MCV 86 10/01/2023   PLT 208 10/01/2023      Component Value Date/Time   NA 141 10/01/2023 0916   K 4.5 10/01/2023 0916    CL 105 10/01/2023 0916   CO2 20 10/01/2023 0916   GLUCOSE 83 10/01/2023 0916   GLUCOSE 88 01/14/2023 1119   BUN 7 10/01/2023 0916   CREATININE 0.83 10/01/2023 0916   CALCIUM 9.4 10/01/2023 0916   PROT 7.1 10/01/2023 0916   ALBUMIN 4.3 10/01/2023 0916   AST 19 10/01/2023 0916   ALT 16 10/01/2023 0916   ALKPHOS 76 10/01/2023 0916   BILITOT 0.8 10/01/2023 0916   GFRNONAA >60 01/14/2023 1110   GFRAA 129 04/27/2020 1503   Lab Results  Component Value Date   CHOL 175 10/01/2023   HDL 76 10/01/2023   LDLCALC 92 10/01/2023   TRIG 31 10/01/2023   CHOLHDL 2.3 10/01/2023   Lab Results  Component Value Date   HGBA1C 5.3 10/01/2023   Lab Results  Component Value Date   VITAMINB12 814 10/01/2023   Lab Results  Component Value Date   TSH 28.100 (H) 10/01/2023   MRI/MRA  1. No acute intracranial process. No evidence of acute or subacute infarct. 2. No intracranial large vessel occlusion or significant stenosis. Previously seen narrowing of the MCAs and PCAs on the CTA is not apparent on this exam and may have been secondary to poor vascular opacification  Echo 10/07/22 1. Left ventricular ejection fraction, by estimation, is 60 to 65%. The left ventricle has normal function.  2. Right ventricular systolic function is normal. The right ventricular size is normal.  3. No left atrial/left atrial appendage thrombus was detected.  4. The mitral valve is normal in structure. Trivial mitral valve regurgitation.  5. The aortic valve is tricuspid. Aortic valve regurgitation is not visualized. No aortic stenosis is present.  6. The atrial septum is redundant. No interatrial shunting detected by color doppler. Multiple agitated saline contrast bubble studies performed. The majority of bubbles were seen late suggestive of intrapulmonary  shunting. There were a small number of bubbles seen early during the study suggestive of a small PFO. Recommend transcranial doppler for further evaluation.     ASSESSMENT AND PLAN  48 y.o. year old female with medical conditions including high anxiety, migraine headaches, vitamin D deficiency, thyroid disease, who is presenting for follow-up for her MRI negative stroke and headaches.  Interim of the stroke, she has not had any new strokelike symptoms however she did complain of right leg pain, most likely related to her back. In terms of the headaches, she reported increased frequency of her migraine, she is on topiramate 75 mg but that report word finding difficulty.  Instead of increasing topiramate, we will switch patient to propranolol.  I will also give her Nurtec, due to her history of previous history of stroke, triptans contraindicated.  Advised patient to contact me if the frequency of the headaches do not improve after starting this medication.  I will see her in 6 months for follow-up or sooner if worse.   1. Episodic migraine      Patient Instructions  Discontinue topiramate Start propranolol XR 60 mg nightly Start Nurtec 75 mg every other day Discontinue Maxalt Consider Excedrin or Aleve as needed for headaches Follow-up in 6 months or sooner if worse.   No orders of the defined types were placed in this encounter.   Meds ordered this encounter  Medications   Rimegepant Sulfate (NURTEC) 75 MG TBDP    Sig: Take 1 tablet (75 mg total) by mouth every other day. Take 1 tab at the first sign of a migraine.    Dispense:  16 tablet    Refill:  11   propranolol ER (INDERAL LA) 60 MG 24 hr capsule    Sig: Take 1 capsule (60 mg total) by mouth at bedtime.    Dispense:  90 capsule    Refill:  3    Return in about 6 months (around 05/20/2024).   Windell Norfolk, MD 11/19/2023, 1:25 PM  [facility] [address] [place], [place] [address] [phone]

## 2023-11-19 NOTE — Progress Notes (Signed)
 Donna Mccarthy, D.O.  ABFM, ABOM Specializing in Clinical Bariatric Medicine  Office located at: [address] [place], [place]  [address]   Assessment and Plan:   Medications Discontinued During This Encounter  Medication Reason   Vitamin D, Ergocalciferol, (DRISDOL) 1.25 MG (50000 UNIT) CAPS capsule Reorder    Meds ordered this encounter  Medications   Vitamin D, Ergocalciferol, (DRISDOL) 1.25 MG (50000 UNIT) CAPS capsule    Sig: Take 1 capsule (50,000 Units total) by mouth every 7 (seven) days.    Dispense:  4 capsule    Refill:  1     FOR THE DISEASE OF OBESITY: BMI 30.0-30.9,adult -- Current BMI 30.45 Class 1 obesity starting BMI 31.78 date/10/01/23 Assessment & Plan: Since last office visit on 11/02/23 patient's muscle mass has decreased by 2.2 lb. Fat mass has increased by 3.4 lb. Total body water has decreased by 0.2 lb.  Counseling done on how various foods will affect these numbers and how to maximize success  Total lbs lost to date: 8 lbs  Total weight loss percentage to date: -4.19%   Recommended Dietary Goals Donna Mccarthy is currently in the action stage of change. As such, her goal is to continue weight management plan.  She has agreed to: Continue with Pescetarian plan and add journaling without parameters.  If needed, she will be given goal calories/protein per day at her next office visit.   Behavioral Intervention We discussed the following today: increasing lean protein intake to established goals, decreasing simple carbohydrates , increasing vegetables, increasing lower glycemic fruits, increasing fiber rich foods, and work on tracking and journaling calories using tracking application,  Additional resources provided today: Handout on Examples of Low Glycemic Index and Low Calorie Fruits & Vegetables  Evidence-based interventions for health behavior change were utilized today including the discussion of self monitoring techniques, problem-solving  barriers and SMART goal setting techniques.   Regarding patient's less desirable eating habits and patterns, we employed the technique of small changes.   Pt will specifically work on: [employer] daily caloric and protein intake for next visit.    Recommended Physical Activity Goals Donna Mccarthy has been advised to work up to 150 minutes of moderate intensity aerobic activity a week and strengthening exercises 2-3 times per week for cardiovascular health, weight loss maintenance and preservation of muscle mass.   She is Unable to participate in physical activity at present due to medical conditions    Pharmacotherapy We both agreed to: continue with nutritional and behavioral strategies, adequate clinical response to current dose, continue current regimen, and stop Topamax at the recommendation of neurology.    FOR ASSOCIATED CONDITIONS ADDRESSED TODAY: Insulin resistance Assessment & Plan: Lab Results  Component Value Date   HGBA1C 5.3 10/01/2023   HGBA1C 5.0 10/04/2022   HGBA1C 5.4 04/20/2019   INSULIN 6.3 10/01/2023   No meds currently. Diet/exercise approach. She reports uncontrolled hunger and carb cravings. She notes she has not been eating enough protein lately.   Reviewed the importance of protein to help improve hunger and cravings. Increase protein intake and decrease simple carbs/sugars per her prudent nutritional meal plan. Encouraged pt to journal her intake to increase mindfulness or her daily calorie and protein intake. Will continue to monitor condition.    Other Specified Feeding or Eating Disorder, Emotional Eating Behaviors Assessment & Plan: Moods stable currently. Denies any SI/HI. Hunger and cravings are uncontrolled. Pt is on Wellbutrin XL 300 mg once daily. Good compliance and tolerating well  with no adverse side effects. She was seen by Dr. Teresa Coombs of neurology earlier today. Pt reports she was switched from Topamax to Propranolol for episodic migraines.    Continue with Wellbutrin daily as prescribed. Patient advised to follow neurology's recommendations and attend follow ups as instructed. Stressed the importance of increasing protein intake for better control of hunger and cravings. Will continue to monitor condition as it relates to her weight loss journey.    Vitamin D deficiency Assessment & Plan: Lab Results  Component Value Date   VD25OH 28.8 (L) 10/01/2023   VD25OH 32.6 05/20/2023   VD25OH 21.7 (L) 08/25/2022   Last vitamin D levels obtained were below goal at 28.8. Pt is compliant with ERGO 50K units once weekly. Tolerating well with no adverse SE. No acute concerns in this regard.   Continue with current supplementation. Will refill ERGO today with no changes. Will continue to monitor condition and plan to recheck vitamin D levels in about 2 months, in 01/2024.   Orders: - Refill ERGO, no dose changes   Follow up:   Return in about 3 weeks (around 12/10/2023) for 3-4 week follow up x2 - with either Dr. Marshell Garfinkel, or next availalble provider. . She was informed of the importance of frequent follow up visits to maximize her success with intensive lifestyle modifications for her multiple health conditions.  Subjective:   Chief complaint: Obesity Donna Mccarthy is here to discuss her progress with her obesity treatment plan. She is on the [employer] and states she is following her eating plan approximately 80-90% of the time. She states she is not exercising due to back injury.   Interval History:  Donna Mccarthy is here for a follow up office visit. Since last OV with Shawn Rayburn PA on 11/02/23, she has gained 1 lb. She has been eating healthy snacks between meals but still having occasional hunger and cravings. For breakfast she has black coffee, lemon water, yogurt, an apple, and cheese. Has a salad with tuna for lunch. Has not been eating enough protein on a consistent basis. Pt was recently put on Prednisone following a back  injury.   Pharmacotherapy for weight loss: She is currently taking Bupropion (single agent, off label use) with adequate clinical response  and without side effects..   Review of Systems:  Pertinent positives were addressed with patient today.  Reviewed by clinician on day of visit: allergies, medications, problem list, medical history, surgical history, family history, social history, and previous encounter notes.  Weight Summary and Biometrics   Weight Lost Since Last Visit: 0  Weight Gained Since Last Visit: 1 lb    Vitals Temp: 98.8 F (37.1 C) BP: 106/71 Pulse Rate: 64 SpO2: 100 %   Anthropometric Measurements Height: 5\' 5"  (1.651 m) Weight: 183 lb (83 kg) BMI (Calculated): 30.45 Weight at Last Visit: 182 lb Weight Lost Since Last Visit: 0 Weight Gained Since Last Visit: 1 lb Starting Weight: 191 lb Total Weight Loss (lbs): 8 lb (3.629 kg) Peak Weight: 306 lb   Body Composition  Body Fat %: 41.7 % Fat Mass (lbs): 76.4 lbs Muscle Mass (lbs): 101.2 lbs Total Body Water (lbs): 77.8 lbs Visceral Fat Rating : 9    Objective:   PHYSICAL EXAM: Blood pressure 106/71, pulse 64, temperature 98.8 F (37.1 C), height 5\' 5"  (1.651 m), weight 183 lb (83 kg), SpO2 100%. Body mass index is 30.45 kg/m.  General: she is overweight, cooperative and in no acute distress. PSYCH: Has  normal mood, affect and thought process.   HEENT: EOMI, sclerae are anicteric. Lungs: Normal breathing effort, no conversational dyspnea. Extremities: Moves * 4 Neurologic: A and O * 3, good insight  DIAGNOSTIC DATA REVIEWED: BMET    Component Value Date/Time   NA 141 10/01/2023 0916   K 4.5 10/01/2023 0916   CL 105 10/01/2023 0916   CO2 20 10/01/2023 0916   GLUCOSE 83 10/01/2023 0916   GLUCOSE 88 01/14/2023 1119   BUN 7 10/01/2023 0916   CREATININE 0.83 10/01/2023 0916   CALCIUM 9.4 10/01/2023 0916   GFRNONAA >60 01/14/2023 1110   GFRAA 129 04/27/2020 1503   Lab Results   Component Value Date   HGBA1C 5.3 10/01/2023   HGBA1C 5.4 04/20/2019   Lab Results  Component Value Date   INSULIN 6.3 10/01/2023   Lab Results  Component Value Date   TSH 28.100 (H) 10/01/2023   CBC    Component Value Date/Time   WBC 4.0 10/01/2023 0916   WBC 3.5 (L) 04/21/2023 1543   RBC 4.34 10/01/2023 0916   RBC 3.90 04/21/2023 1543   HGB 11.5 10/01/2023 0916   HCT 37.4 10/01/2023 0916   PLT 208 10/01/2023 0916   MCV 86 10/01/2023 0916   MCH 26.5 (L) 10/01/2023 0916   MCH 26.7 04/21/2023 1543   MCHC 30.7 (L) 10/01/2023 0916   MCHC 31.3 04/21/2023 1543   RDW 13.9 10/01/2023 0916   Iron Studies    Component Value Date/Time   IRON 93 04/27/2020 1503   TIBC 297 04/27/2020 1503   FERRITIN 47 04/27/2020 1503   IRONPCTSAT 31 04/27/2020 1503   Lipid Panel     Component Value Date/Time   CHOL 175 10/01/2023 0916   TRIG 31 10/01/2023 0916   HDL 76 10/01/2023 0916   CHOLHDL 2.3 10/01/2023 0916   CHOLHDL 3.2 10/04/2022 0244   VLDL 6 10/04/2022 0244   LDLCALC 92 10/01/2023 0916   Hepatic Function Panel     Component Value Date/Time   PROT 7.1 10/01/2023 0916   ALBUMIN 4.3 10/01/2023 0916   AST 19 10/01/2023 0916   ALT 16 10/01/2023 0916   ALKPHOS 76 10/01/2023 0916   BILITOT 0.8 10/01/2023 0916   BILIDIR 0.13 01/26/2023 0836   IBILI NOT CALCULATED 10/24/2019 1604      Component Value Date/Time   TSH 28.100 (H) 10/01/2023 0916   Nutritional Lab Results  Component Value Date   VD25OH 28.8 (L) 10/01/2023   VD25OH 32.6 05/20/2023   VD25OH 21.7 (L) 08/25/2022    Attestations:   Burnett Sheng, acting as a medical scribe for Thomasene Lot, DO., have compiled all relevant documentation for today's office visit on behalf of Thomasene Lot, DO, while in the presence of [employer], DO.  Reviewed by clinician on day of visit: allergies, medications, problem list, medical history, surgical history, family history, social history, and previous  encounter notes pertinent to patient's obesity diagnosis.  I have reviewed the above documentation for accuracy and completeness, and I agree with the above. Donna Mccarthy, D.O.  The 21st Century Cures Act was signed into law in 2016 which includes the topic of electronic health records.  This provides immediate access to information in MyChart.  This includes consultation notes, operative notes, office notes, lab results and pathology reports.  If you have any questions about what you read please let us know at your next visit so we can discuss your concerns and take corrective action if need be.  We are right here with you.

## 2023-11-19 NOTE — Patient Instructions (Signed)
 Discontinue topiramate Start propranolol XR 60 mg nightly Start Nurtec 75 mg every other day Discontinue Maxalt Consider Excedrin or Aleve as needed for headaches Follow-up in 6 months or sooner if worse.

## 2023-11-20 ENCOUNTER — Telehealth: Payer: Self-pay | Admitting: Pharmacist

## 2023-11-20 NOTE — Telephone Encounter (Signed)
 Pharmacy Patient Advocate Encounter   Received notification from Patient Pharmacy that prior authorization for Nurtec 75MG  dispersible tablets is required/requested.   Insurance verification completed.   The patient is insured through [facility] .   Per test claim: PA required; PA submitted to above mentioned insurance via CoverMyMeds Key/confirmation #/EOC [id number] Status is pending

## 2023-11-23 NOTE — Progress Notes (Incomplete)
 Donna Mccarthy, D.O.  ABFM, ABOM Specializing in Clinical Bariatric Medicine  Office located at: [address] [place], [place]  [address]   Assessment and Plan:   Medications Discontinued During This Encounter  Medication Reason  . Vitamin D, Ergocalciferol, (DRISDOL) 1.25 MG (50000 UNIT) CAPS capsule Reorder    Meds ordered this encounter  Medications  . Vitamin D, Ergocalciferol, (DRISDOL) 1.25 MG (50000 UNIT) CAPS capsule    Sig: Take 1 capsule (50,000 Units total) by mouth every 7 (seven) days.    Dispense:  4 capsule    Refill:  1     FOR THE DISEASE OF OBESITY: BMI 30.0-30.9,adult -- Current BMI 30.45 Class 1 obesity starting BMI 31.78 date/10/01/23 Assessment & Plan: Since last office visit on 11/02/23 patient's muscle mass has decreased by 2.2 lb. Fat mass has increased by 3.4 lb. Total body water has decreased by 0.2 lb.  Counseling done on how various foods will affect these numbers and how to maximize success  Total lbs lost to date: 8 lbs  Total weight loss percentage to date: -4.19%   Recommended Dietary Goals [name] is currently in the action stage of change. As such, her goal is to continue weight management plan.  She has agreed to: Continue with Pescetarian plan and add journaling without     Behavioral Intervention We discussed the following today: {dowtlossstrategies:31654}  Additional resources provided today: {DOhandouts:31655::"None"}  Evidence-based interventions for health behavior change were utilized today including the discussion of self monitoring techniques, problem-solving barriers and SMART goal setting techniques.   Regarding patient's less desirable eating habits and patterns, we employed the technique of small changes.   Pt will specifically work on: *** for next visit.    Recommended Physical Activity Goals [name] has been advised to work up to 150 minutes of moderate intensity aerobic activity a week and strengthening  exercises 2-3 times per week for cardiovascular health, weight loss maintenance and preservation of muscle mass.   She has agreed to :  {EMEXERCISE:28847::"Think about enjoyable ways to increase daily physical activity and overcoming barriers to exercise","Increase physical activity in their day and reduce sedentary time (increase NEAT)."}   Pharmacotherapy We both agreed to : {EMagreedrx:29170}   FOR ASSOCIATED CONDITIONS ADDRESSED TODAY: Insulin resistance Assessment & Plan: Lab Results  Component Value Date   HGBA1C 5.3 10/01/2023   HGBA1C 5.0 10/04/2022   HGBA1C 5.4 04/20/2019   INSULIN 6.3 10/01/2023         ***  Other Specified Feeding or Eating Disorder, Emotional Eating Behaviors Assessment & Plan: Moods stable currently. Denies any SI/HI.   Pt is on Wellbutrin XL 300 mg once daily.      ***  Vitamin D deficiency Assessment & Plan: Lab Results  Component Value Date   VD25OH 28.8 (L) 10/01/2023   VD25OH 32.6 05/20/2023   VD25OH 21.7 (L) 08/25/2022   Pt is compliant with ERGO 50K units once weekly. Tolerating well with no adverse SE.   Continue with current supplementation. Will recheck vitamin D levels in about 2 months, in 01/2024.     Orders: - Refill ERGO, no dose changes     ***   Follow up:   Return in about 3 weeks (around 12/10/2023) for 3-4 week follow up x2 - with either Dr. Marshell Garfinkel, or next availalble provider. . She was informed of the importance of frequent follow up visits to maximize her success with intensive lifestyle modifications for her multiple health conditions.  Subjective:   Chief complaint: Obesity Donna Mccarthy is here to discuss her progress with her obesity treatment plan. She is on the [employer] and states she is following her eating plan approximately 80-90% of the time. She states she is not exercising due to back injury.   Interval History:  Donna Mccarthy is here for a follow up office visit. Since last OV  with Shawn Rayburn PA on 11/02/23, she has gained 1 lb.       Prior to starting   Pt was recently put on Prednisone following a back injury.   Intermittent cravings -- has been eating healthy snacks between meals but still having hunger/cravings.   Black coffee and lemon water and yogurt, apple, cheese,  Lunch salad without tuna    Has not been eating enough protein       Pharmacotherapy for weight loss: She is currently taking {EMPharmaco:28845}.   Review of Systems:  Pertinent positives were addressed with patient today.  Reviewed by clinician on day of visit: allergies, medications, problem list, medical history, surgical history, family history, social history, and previous encounter notes.  Weight Summary and Biometrics   Weight Lost Since Last Visit: 0  Weight Gained Since Last Visit: 1 lb  ***  Vitals Temp: 98.8 F (37.1 C) BP: 106/71 Pulse Rate: 64 SpO2: 100 %   Anthropometric Measurements Height: 5\' 5"  (1.651 m) Weight: 183 lb (83 kg) BMI (Calculated): 30.45 Weight at Last Visit: 182 lb Weight Lost Since Last Visit: 0 Weight Gained Since Last Visit: 1 lb Starting Weight: 191 lb Total Weight Loss (lbs): 8 lb (3.629 kg) Peak Weight: 306 lb   Body Composition  Body Fat %: 41.7 % Fat Mass (lbs): 76.4 lbs Muscle Mass (lbs): 101.2 lbs Total Body Water (lbs): 77.8 lbs Visceral Fat Rating : 9   No data recorded  Objective:   PHYSICAL EXAM: Blood pressure 106/71, pulse 64, temperature 98.8 F (37.1 C), height 5\' 5"  (1.651 m), weight 183 lb (83 kg), SpO2 100%. Body mass index is 30.45 kg/m.  General: she is overweight, cooperative and in no acute distress. PSYCH: Has normal mood, affect and thought process.   HEENT: EOMI, sclerae are anicteric. Lungs: Normal breathing effort, no conversational dyspnea. Extremities: Moves * 4 Neurologic: A and O * 3, good insight  DIAGNOSTIC DATA REVIEWED: BMET    Component Value Date/Time   NA 141  10/01/2023 0916   K 4.5 10/01/2023 0916   CL 105 10/01/2023 0916   CO2 20 10/01/2023 0916   GLUCOSE 83 10/01/2023 0916   GLUCOSE 88 01/14/2023 1119   BUN 7 10/01/2023 0916   CREATININE 0.83 10/01/2023 0916   CALCIUM 9.4 10/01/2023 0916   GFRNONAA >60 01/14/2023 1110   GFRAA 129 04/27/2020 1503   Lab Results  Component Value Date   HGBA1C 5.3 10/01/2023   HGBA1C 5.4 04/20/2019   Lab Results  Component Value Date   INSULIN 6.3 10/01/2023   Lab Results  Component Value Date   TSH 28.100 (H) 10/01/2023   CBC    Component Value Date/Time   WBC 4.0 10/01/2023 0916   WBC 3.5 (L) 04/21/2023 1543   RBC 4.34 10/01/2023 0916   RBC 3.90 04/21/2023 1543   HGB 11.5 10/01/2023 0916   HCT 37.4 10/01/2023 0916   PLT 208 10/01/2023 0916   MCV 86 10/01/2023 0916   MCH 26.5 (L) 10/01/2023 0916   MCH 26.7 04/21/2023 1543   MCHC 30.7 (L) 10/01/2023 1610  MCHC 31.3 04/21/2023 1543   RDW 13.9 10/01/2023 0916   Iron Studies    Component Value Date/Time   IRON 93 04/27/2020 1503   TIBC 297 04/27/2020 1503   FERRITIN 47 04/27/2020 1503   IRONPCTSAT 31 04/27/2020 1503   Lipid Panel     Component Value Date/Time   CHOL 175 10/01/2023 0916   TRIG 31 10/01/2023 0916   HDL 76 10/01/2023 0916   CHOLHDL 2.3 10/01/2023 0916   CHOLHDL 3.2 10/04/2022 0244   VLDL 6 10/04/2022 0244   LDLCALC 92 10/01/2023 0916   Hepatic Function Panel     Component Value Date/Time   PROT 7.1 10/01/2023 0916   ALBUMIN 4.3 10/01/2023 0916   AST 19 10/01/2023 0916   ALT 16 10/01/2023 0916   ALKPHOS 76 10/01/2023 0916   BILITOT 0.8 10/01/2023 0916   BILIDIR 0.13 01/26/2023 0836   IBILI NOT CALCULATED 10/24/2019 1604      Component Value Date/Time   TSH 28.100 (H) 10/01/2023 0916   Nutritional Lab Results  Component Value Date   VD25OH 28.8 (L) 10/01/2023   VD25OH 32.6 05/20/2023   VD25OH 21.7 (L) 08/25/2022    Attestations:   Burnett Sheng, acting as a medical scribe for Thomasene Lot, DO., have compiled all relevant documentation for today's office visit on behalf of Thomasene Lot, DO, while in the presence of [employer], DO.  Reviewed by clinician on day of visit: allergies, medications, problem list, medical history, surgical history, family history, social history, and previous encounter notes pertinent to patient's obesity diagnosis.  I have reviewed the above documentation for accuracy and completeness, and I agree with the above. Donna Mccarthy, D.O.  The 21st Century Cures Act was signed into law in 2016 which includes the topic of electronic health records.  This provides immediate access to information in MyChart.  This includes consultation notes, operative notes, office notes, lab results and pathology reports.  If you have any questions about what you read please let us know at your next visit so we can discuss your concerns and take corrective action if need be.  We are right here with you.

## 2023-11-24 ENCOUNTER — Other Ambulatory Visit (HOSPITAL_COMMUNITY): Payer: Self-pay

## 2023-11-24 ENCOUNTER — Other Ambulatory Visit: Payer: Self-pay

## 2023-11-24 NOTE — Telephone Encounter (Signed)
 Pharmacy Patient Advocate Encounter  Received notification from [facility] that Prior Authorization for Nurtec 75MG  dispersible tablets has been APPROVED from 11/23/2023 to 05/21/2024   PA #/Case ID/Reference #: [id number]

## 2023-11-25 ENCOUNTER — Other Ambulatory Visit (HOSPITAL_BASED_OUTPATIENT_CLINIC_OR_DEPARTMENT_OTHER): Payer: Self-pay

## 2023-11-26 ENCOUNTER — Other Ambulatory Visit: Payer: Self-pay

## 2023-12-15 ENCOUNTER — Other Ambulatory Visit: Payer: Self-pay

## 2023-12-15 ENCOUNTER — Ambulatory Visit (INDEPENDENT_AMBULATORY_CARE_PROVIDER_SITE_OTHER): Admitting: Family Medicine

## 2023-12-15 ENCOUNTER — Encounter: Payer: Self-pay | Admitting: Neurology

## 2023-12-15 ENCOUNTER — Encounter (INDEPENDENT_AMBULATORY_CARE_PROVIDER_SITE_OTHER): Payer: Self-pay | Admitting: Family Medicine

## 2023-12-15 VITALS — BP 106/68 | HR 61 | Temp 98.0°F | Ht 65.0 in | Wt 181.0 lb

## 2023-12-15 DIAGNOSIS — E559 Vitamin D deficiency, unspecified: Secondary | ICD-10-CM | POA: Diagnosis not present

## 2023-12-15 DIAGNOSIS — E88819 Insulin resistance, unspecified: Secondary | ICD-10-CM

## 2023-12-15 DIAGNOSIS — Z6830 Body mass index (BMI) 30.0-30.9, adult: Secondary | ICD-10-CM | POA: Diagnosis not present

## 2023-12-15 DIAGNOSIS — E669 Obesity, unspecified: Secondary | ICD-10-CM | POA: Diagnosis not present

## 2023-12-15 DIAGNOSIS — E6609 Other obesity due to excess calories: Secondary | ICD-10-CM

## 2023-12-15 MED ORDER — VITAMIN D (ERGOCALCIFEROL) 1.25 MG (50000 UNIT) PO CAPS
50000.0000 [IU] | ORAL_CAPSULE | ORAL | 1 refills | Status: DC
  Filled 2023-12-15: qty 4, 28d supply, fill #0

## 2023-12-15 NOTE — Progress Notes (Signed)
 Office: [phone]  /  Fax: [phone]  WEIGHT SUMMARY AND BIOMETRICS  Anthropometric Measurements Height: 5\' 5"  (1.651 m) Weight: 181 lb (82.1 kg) BMI (Calculated): 30.12 Weight at Last Visit: 183lb Weight Lost Since Last Visit: 2lb Weight Gained Since Last Visit: 0 Starting Weight: 191lb Total Weight Loss (lbs): 10 lb (4.536 kg) Peak Weight: 306lb   Body Composition  Body Fat %: 40.4 % Fat Mass (lbs): 73.4 lbs Muscle Mass (lbs): 102.8 lbs Total Body Water  (lbs): 79.8 lbs Visceral Fat Rating : 9   Other Clinical Data Fasting: no Labs: no Today's Visit #: 4 Starting Date: 10/01/23    Chief Complaint: OBESITY    History of Present Illness Donna Mccarthy is a 48 year old female who presents for obesity treatment and progress assessment.  She follows a pescetarian eating plan, adhering to it about eighty percent of the time. Her physical activity includes walking and aerobics for twenty minutes, five to seven times per week. She has lost two pounds in the last two to three weeks and a total of ten pounds since starting the program.  During a recent family reunion, she faced challenges maintaining her diet due to travel and dining out. She managed to find suitable options at a hotel breakfast and practiced portion control at a restaurant by sharing a meal. She experiences difficulty with cravings, particularly for sweets after meals, which she attributes to insulin  levels.  She is currently taking vitamin D  supplements without any issues. Her current diet includes packaged tuna and salmon due to the rising cost of seafood, and she occasionally uses protein powder in her meals. She is mindful of her protein intake and is trying to balance it with her carbohydrate consumption. She is also attempting to reduce her coffee intake, substituting with tea, and continues to drink lemon water  throughout the day.      PHYSICAL EXAM:  Blood pressure 106/68, pulse 61,  temperature 98 F (36.7 C), height 5\' 5"  (1.651 m), weight 181 lb (82.1 kg), last menstrual period 12/15/2023, SpO2 100%. Body mass index is 30.12 kg/m.  DIAGNOSTIC DATA REVIEWED:  BMET    Component Value Date/Time   NA 141 10/01/2023 0916   K 4.5 10/01/2023 0916   CL 105 10/01/2023 0916   CO2 20 10/01/2023 0916   GLUCOSE 83 10/01/2023 0916   GLUCOSE 88 01/14/2023 1119   BUN 7 10/01/2023 0916   CREATININE 0.83 10/01/2023 0916   CALCIUM  9.4 10/01/2023 0916   GFRNONAA >60 01/14/2023 1110   GFRAA 129 04/27/2020 1503   Lab Results  Component Value Date   HGBA1C 5.3 10/01/2023   HGBA1C 5.4 04/20/2019   Lab Results  Component Value Date   INSULIN  6.3 10/01/2023   Lab Results  Component Value Date   TSH 28.100 (H) 10/01/2023   CBC    Component Value Date/Time   WBC 4.0 10/01/2023 0916   WBC 3.5 (L) 04/21/2023 1543   RBC 4.34 10/01/2023 0916   RBC 3.90 04/21/2023 1543   HGB 11.5 10/01/2023 0916   HCT 37.4 10/01/2023 0916   PLT 208 10/01/2023 0916   MCV 86 10/01/2023 0916   MCH 26.5 (L) 10/01/2023 0916   MCH 26.7 04/21/2023 1543   MCHC 30.7 (L) 10/01/2023 0916   MCHC 31.3 04/21/2023 1543   RDW 13.9 10/01/2023 0916   Iron Studies    Component Value Date/Time   IRON 93 04/27/2020 1503   TIBC 297 04/27/2020 1503   FERRITIN 47 04/27/2020  1503   IRONPCTSAT 31 04/27/2020 1503   Lipid Panel     Component Value Date/Time   CHOL 175 10/01/2023 0916   TRIG 31 10/01/2023 0916   HDL 76 10/01/2023 0916   CHOLHDL 2.3 10/01/2023 0916   CHOLHDL 3.2 10/04/2022 0244   VLDL 6 10/04/2022 0244   LDLCALC 92 10/01/2023 0916   Hepatic Function Panel     Component Value Date/Time   PROT 7.1 10/01/2023 0916   ALBUMIN 4.3 10/01/2023 0916   AST 19 10/01/2023 0916   ALT 16 10/01/2023 0916   ALKPHOS 76 10/01/2023 0916   BILITOT 0.8 10/01/2023 0916   BILIDIR 0.13 01/26/2023 0836   IBILI NOT CALCULATED 10/24/2019 1604      Component Value Date/Time   TSH 28.100 (H)  10/01/2023 0916   Nutritional Lab Results  Component Value Date   VD25OH 28.8 (L) 10/01/2023   VD25OH 32.6 05/20/2023   VD25OH 21.7 (L) 08/25/2022     Assessment and Plan Assessment & Plan Obesity Obesity management with a pescetarian diet and regular exercise. She has lost 10 pounds since starting the program, with 2 pounds lost in the last 2-3 weeks. Challenges include maintaining diet during travel and managing cravings for sweets. Discussed strategies for portion control and increasing protein intake to manage hunger and cravings. Emphasized the importance of protein intake before exercise and the benefits of shorter, more frequent exercise sessions to avoid increased hunger. Discussed the role of insulin  resistance in weight management and the importance of controlling it to prevent diabetes. Provided a recipe for low-calorie chocolate protein pudding to help manage sweet cravings. - Continue pescetarian diet with emphasis on protein intake. - Incorporate strategies for managing cravings, such as low-calorie chocolate protein pudding. - Adjust exercise routine to shorter, more frequent sessions. - Follow up in 2-3 weeks.  Insulin  resistance Insulin  resistance is contributing to weight management challenges. Explained the role of insulin  resistance in hunger and fat storage. Discussed the importance of weight loss and exercise in improving insulin  sensitivity. Emphasized the need for protein-focused meals to manage insulin  levels and reduce hunger. Explained that insulin  resistance is a genetic variant affecting a third of Americans and can lead to diabetes if not managed. Highlighted the importance of exercise and dietary choices in managing insulin  resistance. - Continue weight loss efforts to improve insulin  sensitivity. - Focus on protein intake to manage insulin  levels. - Educate on the impact of different types of carbohydrates on insulin  resistance.  Vitamin D  deficiency Vitamin  D deficiency is being managed with supplementation. No reported issues with current vitamin D  regimen. - Continue current vitamin D  supplementation. - Ensure prescription refill for vitamin D .     I have personally spent 40 minutes total time today in preparation, patient care, and documentation for this visit, including the following: review of clinical lab tests; review of medical history, review of IR and its pathophysiology and how this affects her weight and future health   She was informed of the importance of frequent follow up visits to maximize her success with intensive lifestyle modifications for her multiple health conditions.    Jasmine Mesi, MD

## 2023-12-16 ENCOUNTER — Other Ambulatory Visit: Payer: Self-pay

## 2023-12-21 ENCOUNTER — Other Ambulatory Visit (HOSPITAL_COMMUNITY): Payer: Self-pay

## 2023-12-21 ENCOUNTER — Other Ambulatory Visit: Payer: Self-pay

## 2023-12-21 ENCOUNTER — Encounter (HOSPITAL_COMMUNITY): Payer: Self-pay

## 2023-12-25 ENCOUNTER — Other Ambulatory Visit: Payer: Self-pay

## 2023-12-25 MED ORDER — THYROID 60 MG PO TABS
60.0000 mg | ORAL_TABLET | Freq: Every day | ORAL | 6 refills | Status: DC
  Filled 2023-12-25: qty 30, 30d supply, fill #0
  Filled 2023-12-30: qty 30, 30d supply, fill #1

## 2023-12-25 MED ORDER — THYROID 120 MG PO TABS
120.0000 mg | ORAL_TABLET | Freq: Every day | ORAL | 6 refills | Status: DC
  Filled 2023-12-25: qty 30, 30d supply, fill #0

## 2023-12-30 ENCOUNTER — Other Ambulatory Visit: Payer: Self-pay

## 2023-12-30 ENCOUNTER — Other Ambulatory Visit: Payer: Self-pay | Admitting: Neurology

## 2023-12-30 ENCOUNTER — Other Ambulatory Visit (HOSPITAL_COMMUNITY): Payer: Self-pay

## 2023-12-30 ENCOUNTER — Telehealth: Payer: Self-pay | Admitting: Neurology

## 2023-12-30 ENCOUNTER — Encounter (HOSPITAL_COMMUNITY): Payer: Self-pay

## 2023-12-30 DIAGNOSIS — G43909 Migraine, unspecified, not intractable, without status migrainosus: Secondary | ICD-10-CM

## 2023-12-30 MED ORDER — NURTEC 75 MG PO TBDP: 75.0000 mg | ORAL_TABLET | ORAL | 11 refills | Status: DC

## 2023-12-30 NOTE — Telephone Encounter (Signed)
 [facility] ) Asking for clarity about medication  .  (NURTEC)  Pharmacy was unclear about instruction  there were 2 ,   Pharmacy want to know should pt take 1 tablet a day or 1 tablet every time  pt feel Headache. Aaron Aas

## 2023-12-31 ENCOUNTER — Other Ambulatory Visit (HOSPITAL_COMMUNITY): Payer: Self-pay

## 2023-12-31 NOTE — Telephone Encounter (Signed)
 Last seen 11/19/23, next appt 06/13/24 Unable to track registry  Dispenses  No dispenses within 180 days of the adherence period (since 01/05/2023)  Patient Instructions  Discontinue topiramate  Start propranolol  XR 60 mg nightly Start Nurtec 75 mg every other day Discontinue Maxalt Consider Excedrin or Aleve as needed for headaches Follow-up in 6 months or sooner if worse.  I do not see this in continue. Refusing refill

## 2024-01-01 ENCOUNTER — Other Ambulatory Visit: Payer: Self-pay

## 2024-01-03 NOTE — Progress Notes (Signed)
 SUBJECTIVE: Discussed the use of AI scribe software for clinical note transcription with the patient, who gave verbal consent to proceed.  Chief Complaint: Obesity  Interim History: She is up 2 pounds since last visit  Donna Mccarthy is here to discuss her progress with her obesity treatment plan. She is on the [employer] and states she is following her eating plan approximately 70% of the time. She states she is exercising walking 20 minutes 1-2 times per week.  Donna Mccarthy is a 48 year old female who presents for a follow-up on her obesity treatment plan.  She follows a pescetarian diet plan approximately 70% of the time and engages in physical activity by walking 20 minutes one to two times per week. She has lost a total of eight pounds but has gained two pounds since her last visit. Her diet includes fish like tuna, salmon, and flounder, vegetarian options such as vegetarian meatballs and Brussels sprouts, almond milk, and fruits like strawberries, raspberries, and blueberries. She avoids milk products due to stomach upset and uses natural fruit bars as a dessert option.  Yogurt triggers her migraines, and she has tried switching to silk yogurt, which contains 190 calories, but it is not effective. She experiences sickness with eggs due to low tolerance and is hesitant to reintroduce chicken into her diet after a long period of abstinence.  She is currently taking Wellbutrin  and has been switched from Topamax  to propranolol  for migraine management. She started propranolol  about two weeks ago but continues to experience daily headaches. She suspects yogurt as a trigger for her headaches and has identified a specific yogurt brand that exacerbates her symptoms. She was previously on Topamax  for years at a dose of 75 mg and reports that it helped with her headaches and cravings. She also takes Nurtec for migraines, with instructions to take propranolol  daily and Nurtec every other day, but  reports that this regimen is not effective.  She has a Mirena  IUD placed in January and is due for a thyroid  level check, which has not been done since her medication adjustment. She experiences fatigue, irregular periods, and difficulty sleeping. She is also taking vitamin D . OBJECTIVE: Visit Diagnoses: Problem List Items Addressed This Visit     Vitamin D  deficiency   Relevant Medications   Vitamin D , Ergocalciferol , (DRISDOL ) 1.25 MG (50000 UNIT) CAPS capsule   Other Visit Diagnoses       Insulin  resistance    -  Primary     Other Specified Feeding or Eating Disorder, Emotional Eating Behaviors         Class 1 obesity starting BMI 31.78 date/10/01/23         BMI 30.0-30.9,adult current BMI 30.6         Migraine Chronic migraines with daily headaches. Recent switch from topiramate  to propranolol , but propranolol  is not providing adequate relief. Identification of yogurt as a trigger. Discussion of potential return to topiramate  for better headache control and to address cravings. Topiramate  may also improve sleep quality, aiding headache management. - Avoid yogurt as it triggers migraines. - Discuss with neurologist the possibility of resuming topiramate  for migraine prophylaxis. - Continue propranolol  until further evaluation by neurologist. - Consider contacting neurologist for earlier appointment if headaches persist.  Obesity Obesity management with a pescetarian diet plan, followed 70% of the time. Weight loss of 8 pounds overall, but a 2-pound gain this visit. Discussion of dietary options due to intolerance to yogurt and eggs. Consideration of a vegetarian  diet during the day and pescetarian for dinner. Discussion of cravings potentially related to discontinuation of topiramate . Topiramate  may help with cravings and weight management. - Continue pescetarian diet with consideration of vegetarian options during the day. - Avoid yogurt and eggs due to intolerance. - Consider  reintroduction of topiramate  to help with cravings and weight management.  Thyroid  disorder Thyroid  disorder with recent medication adjustment. Thyroid  levels not rechecked since adjustment. Potential contribution to current symptoms including fatigue and weight changes. Rechecking thyroid  levels is necessary to ensure proper management. - Contact endocrinologist to schedule thyroid  level recheck.  Irregular menstruation Irregular menstruation potentially related to perimenopause and recent IUD placement. Symptoms include irregular periods and fatigue. Discussion of hormonal changes and their impact on overall health. Adequate sleep, nutrition, and exercise are recommended to manage perimenopausal symptoms. - Ensure adequate sleep, nutrition, and exercise to manage perimenopausal symptoms.  Follow-up Follow-up appointments scheduled to monitor progress and adjust treatment plans as necessary. - Follow up with Alston Jerry on June 9th. - Schedule follow-up appointment for July 7th at 3 PM.  Vitals Temp: 98.7 F (37.1 C) BP: 99/66 Pulse Rate: 70 SpO2: 100 %   Anthropometric Measurements Height: 5\' 5"  (1.651 m) Weight: 183 lb (83 kg) BMI (Calculated): 30.45 Weight at Last Visit: 181 lb Weight Lost Since Last Visit: 0 Weight Gained Since Last Visit: 2 lb Starting Weight: 191 lb Total Weight Loss (lbs): 8 lb (3.629 kg) Peak Weight: 306 lb   Body Composition  Body Fat %: 40.5 % Fat Mass (lbs): 74.4 lbs Muscle Mass (lbs): 103.8 lbs Total Body Water  (lbs): 77.4 lbs Visceral Fat Rating : 9   Other Clinical Data Fasting: Yes Labs: No Today's Visit #: 5 Starting Date: 10/01/23     ASSESSMENT AND PLAN:  Diet: Gizella is currently in the action stage of change. As such, her goal is to continue with weight loss efforts. She has agreed to [employer] and Vegetarian Plan.  Exercise: Shane has been instructed to work up to a goal of 150 minutes of combined cardio and  strengthening exercise per week for weight loss and overall health benefits.   Behavior Modification:  We discussed the following Behavioral Modification Strategies today: increasing lean protein intake, decreasing simple carbohydrates, increasing vegetables, increase H2O intake, increase high fiber foods, meal planning and cooking strategies, avoiding temptations, and planning for success. We discussed various medication options to help Brihany with her weight loss efforts and we both agreed to continue to work on nutritional and behavioral strategies to promote weight loss.  Discuss resumption of Topamax  for both migraine prophylaxis in addition to decreasing cravings.  Return in about 4 weeks (around 02/01/2024).Aaron Aas She was informed of the importance of frequent follow up visits to maximize her success with intensive lifestyle modifications for her multiple health conditions.  Attestation Statements:   Reviewed by clinician on day of visit: allergies, medications, problem list, medical history, surgical history, family history, social history, and previous encounter notes.   Time spent on visit including pre-visit chart review and post-visit care and charting was 25 minutes.    Sundeep Destin, PA-C

## 2024-01-04 ENCOUNTER — Encounter (INDEPENDENT_AMBULATORY_CARE_PROVIDER_SITE_OTHER): Payer: Self-pay | Admitting: Physician Assistant

## 2024-01-04 ENCOUNTER — Other Ambulatory Visit: Payer: Self-pay

## 2024-01-04 ENCOUNTER — Ambulatory Visit (INDEPENDENT_AMBULATORY_CARE_PROVIDER_SITE_OTHER): Admitting: Physician Assistant

## 2024-01-04 VITALS — BP 99/66 | HR 70 | Temp 98.7°F | Ht 65.0 in | Wt 183.0 lb

## 2024-01-04 DIAGNOSIS — E66811 Obesity, class 1: Secondary | ICD-10-CM

## 2024-01-04 DIAGNOSIS — Z6830 Body mass index (BMI) 30.0-30.9, adult: Secondary | ICD-10-CM

## 2024-01-04 DIAGNOSIS — F5089 Other specified eating disorder: Secondary | ICD-10-CM | POA: Diagnosis not present

## 2024-01-04 DIAGNOSIS — E039 Hypothyroidism, unspecified: Secondary | ICD-10-CM | POA: Diagnosis not present

## 2024-01-04 DIAGNOSIS — E559 Vitamin D deficiency, unspecified: Secondary | ICD-10-CM | POA: Diagnosis not present

## 2024-01-04 DIAGNOSIS — E88819 Insulin resistance, unspecified: Secondary | ICD-10-CM

## 2024-01-04 DIAGNOSIS — E6609 Other obesity due to excess calories: Secondary | ICD-10-CM

## 2024-01-04 MED ORDER — VITAMIN D (ERGOCALCIFEROL) 1.25 MG (50000 UNIT) PO CAPS
50000.0000 [IU] | ORAL_CAPSULE | ORAL | 1 refills | Status: DC
  Filled 2024-01-04 – 2024-01-21 (×2): qty 4, 28d supply, fill #0

## 2024-01-05 ENCOUNTER — Other Ambulatory Visit: Payer: Self-pay

## 2024-01-05 MED ORDER — LEVOTHYROXINE SODIUM 125 MCG PO TABS
250.0000 ug | ORAL_TABLET | Freq: Every day | ORAL | 3 refills | Status: DC
  Filled 2024-01-05: qty 60, 30d supply, fill #0
  Filled 2024-02-11: qty 60, 30d supply, fill #1

## 2024-01-07 ENCOUNTER — Other Ambulatory Visit: Payer: Self-pay

## 2024-01-08 ENCOUNTER — Other Ambulatory Visit: Payer: Self-pay

## 2024-01-21 ENCOUNTER — Other Ambulatory Visit: Payer: Self-pay

## 2024-01-21 ENCOUNTER — Other Ambulatory Visit: Payer: Self-pay | Admitting: Neurology

## 2024-01-21 NOTE — Telephone Encounter (Signed)
 Requested Prescriptions   Pending Prescriptions Disp Refills   butalbital -acetaminophen -caffeine  (FIORICET ) 50-325-40 MG tablet 10 tablet 1    Sig: Take 1 tablet by mouth daily as needed for headache.   Last seen: 11/19/23 Next appt: 06/13/24  Last note stated:  Discontinue topiramate  Start propranolol  XR 60 mg nightly Start Nurtec 75 mg every other day Discontinue Maxalt Consider Excedrin or Aleve as needed for headaches Follow-up in 6 months or sooner if worse.   I do not see this in continue.   Dispenses  No dispenses within 180 days of the adherence period (since 01/27/2023)

## 2024-01-23 MED ORDER — BUTALBITAL-APAP-CAFFEINE 50-325-40 MG PO TABS
1.0000 | ORAL_TABLET | Freq: Every day | ORAL | 1 refills | Status: DC | PRN
  Filled 2024-01-23: qty 10, 10d supply, fill #0

## 2024-01-25 ENCOUNTER — Other Ambulatory Visit: Payer: Self-pay

## 2024-01-25 ENCOUNTER — Ambulatory Visit (INDEPENDENT_AMBULATORY_CARE_PROVIDER_SITE_OTHER): Admitting: Adult Health

## 2024-01-25 ENCOUNTER — Encounter (INDEPENDENT_AMBULATORY_CARE_PROVIDER_SITE_OTHER): Payer: Self-pay | Admitting: Adult Health

## 2024-01-25 ENCOUNTER — Other Ambulatory Visit (HOSPITAL_COMMUNITY): Payer: Self-pay

## 2024-01-25 VITALS — BP 97/60 | HR 55 | Temp 98.6°F | Ht 65.0 in | Wt 186.0 lb

## 2024-01-25 DIAGNOSIS — E669 Obesity, unspecified: Secondary | ICD-10-CM | POA: Diagnosis not present

## 2024-01-25 DIAGNOSIS — Z6831 Body mass index (BMI) 31.0-31.9, adult: Secondary | ICD-10-CM

## 2024-01-25 DIAGNOSIS — E559 Vitamin D deficiency, unspecified: Secondary | ICD-10-CM

## 2024-01-25 DIAGNOSIS — E6609 Other obesity due to excess calories: Secondary | ICD-10-CM

## 2024-01-25 DIAGNOSIS — E88819 Insulin resistance, unspecified: Secondary | ICD-10-CM | POA: Diagnosis not present

## 2024-01-25 DIAGNOSIS — Z6830 Body mass index (BMI) 30.0-30.9, adult: Secondary | ICD-10-CM

## 2024-01-25 DIAGNOSIS — E039 Hypothyroidism, unspecified: Secondary | ICD-10-CM

## 2024-01-25 MED ORDER — VITAMIN D (ERGOCALCIFEROL) 1.25 MG (50000 UNIT) PO CAPS
50000.0000 [IU] | ORAL_CAPSULE | ORAL | 1 refills | Status: DC
Start: 2024-01-25 — End: 2024-02-24

## 2024-01-25 NOTE — Progress Notes (Signed)
 WEIGHT SUMMARY AND BIOMETRICS  Vitals Temp: 98.6 F (37 C) BP: 97/60 Pulse Rate: (!) 55 SpO2: 97 %   Anthropometric Measurements Height: 5\' 5"  (1.651 m) Weight: 186 lb (84.4 kg) BMI (Calculated): 30.95 Weight at Last Visit: 181 lb Weight Lost Since Last Visit: 0 Weight Gained Since Last Visit: 3 lb Starting Weight: 191 lb Total Weight Loss (lbs): 5 lb (2.268 kg) Peak Weight: 306 lb   Body Composition  Body Fat %: 40.2 % Fat Mass (lbs): 74.8 lbs Muscle Mass (lbs): 105.6 lbs Total Body Water  (lbs): 77.2 lbs Visceral Fat Rating : 9   Other Clinical Data Fasting: no Labs: no Today's Visit #: 6 Starting Date: 10/01/23    Chief Complaint:   OBESITY Donna Mccarthy is here to discuss her progress with her obesity treatment plan.  She is on the the Pescatarian Plan and the Vegetarian Plan and states she is following her eating plan approximately 70 % of the time.  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 Hypothyroidism, unspecified       LAB: TSH (Collection Date & Time - 01/04/2024 12:27 PM) -                         Value Reference Range       TSH 12.100 H 0.450-4.500 - uIU/mL     Armour Thyroid  120mg , Armour Thyroid  60mg  discontinued. She is currently on Synthroid  125mcg: 2 tabs daily  Subjective:   1. Insulin  resistance Lab Results  Component Value Date   HGBA1C 5.3 10/01/2023   HGBA1C 5.0 10/04/2022   HGBA1C 5.4 04/20/2019     Latest Reference Range & Units 10/01/23 09:16  INSULIN  2.6 - 24.9 uIU/mL 6.3   A1c and Insulin  Levels are stable She is not currently on any antidiabetic therapy She denies polyphagia  2. Vitamin D  deficiency  Latest Reference Range & Units 05/20/23 14:58 10/01/23 09:16  Vitamin D , 25-Hydroxy  30.0 - 100.0 ng/mL 32.6 28.8 (L)  (L): Data is abnormally low  Vit D Level well below goal of 50-70 She is on weekly Ergocalciferol - denies N/V/Muscle Weakness  3. Hypothyroidism   Latest Reference Range & Units 10/01/23 09:16  TSH 0.450 - 4.500 uIU/mL 28.100 (H)  Triiodothyronine (T3) 71 - 180 ng/dL 92  G2,XBMW(UXLKGM) 0.10 - 1.77 ng/dL 2.72 (L)  (H): Data is abnormally high (L): Data is abnormally low  01/04/2024 Endocrinology/Dr. Ronelle Coffee OV Notes: - 1. Hypothyroidism, unspecified       LAB: TSH (Collection Date & Time - 01/04/2024 12:27 PM) -                         Value Reference Range       TSH 12.100 H 0.450-4.500 - uIU/mL     Armour Thyroid  120mg , Armour Thyroid  60mg  discontinued. She is currently on Synthroid  : 2 tabs daily She denies Tiredness. More sensitivity to cold. Constipation. Dry skin. Weight gain. Puffy face. Hoarse voice. Coarse hair and skin.  Assessment & Plan   1. Insulin  resistance Continue healthy eating and increase daily activity  2. Vitamin D  deficiency Refill Vitamin D , Ergocalciferol , (DRISDOL ) 1.25 MG (50000 UNIT) CAPS capsule Take 1 capsule (50,000 Units total)  by mouth every 7 (seven) days. Dispense: 4 capsule, Refills: 1 ordered   3. Hypothyroidism  F/u with established Endocrinologist  4. BMI 30.0-30.9,adult current BMI 31.0  Donna Mccarthy is currently in the action stage of change. As such, her goal is to continue with weight loss efforts. She has agreed to the [employer] and the Vegetarian Plan.   Exercise goals: All adults should avoid inactivity. Some physical activity is better than none, and adults who participate in any amount of physical activity gain some health benefits. Adults should also include muscle-strengthening activities that involve all major muscle groups on 2 or more days a week.  Behavioral modification strategies: increasing lean protein intake, decreasing simple carbohydrates, increasing vegetables,  increasing water  intake, no skipping meals, meal planning and cooking strategies, keeping healthy foods in the home, ways to avoid boredom eating, and planning for success.  [name] has agreed to follow-up with our clinic in 4 weeks. She was informed of the importance of frequent follow-up visits to maximize her success with intensive lifestyle modifications for her multiple health conditions.   Check Fasting Labs at next OV  Objective:   Blood pressure 97/60, pulse (!) 55, temperature 98.6 F (37 C), height 5\' 5"  (1.651 m), weight 186 lb (84.4 kg), SpO2 97%. Body mass index is 30.95 kg/m.  General: Cooperative, alert, well developed, in no acute distress. HEENT: Conjunctivae and lids unremarkable. Cardiovascular: Regular rhythm.  Lungs: Normal work of breathing. Neurologic: No focal deficits.   Lab Results  Component Value Date   CREATININE 0.83 10/01/2023   BUN 7 10/01/2023   NA 141 10/01/2023   K 4.5 10/01/2023   CL 105 10/01/2023   CO2 20 10/01/2023   Lab Results  Component Value Date   ALT 16 10/01/2023   AST 19 10/01/2023   ALKPHOS 76 10/01/2023   BILITOT 0.8 10/01/2023   Lab Results  Component Value Date   HGBA1C 5.3 10/01/2023   HGBA1C 5.0 10/04/2022   HGBA1C 5.4 04/20/2019   Lab Results  Component Value Date   INSULIN  6.3 10/01/2023   Lab Results  Component Value Date   TSH 28.100 (H) 10/01/2023   Lab Results  Component Value Date   CHOL 175 10/01/2023   HDL 76 10/01/2023   LDLCALC 92 10/01/2023   TRIG 31 10/01/2023   CHOLHDL 2.3 10/01/2023   Lab Results  Component Value Date   VD25OH 28.8 (L) 10/01/2023   VD25OH 32.6 05/20/2023   VD25OH 21.7 (L) 08/25/2022   Lab Results  Component Value Date   WBC 4.0 10/01/2023   HGB 11.5 10/01/2023   HCT 37.4 10/01/2023   MCV 86 10/01/2023   PLT 208 10/01/2023   Lab Results  Component Value Date   IRON 93 04/27/2020   TIBC 297 04/27/2020   FERRITIN 47 04/27/2020   Attestation Statements:    Reviewed by clinician on day of visit: allergies, medications, problem list, medical history, surgical history, family history, social history, and previous encounter notes.  I have reviewed the above documentation for accuracy and completeness, and I agree with the above. -  Donna Mccarthy Glasser, NP-C

## 2024-01-28 ENCOUNTER — Other Ambulatory Visit: Payer: Self-pay

## 2024-01-28 ENCOUNTER — Telehealth: Admitting: Physician Assistant

## 2024-01-28 DIAGNOSIS — R300 Dysuria: Secondary | ICD-10-CM

## 2024-01-28 MED ORDER — SULFAMETHOXAZOLE-TRIMETHOPRIM 800-160 MG PO TABS
1.0000 | ORAL_TABLET | Freq: Two times a day (BID) | ORAL | 0 refills | Status: AC
  Filled 2024-01-28: qty 10, 5d supply, fill #0

## 2024-01-28 NOTE — Progress Notes (Signed)
 I have spent 5 minutes in review of e-visit questionnaire, review and updating patient chart, medical decision making and response to patient.   Piedad Climes, PA-C

## 2024-01-28 NOTE — Progress Notes (Addendum)
 Thank you for claifying for me. With the updated information, we can started treatment over the e-visit.  E-Visit for Urinary Problems  We are sorry that you are not feeling well.  Here is how we plan to help!  Based on what you shared with me it looks like you most likely have a simple urinary tract infection.  A UTI (Urinary Tract Infection) is a bacterial infection of the bladder.  Most cases of urinary tract infections are simple to treat but a key part of your care is to encourage you to drink plenty of fluids and watch your symptoms carefully.  I have prescribed Bactrim  DS One tablet twice a day for 5 days.  Your symptoms should gradually improve. Call us  if the burning in your urine worsens, you develop worsening fever, back pain or pelvic pain or if your symptoms do not resolve after completing the antibiotic.  Urinary tract infections can be prevented by drinking plenty of water  to keep your body hydrated.  Also be sure when you wipe, wipe from front to back and don't hold it in!  If possible, empty your bladder every 4 hours.  HOME CARE Drink plenty of fluids Compete the full course of the antibiotics even if the symptoms resolve Remember, when you need to go.go. Holding in your urine can increase the likelihood of getting a UTI! GET HELP RIGHT AWAY IF: You cannot urinate You get a high fever Worsening back pain occurs You see blood in your urine You feel sick to your stomach or throw up You feel like you are going to pass out  MAKE SURE YOU  Understand these instructions. Will watch your condition. Will get help right away if you are not doing well or get worse.   Thank you for choosing an e-visit.  Your e-visit answers were reviewed by a board certified advanced clinical practitioner to complete your personal care plan. Depending upon the condition, your plan could have included both over the counter or prescription medications.  Please review your pharmacy choice.  Make sure the pharmacy is open so you can pick up prescription now. If there is a problem, you may contact your provider through [employer] and have the prescription routed to another pharmacy.  Your safety is important to us . If you have drug allergies check your prescription carefully.   For the next 24 hours you can use MyChart to ask questions about today's visit, request a non-urgent call back, or ask for a work or school excuse. You will get an email in the next two days asking about your experience. I hope that your e-visit has been valuable and will speed your recovery.

## 2024-01-28 NOTE — Addendum Note (Signed)
 Addended by: Farris Hong on: 01/28/2024 02:22 PM   Modules accepted: Orders, Level of Service

## 2024-02-22 ENCOUNTER — Ambulatory Visit (INDEPENDENT_AMBULATORY_CARE_PROVIDER_SITE_OTHER): Admitting: Physician Assistant

## 2024-02-23 NOTE — Progress Notes (Signed)
 SUBJECTIVE: Discussed the use of AI scribe software for clinical note transcription with the patient, who gave verbal consent to proceed.  Chief Complaint: Obesity  Interim History: She is up 3 lbs since her last visit   Luvada is here to discuss her progress with her obesity treatment plan. She is on the [employer] and states she is following her eating plan approximately 80 % of the time. She states she is exercising 20-30 minutes 7 times per week.  TINYA CADOGAN is a 48 year old female with obesity who presents for follow-up of her obesity treatment plan.  She has a history of insulin  resistance, hypothyroidism, vitamin D  deficiency, emotional eating behaviors, and a remote history of a stroke. She was recently switched from Armour Thyroid  to Synthroid  (levothyroxine ) 125 mcg, two tablets daily by Endocrinology- Dr Tommas. She experiences ongoing cravings for sweets and occasional hunger, particularly in the mornings. Her diet includes yogurt, boiled eggs, microwavable egg whites, plant-based sausage, bag salad with tuna, and air-fried foods. Despite these efforts, she still experiences cravings and hunger.  She has a history of migraines for which she was previously prescribed propranolol , but she has stopped taking it. Her migraines are less frequent and primarily occur around her menstrual cycle. She has also been on topiramate  in the past but is not currently using it.  Her work schedule is hectic, involving travel to multiple locations weekly, which she finds stressful and disruptive to her routine. She has difficulty sleeping, often waking up in the middle of the night and feeling wired before bed. She uses chamomile tea and melatonin to aid sleep but still struggles with restlessness. She attempts to maintain a consistent sleep schedule and has tried various relaxation techniques.  She engages in 20-30 minutes of exercise daily at home and has recently started eating chicken  once a week. She feels stressed due to her work schedule and is trying to find time for self-care activities.  She experiences puffiness in her ankles despite wearing compression hose during the week. She notes an increase in salty and sweet food intake, which may contribute to fluid retention. OBJECTIVE: Visit Diagnoses: Problem List Items Addressed This Visit     Hypothyroidism   Relevant Orders   T3   T4, free   TSH   Vitamin D  deficiency   Relevant Medications   Vitamin D , Ergocalciferol , (DRISDOL ) 1.25 MG (50000 UNIT) CAPS capsule   Other Relevant Orders   VITAMIN D  25 Hydroxy (Vit-D Deficiency, Fractures)   Other Visit Diagnoses       Insulin  resistance    -  Primary   Relevant Orders   CMP14+EGFR   Hemoglobin A1c   Insulin , random     Other fatigue       Relevant Orders   Vitamin B12     Stress         Class 1 obesity starting BMI 31.78 date/10/01/23       Relevant Orders   Lipid Panel With LDL/HDL Ratio     BMI 30.0-30.9,adult Current BMI 31.6         Obesity She is managing obesity with a history of insulin  resistance, hypothyroidism, and emotional eating behaviors contributing to weight challenges. She experiences cravings for sweets and occasional hunger despite dietary adjustments. Stress from her job and irregular sleep patterns may also contribute. She maintains muscle mass but has increased adipose tissue. Topiramate  was previously used but is not currently preferred due to potential side effects. -  Continue current dietary plan focusing on reducing cravings and managing hunger. - Encourage stress management techniques, including potential job accommodations to reduce travel. - Recommend regular exercise, aiming for 20-30 minutes daily. - Consider reintroducing topiramate  if cravings persist. - Encourage self-care activities and taking time off on weekends.  Hypothyroidism She was recently switched from Armour Thyroid  to Synthroid  (levothyroxine ) 125 mcg  twice daily. Her thyroid  levels are not yet stabilized, affecting weight and energy levels. The switch aims to better manage thyroid  function and associated symptoms. - Continue Synthroid  125 mcg twice daily. - Monitor thyroid  function tests as part of lab work.  Peripheral Edema She reports ankle puffiness, likely related to fluid retention from dietary salt intake and warm weather. She wears compression hose during the week. The edema is likely exacerbated by dietary habits and thyroid  regulation issues. - Check kidney function as part of lab work. - Advise reducing sodium intake to 1800-2000 mg per day. - Continue wearing compression hose.  Sleep Disturbance She experiences difficulty sleeping, with frequent awakenings and insufficient rest. Stress and irregular work schedules contribute to sleep issues. She uses melatonin and chamomile tea but still faces challenges. Sleep yoga and sunlight exposure were discussed as additional strategies to improve sleep quality. - Encourage consistent sleep schedule and bedtime routine. - Recommend wind down yoga by Shelba for relaxation before bed. - Advise exposure to sunlight for 10-15 minutes daily to regulate melatonin production. - Continue melatonin and chamomile tea as needed.  Vitamin D  Deficiency She is on supplementation for vitamin D  deficiency, which is important for overall health and may impact energy levels. - Refill vitamin D  prescription. Meds ordered this encounter  Medications   Vitamin D , Ergocalciferol , (DRISDOL ) 1.25 MG (50000 UNIT) CAPS capsule    Sig: Take 1 capsule (50,000 Units total) by mouth every 7 (seven) days.    Dispense:  4 capsule    Refill:  1    General Health Maintenance Efforts to manage health include diet, exercise, and stress management, along with addressing vitamin D  deficiency and thyroid  issues. Regular monitoring and adjustments are necessary to optimize health outcomes. - Encourage regular physical  activity and healthy eating habits. - Promote stress reduction techniques and self-care activities.  Follow-up Scheduled for follow-up in six weeks with lab work to monitor various health parameters. Results will guide further management decisions. - Order lab tests: B12, CMP, A1c, insulin , lipids, thyroid  function, and vitamin D . - Schedule follow-up appointment for August 25th at 4 PM. - Contact if lab results indicate any significant issues.  Vitals Temp: 98.8 F (37.1 C) BP: 115/72 Pulse Rate: 63 SpO2: 100 %   Anthropometric Measurements Height: 5' 5 (1.651 m) Weight: 189 lb (85.7 kg) BMI (Calculated): 31.45 Weight at Last Visit: 186 lb Weight Lost Since Last Visit: 0 Weight Gained Since Last Visit: 3 lb Starting Weight: 191 lb Total Weight Loss (lbs): 2 lb (0.907 kg)   Body Composition  Body Fat %: 41.4 % Fat Mass (lbs): 78.6 lbs Muscle Mass (lbs): 105.6 lbs Total Body Water  (lbs): 81.8 lbs Visceral Fat Rating : 9   Other Clinical Data Fasting: No Labs: No Today's Visit #: 7 Starting Date: 10/01/23     ASSESSMENT AND PLAN:  Diet: Jeanet is currently in the action stage of change. As such, her goal is to continue with weight loss efforts. She has agreed to [employer].  Exercise: Sharry has been instructed to work up to a goal of 150 minutes of combined cardio  and strengthening exercise per week for weight loss and overall health benefits.   Behavior Modification:  We discussed the following Behavioral Modification Strategies today: increasing lean protein intake, decreasing simple carbohydrates, increasing vegetables, increase H2O intake, increase high fiber foods, no skipping meals, meal planning and cooking strategies, avoiding temptations, and planning for success. We discussed various medication options to help Quintina with her weight loss efforts and we both agreed to continue current treatment plan and consider resuming topiramate  for cravings  and may also aid sleep.  Follow up in 5-6 weeks.  She was informed of the importance of frequent follow up visits to maximize her success with intensive lifestyle modifications for her multiple health conditions.  Attestation Statements:   Reviewed by clinician on day of visit: allergies, medications, problem list, medical history, surgical history, family history, social history, and previous encounter notes.   Time spent on visit including pre-visit chart review and post-visit care and charting was 33 minutes.    Ramona Slinger, PA-C

## 2024-02-24 ENCOUNTER — Ambulatory Visit (INDEPENDENT_AMBULATORY_CARE_PROVIDER_SITE_OTHER): Admitting: Physician Assistant

## 2024-02-24 ENCOUNTER — Encounter (INDEPENDENT_AMBULATORY_CARE_PROVIDER_SITE_OTHER): Payer: Self-pay | Admitting: Physician Assistant

## 2024-02-24 ENCOUNTER — Other Ambulatory Visit: Payer: Self-pay

## 2024-02-24 VITALS — BP 115/72 | HR 63 | Temp 98.8°F | Ht 65.0 in | Wt 189.0 lb

## 2024-02-24 DIAGNOSIS — E88819 Insulin resistance, unspecified: Secondary | ICD-10-CM | POA: Diagnosis not present

## 2024-02-24 DIAGNOSIS — E559 Vitamin D deficiency, unspecified: Secondary | ICD-10-CM | POA: Diagnosis not present

## 2024-02-24 DIAGNOSIS — E6609 Other obesity due to excess calories: Secondary | ICD-10-CM | POA: Diagnosis not present

## 2024-02-24 DIAGNOSIS — E66811 Obesity, class 1: Secondary | ICD-10-CM

## 2024-02-24 DIAGNOSIS — Z6831 Body mass index (BMI) 31.0-31.9, adult: Secondary | ICD-10-CM

## 2024-02-24 DIAGNOSIS — R5383 Other fatigue: Secondary | ICD-10-CM | POA: Diagnosis not present

## 2024-02-24 DIAGNOSIS — E039 Hypothyroidism, unspecified: Secondary | ICD-10-CM | POA: Diagnosis not present

## 2024-02-24 DIAGNOSIS — F439 Reaction to severe stress, unspecified: Secondary | ICD-10-CM

## 2024-02-24 DIAGNOSIS — Z6830 Body mass index (BMI) 30.0-30.9, adult: Secondary | ICD-10-CM

## 2024-02-24 MED ORDER — VITAMIN D (ERGOCALCIFEROL) 1.25 MG (50000 UNIT) PO CAPS
50000.0000 [IU] | ORAL_CAPSULE | ORAL | 1 refills | Status: DC
  Filled 2024-02-24: qty 4, 28d supply, fill #0

## 2024-02-25 LAB — CMP14+EGFR
ALT: 17 IU/L (ref 0–32)
AST: 22 IU/L (ref 0–40)
Albumin: 4.3 g/dL (ref 3.9–4.9)
Alkaline Phosphatase: 64 IU/L (ref 44–121)
BUN/Creatinine Ratio: 11 (ref 9–23)
BUN: 8 mg/dL (ref 6–24)
Bilirubin Total: 0.8 mg/dL (ref 0.0–1.2)
CO2: 18 mmol/L — ABNORMAL LOW (ref 20–29)
Calcium: 9.2 mg/dL (ref 8.7–10.2)
Chloride: 104 mmol/L (ref 96–106)
Creatinine, Ser: 0.76 mg/dL (ref 0.57–1.00)
Globulin, Total: 2.6 g/dL (ref 1.5–4.5)
Glucose: 82 mg/dL (ref 70–99)
Potassium: 4.6 mmol/L (ref 3.5–5.2)
Sodium: 141 mmol/L (ref 134–144)
Total Protein: 6.9 g/dL (ref 6.0–8.5)
eGFR: 98 mL/min/1.73 (ref >59)

## 2024-02-25 LAB — HEMOGLOBIN A1C
Est. average glucose Bld gHb Est-mCnc: 105 mg/dL
Hgb A1c MFr Bld: 5.3 % (ref 4.8–5.6)

## 2024-02-25 LAB — LIPID PANEL WITH LDL/HDL RATIO
Cholesterol, Total: 176 mg/dL (ref 100–199)
HDL: 68 mg/dL (ref >39)
LDL Chol Calc (NIH): 100 mg/dL — ABNORMAL HIGH (ref 0–99)
LDL/HDL Ratio: 1.5 ratio (ref 0.0–3.2)
Triglycerides: 39 mg/dL (ref 0–149)
VLDL Cholesterol Cal: 8 mg/dL (ref 5–40)

## 2024-02-25 LAB — T4, FREE: Free T4: 1.52 ng/dL (ref 0.82–1.77)

## 2024-02-25 LAB — VITAMIN B12: Vitamin B-12: 774 pg/mL (ref 232–1245)

## 2024-02-25 LAB — TSH: TSH: 14.1 u[IU]/mL — ABNORMAL HIGH (ref 0.450–4.500)

## 2024-02-25 LAB — T3: T3, Total: 95 ng/dL (ref 71–180)

## 2024-02-25 LAB — INSULIN, RANDOM: INSULIN: 5.8 u[IU]/mL (ref 2.6–24.9)

## 2024-02-25 LAB — VITAMIN D 25 HYDROXY (VIT D DEFICIENCY, FRACTURES): Vit D, 25-Hydroxy: 31.8 ng/mL (ref 30.0–100.0)

## 2024-02-26 ENCOUNTER — Other Ambulatory Visit: Payer: Self-pay

## 2024-02-26 MED ORDER — LEVOTHYROXINE SODIUM 137 MCG PO TABS
137.0000 ug | ORAL_TABLET | Freq: Every day | ORAL | 3 refills | Status: DC
  Filled 2024-02-26: qty 60, 60d supply, fill #0
  Filled 2024-04-27: qty 60, 60d supply, fill #1

## 2024-03-21 ENCOUNTER — Other Ambulatory Visit: Payer: Self-pay

## 2024-03-21 ENCOUNTER — Ambulatory Visit: Admitting: Family Medicine

## 2024-03-21 ENCOUNTER — Encounter: Payer: Self-pay | Admitting: Family Medicine

## 2024-03-21 VITALS — BP 124/80 | HR 76 | Temp 98.7°F | Wt 193.2 lb

## 2024-03-21 DIAGNOSIS — J069 Acute upper respiratory infection, unspecified: Secondary | ICD-10-CM

## 2024-03-21 LAB — POC COVID19/FLU A&B COMBO
Covid Antigen, POC: NEGATIVE
Influenza A Antigen, POC: NEGATIVE
Influenza B Antigen, POC: NEGATIVE

## 2024-03-21 LAB — POCT RAPID STREP A (OFFICE): Rapid Strep A Screen: NEGATIVE

## 2024-03-21 MED ORDER — METHYLPREDNISOLONE 4 MG PO TBPK
ORAL_TABLET | ORAL | 0 refills | Status: DC
  Filled 2024-03-21: qty 21, 6d supply, fill #0

## 2024-03-21 NOTE — Progress Notes (Signed)
   Name: Donna Mccarthy   Date of Visit: 03/21/24   Date of last visit with me: Visit date not found   CHIEF COMPLAINT:  Chief Complaint  Patient presents with   Acute Visit    Sinus infection. Symptoms started Saturday, got a really bad headache, coughing, chest pain, chills, body ache, fatigue.        HPI:  Discussed the use of AI scribe software for clinical note transcription with the patient, who gave verbal consent to proceed.  History of Present Illness   Donna Mccarthy is a 48 year old female who presents with cough, fatigue, and body aches.  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 Initially, she experienced a headache that persisted despite rest. The following day, she developed a slight, non-productive cough after attending church, which has since been accompanied by fatigue, chills, body aches, and a lack of energy.  The cough worsens at night, causing her to sit up and resulting in difficulty sleeping. She experiences chills but denies any fever. There is a sensation of nasal drainage down her throat and a sore throat.  She experiences chest pain described as a winded feeling that occurs with deep breaths. No significant shortness of breath beyond this sensation.         OBJECTIVE:       05/20/2023    1:46 PM  Depression screen PHQ 2/9  Decreased Interest 0  Down, Depressed, Hopeless 1  PHQ - 2 Score 1  Altered sleeping 1  Tired, decreased energy 1  Change in appetite 2  Feeling bad or failure about yourself  0  Trouble concentrating 1  Moving slowly or fidgety/restless 0  Suicidal thoughts 0  PHQ-9 Score 6  Difficult doing work/chores Somewhat difficult     BP Readings from Last 3 Encounters:  03/21/24 124/80  02/24/24 115/72  01/25/24 97/60    BP 124/80   Pulse 76   Temp 98.7 F (37.1 C)   Wt 193 lb 3.2 oz (87.6 kg)   SpO2 94%   BMI 32.15 kg/m    Physical Exam   HEENT: Mild throat soreness.      Physical  Exam Constitutional:      Appearance: Normal appearance.  HENT:     Right Ear: Tympanic membrane, ear canal and external ear normal.     Left Ear: Tympanic membrane, ear canal and external ear normal.     Nose: Congestion and rhinorrhea present.     Mouth/Throat:     Pharynx: Posterior oropharyngeal erythema present. No oropharyngeal exudate.  Cardiovascular:     Rate and Rhythm: Normal rate.     Pulses: Normal pulses.     Heart sounds: Normal heart sounds.  Pulmonary:     Effort: Pulmonary effort is normal. No respiratory distress.     Breath sounds: No wheezing.  Neurological:     Mental Status: She is alert.     ASSESSMENT/PLAN:   Assessment & Plan Viral upper respiratory tract infection    Assessment and Plan    Acute viral upper respiratory infection Symptoms suggest viral etiology causing significant discomfort and fatigue. - Prescribed steroid burst to reduce inflammation, advised daytime administration to avoid insomnia. - Instructed to monitor symptoms and report if not improved by Friday. - If symptoms persist or worsen, especially cough, order chest x-ray and consider antibiotic.         [name] A. Vita MD [facility]

## 2024-03-24 ENCOUNTER — Telehealth: Payer: Self-pay | Admitting: *Deleted

## 2024-03-24 ENCOUNTER — Ambulatory Visit
Admission: RE | Admit: 2024-03-24 | Discharge: 2024-03-24 | Disposition: A | Discharge status: Home / Self Care | Source: Ambulatory Visit | Attending: Family Medicine | Admitting: Family Medicine

## 2024-03-24 DIAGNOSIS — R059 Cough, unspecified: Secondary | ICD-10-CM | POA: Diagnosis not present

## 2024-03-24 DIAGNOSIS — J069 Acute upper respiratory infection, unspecified: Secondary | ICD-10-CM

## 2024-03-24 NOTE — Telephone Encounter (Signed)
 Patient called and she is wheezing and still having pains in her chest from coughing (you saw her 8/4) she wondered if she could possibly get an inhaler called in?

## 2024-03-24 NOTE — Telephone Encounter (Signed)
 Patient advised. She will have CXR done.

## 2024-03-25 ENCOUNTER — Ambulatory Visit: Payer: Self-pay | Admitting: Family Medicine

## 2024-03-29 ENCOUNTER — Other Ambulatory Visit: Payer: Self-pay

## 2024-03-30 ENCOUNTER — Other Ambulatory Visit: Payer: Self-pay

## 2024-04-10 NOTE — Progress Notes (Unsigned)
 SUBJECTIVE: Discussed the use of AI scribe software for clinical note transcription with the patient, who gave verbal consent to proceed.  Chief Complaint: Obesity  Interim History: She is up 5 lbs since last visit.    Donna Mccarthy is here to discuss her progress with her obesity treatment plan. She is on the Vegetarian Plan and states she is following her eating plan approximately 25 % of the time. She states she is not exercising.  Donna Mccarthy is a 48 year old female who presents for follow-up on her obesity treatment plan.  She reports not adhering to her vegetarian diet plan. She attributes this to a busy schedule, returning to school on August 14th, and financial constraints affecting her ability to purchase healthy foods. She mentions eating the same foods repeatedly and experiencing increased cravings, disrupted sleep, and financial difficulties in affording healthier options.  She has attempted to incorporate meat into her diet but feels nauseous and unwell after consuming it. Her typical breakfast includes yogurt, plant-based sausages, and fruit, but she struggles to maintain this routine due to time constraints and lack of grocery shopping. She recently purchased yogurt but did not have time to eat it due to a busy morning. For lunch, she tries to eat salads provided at work but often lacks protein and is tempted by desserts. We discussed considering using [employer] protein shots to supplement her protein intake.  She is taking four online classes, which adds to her stress and time constraints. She is currently on Wellbutrin  and has no history of kidney stones or glaucoma. She uses an IUD for birth control. She previously tried topiramate  but stopped due to side effects she attributed to yogurt with artificial sweeteners rather than to topiramate . She has a history of thyroid  issues and was switched from Amour thyroid  to Synthroid .  Her recent labs showed CO2, kidney and liver function,  LDL cholesterol, HDL cholesterol, triglycerides, and A1c values were reviewed. She takes vitamin D  50,000 units and a multivitamin with B12.  OBJECTIVE: Visit Diagnoses: Problem List Items Addressed This Visit     Hypothyroidism   Vitamin D  deficiency   Relevant Medications   Vitamin D , Ergocalciferol , (DRISDOL ) 1.25 MG (50000 UNIT) CAPS capsule   Other Visit Diagnoses       Insulin  resistance    -  Primary   Relevant Medications   topiramate  (TOPAMAX ) 25 MG tablet     Stress       Relevant Medications   topiramate  (TOPAMAX ) 25 MG tablet     Class 1 obesity starting BMI 31.78 date/10/01/23       Relevant Medications   topiramate  (TOPAMAX ) 25 MG tablet     BMI 32.0-32.9,adult Current BMI 32.7         Obesity with cravings  Reports difficulty adhering to a vegetarian diet due to time constraints, financial limitations, and increased stress from returning to school. Experiences cravings and binge eating, eating when not really hungry, particularly in the evenings.  Previous trial of topiramate  was discontinued due to suspected side effects, later attributed to yogurt with artificial sweeteners. Considered other pharmacological options such as phentermine and Contrave, but these were not suitable due to potential side effects and cost. Discussed potential cost of injectables like Zepbound and Wegovy , and the upcoming oral form of Wegovy , which may be less expensive. Patient denies history of glaucoma or kidney stones. She was informed of side effects and that topiramate  is teratogenic. She currently uses IUD for birth control. She  does feel topiramate  may have helped with cravings and would like to try it again.  - Initiate topiramate  25 mg daily, to be taken in the evening with the evening meal. Increase to 50 mg after one week if tolerated, and then to 75 mg if well tolerated. Discussed to discontinue if any concerns for increased HA's, brain fog or concerns for word finding problems.   - Discussed dietary strategies including convenient vegetarian options, protein sources, and meal planning to manage cravings and maintain a balanced diet. - Explore local food banks and resources for affordable healthy food options. - Consider using [employer] website for vegetarian recipes and meal planning.   Insulin  Resistance Last fasting insulin  was 5.8- improved and at goal range. A1c was 5.3- at goal. Polyphagia:Yes Medication(s): None Lab Results  Component Value Date   HGBA1C 5.3 02/24/2024   HGBA1C 5.3 10/01/2023   HGBA1C 5.0 10/04/2022   HGBA1C 5.4 04/20/2019   Lab Results  Component Value Date   INSULIN  5.8 02/24/2024   INSULIN  6.3 10/01/2023    Plan: Start Topiramate  25 mg nightly and titrate for effect/Monitor closely for any SE Continue working on nutrition plan to decrease simple carbohydrates, increase lean proteins and exercise to promote weight loss, improve glycemic control and prevent progression to Type 2 diabetes.   Hypothyroidism Ongoing management challenges with previous issues in thyroid  medication adjustments. Currently on Synthroid  after being switched from amour thyroid . Missed recent appointment for thyroid  function re-evaluation. - Coordinate thyroid  function re-evaluation appointment with other medical appointments on May 09, 2024. - continue current synthroid  137 mcg daily  Vitamin D  deficiency Ongoing supplementation with 50,000 units of vitamin D . No N/V or muscle weakness with Ergocalciferol .  Last vitamin D  Lab Results  Component Value Date   VD25OH 31.8 02/24/2024   Low vitamin D  levels can be associated with adiposity and may result in leptin resistance and weight gain. Also associated with fatigue.  Currently on vitamin D  supplementation without any adverse effects such as nausea, vomiting or muscle weakness.  - Refill vitamin D - Ergocalciferol  50,000 units once weekly Meds ordered this encounter  Medications   topiramate   (TOPAMAX ) 25 MG tablet    Sig: Take 1 tablet (25 mg total) by mouth daily.    Dispense:  60 tablet    Refill:  0   Vitamin D , Ergocalciferol , (DRISDOL ) 1.25 MG (50000 UNIT) CAPS capsule    Sig: Take 1 capsule (50,000 Units total) by mouth every 7 (seven) days.    Dispense:  4 capsule    Refill:  1    Dyslipidemia Slightly elevated LDL levels. HDL and triglycerides are within optimal ranges. Previously on atorvastatin .  Last lipids Lab Results  Component Value Date   CHOL 176 02/24/2024   HDL 68 02/24/2024   LDLCALC 100 (H) 02/24/2024   TRIG 39 02/24/2024   CHOLHDL 2.3 10/01/2023    - Monitor lipid levels and continue current management. Previously on atorvastatin .  May need to resume if levels to not improve with increased nutrition adherence and exercise.  Continue to work on nutrition plan -decreasing simple carbohydrates, increasing lean proteins, decreasing saturated fats and cholesterol , avoiding trans fats and exercise as able to promote weight loss, improve lipids and decrease cardiovascular risks.  Eating disorder/emotional eating/stress Donna Mccarthy has had issues with stress/emotional eating. She is experiencing more stress with working and then taking 4 classes this semester.   Currently this is poorly controlled. Overall mood is stable. Medication(s): Wellbutrin  XL 300 mg  daily per PCP for depressed mood. No SE. No SI/HI.   Plan: Start Topiramate  25 mg nightly and titrate for effect. Monitor for SE Continue Wellbutrin  per PCP  Vitals Temp: 98.1 F (36.7 C) BP: 117/81 Pulse Rate: 61 SpO2: 100 %   Anthropometric Measurements Height: 5' 5 (1.651 m) Weight: 196 lb (88.9 kg) BMI (Calculated): 32.62 Weight at Last Visit: 189 lb Weight Lost Since Last Visit: 0 Weight Gained Since Last Visit: 5 lb Starting Weight: 191 lb Total Weight Loss (lbs): 0 lb (0 kg)   Body Composition  Body Fat %: 42.9 % Fat Mass (lbs): 84.4 lbs Muscle Mass (lbs): 106.6 lbs Total  Body Water  (lbs): 85 lbs Visceral Fat Rating : 10   Other Clinical Data Fasting: No Labs: No Today's Visit #: 8 Starting Date: 10/01/23     ASSESSMENT AND PLAN:  Diet: Sherion is currently in the action stage of change. As such, her goal is to get back to weight loss efforts. She has agreed to [employer].  Exercise: Lechelle has been instructed to work up to a goal of 150 minutes of combined cardio and strengthening exercise per week and that some exercise is better than none for weight loss and overall health benefits.   Behavior Modification:  We discussed the following Behavioral Modification Strategies today: increasing lean protein intake, decreasing simple carbohydrates, increasing vegetables, increase H2O intake, increase high fiber foods, no skipping meals, meal planning and cooking strategies, avoiding temptations, and planning for success. We discussed various medication options to help Roshawna with her weight loss efforts and we both agreed to restart topiramate  and monitor for side effects .  Return in about 4 weeks (around 05/09/2024).SABRA She was informed of the importance of frequent follow up visits to maximize her success with intensive lifestyle modifications for her multiple health conditions.  Attestation Statements:   Reviewed by clinician on day of visit: allergies, medications, problem list, medical history, surgical history, family history, social history, and previous encounter notes.   Time spent on visit including pre-visit chart review and post-visit care and charting was 45 minutes.    Shion Bluestein, PA-C

## 2024-04-11 ENCOUNTER — Encounter (INDEPENDENT_AMBULATORY_CARE_PROVIDER_SITE_OTHER): Payer: Self-pay | Admitting: Physician Assistant

## 2024-04-11 ENCOUNTER — Ambulatory Visit (INDEPENDENT_AMBULATORY_CARE_PROVIDER_SITE_OTHER): Admitting: Physician Assistant

## 2024-04-11 ENCOUNTER — Other Ambulatory Visit: Payer: Self-pay

## 2024-04-11 VITALS — BP 117/81 | HR 61 | Temp 98.1°F | Ht 65.0 in | Wt 196.0 lb

## 2024-04-11 DIAGNOSIS — Z6832 Body mass index (BMI) 32.0-32.9, adult: Secondary | ICD-10-CM

## 2024-04-11 DIAGNOSIS — E785 Hyperlipidemia, unspecified: Secondary | ICD-10-CM

## 2024-04-11 DIAGNOSIS — E88819 Insulin resistance, unspecified: Secondary | ICD-10-CM

## 2024-04-11 DIAGNOSIS — E66811 Obesity, class 1: Secondary | ICD-10-CM | POA: Diagnosis not present

## 2024-04-11 DIAGNOSIS — E039 Hypothyroidism, unspecified: Secondary | ICD-10-CM | POA: Diagnosis not present

## 2024-04-11 DIAGNOSIS — E559 Vitamin D deficiency, unspecified: Secondary | ICD-10-CM

## 2024-04-11 DIAGNOSIS — Z8673 Personal history of transient ischemic attack (TIA), and cerebral infarction without residual deficits: Secondary | ICD-10-CM

## 2024-04-11 DIAGNOSIS — F439 Reaction to severe stress, unspecified: Secondary | ICD-10-CM

## 2024-04-11 MED ORDER — VITAMIN D (ERGOCALCIFEROL) 1.25 MG (50000 UNIT) PO CAPS
50000.0000 [IU] | ORAL_CAPSULE | ORAL | 1 refills | Status: DC
  Filled 2024-04-11 – 2024-04-15 (×3): qty 4, 28d supply, fill #0

## 2024-04-11 MED ORDER — TOPIRAMATE 25 MG PO TABS
25.0000 mg | ORAL_TABLET | Freq: Every day | ORAL | 0 refills | Status: DC
  Filled 2024-04-11 – 2024-04-15 (×3): qty 60, 60d supply, fill #0

## 2024-04-15 ENCOUNTER — Other Ambulatory Visit: Payer: Self-pay

## 2024-04-29 ENCOUNTER — Other Ambulatory Visit: Payer: Self-pay

## 2024-05-09 ENCOUNTER — Ambulatory Visit (INDEPENDENT_AMBULATORY_CARE_PROVIDER_SITE_OTHER): Admitting: Physician Assistant

## 2024-05-13 DIAGNOSIS — E039 Hypothyroidism, unspecified: Secondary | ICD-10-CM | POA: Diagnosis not present

## 2024-05-13 DIAGNOSIS — K909 Intestinal malabsorption, unspecified: Secondary | ICD-10-CM | POA: Diagnosis not present

## 2024-05-13 DIAGNOSIS — N926 Irregular menstruation, unspecified: Secondary | ICD-10-CM | POA: Diagnosis not present

## 2024-05-13 DIAGNOSIS — E049 Nontoxic goiter, unspecified: Secondary | ICD-10-CM | POA: Diagnosis not present

## 2024-05-13 DIAGNOSIS — E669 Obesity, unspecified: Secondary | ICD-10-CM | POA: Diagnosis not present

## 2024-05-13 DIAGNOSIS — R140 Abdominal distension (gaseous): Secondary | ICD-10-CM | POA: Diagnosis not present

## 2024-05-15 NOTE — Progress Notes (Unsigned)
 SUBJECTIVE: Discussed the use of AI scribe software for clinical note transcription with the patient, who gave verbal consent to proceed.  Chief Complaint: Obesity  Interim History: She is up 2 lbs since her last visit.   Donna Mccarthy is here to discuss her progress with her obesity treatment plan. She is on the [employer] and states she is following her eating plan approximately 80 % of the time. She states she is not exercising  but has high NEAT in her work.   Donna Mccarthy is a 48 year old female who presents for follow-up on her obesity treatment plan.  She has a history of insulin  resistance, hyperlipidemia, vitamin D  deficiency, stress with emotional eating behaviors, and hypothyroidism.        Her endocrinologist recently adjusted her levothyroxine  dosage to 300 mcg daily. She reports consistent taking of medication and avoidance of medications which may interact with the levothyroxine . There are concerns for possible interference with her adsorption or metabolism of the medication.   Despite changes in medication, her thyroid  levels remain unstable, and she has been tested for celiac disease, though results are pending.  She experiences persistent hunger despite following dietary recommendations, and experiences bloating and stomach discomfort if she eats excessively. Her diet includes yogurt, sausage, cheese, and sometimes eggs. She has been on a low dose of Topamax  but continues to experience food cravings. Her energy levels fluctuate, with some days being better than others, but generally she feels fatigued. She also has difficulty sleeping, often waking up in the middle of the night and struggling to return to sleep.  She has a history of a prolonged menstrual period lasting the entire month of September, which recently stopped. She has not been on any GLP-1 medications due to insurance coverage issues.  Her past medical history includes a stroke, and she has undergone an  MRI of the brain over the past year. She has not had a colonoscopy but plans to schedule one. Her family history includes an uncle with colon cancer. She has previously seen a gastroenterologist for a suspected hiatal hernia, but no significant findings were reported.  She underwent EGD in 2021. She has not had any gastric bypass surgery or other gastrointestinal procedures.   OBJECTIVE: Visit Diagnoses: Problem List Items Addressed This Visit     Vitamin D  deficiency   Other Visit Diagnoses       Insulin  resistance    -  Primary     Hyperlipidemia, unspecified hyperlipidemia type         Stress         Other Specified Feeding or Eating Disorder, Emotional Eating Behaviors         Class 1 obesity starting BMI 31.78 date/10/01/23         Obesity with insulin  resistance and emotional eating behaviors Obesity with associated insulin  resistance and emotional eating behaviors. Current treatment plan includes dietary modifications. Discussion about GLP-1 receptor agonists like Zepbound and Wegovy , but cost is a barrier. Consideration of a low-carb or gluten-free diet to address potential malabsorption issues. - Start low-carb +/- gluten-free diet - continue to monitor response to new increased levothyroxine  and consider repeat of IC. Last IC with RMR 1440 on 11/02/23.   Insulin  Resistance Last fasting insulin  was 5.8. A1c was 5.3- both at goal levels. Polyphagia:Yes Medication(s): Topiramate  25 mg daily No SE, but does not seem to help with cravings at current dose.  Lab Results  Component Value Date   HGBA1C 5.3  02/24/2024   HGBA1C 5.3 10/01/2023   HGBA1C 5.0 10/04/2022   HGBA1C 5.4 04/20/2019   Lab Results  Component Value Date   INSULIN  5.8 02/24/2024   INSULIN  6.3 10/01/2023    Plan: Continue Topiramate  25 mg daily and consider increasing dose  Continue working on nutrition plan to decrease simple carbohydrates, increase lean proteins and exercise to promote weight loss,  improve glycemic control and prevent progression to Type 2 diabetes.    Vitamin D  Deficiency Vitamin D  is not at goal of 50.  Most recent vitamin D  level was 31.8. She is on  prescription ergocalciferol  50,000 IU weekly. No N/V or muscle weakness with Ergocalciferol   Lab Results  Component Value Date   VD25OH 31.8 02/24/2024   VD25OH 28.8 (L) 10/01/2023   VD25OH 32.6 05/20/2023    Plan: Continue and refill  prescription ergocalciferol  50,000 IU weekly Low vitamin D  levels can be associated with adiposity and may result in leptin resistance and weight gain. Also associated with fatigue.  Currently on vitamin D  supplementation without any adverse effects such as nausea, vomiting or muscle weakness.   Gastrointestinal symptoms with possible malabsorption Gastrointestinal symptoms including bloating and abdominal discomfort, possibly related to malabsorption. Differential includes celiac disease, lactose intolerance, and other GI conditions. Previous GI evaluation for hiatal hernia was negative. Consideration of gluten-free diet to address symptoms. Discussion about the need for a colonoscopy for routine screening and due to family history of colon cancer. Does not appear to have maladsorption otherwise with weight loss or severe vitamin deficiencies however.  - Consider gluten-free diet - Schedule colonoscopy - Contact gastroenterologist for further evaluation  Hypothyroidism with  ? poor levothyroxine  absorption Hypothyroidism with suspected poor absorption of levothyroxine , requiring high doses. Endocrinologist is investigating potential causes, including celiac disease and  ? pituitary dysfunction. Recent labs for celiac disease are pending. Discussion about potential pituitary issues affecting thyroid  function. - Follow up on celiac disease lab results - Discuss potential pituitary dysfunction with endocrinologist   Menstrual irregularity (prolonged menstruation) Prolonged  menstruation noted for the entire month of September. Potential hormonal imbalance discussed with endocrinologist. Further evaluation may be needed if symptoms persist.   Sleep disturbance (insomnia) Insomnia with difficulty maintaining sleep. No specific stressors identified. Work on sleep hygiene and monitor as thyroid  issues addressed.   Vitals Temp: 98.3 F (36.8 C) BP: 118/73 Pulse Rate: 65 SpO2: 97 %   Anthropometric Measurements Height: 5' 5 (1.651 m) Weight: 198 lb (89.8 kg) BMI (Calculated): 32.95 Weight at Last Visit: 196 lb Weight Lost Since Last Visit: 0 Weight Gained Since Last Visit: 2 lb Starting Weight: 191 lb Total Weight Loss (lbs): 0 lb (0 kg)   Body Composition  Body Fat %: 42.7 % Fat Mass (lbs): 84.6 lbs Muscle Mass (lbs): 107.8 lbs Total Body Water  (lbs): 83.4 lbs Visceral Fat Rating : 10   Other Clinical Data Fasting: No Labs: No Today's Visit #: 9 Starting Date: 10/01/23     ASSESSMENT AND PLAN:  Diet: Kerensa is currently in the action stage of change. As such, her goal is to continue with weight loss efforts. She has agreed to [employer].  Exercise: Makayli has been instructed to work up to a goal of 150 minutes of combined cardio and strengthening exercise per week and that some exercise is better than none for weight loss and overall health benefits.   Behavior Modification:  We discussed the following Behavioral Modification Strategies today: increasing lean protein intake, decreasing simple carbohydrates, increasing  vegetables, increase H2O intake, increase high fiber foods, no skipping meals, meal planning and cooking strategies, avoiding temptations, and planning for success. We discussed various medication options to help Shaquia with her weight loss efforts and we both agreed to continue current treatment plan.  Return in about 4 weeks (around 06/13/2024).SABRA She was informed of the importance of frequent follow up visits to  maximize her success with intensive lifestyle modifications for her multiple health conditions.  Attestation Statements:   Reviewed by clinician on day of visit: allergies, medications, problem list, medical history, surgical history, family history, social history, and previous encounter notes.   Time spent on visit including pre-visit chart review and post-visit care and charting was 45 minutes.    Luvern Mischke, PA-C

## 2024-05-16 ENCOUNTER — Encounter (INDEPENDENT_AMBULATORY_CARE_PROVIDER_SITE_OTHER): Payer: Self-pay | Admitting: Physician Assistant

## 2024-05-16 ENCOUNTER — Other Ambulatory Visit: Payer: Self-pay

## 2024-05-16 ENCOUNTER — Ambulatory Visit (INDEPENDENT_AMBULATORY_CARE_PROVIDER_SITE_OTHER): Admitting: Physician Assistant

## 2024-05-16 ENCOUNTER — Other Ambulatory Visit (HOSPITAL_COMMUNITY): Payer: Self-pay

## 2024-05-16 VITALS — BP 118/73 | HR 65 | Temp 98.3°F | Ht 65.0 in | Wt 198.0 lb

## 2024-05-16 DIAGNOSIS — E88819 Insulin resistance, unspecified: Secondary | ICD-10-CM

## 2024-05-16 DIAGNOSIS — Z6832 Body mass index (BMI) 32.0-32.9, adult: Secondary | ICD-10-CM

## 2024-05-16 DIAGNOSIS — F439 Reaction to severe stress, unspecified: Secondary | ICD-10-CM

## 2024-05-16 DIAGNOSIS — E785 Hyperlipidemia, unspecified: Secondary | ICD-10-CM | POA: Diagnosis not present

## 2024-05-16 DIAGNOSIS — E559 Vitamin D deficiency, unspecified: Secondary | ICD-10-CM | POA: Diagnosis not present

## 2024-05-16 DIAGNOSIS — E039 Hypothyroidism, unspecified: Secondary | ICD-10-CM | POA: Diagnosis not present

## 2024-05-16 DIAGNOSIS — G479 Sleep disorder, unspecified: Secondary | ICD-10-CM | POA: Diagnosis not present

## 2024-05-16 DIAGNOSIS — K30 Functional dyspepsia: Secondary | ICD-10-CM

## 2024-05-16 DIAGNOSIS — Z6833 Body mass index (BMI) 33.0-33.9, adult: Secondary | ICD-10-CM

## 2024-05-16 DIAGNOSIS — R109 Unspecified abdominal pain: Secondary | ICD-10-CM | POA: Diagnosis not present

## 2024-05-16 DIAGNOSIS — F5089 Other specified eating disorder: Secondary | ICD-10-CM | POA: Diagnosis not present

## 2024-05-16 DIAGNOSIS — N926 Irregular menstruation, unspecified: Secondary | ICD-10-CM

## 2024-05-16 DIAGNOSIS — E66811 Other obesity due to excess calories: Secondary | ICD-10-CM

## 2024-05-16 DIAGNOSIS — R140 Abdominal distension (gaseous): Secondary | ICD-10-CM

## 2024-05-16 MED ORDER — VITAMIN D (ERGOCALCIFEROL) 1.25 MG (50000 UNIT) PO CAPS
50000.0000 [IU] | ORAL_CAPSULE | ORAL | 1 refills | Status: DC
  Filled 2024-05-16 (×2): qty 4, 28d supply, fill #0

## 2024-05-16 MED ORDER — SYNTHROID 300 MCG PO TABS
300.0000 ug | ORAL_TABLET | Freq: Every morning | ORAL | 5 refills | Status: DC
  Filled 2024-05-16: qty 30, 30d supply, fill #0

## 2024-05-17 ENCOUNTER — Other Ambulatory Visit: Payer: Self-pay

## 2024-05-17 ENCOUNTER — Other Ambulatory Visit (HOSPITAL_COMMUNITY): Payer: Self-pay

## 2024-05-17 ENCOUNTER — Other Ambulatory Visit (HOSPITAL_BASED_OUTPATIENT_CLINIC_OR_DEPARTMENT_OTHER): Payer: Self-pay

## 2024-05-17 MED ORDER — LEVOTHYROXINE SODIUM 300 MCG PO TABS
300.0000 ug | ORAL_TABLET | Freq: Every morning | ORAL | 5 refills | Status: DC
  Filled 2024-05-17 (×2): qty 30, 30d supply, fill #0

## 2024-05-18 ENCOUNTER — Other Ambulatory Visit: Payer: Self-pay

## 2024-05-19 ENCOUNTER — Other Ambulatory Visit: Payer: Self-pay

## 2024-05-24 ENCOUNTER — Other Ambulatory Visit: Payer: Self-pay

## 2024-05-24 ENCOUNTER — Other Ambulatory Visit: Payer: Self-pay | Admitting: Neurology

## 2024-05-30 ENCOUNTER — Telehealth: Payer: Self-pay

## 2024-05-30 DIAGNOSIS — G43909 Migraine, unspecified, not intractable, without status migrainosus: Secondary | ICD-10-CM

## 2024-05-30 NOTE — Telephone Encounter (Signed)
 Hello Prescriber!  We are in the process of submitting a prior authorization for your patient for Nurtec. We are reaching out for clinical guidance for the following information to complete the request: Plan requiring documentation of clinical benefit since starting therapy.   Thank you! Pharmacy Team

## 2024-05-31 ENCOUNTER — Other Ambulatory Visit: Payer: Self-pay

## 2024-05-31 MED ORDER — NURTEC 75 MG PO TBDP
75.0000 mg | ORAL_TABLET | ORAL | 11 refills | Status: AC
  Filled 2024-05-31: qty 8, 30d supply, fill #0
  Filled 2024-06-01: qty 16, 30d supply, fill #0
  Filled 2024-08-26: qty 16, 30d supply, fill #1

## 2024-05-31 NOTE — Addendum Note (Signed)
 Addended by: ONEITA HOIST E on: 05/31/2024 02:07 PM   Modules accepted: Orders

## 2024-05-31 NOTE — Telephone Encounter (Signed)
 Pharmacy Patient Advocate Encounter   Received notification from Physician's Office that prior authorization for Nurtec is required/requested.   Insurance verification completed.   The patient is insured through [facility].   Per test claim: PA required; PA submitted to above mentioned insurance via Latent Key/confirmation #/EOC [id number] Status is pending

## 2024-06-01 ENCOUNTER — Other Ambulatory Visit: Payer: Self-pay

## 2024-06-01 ENCOUNTER — Ambulatory Visit: Admitting: Internal Medicine

## 2024-06-01 ENCOUNTER — Other Ambulatory Visit

## 2024-06-01 ENCOUNTER — Encounter: Payer: Self-pay | Admitting: Internal Medicine

## 2024-06-01 VITALS — BP 110/64 | HR 84 | Ht 65.0 in | Wt 197.0 lb

## 2024-06-01 DIAGNOSIS — E039 Hypothyroidism, unspecified: Secondary | ICD-10-CM | POA: Diagnosis not present

## 2024-06-01 NOTE — Patient Instructions (Signed)

## 2024-06-01 NOTE — Progress Notes (Unsigned)
 Name: Donna Mccarthy  MRN/ DOB: [id number], Aug 31, 1975    Age/ Sex: 48 y.o., female    PCP: Randol Dawes, MD   Reason for Endocrinology Evaluation: Hypothyroidism     Date of Initial Endocrinology Evaluation: 06/01/2024     HPI: Donna Mccarthy is a 48 y.o. female with a past medical history of Hypothyroidism, CVA, GERD. The patient presented for initial endocrinology clinic visit on 06/01/2024 for consultative assistance with her Hypothyroidism.   The patient has been diagnosed with hypothyroidism in 2001 No prior XRT or sx of the neck  Patient on Mirena   She also follows with [facility], not able to lose weight  No local neck swelling  Has constipation  Has low energy, doesn't sleep well  Has occasional palpitations  Has tremors  Has mild mood changes  Has occasional heartburn issues   Thyroid  ultrasound in 2020 and 2024 did not reveal any thyroid  nodules.  Synthroid  is cost prohibitive   Levothyroxine  300 mcg daily  Per patient celiac disease screening was negative through her previous endocrinologist office  Mother with Crohn's disease Paternal grandmother with thyroid  disease She is on Vitamin D  once weekly  NO MVI     Patient transferred care from Dr. Tommas HISTORY:  Past Medical History:  Past Medical History:  Diagnosis Date   Allergy    Anemia    Anxiety    Arthritis    arthritis knees   Blood transfusion without reported diagnosis    GERD (gastroesophageal reflux disease)    Hypothyroidism    Neuromuscular disorder (HCC)    Obesity (BMI 30-39.9) 01/05/2019   stroke    Vitamin D  deficiency    Past Surgical History:  Past Surgical History:  Procedure Laterality Date   BIOPSY  04/03/2020   Procedure: BIOPSY;  Surgeon: Eartha Angelia Sieving, MD;  [facility]: [facility];  Service: Gastroenterology;;  antral, body, distal esophagus, mid esophagus   BRACHIOPLASTY Bilateral 07/02/2020   Procedure:  BILATERAL BRACHIOPLASTY WITH LIPOSUCTION;  Surgeon: Elisabeth Craig RAMAN, MD;  Location: [facility];  Service: Plastics;  Laterality: Bilateral;   DILATION AND CURETTAGE OF UTERUS  2022   ESOPHAGOGASTRODUODENOSCOPY (EGD) WITH PROPOFOL  N/A 04/03/2020   Procedure: ESOPHAGOGASTRODUODENOSCOPY (EGD) WITH PROPOFOL ;  Surgeon: Eartha Angelia Sieving, MD;  Location: [facility];  Service: Gastroenterology;  Laterality: N/A;  11:15   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   LIPOSUCTION Bilateral 07/02/2020   Procedure: LIPOSUCTION;  Surgeon: Elisabeth Craig RAMAN, MD;  Location: [facility];  Service: Plastics;  Laterality: Bilateral;   PANNICULECTOMY N/A 08/04/2019   Procedure: PANNICULECTOMY;  Surgeon: Elisabeth Craig RAMAN, MD;  Location: [facility];  Service: Plastics;  Laterality: N/A;  2 hours only, please   TEE WITHOUT CARDIOVERSION N/A 10/07/2022   Procedure: TRANSESOPHAGEAL ECHOCARDIOGRAM (TEE);  Surgeon: Hobart Bennet BRAVO, MD;  Location: [facility];  Service: Cardiovascular;  Laterality: N/A;    Social History:  reports that she has never smoked. She has never used smokeless tobacco. She reports that she does not drink alcohol and does not use drugs. Family History: family history includes Breast cancer in her maternal aunt and maternal grandmother; Colon cancer in her maternal uncle; Colon cancer (age of onset: 13) in her maternal uncle; Colon polyps in her mother; Congestive Heart Failure (age of onset: 17) in her father; Crohn's disease in her mother; Diabetes in her maternal grandmother; Heart attack in her  paternal grandfather; Heart failure in her maternal grandmother and paternal grandfather; Hyperlipidemia in her father; Hypertension in her brother, father, and mother; Stroke in her father; Thyroid  disease in her paternal grandmother; Vitamin D  deficiency in her paternal grandmother.   HOME MEDICATIONS: Allergies as of 06/01/2024       Reactions    Ketorolac Tromethamine Other (See Comments)   felt like whole body was on fire   Penicillins Other (See Comments)   Mother said she had a rash   Celebrex  [celecoxib ] Itching   itching   Compazine  [prochlorperazine ] Other (See Comments)   Out of body experience   Egg Protein-containing Drug Products Other (See Comments)        Medication List        Accurate as of June 01, 2024  8:52 AM. If you have any questions, ask your nurse or doctor.          ACCRUFeR  30 MG Caps Generic drug: Ferric Maltol  Take 1 capsule (30 mg total) by mouth daily.   buPROPion  300 MG 24 hr tablet Commonly known as: Wellbutrin  XL Take 1 tablet (300 mg total) by mouth daily.   butalbital -acetaminophen -caffeine  50-325-40 MG tablet Commonly known as: FIORICET  Take 1 tablet by mouth daily as needed for headache.   fluticasone  50 MCG/ACT nasal spray Commonly known as: FLONASE  Place 2 sprays into both nostrils as needed for allergies.   loratadine 10 MG tablet Commonly known as: CLARITIN Take 10 mg by mouth daily as needed for allergies.   Magnesium 250 MG Tabs Take 250 mg by mouth every other day.   Mirena  (52 MG) 20 MCG/DAY Iud Generic drug: levonorgestrel  Take by intrauterine route.   Nurtec 75 MG Tbdp Generic drug: Rimegepant Sulfate  Take 1 tablet (75 mg total) by mouth every other day.   pantoprazole  40 MG tablet Commonly known as: PROTONIX  Take 1 tablet (40 mg total) by mouth at bedtime.   Synthroid  300 MCG tablet Generic drug: levothyroxine  Take 1 tablet (300 mcg total) by mouth in the morning.   levothyroxine  300 MCG tablet Commonly known as: Synthroid  Take 1 tablet (300 mcg total) by mouth every morning on an empty stomach.   topiramate  25 MG tablet Commonly known as: Topamax  Take 1 tablet (25 mg total) by mouth daily.   Vitamin D  (Ergocalciferol ) 1.25 MG (50000 UNIT) Caps capsule Commonly known as: DRISDOL  Take 1 capsule (50,000 Units total) by mouth every 7  (seven) days.          REVIEW OF SYSTEMS: A comprehensive ROS was conducted with the patient and is negative except as per HPI    OBJECTIVE:  VS: BP 110/64 (BP Location: Left Arm, Patient Position: Sitting, Cuff Size: Large)   Pulse 84   Ht 5' 5 (1.651 m)   Wt 197 lb (89.4 kg)   SpO2 99%   BMI 32.78 kg/m    Wt Readings from Last 3 Encounters:  06/01/24 197 lb (89.4 kg)  05/16/24 198 lb (89.8 kg)  04/11/24 196 lb (88.9 kg)     EXAM: General: Pt appears well and is in NAD  Eyes: External eye exam normal without stare, lid lag or exophthalmos.  EOM intact.  PERRL.  Neck: General: Supple without adenopathy. Thyroid : Thyroid  size normal.  No goiter or nodules appreciated. No thyroid  bruit.  Lungs: Clear with good BS bilat   Heart: Auscultation: RRR.  Abdomen: Soft, nontender  Extremities:  BL LE: No pretibial edema   Mental Status: Judgment, insight: Intact Orientation:  Oriented to time, place, and person Mood and affect: No depression, anxiety, or agitation     DATA REVIEWED: ***    ASSESSMENT/PLAN/RECOMMENDATIONS:   Hypothyroidism:   -Patient is clinically hypothyroid - She has long history of fluctuating TSH - Pt educated extensively on the correct way to take levothyroxine  (first thing in the morning with water , 30 minutes before eating or taking other medications). - Pt encouraged to double dose the following day if she were to miss a dose given long half-life of levothyroxine . - Her previous endocrinologist attempted to prescribe Synthroid  but it was $100 which is because prohibitive -I will try to prescribe Tirosint  , if this is cost prohibitive, I will add liothyronine as a last resort - Anti-TPO antibodies pending   Medications : Levothyroxine  300 mcg daily   Follow-up in 3 months Signed electronically by: Stefano Redgie Butts, MD  [facility] End[facility]  [facility] [address] [place] [place], [place] [address] Phone:  [phone] FAX: [phone]   CC: Randol Dawes, MD [address] [place] [address] Phone: [phone] Fax: [phone]   Return to Endocrinology clinic as below: Future Appointments  Date Time Provider Department Center  06/08/2024  8:45 AM Randol Dawes, MD [facility] [id number]  06/13/2024  9:15 AM Gregg Lek, MD [facility] None  06/13/2024  3:30 PM Rayburn, Elouise Phlegm, PA-C [facility] None

## 2024-06-02 ENCOUNTER — Encounter (HOSPITAL_COMMUNITY): Payer: Self-pay

## 2024-06-02 ENCOUNTER — Other Ambulatory Visit (HOSPITAL_COMMUNITY): Payer: Self-pay

## 2024-06-02 ENCOUNTER — Telehealth: Payer: Self-pay | Admitting: Internal Medicine

## 2024-06-02 ENCOUNTER — Ambulatory Visit: Payer: Self-pay | Admitting: Internal Medicine

## 2024-06-02 ENCOUNTER — Other Ambulatory Visit: Payer: Self-pay

## 2024-06-02 ENCOUNTER — Telehealth: Payer: Self-pay

## 2024-06-02 ENCOUNTER — Encounter (INDEPENDENT_AMBULATORY_CARE_PROVIDER_SITE_OTHER): Payer: Self-pay | Admitting: Physician Assistant

## 2024-06-02 MED ORDER — LEVOTHYROXINE SODIUM 200 MCG PO CAPS
200.0000 ug | ORAL_CAPSULE | Freq: Every day | ORAL | 6 refills | Status: DC
  Filled 2024-06-02 – 2024-06-06 (×2): qty 30, 30d supply, fill #0

## 2024-06-02 MED ORDER — LEVOTHYROXINE SODIUM 300 MCG PO TABS: 300.0000 ug | ORAL_TABLET | ORAL | Status: DC

## 2024-06-02 NOTE — Telephone Encounter (Signed)
 Pharmacy Patient Advocate Encounter  Received notification from [employer] that Prior Authorization for Nurtec has been APPROVED from 06/02/2024 to 11/29/2024. Ran test claim, Copay is $0. This test claim was processed through [facility]- copay amounts may vary at other pharmacies due to pharmacy/plan contracts, or as the patient moves through the different stages of their insurance plan.   PA #/Case ID/Reference #: [id number]

## 2024-06-02 NOTE — Telephone Encounter (Signed)
 Can you please proceed with prior authorization on Tirosint  on this patient ?  She has been on levothyroxine  for years and her TSH has been out of control and difficult to control

## 2024-06-02 NOTE — Telephone Encounter (Signed)
 Pharmacy Patient Advocate Encounter   Received notification from Pt Calls Messages that prior authorization for Tirosint  is required/requested.   Insurance verification completed.   The patient is insured through [facility].   Per test claim: PA required and submitted KEY/EOC/Request #: [id number] due to This drug/product is not covered under the pharmacy benefit. Prior Authorization is not available

## 2024-06-03 ENCOUNTER — Other Ambulatory Visit: Payer: Self-pay

## 2024-06-03 LAB — TSH: TSH: 0.13 m[IU]/L — ABNORMAL LOW

## 2024-06-03 LAB — T4, FREE: Free T4: 2.9 ng/dL — ABNORMAL HIGH (ref 0.8–1.8)

## 2024-06-03 LAB — THYROID PEROXIDASE ANTIBODY: Thyroperoxidase Ab SerPl-aCnc: 494 [IU]/mL — ABNORMAL HIGH (ref <9)

## 2024-06-06 ENCOUNTER — Other Ambulatory Visit: Payer: Self-pay

## 2024-06-06 ENCOUNTER — Other Ambulatory Visit (HOSPITAL_COMMUNITY): Payer: Self-pay

## 2024-06-06 ENCOUNTER — Telehealth: Payer: Self-pay | Admitting: Internal Medicine

## 2024-06-06 ENCOUNTER — Other Ambulatory Visit (INDEPENDENT_AMBULATORY_CARE_PROVIDER_SITE_OTHER): Payer: Self-pay | Admitting: Physician Assistant

## 2024-06-06 DIAGNOSIS — E88819 Insulin resistance, unspecified: Secondary | ICD-10-CM

## 2024-06-06 DIAGNOSIS — E66811 Obesity, class 1: Secondary | ICD-10-CM

## 2024-06-06 DIAGNOSIS — F439 Reaction to severe stress, unspecified: Secondary | ICD-10-CM

## 2024-06-06 MED ORDER — TOPIRAMATE 25 MG PO TABS
50.0000 mg | ORAL_TABLET | Freq: Every day | ORAL | 1 refills | Status: DC
Start: 2024-06-06 — End: 2024-07-12
  Filled 2024-06-06: qty 60, 30d supply, fill #0

## 2024-06-06 MED ORDER — TOPIRAMATE 25 MG PO TABS
50.0000 mg | ORAL_TABLET | Freq: Every day | ORAL | 1 refills | Status: DC
Start: 2024-06-06 — End: 2024-06-06
  Filled 2024-06-06: qty 60, 30d supply, fill #0

## 2024-06-06 NOTE — Telephone Encounter (Signed)
 Patient dropped off patient assistance paperwork.  This was placed in the providers in box at the front desk.  Company is - [employer] on 06/06/24

## 2024-06-07 NOTE — Progress Notes (Unsigned)
 No chief complaint on file.  Donna Mccarthy is a 48 y.o. female who presents for a complete physical.  She is under the care of Dr. Henry for GYN care. Last GYN note we have was from consult with Dr. Latisha 05/2023 to discuss possible ablation for menorrhagia. She had ER visit for prolonged bleeding, treated with Megace .  Hgb had dropped to 8 (via POC test, was 10.4 in ER).  Last CBC we have showed Hgb 11.5 in 09/2023.   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 Per GYN notes, she is due for pap in 2025.  She had IUD replaced in 10/2021.   Lab Results  Component Value Date   WBC 4.0 10/01/2023   HGB 11.5 10/01/2023   HCT 37.4 10/01/2023   MCV 86 10/01/2023   PLT 208 10/01/2023    MRI-negative stroke in 09/2022--hospitalized with slurred speech, blurred vision, diploplia, left facial droop. 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 No abnormal findings on CT, MRI/MRA. Questionable MCA and PCA narrowing noted on CTA of head/neck.  She was started on atorvastatin . At her physical last year she reported stopping it a few months prior as it made her achey, wasn't taking CoQ10 with it.  LDL was 89 when she was taking it. She continues on ASA 81 mg daily.  She had ER visit 01/14/2023 for left-sided facial droop with slurred speech and right-sided weakness. She had negative CT, brain MRI/MRA.  Symptoms and headache resolved. 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    She last saw neuro in April, 2025 and has an appointment next week. In April they replaced topamax  (75 mg daily) with propranolol  (for migraine prevention). She is back on topamax  at 25 mg daily, gettiing through [employer] [place] to help with food cravings.  She was switched from Maxalt to Nurtec, to take every other day. This was recently refilled for a year. They also refill her  fioricet , which isn't needed often ***UPDATE  ***UPDATE MIGRAINES AND TREATMENTS   Depression and Anxiety:  She has been on Wellbutrin  since 08/2022.  Last year she reported that her moods were good overall.  She wasn't seeing a therapist regularly.  She was traveling a lot for work, which caused stress, and didn't have time for the gym when she worked in [place]. ***UPDATE     05/20/2023    1:46 PM 01/26/2023    9:42 AM 11/20/2022    2:25 PM 09/29/2022   11:50 AM 08/25/2022    3:42 PM  Depression screen PHQ 2/9  Decreased Interest 0 0 0 0 1  Down, Depressed, Hopeless 1 0 0 0 1  PHQ - 2 Score 1 0 0 0 2  Altered sleeping 1  0 2 3  Tired, decreased energy 1  0 1 1  Change in appetite 2  0 0 1  Feeling bad or failure about yourself  0  0 0 1  Trouble concentrating 1  0 1 3  Moving slowly or fidgety/restless 0  0 1 3  Suicidal thoughts 0  0 0 0  PHQ-9 Score 6  0 5 14  Difficult doing work/chores Somewhat difficult   Not difficult at all Somewhat difficult      05/20/2023    1:51 PM 09/29/2022   11:49 AM 08/25/2022    3:42 PM 12/25/2021    2:58  PM  GAD 7 : Generalized Anxiety Score  Nervous, Anxious, on Edge 0 1 2 2   Control/stop worrying 1 0 1 1  Worry too much - different things 1 0 0 1  Trouble relaxing 1 1 1 3   Restless 0 1 1 2   Easily annoyed or irritable 0 0 1 3  Afraid - awful might happen 0 0 1 0  Total GAD 7 Score 3 3 7 12   Anxiety Difficulty Not difficult at all Not difficult at all Somewhat difficult Somewhat difficult    Hypothyroidism: She switched from Dr. Tommas to Dr. Sam recently.  She was over-replaced ona dose of 300 mcg daily, and dose was decreased to 300 mcg 6 days/week. She is on generic levothyroxine . Synthroid  was cost prohibitive. They are trying to get approval for Tirosint .  She reported to endo  having low energy, occ palpitations, tremors, doesn't sleep well mild mood changes.  Lab Results  Component Value Date   TSH 0.13 (L) 06/01/2024   Free T4 was elevated. TPO Ab was elevated.   Vitamin D  deficiency: This is monitored by [facility], who also prescribes her weekly vitamin D . Last check in July was at the lower end of normal (improved). She reports compliance with taking weekly medication.  Component Ref Range & Units (hover) 3 mo ago (02/24/24) 8 mo ago (10/01/23) 1 yr ago (05/20/23) 1 yr ago (08/25/22) 2 yr ago (05/15/22) 3 yr ago (05/10/21) 4 yr ago (04/27/20)  Vit D, 25-Hydroxy 31.8 28.8 Low  CM 32.6 CM 21.7 Low  CM 25.8 Low  CM 35.9 CM 26.8 Low  CM     GERD-- ?needing pantoprazole ? *** Uses it prior to a triggering meal, 1-2x/week (last year)*** Denies dysphagia.   Immunization History  Administered Date(s) Administered   Hepatitis B 05/29/2016   Influenza,inj,Quad PF,6+ Mos 05/28/2020   Influenza-Unspecified 05/23/2021, 05/18/2022   PFIZER(Purple Top)SARS-COV-2 Vaccination 10/18/2019, 11/11/2019   Tdap 08/31/2011, 08/25/2022   Gets flu shot yearly for work. Last pap:  last we have is from 2022, gets through GYN Last mammo: 04/2023 through GYN  DEXA: never Colonoscopy: never; uncle with colon cancer, mom with colon polyps. Has GI in [place]. *** Dentist: twice yearly Ophtho: goes yearly Exercise:  Tries to walk 15-20 minutes at work.  Hopes to get back to the gym (hard since working in [place]).  Lipid screen:   Lab Results  Component Value Date   CHOL 176 02/24/2024   HDL 68 02/24/2024   LDLCALC 100 (H) 02/24/2024   TRIG 39 02/24/2024   CHOLHDL 2.3 10/01/2023     PMH, PSH, SH and FH were reviewed and updated   ROS: The patient denies anorexia, fever, vision changes, decreased hearing, ear pain, sore throat, breast concerns, chest pain, palpitations, dizziness, syncope, dyspnea on exertion, cough, swelling, nausea, vomiting, diarrhea, constipation, abdominal pain, melena, hematochezia, indigestion/heartburn, hematuria, incontinence, dysuria, vaginal discharge, odor or itch, genital  lesions, joint pains, numbness, tingling, weakness, tremor, suspicious skin lesions, depression, anxiety, abnormal bleeding/bruising, or enlarged lymph nodes.  Migraines per HPI. Moods are good. No dysphagia  Constipation, bloating? Reflux? Abnormal vaginal bleeding?     PHYSICAL EXAM:  There were no vitals taken for this visit.  Wt Readings from Last 3 Encounters:  06/01/24 197 lb (89.4 kg)  05/16/24 198 lb (89.8 kg)  04/11/24 196 lb (88.9 kg)   General Appearance:    Alert, cooperative, no distress, appears stated age  Head:    Normocephalic, without obvious abnormality, atraumatic  Eyes:    PERRL, conjunctiva/corneas clear, EOM's intact  Ears:    Normal TM's and external ear canals  Nose:   Normal, no drainage or sinus tenderness  Throat:   Normal mucosa, no lesions  Neck:   Supple, no lymphadenopathy;  thyroid :  no enlargement/ tenderness/nodules; no JVD  Back:    No CVA tenderness.   Lungs:     Clear to auscultation bilaterally without wheezes, rales or ronchi; respirations unlabored  Chest Wall:    No tenderness or deformity   Heart:    Regular rate and rhythm, S1 and S2 normal, no murmur, rub   or gallop  Breast Exam:    Deferred to OB/GYN   Abdomen:     Soft, non-tender, nondistended, normoactive bowel sounds,    no masses, no hepatosplenomegaly  Genitalia:    Deferred to OB/GYN        Extremities:   No clubbing, cyanosis or edema  Pulses:   2+ and symmetric all extremities  Skin:   Skin color, texture, turgor normal, no rashes or lesions.   Lymph nodes:   Cervical, supraclavicular nodes normal  Neurologic:   CNII-XII intact, normal strength, sensation and gait                                 Psych:   Normal mood, affect, hygiene and grooming.              Labs done by other providers reviewed-- Thyroid  tests per HPI Chem panel in July:   Chemistry      Component Value Date/Time   NA 141 02/24/2024 0943   K 4.6 02/24/2024 0943   CL 104 02/24/2024 0943    CO2 18 (L) 02/24/2024 0943   BUN 8 02/24/2024 0943   CREATININE 0.76 02/24/2024 0943      Component Value Date/Time   CALCIUM  9.2 02/24/2024 0943   ALKPHOS 64 02/24/2024 0943   AST 22 02/24/2024 0943   ALT 17 02/24/2024 0943   BILITOT 0.8 02/24/2024 0943      Lipids in July: Lab Results  Component Value Date   CHOL 176 02/24/2024   HDL 68 02/24/2024   LDLCALC 100 (H) 02/24/2024   TRIG 39 02/24/2024   CHOLHDL 2.3 10/01/2023    Lab Results  Component Value Date   VITAMINB12 774 02/24/2024   Lab Results  Component Value Date   HGBA1C 5.3 02/24/2024     ASSESSMENT/PLAN:  COVID shot?  Cbc, unless done by her GYN (last we have is 09/2023).  GAD-7 and PHQ-9  No records from GYN since 05/2023 consult with Dr. Latisha to discuss ablation. Was due for pap in 2025.  Did she have one? If so, request records Last pap we have was from 2022--due  Is she no longer taking any statin?  Had she discussed this with neuro (lipitor was still on med list at her April visit)--did she ever try taking CoQ10 to see if it helped?  Tried any other statin?? ***CRESTOR  She was reminded last year to schedule colonoscopy with her GI in [place]--did she ever schedule?  Needs  Discussed monthly self breast exams and yearly mammograms; at least 30 minutes of aerobic activity at least 5 days/week, weight-bearing exercise at least 2x/week; proper sunscreen use reviewed; healthy diet, including goals of calcium  and vitamin D  intake and alcohol recommendations (less than or equal to 1 drink/day) reviewed; regular seatbelt use;  changing batteries in smoke detectors.  Immunization recommendations discussed--continue yearly flu shots (gets at work).  COVID booster recommended***  Colonoscopy recommendations reviewed, past due, reminded to schedule.  F/u 1 year, sooner prn.

## 2024-06-07 NOTE — Patient Instructions (Incomplete)
  HEALTH MAINTENANCE RECOMMENDATIONS:  It is recommended that you get at least 30 minutes of aerobic exercise at least 5 days/week (for weight loss, you may need as much as 60-90 minutes). This can be any activity that gets your heart rate up. This can be divided in 10-15 minute intervals if needed, but try and build up your endurance at least once a week.  Weight bearing exercise is also recommended twice weekly.  Eat a healthy diet with lots of vegetables, fruits and fiber.  Colorful foods have a lot of vitamins (ie green vegetables, tomatoes, red peppers, etc).  Limit sweet tea, regular sodas and alcoholic beverages, all of which has a lot of calories and sugar.  Up to 1 alcoholic drink daily may be beneficial for women (unless trying to lose weight, watch sugars).  Drink a lot of water .  Calcium  recommendations are 1200-1500 mg daily (1500 mg for postmenopausal women or women without ovaries), and vitamin D  1000 IU daily.  This should be obtained from diet and/or supplements (vitamins), and calcium  should not be taken all at once, but in divided doses.  Monthly self breast exams and yearly mammograms for women over the age of 81 is recommended.  Sunscreen of at least SPF 30 should be used on all sun-exposed parts of the skin when outside between the hours of 10 am and 4 pm (not just when at beach or pool, but even with exercise, golf, tennis, and yard work!)  Use a sunscreen that says broad spectrum so it covers both UVA and UVB rays, and make sure to reapply every 1-2 hours.  Remember to change the batteries in your smoke detectors when changing your clock times in the spring and fall. Carbon monoxide detectors are recommended for your home.  Use your seat belt every time you are in a car, and please drive safely and not be distracted with cell phones and texting while driving.  Please schedule colonoscopy for colon cancer screening.  We discussed using [employer]  [employer] for a branded  thyroid  medication if Dr. Sam isn't able to get the Tirosint  covered/affordable for you.    The big question for your neurologist is whether you should be on a statin due to your history of stroke.  My recommendation (if he agrees that you need one), would be to start rosuvastatin. This is often well tolerated, has a long half-life, and so we can get creative with the dosing if you have side effects. I'm not checking your cholesterol again today, because your thyroid  is over-replaced currently. Also, the recommendation has nothing to do with your cholesterol being high, just the need in general (though LDL goal would likely be <70).  We are checking your iron levels. If they are low, related to your heavy and frequent bleeding, you likely will need an iron supplement. Remember to separate that by 4 hours from the thyroid  medication.  It is best taken with vitamin C, with a protein rich meal, and away from caffeine . We will send your lab results to Dr. Latisha.

## 2024-06-07 NOTE — Telephone Encounter (Signed)
 Placed in Dr. Sam basket in her office to complete.

## 2024-06-08 ENCOUNTER — Ambulatory Visit: Payer: 59 | Admitting: Family Medicine

## 2024-06-08 ENCOUNTER — Encounter: Payer: Self-pay | Admitting: Family Medicine

## 2024-06-08 ENCOUNTER — Other Ambulatory Visit (HOSPITAL_COMMUNITY): Payer: Self-pay

## 2024-06-08 VITALS — BP 110/70 | HR 64 | Ht 65.0 in | Wt 198.0 lb

## 2024-06-08 DIAGNOSIS — E559 Vitamin D deficiency, unspecified: Secondary | ICD-10-CM | POA: Diagnosis not present

## 2024-06-08 DIAGNOSIS — Z0000 Encounter for general adult medical examination without abnormal findings: Secondary | ICD-10-CM

## 2024-06-08 DIAGNOSIS — E039 Hypothyroidism, unspecified: Secondary | ICD-10-CM

## 2024-06-08 DIAGNOSIS — Z124 Encounter for screening for malignant neoplasm of cervix: Secondary | ICD-10-CM | POA: Diagnosis not present

## 2024-06-08 DIAGNOSIS — N921 Excessive and frequent menstruation with irregular cycle: Secondary | ICD-10-CM | POA: Diagnosis not present

## 2024-06-08 DIAGNOSIS — Z1151 Encounter for screening for human papillomavirus (HPV): Secondary | ICD-10-CM | POA: Diagnosis not present

## 2024-06-08 DIAGNOSIS — Z1211 Encounter for screening for malignant neoplasm of colon: Secondary | ICD-10-CM

## 2024-06-08 DIAGNOSIS — Z6833 Body mass index (BMI) 33.0-33.9, adult: Secondary | ICD-10-CM | POA: Diagnosis not present

## 2024-06-08 DIAGNOSIS — N926 Irregular menstruation, unspecified: Secondary | ICD-10-CM | POA: Diagnosis not present

## 2024-06-08 DIAGNOSIS — F321 Major depressive disorder, single episode, moderate: Secondary | ICD-10-CM

## 2024-06-08 DIAGNOSIS — Z01419 Encounter for gynecological examination (general) (routine) without abnormal findings: Secondary | ICD-10-CM | POA: Diagnosis not present

## 2024-06-08 DIAGNOSIS — G43909 Migraine, unspecified, not intractable, without status migrainosus: Secondary | ICD-10-CM | POA: Diagnosis not present

## 2024-06-08 DIAGNOSIS — Z23 Encounter for immunization: Secondary | ICD-10-CM | POA: Diagnosis not present

## 2024-06-08 DIAGNOSIS — Z8673 Personal history of transient ischemic attack (TIA), and cerebral infarction without residual deficits: Secondary | ICD-10-CM

## 2024-06-08 MED ORDER — BUPROPION HCL ER (XL) 300 MG PO TB24
300.0000 mg | ORAL_TABLET | Freq: Every day | ORAL | 3 refills | Status: AC
  Filled 2024-06-08 – 2024-09-06 (×2): qty 90, 90d supply, fill #0

## 2024-06-09 ENCOUNTER — Other Ambulatory Visit: Payer: Self-pay | Admitting: *Deleted

## 2024-06-09 ENCOUNTER — Encounter: Payer: Self-pay | Admitting: Family Medicine

## 2024-06-09 ENCOUNTER — Ambulatory Visit: Payer: Self-pay | Admitting: Family Medicine

## 2024-06-09 DIAGNOSIS — D708 Other neutropenia: Secondary | ICD-10-CM

## 2024-06-09 LAB — CBC WITH DIFFERENTIAL/PLATELET
Basophils Absolute: 0 x10E3/uL (ref 0.0–0.2)
Basos: 0 %
EOS (ABSOLUTE): 0.1 x10E3/uL (ref 0.0–0.4)
Eos: 2 %
Hematocrit: 34.9 % (ref 34.0–46.6)
Hemoglobin: 11 g/dL — ABNORMAL LOW (ref 11.1–15.9)
Immature Grans (Abs): 0 x10E3/uL (ref 0.0–0.1)
Immature Granulocytes: 0 %
Lymphocytes Absolute: 1.3 x10E3/uL (ref 0.7–3.1)
Lymphs: 50 %
MCH: 27.4 pg (ref 26.6–33.0)
MCHC: 31.5 g/dL (ref 31.5–35.7)
MCV: 87 fL (ref 79–97)
Monocytes Absolute: 0.2 x10E3/uL (ref 0.1–0.9)
Monocytes: 9 %
Neutrophils Absolute: 1 x10E3/uL — ABNORMAL LOW (ref 1.4–7.0)
Neutrophils: 39 %
Platelets: 190 x10E3/uL (ref 150–450)
RBC: 4.01 x10E6/uL (ref 3.77–5.28)
RDW: 13 % (ref 11.7–15.4)
WBC: 2.5 x10E3/uL — CL (ref 3.4–10.8)

## 2024-06-09 LAB — IRON,TIBC AND FERRITIN PANEL
Ferritin: 15 ng/mL (ref 15–150)
Iron Saturation: 25 % (ref 15–55)
Iron: 91 ug/dL (ref 27–159)
Total Iron Binding Capacity: 371 ug/dL (ref 250–450)
UIBC: 280 ug/dL (ref 131–425)

## 2024-06-09 NOTE — Telephone Encounter (Signed)
Application has been faxed.

## 2024-06-10 ENCOUNTER — Ambulatory Visit

## 2024-06-10 VITALS — BP 103/64 | HR 73

## 2024-06-10 DIAGNOSIS — Z6832 Body mass index (BMI) 32.0-32.9, adult: Secondary | ICD-10-CM | POA: Diagnosis not present

## 2024-06-10 DIAGNOSIS — E65 Localized adiposity: Secondary | ICD-10-CM | POA: Diagnosis not present

## 2024-06-10 DIAGNOSIS — R21 Rash and other nonspecific skin eruption: Secondary | ICD-10-CM | POA: Diagnosis not present

## 2024-06-10 DIAGNOSIS — L987 Excessive and redundant skin and subcutaneous tissue: Secondary | ICD-10-CM

## 2024-06-10 DIAGNOSIS — Z8673 Personal history of transient ischemic attack (TIA), and cerebral infarction without residual deficits: Secondary | ICD-10-CM

## 2024-06-10 DIAGNOSIS — R634 Abnormal weight loss: Secondary | ICD-10-CM

## 2024-06-10 DIAGNOSIS — Z9889 Other specified postprocedural states: Secondary | ICD-10-CM

## 2024-06-10 NOTE — Progress Notes (Signed)
 Plastic & Reconstructive Surgery New Patient Visit  Patient: Donna Mccarthy MRN: [id number] Date: 06/10/24   Chief Complaint: Excess skin and fat of bilateral arms  History of Present Illness:  This is a 48 y.o. female with history of bilateral upper arm skin and fat excess she underwent bilateral brachioplasty with liposuction with Dr. Craig Shank in 2021.  Postoperatively she developed a left axillary hematoma.  She feels like the arms are symmetric in the right bigger than the left, she feels that her arms are heavy and that the excess tissue affects her daily activities. Pt complains of rashes in between the skin folds which persist despite antifungal creams and powders.  She also describes generalized discomfort denies nerve related issues.  She presents presents for consultation for revision brachioplasty. Last BMI from 06/08/2024 is 32.95 kg/m and last weight is 198 pounds. Pt had a stroke during COVID and does not smoke. Functional status is excellent.   Past Medical History: Past Medical History:  Diagnosis Date   Allergy    Anemia    Anxiety    Arthritis    arthritis knees   Blood transfusion without reported diagnosis    GERD (gastroesophageal reflux disease)    Hypothyroidism    Neuromuscular disorder (HCC)    Obesity (BMI 30-39.9) 01/05/2019   stroke 10/03/22   Vitamin D  deficiency     Past Surgical History: Past Surgical History:  Procedure Laterality Date   BIOPSY  04/03/2020   Procedure: BIOPSY;  Surgeon: Eartha Angelia Sieving, MD;  Location: [facility];  Service: Gastroenterology;;  antral, body, distal esophagus, mid esophagus   BRACHIOPLASTY Bilateral 07/02/2020   Procedure: BILATERAL BRACHIOPLASTY WITH LIPOSUCTION;  Surgeon: Shank Craig RAMAN, MD;  Location: [facility];  Service: Plastics;  Laterality: Bilateral;   COSMETIC SURGERY     excess skin removal   DILATION AND CURETTAGE OF UTERUS  2022   ESOPHAGOGASTRODUODENOSCOPY (EGD)  WITH PROPOFOL  N/A 04/03/2020   Procedure: ESOPHAGOGASTRODUODENOSCOPY (EGD) WITH PROPOFOL ;  Surgeon: Eartha Angelia Sieving, MD;  Location: [facility];  Service: Gastroenterology;  Laterality: N/A;  11:15   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   LIPOSUCTION Bilateral 07/02/2020   Procedure: LIPOSUCTION;  Surgeon: Shank Craig RAMAN, MD;  Location: [facility];  Service: Plastics;  Laterality: Bilateral;   PANNICULECTOMY N/A 08/04/2019   Procedure: PANNICULECTOMY;  Surgeon: Shank Craig RAMAN, MD;  Location: [facility];  Service: Plastics;  Laterality: N/A;  2 hours only, please   TEE WITHOUT CARDIOVERSION N/A 10/07/2022   Procedure: TRANSESOPHAGEAL ECHOCARDIOGRAM (TEE);  Surgeon: Hobart, Heather  E, MD;  Location: [facility];  Service: Cardiovascular;  Laterality: N/A;    Current Medications: Current Outpatient Medications on File Prior to Visit  Medication Sig Dispense Refill   buPROPion  (WELLBUTRIN  XL) 300 MG 24 hr tablet Take 1 tablet (300 mg total) by mouth daily. 90 tablet 3   butalbital -acetaminophen -caffeine  (FIORICET ) 50-325-40 MG tablet Take 1 tablet by mouth daily as needed for headache. 10 tablet 1   Ferric Maltol  (ACCRUFER ) 30 MG CAPS Take 1 capsule (30 mg total) by mouth daily. (Patient not taking: Reported on 06/08/2024) 30 capsule 1   fluticasone  (FLONASE ) 50 MCG/ACT nasal spray Place 2 sprays into both nostrils as needed for allergies.     levonorgestrel  (MIRENA , 52 MG,) 20 MCG/DAY IUD Take by intrauterine route.     levothyroxine  (SYNTHROID ) 300 MCG tablet Take 1 tablet (300 mcg total) by mouth as directed. 1 tablet  Monday through Saturday and none on Sundays     Levothyroxine  Sodium (TIROSINT ) 200 MCG CAPS Take 200 mcg by mouth daily in the afternoon. 30 capsule 6   loratadine (CLARITIN) 10 MG tablet Take 10 mg by mouth daily as needed for allergies.     Magnesium 250 MG TABS Take 250 mg by mouth every other day.     pantoprazole   (PROTONIX ) 40 MG tablet Take 1 tablet (40 mg total) by mouth at bedtime. 30 tablet 0   Rimegepant Sulfate  (NURTEC) 75 MG TBDP Take 1 tablet (75 mg total) by mouth every other day. 16 tablet 11   topiramate  (TOPAMAX ) 25 MG tablet Take 2 tablets (50 mg total) by mouth daily. 60 tablet 1   Vitamin D , Ergocalciferol , (DRISDOL ) 1.25 MG (50000 UNIT) CAPS capsule Take 1 capsule (50,000 Units total) by mouth every 7 (seven) days. 4 capsule 1   No current facility-administered medications on file prior to visit.    Allergies: Allergies  Allergen Reactions   Ketorolac Tromethamine Other (See Comments)    felt like whole body was on fire   Penicillins Other (See Comments)    Mother said she had a rash   Celebrex  [Celecoxib ] Itching    itching   Compazine  [Prochlorperazine ] Other (See Comments)    Out of body experience   Egg Protein-Containing Drug Products Other (See Comments)    Family History:  Family history is negative for bleeding/clotting disorders, problems with anesthesia, or connective tissue disorders.   Social History:  Social History   Socioeconomic History   Marital status: Single    Spouse name: Not on file   Number of children: 1   Years of education: 14   Highest education level: Associate degree: occupational, Scientist, product/process development, or vocational program  Occupational History   Occupation: cma    Employer: [employer]  Tobacco Use   Smoking status: Never   Smokeless tobacco: Never  Vaping Use   Vaping status: Never Used  Substance and Sexual Activity   Alcohol use: No   Drug use: No   Sexual activity: Not Currently    Partners: Male    Birth control/protection: I.U.D.  Other Topics Concern   Not on file  Social History Narrative   Lives alone.   Son Ole lives in [place], has a son Arnaldo.   Works in Presenter, broadcasting for [employer] and in the health administration cohort school through [employer]      Updated 05/2024   Social Drivers of Health   Financial Resource  Strain: Low Risk  (06/08/2024)   Overall Financial Resource Strain (CARDIA)    Difficulty of Paying Living Expenses: Not hard at all  Food Insecurity: No Food Insecurity (06/08/2024)   Hunger Vital Sign    Worried About Running Out of Food in the Last Year: Never true    Ran Out of Food in the Last Year: Never true  Transportation Needs: No Transportation Needs (06/08/2024)   PRAPARE - Administrator, Civil Service (Medical): No    Lack of Transportation (Non-Medical): No  Physical Activity: Insufficiently Active (06/08/2024)   Exercise Vital Sign    Days of Exercise per Week: 3 days    Minutes of Exercise per Session: 20 min  Stress: No Stress Concern Present (06/08/2024)   [employer] of Occupational Health - Occupational Stress Questionnaire    Feeling of Stress: Not at all  Social Connections: Moderately Integrated (06/08/2024)   Social Connection and Isolation Panel  Frequency of Communication with Friends and Family: Three times a week    Frequency of Social Gatherings with Friends and Family: Twice a week    Attends Religious Services: More than 4 times per year    Active Member of [employer] or Organizations: Yes    Attends Banker Meetings: 1 to 4 times per year    Marital Status: Divorced   Review of systems: 10 point review of systems performed and negative except as noted in the HPI  Physical Exam: BP 103/64   Pulse 73   SpO2 99%  There is no height or weight on file to calculate BMI.  Physical Exam           General: Well appearing, no apparent distress. Pulm: Breathing comfortably on room air without sounds/wheezing. CV: Good perfusion of extremities. 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  Moderate to severe amount of subcutaneous fat present.  Severe amount of skin laxity. Skin quality is poor. No open wounds or scars. Sensation intact to light tough in both MBC and MABC nerve distributions. Sensation intact to  light touch in radial, ulnar and median nerve distributions. Patient is able to give thumbs up, cross fingers and make ok' sign bilaterally.  Scar pattern can be appreciated on today's pictures, it extends posteriorly into the posterior axillary line.  As symmetric when right is compared to the left. Neuro: Moving all four extremities spontaneously. Psych: Appropriate mood and affect.  Labs: Recent Results (from the past 2160 hours)  POC Covid19/Flu A&B Antigen     Status: None   Collection Time: 03/21/24  4:41 PM  Result Value Ref Range   Influenza A Antigen, POC Negative Negative   Influenza B Antigen, POC Negative Negative   Covid Antigen, POC Negative Negative  Rapid Strep A     Status: None   Collection Time: 03/21/24  4:42 PM  Result Value Ref Range   Rapid Strep A Screen Negative Negative  TSH     Status: Abnormal   Collection Time: 06/01/24  9:20 AM  Result Value Ref Range   TSH 0.13 (L) mIU/L    Comment:           Reference Range .           > or = 20 Years  0.40-4.50 .                Pregnancy Ranges           First trimester    0.26-2.66           Second trimester   0.55-2.73           Third trimester    0.43-2.91   T4, free     Status: Abnormal   Collection Time: 06/01/24  9:20 AM  Result Value Ref Range   Free T4 2.9 (H) 0.8 - 1.8 ng/dL  Thyroid  peroxidase antibody     Status: Abnormal   Collection Time: 06/01/24  9:20 AM  Result Value Ref Range   Thyroperoxidase Ab SerPl-aCnc 494 (H) <9 IU/mL  CBC with Differential     Status: Abnormal   Collection Time: 06/08/24 10:39 AM  Result Value Ref Range   WBC 2.5 (LL) 3.4 - 10.8 x10E3/uL   RBC 4.01 3.77 - 5.28 x10E6/uL   Hemoglobin 11.0 (L) 11.1 - 15.9 g/dL   Hematocrit 65.0 65.9 - 46.6 %   MCV 87 79 - 97 fL   MCH 27.4 26.6 -  33.0 pg   MCHC 31.5 31.5 - 35.7 g/dL   RDW 86.9 88.2 - 84.5 %   Platelets 190 150 - 450 x10E3/uL   Neutrophils 39 Not Estab. %   Lymphs 50 Not Estab. %   Monocytes 9 Not Estab. %   Eos  2 Not Estab. %   Basos 0 Not Estab. %   Neutrophils Absolute 1.0 (L) 1.4 - 7.0 x10E3/uL   Lymphocytes Absolute 1.3 0.7 - 3.1 x10E3/uL   Monocytes Absolute 0.2 0.1 - 0.9 x10E3/uL   EOS (ABSOLUTE) 0.1 0.0 - 0.4 x10E3/uL   Basophils Absolute 0.0 0.0 - 0.2 x10E3/uL   Immature Granulocytes 0 Not Estab. %   Immature Grans (Abs) 0.0 0.0 - 0.1 x10E3/uL  Iron, TIBC and Ferritin Panel     Status: None   Collection Time: 06/08/24 10:39 AM  Result Value Ref Range   Total Iron Binding Capacity 371 250 - 450 ug/dL   UIBC 719 868 - 574 ug/dL   Iron 91 27 - 840 ug/dL   Iron Saturation 25 15 - 55 %   Ferritin 15 15 - 150 ng/mL    Pertinent Imaging:  None  Assessment: This is a 48 y.o. female with history of massive weight loss and history of previous brachioplasty in 2021 with Dr. Elisabeth.  Patient developed a hematoma on the left axilla postoperatively.  Presents with a symmetry of bilateral arms, excess fat and skin that are symptomatic.  Patient would like a revision brachioplasty.  The patient has maintained a stable weight loss for over 12 months. Based on the patient's exam and discussion of patient's goals today, I believe the patient would be an appropriate candidate for revision brachioplasty with liposuction.   We discussed the patient's goals and priorities at length. We reviewed that body contouring operations trade better contours at the expense of long, sometimes wide scars. We further discussed the operation(s) in detail including the incision and scar patterns, need for surgical drains and importance of postoperative compression. We reviewed that massive weight loss patients are at a higher risk for recurrent laxity and wound healing problems including dehiscence and infection. We further discussed the risks of bleeding, seroma, hematoma, asymmetries, standing cutaneous deformities or other contour deformities, asymmetry, skin/nipple/umbilical necrosis, fat necrosis/oil cysts, lymphedema,  chronic wounds or sinus tracts, hypertrophic and keloid scarring, nerve injury (ie. LFCN, iliohypogastric, ilioinguinal nerves), numbness, paresthesia and sensory changes, intraabdominal injury. Revision procedures are not uncommon. We also discussed the risk of DVT/PE, fat embolism, heart attack, stroke, death as well as the risks of anesthesia. We reviewed the expected recovery period with no heavy activities or lifting >5lbs for 6 weeks postoperatively.   The patient's BMI today is 32.95. We discussed that patients with BMI>30 are more likely to suffer both minor and major complications following these procedures. The patient verbalized understanding of this concept and is willing to accept these increased risks.   The patient voiced understanding and wishes to proceed. All questions and concerns were addressed to the patient's apparent satisfaction.  Plan - We will plan for revision brachioplasty with liposuction (avulsion technique) - Photographic consent obtained today.  - 4 hours under general anesthesia. - Will submit for pre-determination with insurance. - Scheduling pending insurance.  - Patient will need PCP clearance due to history of stroke.  The sensitive parts of the examination/procedure were performed with MA as chaperone.  The time documented represents the total time spent on the day of the encounter in preparing for  and completing the visit. It does not include time spent by ancillary staff, a resident, a fellow, another trainee, or, for shared visits, time spent jointly with the patient or discussing the case or the performance of other separately performed services.  Time spent: 45 minutes.     Kamya Watling, MD [facility]  06/10/2024 4:42 PM

## 2024-06-13 ENCOUNTER — Other Ambulatory Visit

## 2024-06-13 ENCOUNTER — Ambulatory Visit: Admitting: Neurology

## 2024-06-13 ENCOUNTER — Encounter: Payer: Self-pay | Admitting: Neurology

## 2024-06-13 ENCOUNTER — Other Ambulatory Visit: Payer: Self-pay

## 2024-06-13 ENCOUNTER — Ambulatory Visit (INDEPENDENT_AMBULATORY_CARE_PROVIDER_SITE_OTHER): Admitting: Physician Assistant

## 2024-06-13 ENCOUNTER — Telehealth: Payer: Self-pay | Admitting: *Deleted

## 2024-06-13 VITALS — BP 98/64 | HR 81 | Temp 98.0°F | Ht 65.0 in | Wt 191.0 lb

## 2024-06-13 VITALS — BP 113/77 | HR 79 | Ht 65.0 in | Wt 195.5 lb

## 2024-06-13 DIAGNOSIS — D708 Other neutropenia: Secondary | ICD-10-CM | POA: Diagnosis not present

## 2024-06-13 DIAGNOSIS — G43909 Migraine, unspecified, not intractable, without status migrainosus: Secondary | ICD-10-CM

## 2024-06-13 DIAGNOSIS — I639 Cerebral infarction, unspecified: Secondary | ICD-10-CM

## 2024-06-13 DIAGNOSIS — E559 Vitamin D deficiency, unspecified: Secondary | ICD-10-CM

## 2024-06-13 DIAGNOSIS — E66811 Obesity, class 1: Secondary | ICD-10-CM

## 2024-06-13 DIAGNOSIS — Z6831 Body mass index (BMI) 31.0-31.9, adult: Secondary | ICD-10-CM | POA: Diagnosis not present

## 2024-06-13 DIAGNOSIS — E039 Hypothyroidism, unspecified: Secondary | ICD-10-CM | POA: Diagnosis not present

## 2024-06-13 DIAGNOSIS — H4921 Sixth [abducent] nerve palsy, right eye: Secondary | ICD-10-CM | POA: Diagnosis not present

## 2024-06-13 DIAGNOSIS — E063 Autoimmune thyroiditis: Secondary | ICD-10-CM | POA: Insufficient documentation

## 2024-06-13 DIAGNOSIS — E88819 Insulin resistance, unspecified: Secondary | ICD-10-CM | POA: Diagnosis not present

## 2024-06-13 DIAGNOSIS — E669 Obesity, unspecified: Secondary | ICD-10-CM

## 2024-06-13 DIAGNOSIS — D72819 Decreased white blood cell count, unspecified: Secondary | ICD-10-CM

## 2024-06-13 DIAGNOSIS — Z6830 Body mass index (BMI) 30.0-30.9, adult: Secondary | ICD-10-CM

## 2024-06-13 LAB — CBC WITH DIFFERENTIAL/PLATELET
Basophils Absolute: 0 x10E3/uL (ref 0.0–0.2)
Basos: 1 %
EOS (ABSOLUTE): 0 x10E3/uL (ref 0.0–0.4)
Eos: 1 %
Hematocrit: 37.9 % (ref 34.0–46.6)
Hemoglobin: 11.7 g/dL (ref 11.1–15.9)
Immature Grans (Abs): 0 x10E3/uL (ref 0.0–0.1)
Immature Granulocytes: 0 %
Lymphocytes Absolute: 1.8 x10E3/uL (ref 0.7–3.1)
Lymphs: 47 %
MCH: 27 pg (ref 26.6–33.0)
MCHC: 30.9 g/dL — ABNORMAL LOW (ref 31.5–35.7)
MCV: 88 fL (ref 79–97)
Monocytes Absolute: 0.4 x10E3/uL (ref 0.1–0.9)
Monocytes: 10 %
Neutrophils Absolute: 1.5 x10E3/uL (ref 1.4–7.0)
Neutrophils: 41 %
Platelets: 211 x10E3/uL (ref 150–450)
RBC: 4.33 x10E6/uL (ref 3.77–5.28)
RDW: 13.1 % (ref 11.7–15.4)
WBC: 3.8 x10E3/uL (ref 3.4–10.8)

## 2024-06-13 MED ORDER — BUTALBITAL-APAP-CAFFEINE 50-325-40 MG PO TABS
1.0000 | ORAL_TABLET | Freq: Every day | ORAL | 1 refills | Status: AC | PRN
  Filled 2024-06-13: qty 10, 10d supply, fill #0

## 2024-06-13 MED ORDER — VITAMIN D (ERGOCALCIFEROL) 1.25 MG (50000 UNIT) PO CAPS
50000.0000 [IU] | ORAL_CAPSULE | ORAL | 1 refills | Status: DC
  Filled 2024-06-13: qty 4, 28d supply, fill #0
  Filled 2024-07-25: qty 4, 28d supply, fill #1

## 2024-06-13 MED ORDER — ROSUVASTATIN CALCIUM 5 MG PO TABS
5.0000 mg | ORAL_TABLET | Freq: Every day | ORAL | 3 refills | Status: AC
Start: 2024-06-13 — End: ?
  Filled 2024-06-13: qty 90, 90d supply, fill #0

## 2024-06-13 NOTE — Progress Notes (Signed)
 [facility]  PATIENT: Donna Mccarthy DOB: 02-Jun-1976  REQUESTING CLINICIAN: Randol Dawes, MD HISTORY FROM: Patient  REASON FOR VISIT: MRI Negative stroke    HISTORICAL  CHIEF COMPLAINT:  Chief Complaint  Patient presents with   RM14/MIGRAINES    Pt is here Alone. Pt states that she feels like her migraines are allergy related.    INTERVAL HISTORY 06/13/2024 Patient presents today for follow-up, last visit was in April.  At that time she was complaining of word finding difficulty while on topiramate , topiramate  discontinued and patient started on propranolol  and Nurtec.  She tells me since then she was diagnosed with Hashimoto thyroiditis.  She was told that her symptoms of word-finding difficulty, confusion was due to her thyroid  disease and not a side effect of topiramate .  She has been undergoing weight management and topiramate  has been reintroduced but at a lower dose, 25 mg twice daily.  She denies any worsening cognitive difficulty.  She continues on Nurtec every other day.  Tells me on average she will have 2-3 migraine a month and Fioricet  and Excedrin do help.  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  She tells me this is an improvement from her initial presentation during the stroke but has not fully recovered.  She did complete therapy.   INTERVAL HISTORY 11/19/2023:  Patient presents today for follow-up, last visit was a year ago since then she has been doing well, denies any stroke or strokelike symptoms.  She tells me that lately she has been experiencing right leg pain, described as a stabbing pain.  Feels like it might be related to her back.   In terms of the headaches, she still having headaches, the frequency of the headaches increased. She is on Topiramate  as preventive but does complaints of word finding difficulty.  She tells me that she  continue to take Maxalt occasionally.    HISTORY OF PRESENT ILLNESS:  This is a 48 year old woman with multiple medical conditions including migraine headaches, anxiety, vitamin D  deficiency, who was admitted on 2/16 for left facial droop, blurry vision and slurred speech.  She was also found to have dysarthria.  Due to ongoing symptoms she was given TKN.  Her workup has been negative for any LVO or any stroke on MRI brain due to persistent of symptoms, she was deemed to have MRI negative stroke. She was admitted to acute rehab.  She completed rehab with resolution of her symptoms. She completed DAPT and now she is on Aspirin  alone, denies side effects. Since discharge from the hospital she has been doing well.  She reports occasional headache for which she takes Fioricet  with some improvement.  She was told to stop the Imitrex .  She is wondering if she can go back to driving.   Hospital Course and Summary  Donna Mccarthy is a 48 y.o. female with history of migraines, anxiety, vitamin D  deficiency, COVID 3 weeks prior to admission on 10/03/2022 with left facial droop, blurred vision and slurred speech.  She was found to have severe dysarthria with left facial droop that was variable and bilateral CN VII incomplete palsy.  CTA without LVO and she received TNK.  MRI/MRA brain done and was negative for stroke, stenosis or acute process.  She did have disconjugate gaze with diplopia that Dr. 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  Neurology recommended DAPT x 3 weeks followed by aspirin  alone.  Therapy was working with patient who continued to be limited by double vision, right-sided weakness with decrease in motor control as well as sensory deficits.  CIR was recommended due to functional decline.   She was admitted to rehab 10/08/2022 for inpatient therapies to consist of PT, ST and OT at least three hours five days a week. Past  admission physiatrist, therapy team and rehab RN have worked together to provide customized collaborative inpatient rehab.  She was maintained on DAPT during her stay and is tolerating this without any side effects.  Follow-up CBC did show slight drop in H&H without any signs of bleeding.  She completes her DAPT close in 48 hours and needs to stop Plavix  and will continue on aspirin  only.  Recommend repeat check CBC in 1 to 2 weeks to monitor for H&H stability.   Her mood has been stable on home dose Wellbutrin .  She she reported pain right calf therefore BLE Dopplers done and were negative for DVT.  PT has worked with patient on stretches and Voltaren  gel and K-pad will use with resolution of symptoms. Her blood pressures were monitored on TID basis and have been stable.  Transient hypokalemia has resolved with brief supplementation.  Headaches are currently managed with use of Topamax  alone.  Ergocalciferol  was resumed due to history of low vitamin D  levels.  Made good gains during his stay and is modified independent at discharge.  She will continue to receive outpatient PT, OT and ST at [facility] neuro rehab at [facility] after discharge.  OTHER MEDICAL CONDITIONS: Migraines, anxiety, vitamin D  deficiency   REVIEW OF SYSTEMS: Full 14 system review of systems performed and negative with exception of: As noted in the HPI   ALLERGIES: Allergies  Allergen Reactions   Ketorolac Tromethamine Other (See Comments)    felt like whole body was on fire   Penicillins Other (See Comments)    Mother said she had a rash   Celebrex  [Celecoxib ] Itching    itching   Compazine  [Prochlorperazine ] Other (See Comments)    Out of body experience   Egg Protein-Containing Drug Products Other (See Comments)    HOME MEDICATIONS: Outpatient Medications Prior to Visit  Medication Sig Dispense Refill   buPROPion  (WELLBUTRIN  XL) 300 MG 24 hr tablet Take 1 tablet (300 mg total) by mouth daily. 90 tablet 3   Ferric  Maltol (ACCRUFER ) 30 MG CAPS Take 1 capsule (30 mg total) by mouth daily. 30 capsule 1   fluticasone  (FLONASE ) 50 MCG/ACT nasal spray Place 2 sprays into both nostrils as needed for allergies.     levonorgestrel  (MIRENA , 52 MG,) 20 MCG/DAY IUD Take by intrauterine route.     levothyroxine  (SYNTHROID ) 300 MCG tablet Take 1 tablet (300 mcg total) by mouth as directed. 1 tablet Monday through Saturday and none on Sundays     loratadine (CLARITIN) 10 MG tablet Take 10 mg by mouth daily as needed for allergies.     Magnesium 250 MG TABS Take 250 mg by mouth every other day.     pantoprazole  (PROTONIX ) 40 MG tablet Take 1 tablet (40 mg total) by mouth at bedtime. 30 tablet 0   Rimegepant Sulfate  (NURTEC) 75 MG TBDP Take 1 tablet (75 mg total) by mouth every other day. 16 tablet 11   topiramate  (TOPAMAX ) 25 MG tablet Take 2 tablets (50 mg total) by  mouth daily. 60 tablet 1   Vitamin D , Ergocalciferol , (DRISDOL ) 1.25 MG (50000 UNIT) CAPS capsule Take 1 capsule (50,000 Units total) by mouth every 7 (seven) days. 4 capsule 1   butalbital -acetaminophen -caffeine  (FIORICET ) 50-325-40 MG tablet Take 1 tablet by mouth daily as needed for headache. 10 tablet 1   No facility-administered medications prior to visit.    PAST MEDICAL HISTORY: Past Medical History:  Diagnosis Date   Allergy    Anemia    Anxiety    Arthritis    arthritis knees   Blood transfusion without reported diagnosis    GERD (gastroesophageal reflux disease)    Hashimoto's disease 2025   Hypothyroidism    Neuromuscular disorder (HCC)    Obesity (BMI 30-39.9) 01/05/2019   stroke 10/03/22   Vitamin D  deficiency     PAST SURGICAL HISTORY: Past Surgical History:  Procedure Laterality Date   BIOPSY  04/03/2020   Procedure: BIOPSY;  Surgeon: Eartha Angelia Sieving, MD;  Location: [facility];  Service: Gastroenterology;;  antral, body, distal esophagus, mid esophagus   BRACHIOPLASTY Bilateral 07/02/2020   Procedure: BILATERAL  BRACHIOPLASTY WITH LIPOSUCTION;  Surgeon: Elisabeth Craig RAMAN, MD;  Location: [facility];  Service: Plastics;  Laterality: Bilateral;   COSMETIC SURGERY     excess skin removal   DILATION AND CURETTAGE OF UTERUS  2022   ESOPHAGOGASTRODUODENOSCOPY (EGD) WITH PROPOFOL  N/A 04/03/2020   Procedure: ESOPHAGOGASTRODUODENOSCOPY (EGD) WITH PROPOFOL ;  Surgeon: Eartha Angelia Sieving, MD;  Location: [facility];  Service: Gastroenterology;  Laterality: N/A;  11:15   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   LIPOSUCTION Bilateral 07/02/2020   Procedure: LIPOSUCTION;  Surgeon: Elisabeth Craig RAMAN, MD;  Location: [facility];  Service: Plastics;  Laterality: Bilateral;   PANNICULECTOMY N/A 08/04/2019   Procedure: PANNICULECTOMY;  Surgeon: Elisabeth Craig RAMAN, MD;  Location: [facility];  Service: Plastics;  Laterality: N/A;  2 hours only, please   TEE WITHOUT CARDIOVERSION N/A 10/07/2022   Procedure: TRANSESOPHAGEAL ECHOCARDIOGRAM (TEE);  Surgeon: Hobart, Heather  E, MD;  Location: [facility];  Service: Cardiovascular;  Laterality: N/A;    FAMILY HISTORY: Family History  Problem Relation Age of Onset   Hypertension Mother    Crohn's disease Mother    Colon polyps Mother    Hypertension Father        POTS   Stroke Father        TIA   Hyperlipidemia Father    Congestive Heart Failure Father 39   Arthritis Father    Hypertension Brother    Breast cancer Maternal Grandmother    Diabetes Maternal Grandmother    Heart failure Maternal Grandmother    Thyroid  disease Paternal Grandmother    Vitamin D  deficiency Paternal Grandmother    Heart failure Paternal Grandfather    Heart attack Paternal Grandfather    Heart disease Paternal Grandfather    Healthy Son    Breast cancer Maternal Aunt    Colon cancer Maternal Uncle        dx'd in 25's   Colon cancer Maternal Uncle 43   Healthy Grandson     SOCIAL HISTORY: Social History   Socioeconomic  History   Marital status: Single    Spouse name: Not on file   Number of children: 1   Years of education: 14   Highest education level: Associate degree: occupational, scientist, product/process development, or vocational program  Occupational History   Occupation: cma    Employer: [employer]  Tobacco  Use   Smoking status: Never   Smokeless tobacco: Never  Vaping Use   Vaping status: Never Used  Substance and Sexual Activity   Alcohol use: No   Drug use: No   Sexual activity: Not Currently    Partners: Male    Birth control/protection: I.U.D.  Other Topics Concern   Not on file  Social History Narrative   Lives alone.   Son Ole lives in [place], has a son Arnaldo.   Works in presenter, broadcasting for [employer] and in the health administration cohort school through [employer]      Updated 05/2024   Social Drivers of Health   Financial Resource Strain: Low Risk  (06/08/2024)   Overall Financial Resource Strain (CARDIA)    Difficulty of Paying Living Expenses: Not hard at all  Food Insecurity: No Food Insecurity (06/08/2024)   Hunger Vital Sign    Worried About Running Out of Food in the Last Year: Never true    Ran Out of Food in the Last Year: Never true  Transportation Needs: No Transportation Needs (06/08/2024)   PRAPARE - Administrator, Civil Service (Medical): No    Lack of Transportation (Non-Medical): No  Physical Activity: Insufficiently Active (06/08/2024)   Exercise Vital Sign    Days of Exercise per Week: 3 days    Minutes of Exercise per Session: 20 min  Stress: No Stress Concern Present (06/08/2024)   [employer] of Occupational Health - Occupational Stress Questionnaire    Feeling of Stress: Not at all  Social Connections: Moderately Integrated (06/08/2024)   Social Connection and Isolation Panel    Frequency of Communication with Friends and Family: Three times a week    Frequency of Social Gatherings with Friends and Family: Twice a week    Attends Religious  Services: More than 4 times per year    Active Member of [employer] or Organizations: Yes    Attends Banker Meetings: 1 to 4 times per year    Marital Status: Divorced  Intimate Partner Violence: Not At Risk (06/08/2024)   Humiliation, Afraid, Rape, and Kick questionnaire    Fear of Current or Ex-Partner: No    Emotionally Abused: No    Physically Abused: No    Sexually Abused: No    PHYSICAL EXAM  GENERAL EXAM/CONSTITUTIONAL: Vitals:  Vitals:   06/13/24 0951  BP: 113/77  Pulse: 79  SpO2: 99%  Weight: 195 lb 8 oz (88.7 kg)  Height: 5' 5 (1.651 m)   Body mass index is 32.53 kg/m. Wt Readings from Last 3 Encounters:  06/13/24 195 lb 8 oz (88.7 kg)  06/08/24 198 lb (89.8 kg)  06/01/24 197 lb (89.4 kg)   Patient is in no distress; well developed, nourished and groomed; neck is supple  MUSCULOSKELETAL: Gait, strength, tone, movements noted in Neurologic exam below  NEUROLOGIC: MENTAL STATUS:      No data to display         awake, alert, oriented to person, place and time recent and remote memory intact normal attention and concentration language fluent, comprehension intact, naming intact fund of knowledge appropriate  CRANIAL NERVE:  2nd, 3rd, 4th, 6th - Visual fields full to confrontation, extraocular muscles intact, no nystagmus 5th - facial sensation symmetric 7th - facial strength symmetric 8th - hearing intact 9th - palate elevates symmetrically, uvula midline 11th - shoulder shrug symmetric 12th - tongue protrusion midline  MOTOR:  normal bulk and tone, full strength in the BUE,  BLE  SENSORY:  normal and symmetric to light touch  COORDINATION:  finger-nose-finger, fine finger movements normal  GAIT/STATION:  normal   DIAGNOSTIC DATA (LABS, IMAGING, TESTING) - I reviewed patient records, labs, notes, testing and imaging myself where available.  Lab Results  Component Value Date   WBC 2.5 (LL) 06/08/2024   HGB 11.0 (L) 06/08/2024    HCT 34.9 06/08/2024   MCV 87 06/08/2024   PLT 190 06/08/2024      Component Value Date/Time   NA 141 02/24/2024 0943   K 4.6 02/24/2024 0943   CL 104 02/24/2024 0943   CO2 18 (L) 02/24/2024 0943   GLUCOSE 82 02/24/2024 0943   GLUCOSE 88 01/14/2023 1119   BUN 8 02/24/2024 0943   CREATININE 0.76 02/24/2024 0943   CALCIUM  9.2 02/24/2024 0943   PROT 6.9 02/24/2024 0943   ALBUMIN 4.3 02/24/2024 0943   AST 22 02/24/2024 0943   ALT 17 02/24/2024 0943   ALKPHOS 64 02/24/2024 0943   BILITOT 0.8 02/24/2024 0943   GFRNONAA >60 01/14/2023 1110   GFRAA 129 04/27/2020 1503   Lab Results  Component Value Date   CHOL 176 02/24/2024   HDL 68 02/24/2024   LDLCALC 100 (H) 02/24/2024   TRIG 39 02/24/2024   CHOLHDL 2.3 10/01/2023   Lab Results  Component Value Date   HGBA1C 5.3 02/24/2024   Lab Results  Component Value Date   VITAMINB12 774 02/24/2024   Lab Results  Component Value Date   TSH 0.13 (L) 06/01/2024   MRI/MRA  1. No acute intracranial process. No evidence of acute or subacute infarct. 2. No intracranial large vessel occlusion or significant stenosis. Previously seen narrowing of the MCAs and PCAs on the CTA is not apparent on this exam and may have been secondary to poor vascular opacification  Echo 10/07/22 1. Left ventricular ejection fraction, by estimation, is 60 to 65%. The left ventricle has normal function.  2. Right ventricular systolic function is normal. The right ventricular size is normal.  3. No left atrial/left atrial appendage thrombus was detected.  4. The mitral valve is normal in structure. Trivial mitral valve regurgitation.  5. The aortic valve is tricuspid. Aortic valve regurgitation is not visualized. No aortic stenosis is present.  6. The atrial septum is redundant. No interatrial shunting detected by color doppler. Multiple agitated saline contrast bubble studies performed. The majority of bubbles were seen late suggestive of intrapulmonary   shunting. There were a small number of bubbles seen early during the study suggestive of a small PFO. Recommend transcranial doppler for further evaluation.    ASSESSMENT AND PLAN  48 y.o. year old female with medical conditions including high anxiety, migraine headaches, vitamin D  deficiency, thyroid  disease, who is presenting for follow-up for her MRI negative stroke and headaches.  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  Plan for patient is to continue with her eye movement exercises, she can also consider prism  glasses with her ophthalmologist.  In terms of the headaches, they are improving with the combination of topiramate  and Nurtec.  Plan will be for patient to continue with topiramate  25 mg twice daily, can consider increasing it in the future for weight management perspective as her side effects were deemed to be secondary to thyroid  disease.  She will continue with Nurtec and use Excedrin and Fioricet  as needed.  I will see her in 6  months for follow-up or sooner if worse.     1. Episodic migraine   2. Cerebrovascular accident (CVA), unspecified mechanism (HCC)   3. Right abducens nerve palsy     Patient Instructions  Continue current medications Continue with ibuprofen  exercise Will start patient on Crestor 5 mg nightly Consider prism  glasses with ophthalmologist if needed Continue with Nurtec every other day for migraines Continue with Excedrin and Fioricet  as needed for migraines Continue follow-up PCP Return in 6 months or sooner if worse   No orders of the defined types were placed in this encounter.   Meds ordered this encounter  Medications   rosuvastatin (CRESTOR) 5 MG tablet    Sig: Take 1 tablet (5 mg total) by mouth daily.    Dispense:  90 tablet    Refill:  3   butalbital -acetaminophen -caffeine  (FIORICET ) 50-325-40 MG tablet    Sig: Take 1 tablet by mouth daily as needed for  headache.    Dispense:  10 tablet    Refill:  1    Must last the entire 30 days    Return in about 6 months (around 12/12/2024).   Pastor Falling, MD 06/13/2024, 10:56 AM  [facility] [address], [address] [place], [place] [address] [phone]

## 2024-06-13 NOTE — Patient Instructions (Signed)
 Continue current medications Continue with ibuprofen  exercise Will start patient on Crestor 5 mg nightly Consider prism  glasses with ophthalmologist if needed Continue with Nurtec every other day for migraines Continue with Excedrin and Fioricet  as needed for migraines Continue follow-up PCP Return in 6 months or sooner if worse

## 2024-06-13 NOTE — Telephone Encounter (Signed)
 Patient was here for labs this am. She wanted you to know that Dr Gregg refilled crestor x 1 year and then after that you can take over.

## 2024-06-13 NOTE — Progress Notes (Signed)
 SUBJECTIVE: Discussed the use of AI scribe software for clinical note transcription with the patient, who gave verbal consent to proceed.  Chief Complaint: Obesity  Interim History: She is down 7 lbs since her last visit She is back to her baseline weight.   Donna Mccarthy is here to discuss her progress with her obesity treatment plan. She is on the Gluten Free and Vegan planand states she is following her eating plan approximately 80 % of the time. She states she is walking 20 minutes 3-4 times per week.  Donna Mccarthy is a 48 year old female with obesity and hypothyroidism who presents for follow-up of her obesity treatment plan.  She has a history of hypothyroidism, specifically Hashimoto's disease, which is now under better control. She is currently on a gluten-free, vegetarian/vegan diet, which she follows approximately 80% of the time. She has lost seven pounds since her last visit, with a current weight of 191 pounds and a BMI of 31.9. She is consuming whole foods, adequate protein, and sufficient water .  Her sleep remains inconsistent, as she is trying to achieve six to eight hours of sleep per night. She has implemented a cut-off time for homework to improve her sleep schedule. Her sleep is slightly better once she falls asleep. She is studying to become an [employer] but also working full time.   She is currently taking Topamax  for migraine prophylaxis as well as for cravings and is comfortable with the current dose. She is also on levothyroxine  for her hypothyroidism and has seen Dr. Sam who is evaluating her thyroid  function further with concerns for Hashimoto's disease as we suspected. She engages in physical activity, walking 20 minutes three to four days a week, often with a colleague. She is also making lifestyle adjustments, such as parking further away to increase her daily steps.  Her white blood cell count was rechecked due to previous low levels and hopefully results will  return soon. She is also taking vitamin D  once a week, with a previous level of 31.8 in July. OBJECTIVE: Visit Diagnoses: Problem List Items Addressed This Visit     Hypothyroidism   Migraine   Vitamin D  deficiency   Relevant Medications   Vitamin D , Ergocalciferol , (DRISDOL ) 1.25 MG (50000 UNIT) CAPS capsule   Other Visit Diagnoses       Insulin  resistance    -  Primary     Leukopenia, unspecified type         Obesity starting BMI 31.78 date/10/01/23         BMI 30.0-30.9,adult Current BMI 31.9         Obesity, class 1 Obesity, class 1 with a BMI of 31.9. She has lost 7 pounds since the last visit, with a reduction in both muscle and adipose tissue. Visceral adipose rating improved to 9, surpassing the goal of 10. She follows a gluten-free, vegetarian/vegan diet 80% of the time, consuming adequate protein, whole foods, and water . Engages in physical activity by walking 20 minutes, 3-4 days a week. - Continue current dietary plan with focus on whole foods and adequate protein intake. - Encourage continued physical activity, including walking 20 minutes, 3-4 days a week. - Discuss strategies for maintaining weight loss during upcoming holidays.   Insulin  Resistance Last fasting insulin  was 5.8- nearing goal. A1c was 5.3- at goal. Polyphagia:Yes- at times Medication(s): Topiramate  50 mg daily Reports no side effects, no tingling, no word finding problems.  Lab Results  Component Value Date   HGBA1C  5.3 02/24/2024   HGBA1C 5.3 10/01/2023   HGBA1C 5.0 10/04/2022   HGBA1C 5.4 04/20/2019   Lab Results  Component Value Date   INSULIN  5.8 02/24/2024   INSULIN  6.3 10/01/2023    Plan: Continue Topiramate  50 mg daily Continue working on nutrition plan to decrease simple carbohydrates, increase lean proteins and exercise to promote weight loss, improve glycemic control and prevent progression to Type 2 diabetes.   Hypothyroidism (Hashimoto's thyroiditis) Now seeing Dr. Sam.   Hashimoto's thyroiditis managed with levothyroxine . Consideration of absorption issues with levothyroxine , pursuing prior authorization for an alternative medication. Advised to avoid gluten and carbs to prevent flare-ups. - Continue levothyroxine  300 mcg daily as directed until prior authorization for alternative medication is obtained. - Avoid gluten and carbohydrates to prevent Hashimoto's flare-ups.  Leukopenia Leukopenia with a white blood cell count of 2.5 with previously level 4.0 earlier this year, on the lower end of normal. She has periodically had low WBC, but 2.5 is the lowest in recent years.  Recent recheck of white blood cell count performed earlier today and results are pending. Potential causes include medication effects, but also can be due to infections, thyroid  disease or other autoimmune diseases .  May need further evaluation by hematology.  - Monitor white blood cell count results from recent recheck. - Evaluate potential medication effects on white blood cell count.  Migraine Migraine management with topiramate , which also aids in sleep maintenance and may help with cravings. Monitoring for potential side effects, including impact on white blood cell count. - Continue topiramate  50 mg daily  for migraine management and sleep maintenance. - Monitor for potential side effects, including impact on white blood cell count.  Vitamin D  deficiency Vitamin D  deficiency with a previous level of 31.8 in July. She has been taking vitamin D - Ergocalciferol  50,000 units once a week. Consideration to increase dosage during winter months to achieve a goal level of 50-70. Last vitamin D  Lab Results  Component Value Date   VD25OH 31.8 02/24/2024    - Refill vitamin D  prescription- Ergocalciferol  50,000 units once weekly Low vitamin D  levels can be associated with adiposity and may result in leptin resistance and weight gain. Also associated with fatigue.  Currently on vitamin D   supplementation without any adverse effects such as nausea, vomiting or muscle weakness.  - Consider increasing vitamin D  dosage to twice a week during winter months, pending lab results.  Vitals Temp: 98 F (36.7 C) BP: 98/64 Pulse Rate: 81 SpO2: 100 %   Anthropometric Measurements Height: 5' 5 (1.651 m) Weight: 191 lb (86.6 kg) BMI (Calculated): 31.78 Weight at Last Visit: 198 lb Weight Lost Since Last Visit: 7 lb Weight Gained Since Last Visit: 0 Starting Weight: 191 lb Total Weight Loss (lbs): 0 lb (0 kg)   Body Composition  Body Fat %: 41.8 % Fat Mass (lbs): 80 lbs Muscle Mass (lbs): 106 lbs Total Body Water  (lbs): 79.2 lbs Visceral Fat Rating : 9   Other Clinical Data Today's Visit #: 10 Starting Date: 10/01/23     ASSESSMENT AND PLAN:  Diet: Gray is currently in the action stage of change. As such, her goal is to continue with weight loss efforts. She has agreed to [employer].Gluten free plan  Exercise: Benedicta has been instructed to work up to a goal of 150 minutes of combined cardio and strengthening exercise per week for weight loss and overall health benefits.   Behavior Modification:  We discussed the following Behavioral Modification Strategies  today: increasing lean protein intake, decreasing simple carbohydrates, increasing vegetables, increase H2O intake, increase high fiber foods, no skipping meals, meal planning and cooking strategies, better snacking choices, emotional eating strategies , avoiding temptations, planning for success, and avoid gluten. We discussed various medication options to help Kenyonna with her weight loss efforts and we both agreed to continue current treatment plan.  Return in about 4 weeks (around 07/11/2024).SABRA She was informed of the importance of frequent follow up visits to maximize her success with intensive lifestyle modifications for her multiple health conditions.  Attestation Statements:   Reviewed by  clinician on day of visit: allergies, medications, problem list, medical history, surgical history, family history, social history, and previous encounter notes.   Time spent on visit including pre-visit chart review and post-visit care and charting was 42 minutes.    Jamion Carter, PA-C

## 2024-06-14 ENCOUNTER — Ambulatory Visit: Payer: Self-pay | Admitting: Family Medicine

## 2024-06-14 NOTE — Telephone Encounter (Signed)
 Please refer to pharmacy telephone encounter from 06/02/2024  PA was CANCELLED due to This drug/product is not covered under the pharmacy benefit. Prior Authorization is not available

## 2024-06-14 NOTE — Telephone Encounter (Signed)
 Any update on this message please?

## 2024-06-16 ENCOUNTER — Other Ambulatory Visit: Payer: Self-pay

## 2024-06-20 ENCOUNTER — Encounter: Payer: Self-pay | Admitting: Radiology

## 2024-06-21 ENCOUNTER — Encounter: Payer: Self-pay | Admitting: Internal Medicine

## 2024-06-21 NOTE — Telephone Encounter (Signed)
 Attempted to contact patient assistance but had to leave a message for them to call back.

## 2024-06-22 ENCOUNTER — Other Ambulatory Visit: Payer: Self-pay | Admitting: Medical Genetics

## 2024-06-22 ENCOUNTER — Other Ambulatory Visit: Payer: Self-pay

## 2024-06-22 DIAGNOSIS — Z006 Encounter for examination for normal comparison and control in clinical research program: Secondary | ICD-10-CM

## 2024-06-22 MED ORDER — LEVOTHYROXINE SODIUM 300 MCG PO TABS
300.0000 ug | ORAL_TABLET | ORAL | 1 refills | Status: DC
  Filled 2024-06-22 – 2024-06-24 (×3): qty 72, 84d supply, fill #0

## 2024-06-22 NOTE — Addendum Note (Signed)
 Addended by: CLEOTILDE ROLIN RAMAN on: 06/22/2024 11:23 AM   Modules accepted: Orders

## 2024-06-23 ENCOUNTER — Other Ambulatory Visit: Payer: Self-pay

## 2024-06-24 ENCOUNTER — Other Ambulatory Visit: Payer: Self-pay

## 2024-06-24 ENCOUNTER — Encounter: Payer: Self-pay | Admitting: Internal Medicine

## 2024-06-24 ENCOUNTER — Telehealth: Payer: Self-pay

## 2024-06-24 NOTE — Telephone Encounter (Signed)
 Pharmacy called requesting permission to change manufacturers for patient meds.

## 2024-06-27 ENCOUNTER — Other Ambulatory Visit: Payer: Self-pay

## 2024-06-28 ENCOUNTER — Other Ambulatory Visit: Payer: Self-pay

## 2024-06-29 ENCOUNTER — Telehealth: Payer: Self-pay | Admitting: *Deleted

## 2024-06-29 NOTE — Telephone Encounter (Signed)
Patient given info.

## 2024-06-29 NOTE — Telephone Encounter (Signed)
 Patient wants to know if rosuvastatin can cause acid reflux. Since starting she has had a lot.

## 2024-06-29 NOTE — Telephone Encounter (Signed)
 It is a possible side effect. Often changing the timing of taking it can help (ie taking it at bedtime, or if that's when she is taking it, switch to daytime).

## 2024-07-11 NOTE — Progress Notes (Unsigned)
 SUBJECTIVE: Discussed the use of AI scribe software for clinical note transcription with the patient, who gave verbal consent to proceed.  Chief Complaint: Obesity  Interim History: She has gained 1 lb since her last visit.   Crucita is here to discuss her progress with her obesity treatment plan. She is on the Vegetarian Plan and Gluten free and states she is following her eating plan approximately 75-80 % of the time. She states she is exercising gym cardio and strength training 45-60 minutes 3-4 times per week.  Donna Mccarthy is a 48 year old female who presents for follow-up of her obesity treatment plan.  She has a history of insulin  resistance, hypothyroidism, migraine headaches, hyperlipidemia, and vitamin D  deficiency. She has  gained one pound since her last visit.  She follows a vegetarian gluten-free diet 75-80% of the time, tracks her calories, consumes more whole foods, meets the recommended protein intake, and drinks adequate water . She does not skip meals. She is incorporating some meats into her diet two to three times a week and is mindful of her protein intake. She has been trying to wean off coffee and has switched to chai tea without sugar.  She exercises at the gym for 45-60 minutes, three to four times per week. She experienced a setback last week due to a bout of food poisoning, which affected her gym attendance.  She is on levothyroxine  300 mcg daily for hypothyroidism, which she takes six days a week. She is also on ergocalciferol  50,000 units once weekly for vitamin D  deficiency. She recently started on topiramate  to assist with medical weight loss. She takes bupropion  at night but reports sleep disturbances and switched the timing of her medication due to issues with her thyroid  medication. We discussed switching bupropion  to earlier during the daytime/preferably before noon daily,   to improve her sleep.   She inquired about the possibility of using medications  like Wegovy  or Saxenda for weight management, but her insurance does not cover these options We discussed other options for medical weight loss .  We discussed use of phentermine  today.  Medication safety: Reviewed common side effects of phentermine  Reviewed for contraindications, none present Reviewed controlled substance agreement and signed today Reviewed state registry for controlled substances no other controlled substances found Medication will be discontinued if less than 5% weight loss in 6 months Discussed safety data and off label use for long-term treatment of obesity.  She has been in contact with a GI office in [place] for an appointment, but there is a long waitlist.  OBJECTIVE: Visit Diagnoses: Problem List Items Addressed This Visit     Hypothyroidism   Migraine   Relevant Medications   topiramate  (TOPAMAX ) 25 MG tablet   Vitamin D  deficiency   Other Visit Diagnoses       Insulin  resistance    -  Primary   Relevant Medications   topiramate  (TOPAMAX ) 25 MG tablet     Hyperlipidemia, unspecified hyperlipidemia type         Obesity starting BMI 31.78 date/10/01/23       Relevant Medications   Phentermine  HCl (LOMAIRA ) 8 MG TABS   topiramate  (TOPAMAX ) 25 MG tablet     Stress       Relevant Medications   topiramate  (TOPAMAX ) 25 MG tablet     Class 1 obesity starting BMI 31.78 date/10/01/23       Relevant Medications   Phentermine  HCl (LOMAIRA ) 8 MG TABS   topiramate  (TOPAMAX ) 25  MG tablet     BMI 32.0-32.9,adult Current BMI 32.0         Obesity, class 1 with insulin  resistance Obesity class 1 with insulin  resistance. Weight loss of one pound since last visit. Following a vegetarian gluten-free diet 75-80% of the time, tracking calories, and engaging in regular exercise. Considering pharmacological options for weight management. Discussed phentermine  as a cost-effective option compared to Wegovy , which is available at a discounted rate through March 2026.  Phentermine  may provide an energy boost and aid in weight loss. Discussed potential side effects, including interference with sleep if taken too late. Emphasized the importance of taking phentermine  in the morning and ensuring it is taken after Synthroid .  - Prescribed phentermine  8 mg, instructed to start with half a tablet and increase to a full tablet if tolerated after 1-2 weeks. I have consulted the [place] Controlled Substances Registry for this patient, and feel the risk/benefit ratio today is favorable for proceeding with this prescription for a controlled substance. No aberrancies noted. Patient signed Phentermine  medication contract at today's visit.  - Continue/refill  topiramate  50 mg daily at bedtime if no side effects are present. - Scheduled follow-up appointment on December 30th at 4:00 PM.  Meds ordered this encounter  Medications   Phentermine  HCl (LOMAIRA ) 8 MG TABS    Sig: Take 1 tablet (8 mg total) by mouth daily.    Dispense:  28 tablet    Refill:  0   topiramate  (TOPAMAX ) 25 MG tablet    Sig: Take 2 tablets (50 mg total) by mouth daily.    Dispense:  60 tablet    Refill:  1    Insulin  Resistance Last fasting insulin  was 5.8- near goal. A1c was 5.3- at goal. Polyphagia:Yes Medication(s): Topiramate  50 mg nightly   No SE with topiramate - no word finding problems or fatigue with medication Lab Results  Component Value Date   HGBA1C 5.3 02/24/2024   HGBA1C 5.3 10/01/2023   HGBA1C 5.0 10/04/2022   HGBA1C 5.4 04/20/2019   Lab Results  Component Value Date   INSULIN  5.8 02/24/2024   INSULIN  6.3 10/01/2023    Plan: Continue and refill and Start Topiramate  50 mg nightly and Lomaira  8 mg by mouth  start with 1/2 tab in morning and increase to whole tablet in 1-2 weeks if tolerating well Continue working on nutrition plan to decrease simple carbohydrates, increase lean proteins and exercise to promote weight loss, improve glycemic control and prevent progression to Type  2 diabetes.   Hypothyroidism Managed with levothyroxine  300 mcg daily. No recent changes in dosing. Thyroid  levels to be rechecked on December 4th. Discussed the importance of taking Synthroid  first thing in the morning and waiting at least an hour before taking phentermine . - Continue levothyroxine  300 mcg daily. - Will recheck thyroid  levels on December 4th with Dr. Sam.  Vitamin D  deficiency Managed with ergocalciferol  50,000 units weekly. No N/V or muscle weakness with Ergocalciferol  Last vitamin D  Lab Results  Component Value Date   VD25OH 31.8 02/24/2024   Low vitamin D  levels can be associated with adiposity and may result in leptin resistance and weight gain. Also associated with fatigue.  Currently on vitamin D  supplementation without any adverse effects such as nausea, vomiting or muscle weakness.   - Continue ergocalciferol  50,000 units weekly. No refill needed this visit.   Vitals Temp: 98.2 F (36.8 C) BP: 102/64 Pulse Rate: 75 SpO2: 100 %   Anthropometric Measurements Height: 5' 5 (1.651 m) Weight:  192 lb (87.1 kg) BMI (Calculated): 31.95 Weight at Last Visit: 191 lb Weight Lost Since Last Visit: 0 Weight Gained Since Last Visit: 1 lb Starting Weight: 191 lb Total Weight Loss (lbs): 0 lb (0 kg)   Body Composition  Body Fat %: 42.3 % Fat Mass (lbs): 81.4 lbs Muscle Mass (lbs): 105.4 lbs Total Body Water  (lbs): 79.8 lbs Visceral Fat Rating : 10   Other Clinical Data Fasting: No Labs: No Today's Visit #: 11 Starting Date: 10/01/23     ASSESSMENT AND PLAN:  Diet: [place] is currently in the action stage of change. As such, her goal is to continue with weight loss efforts. She has agreed to Vegetarian Plan and Gluten free.  Exercise: Greg has been instructed to continue exercising as is for weight loss and overall health benefits.   Behavior Modification:  We discussed the following Behavioral Modification Strategies today: increasing  lean protein intake, decreasing simple carbohydrates, increasing vegetables, increase H2O intake, increase high fiber foods, no skipping meals, meal planning and cooking strategies, holiday eating strategies, avoiding temptations, and planning for success. We discussed various medication options to help Dayane with her weight loss efforts and we both agreed to start Lomaira  4 mg daily in morning x 1-2 weeks and increase to 8 mg daily if tolerating well, continue topiramate  50 mg nightly for medical weight loss and insulin  resistance.  Return in about 5 weeks (around 08/16/2024).SABRA She was informed of the importance of frequent follow up visits to maximize her success with intensive lifestyle modifications for her multiple health conditions.  Attestation Statements:   Reviewed by clinician on day of visit: allergies, medications, problem list, medical history, surgical history, family history, social history, and previous encounter notes.   Time spent on visit including pre-visit chart review and post-visit care and charting was 35 minutes.    Eilene Voigt, PA-C

## 2024-07-12 ENCOUNTER — Encounter (INDEPENDENT_AMBULATORY_CARE_PROVIDER_SITE_OTHER): Payer: Self-pay | Admitting: Physician Assistant

## 2024-07-12 ENCOUNTER — Other Ambulatory Visit: Payer: Self-pay

## 2024-07-12 ENCOUNTER — Ambulatory Visit (INDEPENDENT_AMBULATORY_CARE_PROVIDER_SITE_OTHER): Payer: Self-pay | Admitting: Physician Assistant

## 2024-07-12 VITALS — BP 102/64 | HR 75 | Temp 98.2°F | Ht 65.0 in | Wt 192.0 lb

## 2024-07-12 DIAGNOSIS — Z6832 Body mass index (BMI) 32.0-32.9, adult: Secondary | ICD-10-CM | POA: Diagnosis not present

## 2024-07-12 DIAGNOSIS — E559 Vitamin D deficiency, unspecified: Secondary | ICD-10-CM

## 2024-07-12 DIAGNOSIS — E039 Hypothyroidism, unspecified: Secondary | ICD-10-CM | POA: Diagnosis not present

## 2024-07-12 DIAGNOSIS — E785 Hyperlipidemia, unspecified: Secondary | ICD-10-CM

## 2024-07-12 DIAGNOSIS — E66811 Obesity, class 1: Secondary | ICD-10-CM | POA: Diagnosis not present

## 2024-07-12 DIAGNOSIS — E88819 Insulin resistance, unspecified: Secondary | ICD-10-CM | POA: Diagnosis not present

## 2024-07-12 DIAGNOSIS — G43909 Migraine, unspecified, not intractable, without status migrainosus: Secondary | ICD-10-CM

## 2024-07-12 MED ORDER — TOPIRAMATE 25 MG PO TABS
50.0000 mg | ORAL_TABLET | Freq: Every day | ORAL | 1 refills | Status: DC
  Filled 2024-07-12: qty 60, 30d supply, fill #0

## 2024-07-12 MED ORDER — PHENTERMINE HCL 8 MG PO TABS
8.0000 mg | ORAL_TABLET | Freq: Every day | ORAL | 0 refills | Status: DC
  Filled 2024-07-12: qty 28, 28d supply, fill #0

## 2024-07-13 ENCOUNTER — Other Ambulatory Visit: Payer: Self-pay

## 2024-07-20 ENCOUNTER — Encounter (INDEPENDENT_AMBULATORY_CARE_PROVIDER_SITE_OTHER): Payer: Self-pay | Admitting: Physician Assistant

## 2024-07-21 ENCOUNTER — Other Ambulatory Visit

## 2024-07-21 ENCOUNTER — Other Ambulatory Visit: Payer: Self-pay | Admitting: Internal Medicine

## 2024-07-21 DIAGNOSIS — D649 Anemia, unspecified: Secondary | ICD-10-CM | POA: Diagnosis not present

## 2024-07-21 DIAGNOSIS — E039 Hypothyroidism, unspecified: Secondary | ICD-10-CM | POA: Diagnosis not present

## 2024-07-21 DIAGNOSIS — N921 Excessive and frequent menstruation with irregular cycle: Secondary | ICD-10-CM | POA: Diagnosis not present

## 2024-07-22 ENCOUNTER — Encounter: Payer: Self-pay | Admitting: Family Medicine

## 2024-07-22 ENCOUNTER — Other Ambulatory Visit: Payer: Self-pay

## 2024-07-22 ENCOUNTER — Ambulatory Visit: Payer: Self-pay | Admitting: Internal Medicine

## 2024-07-22 ENCOUNTER — Other Ambulatory Visit (HOSPITAL_COMMUNITY): Payer: Self-pay

## 2024-07-22 DIAGNOSIS — Z1211 Encounter for screening for malignant neoplasm of colon: Secondary | ICD-10-CM

## 2024-07-22 DIAGNOSIS — K30 Functional dyspepsia: Secondary | ICD-10-CM

## 2024-07-22 LAB — T4, FREE: Free T4: 2 ng/dL — ABNORMAL HIGH (ref 0.8–1.8)

## 2024-07-22 LAB — TSH: TSH: 0.01 m[IU]/L — ABNORMAL LOW

## 2024-07-22 MED ORDER — LEVOTHYROXINE SODIUM 200 MCG PO TABS
200.0000 ug | ORAL_TABLET | Freq: Every day | ORAL | 6 refills | Status: DC
  Filled 2024-07-22 (×2): qty 30, 30d supply, fill #0

## 2024-07-22 MED ORDER — LEVOTHYROXINE SODIUM 200 MCG PO TABS
200.0000 ug | ORAL_TABLET | Freq: Every day | ORAL | 6 refills | Status: DC
  Filled 2024-07-22: qty 30, 30d supply, fill #0
  Filled 2024-08-26: qty 30, 30d supply, fill #1

## 2024-07-22 NOTE — Telephone Encounter (Signed)
 Script updated

## 2024-07-25 ENCOUNTER — Other Ambulatory Visit: Payer: Self-pay

## 2024-07-25 MED ORDER — ACCRUFER 30 MG PO CAPS
1.0000 | ORAL_CAPSULE | Freq: Every day | ORAL | 0 refills | Status: AC
  Filled 2024-07-25: qty 30, 30d supply, fill #0

## 2024-08-15 NOTE — Progress Notes (Deleted)
" ° °  SUBJECTIVE: Discussed the use of AI scribe software for clinical note transcription with the patient, who gave verbal consent to proceed.  Chief Complaint: Obesity  Interim History: ***  Annmargaret is here to discuss her progress with her obesity treatment plan. She is on the {HWW Weight Loss Plan:210964005} and states she {CHL AMB IS/IS NOT:210130109} following her eating plan approximately *** % of the time. She states she {CHL AMB IS/IS NOT:210130109} exercising *** minutes *** times per week.   OBJECTIVE: Visit Diagnoses: Problem List Items Addressed This Visit     Hypothyroidism   Vitamin D  deficiency   Other Visit Diagnoses       Insulin  resistance    -  Primary     Obesity starting BMI 31.78 date/10/01/23           No data recorded No data recorded No data recorded No data recorded   ASSESSMENT AND PLAN:  Diet: Raylyn {CHL AMB IS/IS NOT:210130109} currently in the action stage of change. As such, her goal is to {HWW Weight Loss Efforts:210964006}. She {HAS HAS WNU:81165} agreed to {HWW Weight Loss Plan:210964005}.  Exercise: Jomaira has been instructed {HWW Exercise:210964007} for weight loss and overall health benefits.   Behavior Modification:  We discussed the following Behavioral Modification Strategies today: {HWW Behavior Modification:210964008}. We discussed various medication options to help Darlynn with her weight loss efforts and we both agreed to ***.  No follow-ups on file.SABRA She was informed of the importance of frequent follow up visits to maximize her success with intensive lifestyle modifications for her multiple health conditions.  Attestation Statements:   Reviewed by clinician on day of visit: allergies, medications, problem list, medical history, surgical history, family history, social history, and previous encounter notes.   Time spent on visit including pre-visit chart review and post-visit care and charting was *** minutes.    Latia Mataya, PA-C  "

## 2024-08-16 ENCOUNTER — Ambulatory Visit (INDEPENDENT_AMBULATORY_CARE_PROVIDER_SITE_OTHER): Admitting: Physician Assistant

## 2024-08-16 ENCOUNTER — Other Ambulatory Visit (INDEPENDENT_AMBULATORY_CARE_PROVIDER_SITE_OTHER): Payer: Self-pay | Admitting: Physician Assistant

## 2024-08-16 ENCOUNTER — Encounter (INDEPENDENT_AMBULATORY_CARE_PROVIDER_SITE_OTHER): Payer: Self-pay | Admitting: Physician Assistant

## 2024-08-16 ENCOUNTER — Other Ambulatory Visit: Payer: Self-pay

## 2024-08-16 DIAGNOSIS — E6609 Other obesity due to excess calories: Secondary | ICD-10-CM

## 2024-08-16 DIAGNOSIS — E88819 Insulin resistance, unspecified: Secondary | ICD-10-CM

## 2024-08-16 MED ORDER — TOPIRAMATE 25 MG PO TABS
50.0000 mg | ORAL_TABLET | Freq: Every day | ORAL | 1 refills | Status: DC
  Filled 2024-08-16: qty 60, 30d supply, fill #0

## 2024-08-16 MED ORDER — PHENTERMINE HCL 8 MG PO TABS
8.0000 mg | ORAL_TABLET | Freq: Two times a day (BID) | ORAL | 0 refills | Status: DC
  Filled 2024-08-16: qty 60, 30d supply, fill #0

## 2024-08-22 ENCOUNTER — Encounter: Payer: Self-pay | Admitting: Internal Medicine

## 2024-08-23 ENCOUNTER — Other Ambulatory Visit: Payer: Self-pay

## 2024-08-23 ENCOUNTER — Ambulatory Visit (INDEPENDENT_AMBULATORY_CARE_PROVIDER_SITE_OTHER): Admitting: Family Medicine

## 2024-08-23 ENCOUNTER — Encounter (INDEPENDENT_AMBULATORY_CARE_PROVIDER_SITE_OTHER): Payer: Self-pay | Admitting: Family Medicine

## 2024-08-23 VITALS — BP 101/69 | HR 63 | Temp 97.5°F | Ht 65.0 in | Wt 186.0 lb

## 2024-08-23 DIAGNOSIS — E88819 Insulin resistance, unspecified: Secondary | ICD-10-CM | POA: Diagnosis not present

## 2024-08-23 DIAGNOSIS — Z6830 Body mass index (BMI) 30.0-30.9, adult: Secondary | ICD-10-CM | POA: Diagnosis not present

## 2024-08-23 DIAGNOSIS — E669 Obesity, unspecified: Secondary | ICD-10-CM

## 2024-08-23 DIAGNOSIS — E559 Vitamin D deficiency, unspecified: Secondary | ICD-10-CM

## 2024-08-23 DIAGNOSIS — E66811 Obesity, class 1: Secondary | ICD-10-CM | POA: Diagnosis not present

## 2024-08-23 MED ORDER — VITAMIN D (ERGOCALCIFEROL) 1.25 MG (50000 UNIT) PO CAPS
50000.0000 [IU] | ORAL_CAPSULE | ORAL | 1 refills | Status: DC
  Filled 2024-08-23: qty 4, 28d supply, fill #0

## 2024-08-23 NOTE — Progress Notes (Signed)
 "  Office: [phone]  /  Fax: [phone]  WEIGHT SUMMARY AND BIOMETRICS  Anthropometric Measurements Height: 5' 5 (1.651 m) Weight: 186 lb (84.4 kg) BMI (Calculated): 30.95 Weight at Last Visit: 192 lb Weight Lost Since Last Visit: 6 lb Weight Gained Since Last Visit: 0 Starting Weight: 191 lb Total Weight Loss (lbs): 5 lb (2.268 kg) Peak Weight: 308 lb (per pt today)   Body Composition  Body Fat %: 41.4 % Fat Mass (lbs): 77 lbs Muscle Mass (lbs): 103.4 lbs Total Body Water  (lbs): 76.8 lbs Visceral Fat Rating : 9   Other Clinical Data Fasting: no Labs: no Today's Visit #: 12 Starting Date: 10/01/23    Chief Complaint: OBESITY   History of Present Illness Donna Mccarthy is a 49 year old female with obesity and Hashimoto's thyroiditis who presents for obesity treatment and progress assessment.  She has been following a pescatarian diet and is working towards a gluten-free regimen, achieving this about 80% of the time. Her diet includes whole fruits, vegetables, and adequate protein intake, along with proper hydration.  She exercises two to three times a week for about 45 minutes at the gym and has lost six pounds over the last six weeks, including during the holiday season.  She was diagnosed with Hashimoto's thyroiditis after changing endocrinologists, resulting in a medication adjustment. She is currently taking levothyroxine  200 micrograms, reduced from 300 micrograms.  She is being treated for vitamin D  deficiency with hydroxocobalamin 50,000 international units weekly.  She is experiencing perimenopausal symptoms, including irregular bleeding.      PHYSICAL EXAM:  Blood pressure 101/69, pulse 63, temperature (!) 97.5 F (36.4 C), height 5' 5 (1.651 m), weight 186 lb (84.4 kg), SpO2 100%. Body mass index is 30.95 kg/m.  DIAGNOSTIC DATA REVIEWED:  BMET    Component Value Date/Time   NA 141 02/24/2024 0943   K 4.6 02/24/2024 0943   CL 104  02/24/2024 0943   CO2 18 (L) 02/24/2024 0943   GLUCOSE 82 02/24/2024 0943   GLUCOSE 88 01/14/2023 1119   BUN 8 02/24/2024 0943   CREATININE 0.76 02/24/2024 0943   CALCIUM  9.2 02/24/2024 0943   GFRNONAA >60 01/14/2023 1110   GFRAA 129 04/27/2020 1503   Lab Results  Component Value Date   HGBA1C 5.3 02/24/2024   HGBA1C 5.4 04/20/2019   Lab Results  Component Value Date   INSULIN  5.8 02/24/2024   INSULIN  6.3 10/01/2023   Lab Results  Component Value Date   TSH 0.01 (L) 07/21/2024   CBC    Component Value Date/Time   WBC 3.8 06/13/2024 1102   WBC 3.5 (L) 04/21/2023 1543   RBC 4.33 06/13/2024 1102   RBC 3.90 04/21/2023 1543   HGB 11.7 06/13/2024 1102   HCT 37.9 06/13/2024 1102   PLT 211 06/13/2024 1102   MCV 88 06/13/2024 1102   MCH 27.0 06/13/2024 1102   MCH 26.7 04/21/2023 1543   MCHC 30.9 (L) 06/13/2024 1102   MCHC 31.3 04/21/2023 1543   RDW 13.1 06/13/2024 1102   Iron Studies    Component Value Date/Time   IRON 91 06/08/2024 1039   TIBC 371 06/08/2024 1039   FERRITIN 15 06/08/2024 1039   IRONPCTSAT 25 06/08/2024 1039   Lipid Panel     Component Value Date/Time   CHOL 176 02/24/2024 0943   TRIG 39 02/24/2024 0943   HDL 68 02/24/2024 0943   CHOLHDL 2.3 10/01/2023 0916   CHOLHDL 3.2 10/04/2022 0244   VLDL  6 10/04/2022 0244   LDLCALC 100 (H) 02/24/2024 0943   Hepatic Function Panel     Component Value Date/Time   PROT 6.9 02/24/2024 0943   ALBUMIN 4.3 02/24/2024 0943   AST 22 02/24/2024 0943   ALT 17 02/24/2024 0943   ALKPHOS 64 02/24/2024 0943   BILITOT 0.8 02/24/2024 0943   BILIDIR 0.13 01/26/2023 0836   IBILI NOT CALCULATED 10/24/2019 1604      Component Value Date/Time   TSH 0.01 (L) 07/21/2024 0838   Nutritional Lab Results  Component Value Date   VD25OH 31.8 02/24/2024   VD25OH 28.8 (L) 10/01/2023   VD25OH 32.6 05/20/2023     Assessment and Plan Assessment & Plan Class 1 obesity and insulin  resistance She has lost six pounds in  the last six weeks, including over Thanksgiving, Christmas, and New Year. She follows a pescatarian diet, is gluten-free 80% of the time, and exercises 2-3 times a week for 45 minutes. Her fat percentage is 41%, and her visceral fat rating is 9, close to the target of 8 for her menopausal status. Muscle mass is aligned with fat percentage, indicating a healthy metabolism. She aims to reach a weight of 175 pounds. - Continue pescatarian diet and gluten-free regimen. - Continue exercise regimen of 2-3 times a week for 45 minutes. - Aim for a weight of 175 pounds. - Monitor fat percentage and visceral fat rating. - Ensure muscle mass is maintained or increased.  Vitamin D  deficiency She is on prescription hydroxocobalamin 50,000 IU weekly and requests a refill. - Refilled hydroxocobalamin 50,000 IU weekly.      Patients who are on anti-obesity medications are counseled on the importance of maintaining healthy lifestyle habits, including balanced nutrition, regular physical activity, and behavioral modifications,  Medication is an adjunct to, not a replacement for, lifestyle changes and that the long-term success and weight maintenance depend on continued adherence to these strategies.   Donna Mccarthy was informed of the importance of frequent follow up visits to maximize her success with intensive lifestyle modifications for her obesity and obesity related health conditions as recommended by USPSTF and CMS guidelines   [name], MD   "

## 2024-09-06 ENCOUNTER — Other Ambulatory Visit: Payer: Self-pay

## 2024-09-07 ENCOUNTER — Other Ambulatory Visit

## 2024-09-07 ENCOUNTER — Ambulatory Visit: Admitting: Internal Medicine

## 2024-09-07 ENCOUNTER — Encounter: Payer: Self-pay | Admitting: Internal Medicine

## 2024-09-07 VITALS — BP 106/78 | HR 85 | Ht 65.0 in | Wt 191.0 lb

## 2024-09-07 DIAGNOSIS — E063 Autoimmune thyroiditis: Secondary | ICD-10-CM | POA: Diagnosis not present

## 2024-09-07 NOTE — Progress Notes (Unsigned)
 "   Name: Donna Mccarthy  MRN/ Mccarthy: [id number], 1976/04/25    Age/ Sex: 49 y.o., female    PCP: Randol Dawes, MD   Reason for Endocrinology Evaluation: Mccarthy     Date of Initial Endocrinology Evaluation: 09/07/2024     HPI: Donna Mccarthy is a 49 y.o. female with a past medical history of Mccarthy, Donna Mccarthy, Donna Mccarthy. The patient presented for initial endocrinology clinic visit on 09/07/2024 for consultative assistance with her Mccarthy.   The patient has been diagnosed with Mccarthy in 2001 No prior XRT or sx of the neck  Patient on Mirena    Thyroid  ultrasound in 2020 and 2024 did not reveal any thyroid  nodules.  Synthroid  is cost prohibitive    Per patient celiac disease screening was negative through her previous endocrinologist office Transferred care from Dr. Tommas  Mother with Crohn's disease Paternal grandmother with thyroid  disease     SUBJECTIVE:    Today (09/07/24):  Donna Mccarthy  is here for a follow up on Hashimoto's thyroiditis.   She continues to  follow up  with [facility] She has lost weight despite the holidays.  She follows a gluten-free diet 80% of the time, she follows a pescatarian diet Patient follows with neurology for migraine headaches  No local neck swelling  Has noted acid reflux and minimal dysphagia  Continues with occasional palpitation  Has noted cold intolerances of the feet  Continues with constipation  Has noted improvement in hair growth especially around the temples Continues with fatigue    Saw Gyn for irregular menstruations. Scheduled for ablation in 09/2024 Wakes up at 3 am    Levothyroxine  200 mcg daily   HISTORY:  Past Medical History:  Past Medical History:  Diagnosis Date   Allergy    Anemia    Anxiety    Arthritis    arthritis knees   Blood transfusion without reported diagnosis    Donna Mccarthy (gastroesophageal reflux disease)    Hashimoto's  disease 2025   Mccarthy    Neuromuscular disorder (HCC)    Obesity (BMI 30-39.9) 01/05/2019   stroke 10/03/22   Vitamin D  deficiency    Past Surgical History:  Past Surgical History:  Procedure Laterality Date   BIOPSY  04/03/2020   Procedure: BIOPSY;  Surgeon: Eartha Angelia Sieving, MD;  Location: [facility];  Service: Gastroenterology;;  antral, body, distal esophagus, mid esophagus   BRACHIOPLASTY Bilateral 07/02/2020   Procedure: BILATERAL BRACHIOPLASTY WITH LIPOSUCTION;  Surgeon: Elisabeth Craig RAMAN, MD;  Location: [facility];  Service: Plastics;  Laterality: Bilateral;   COSMETIC SURGERY     excess skin removal   DILATION AND CURETTAGE OF UTERUS  2022   ESOPHAGOGASTRODUODENOSCOPY (EGD) WITH PROPOFOL  N/A 04/03/2020   Procedure: ESOPHAGOGASTRODUODENOSCOPY (EGD) WITH PROPOFOL ;  Surgeon: Eartha Angelia Sieving, MD;  Location: [facility];  Service: Gastroenterology;  Laterality: N/A;  11:15   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   LIPOSUCTION Bilateral 07/02/2020   Procedure: LIPOSUCTION;  Surgeon: Elisabeth Craig RAMAN, MD;  Location: [facility];  Service: Plastics;  Laterality: Bilateral;   PANNICULECTOMY N/A 08/04/2019   Procedure: PANNICULECTOMY;  Surgeon: Elisabeth Craig RAMAN, MD;  Location: [facility];  Service: Plastics;  Laterality: N/A;  2 hours only, please   TEE WITHOUT CARDIOVERSION N/A 10/07/2022   Procedure: TRANSESOPHAGEAL ECHOCARDIOGRAM (TEE);  Surgeon: Hobart, Heather  E, MD;  Location: [facility];  Service: Cardiovascular;  Laterality: N/A;  Social History:  reports that she has never smoked. She has never used smokeless tobacco. She reports that she does not drink alcohol and does not use drugs. Family History: family history includes Arthritis in her father; Breast cancer in her maternal aunt and maternal grandmother; Colon cancer in her maternal uncle; Colon cancer (age of onset: 63) in her maternal  uncle; Colon polyps in her mother; Congestive Heart Failure (age of onset: 54) in her father; Crohn's disease in her mother; Diabetes in her maternal grandmother; Healthy in her grandson and son; Heart attack in her paternal grandfather; Heart disease in her paternal grandfather; Heart failure in her maternal grandmother and paternal grandfather; Hyperlipidemia in her father; Hypertension in her brother, father, and mother; Stroke in her father; Thyroid  disease in her paternal grandmother; Vitamin D  deficiency in her paternal grandmother.   HOME MEDICATIONS: Allergies as of 09/07/2024       Reactions   Ketorolac Tromethamine Other (See Comments)   felt like whole body was on fire   Penicillins Other (See Comments)   Mother said she had a rash   Celebrex  [celecoxib ] Itching   itching   Compazine  [prochlorperazine ] Other (See Comments)   Out of body experience   Egg Protein-containing Drug Products Other (See Comments)        Medication List        Accurate as of September 07, 2024  7:40 AM. If you have any questions, ask your nurse or doctor.          ACCRUFeR  30 MG Caps Generic drug: Ferric Maltol  Take 1 capsule (30 mg total) by mouth daily.   buPROPion  300 MG 24 hr tablet Commonly known as: Wellbutrin  XL Take 1 tablet (300 mg total) by mouth daily.   butalbital -acetaminophen -caffeine  50-325-40 MG tablet Commonly known as: FIORICET  Take 1 tablet by mouth daily as needed for headache.   fluticasone  50 MCG/ACT nasal spray Commonly known as: FLONASE  Place 2 sprays into both nostrils as needed for allergies.   levothyroxine  200 MCG tablet Commonly known as: SYNTHROID  Take 1 tablet (200 mcg total) by mouth daily.   Lomaira  8 MG Tabs Generic drug: Phentermine  HCl Take 1 tablet (8 mg total) by mouth 2 (two) times daily.   loratadine 10 MG tablet Commonly known as: CLARITIN Take 10 mg by mouth daily as needed for allergies.   Magnesium 250 MG Tabs Take 250 mg by  mouth every other day.   Mirena  (52 MG) 20 MCG/DAY Iud Generic drug: levonorgestrel  Take by intrauterine route.   Nurtec 75 MG Tbdp Generic drug: Rimegepant Sulfate  Take 1 tablet (75 mg total) by mouth every other day.   pantoprazole  40 MG tablet Commonly known as: PROTONIX  Take 1 tablet (40 mg total) by mouth at bedtime.   rosuvastatin  5 MG tablet Commonly known as: Crestor  Take 1 tablet (5 mg total) by mouth daily.   topiramate  25 MG tablet Commonly known as: Topamax  Take 2 tablets (50 mg total) by mouth daily.   Vitamin D  (Ergocalciferol ) 1.25 MG (50000 UNIT) Caps capsule Commonly known as: DRISDOL  Take 1 capsule (50,000 Units total) by mouth every 7 (seven) days.          REVIEW OF SYSTEMS: A comprehensive ROS was conducted with the patient and is negative except as per HPI    OBJECTIVE:  VS: BP 106/78   Pulse 85   Ht 5' 5 (1.651 m)   Wt 191 lb (86.6 kg)   BMI 31.78 kg/m  Wt Readings from Last 3 Encounters:  09/07/24 191 lb (86.6 kg)  08/23/24 186 lb (84.4 kg)  07/12/24 192 lb (87.1 kg)     EXAM: General: Pt appears well and is in NAD  Neck: General: Supple without adenopathy. Thyroid :   No goiter or nodules appreciated  Lungs: Clear with good BS bilat   Heart: Auscultation: RRR.  Abdomen: Soft, nontender  Extremities:  BL LE: No pretibial edema   Mental Status: Judgment, insight: Intact Orientation: Oriented to time, place, and person Mood and affect: No depression, anxiety, or agitation     DATA REVIEWED:   Latest Reference Range & Units 09/07/24 08:15  TSH mIU/L 0.53  T4,Free(Direct) 0.8 - 1.8 ng/dL 1.3     ASSESSMENT/PLAN/RECOMMENDATIONS:   Hashimoto's Mccarthy:   - Patient continues with fatigue and cold intolerance - She has long history of fluctuating TSH - Brand levothyroxine  has been cost prohibitive and not covered by her insurance, insurance declined the prior authorization, patient assistance was denied as  well - Anti-TPO antibodies elevated - She has been taking phentermine  first followed by levothyroxine .  I did advise her to start taking levothyroxine  first waiting half an hour and then taking phentermine  -She takes PPI and iron at night - TFTs have finally normalized, no changes at this time    Medications : Continue levothyroxine  200 mcg , 1 tablet daily  Follow-up in 4 months  Labs in 2 months  Signed electronically by: Stefano Redgie Butts, MD  [facility]  [facility] [address] [place], [place] [address] Phone: [phone] FAX: [phone]   CC: Randol Dawes, MD [address] [place] [address] Phone: [phone] Fax: [phone]   Return to Endocrinology clinic as below: Future Appointments  Date Time Provider Department Center  09/20/2024  3:20 PM Verdon Louann BIRCH, MD [facility] None  10/19/2024  3:40 PM Verdon Louann BIRCH, MD [facility] None  01/12/2025  9:15 AM Gregg Lek, MD [facility] None         "

## 2024-09-07 NOTE — Patient Instructions (Signed)

## 2024-09-08 ENCOUNTER — Other Ambulatory Visit: Payer: Self-pay

## 2024-09-08 ENCOUNTER — Ambulatory Visit: Payer: Self-pay | Admitting: Internal Medicine

## 2024-09-08 LAB — TSH: TSH: 0.53 m[IU]/L

## 2024-09-08 LAB — T4, FREE: Free T4: 1.3 ng/dL (ref 0.8–1.8)

## 2024-09-08 MED ORDER — LEVOTHYROXINE SODIUM 200 MCG PO TABS
200.0000 ug | ORAL_TABLET | Freq: Every day | ORAL | 6 refills | Status: AC
  Filled 2024-09-08: qty 30, 30d supply, fill #0

## 2024-09-09 ENCOUNTER — Other Ambulatory Visit: Payer: Self-pay

## 2024-09-14 ENCOUNTER — Encounter: Payer: Self-pay | Admitting: Family Medicine

## 2024-09-20 ENCOUNTER — Other Ambulatory Visit (HOSPITAL_COMMUNITY): Payer: Self-pay

## 2024-09-20 ENCOUNTER — Other Ambulatory Visit: Payer: Self-pay

## 2024-09-20 ENCOUNTER — Ambulatory Visit (INDEPENDENT_AMBULATORY_CARE_PROVIDER_SITE_OTHER): Admitting: Family Medicine

## 2024-09-20 ENCOUNTER — Other Ambulatory Visit (INDEPENDENT_AMBULATORY_CARE_PROVIDER_SITE_OTHER): Payer: Self-pay | Admitting: Family Medicine

## 2024-09-20 ENCOUNTER — Encounter (INDEPENDENT_AMBULATORY_CARE_PROVIDER_SITE_OTHER): Payer: Self-pay | Admitting: Family Medicine

## 2024-09-20 VITALS — BP 130/77 | HR 64 | Temp 97.7°F | Ht 65.0 in | Wt 186.0 lb

## 2024-09-20 DIAGNOSIS — E669 Obesity, unspecified: Secondary | ICD-10-CM

## 2024-09-20 DIAGNOSIS — Z6830 Body mass index (BMI) 30.0-30.9, adult: Secondary | ICD-10-CM

## 2024-09-20 DIAGNOSIS — E559 Vitamin D deficiency, unspecified: Secondary | ICD-10-CM

## 2024-09-20 DIAGNOSIS — E88819 Insulin resistance, unspecified: Secondary | ICD-10-CM

## 2024-09-20 MED ORDER — VITAMIN D (ERGOCALCIFEROL) 1.25 MG (50000 UNIT) PO CAPS
50000.0000 [IU] | ORAL_CAPSULE | ORAL | 0 refills | Status: AC
  Filled 2024-09-20: qty 15, 105d supply, fill #0
  Filled 2024-09-20: qty 12, 84d supply, fill #0
  Filled 2024-09-20: qty 15, 105d supply, fill #0

## 2024-09-20 MED ORDER — VITAMIN D (ERGOCALCIFEROL) 1.25 MG (50000 UNIT) PO CAPS
50000.0000 [IU] | ORAL_CAPSULE | ORAL | 1 refills | Status: DC
  Filled 2024-09-20 (×2): qty 4, 28d supply, fill #0

## 2024-09-20 MED ORDER — PHENTERMINE HCL 8 MG PO TABS
8.0000 mg | ORAL_TABLET | Freq: Two times a day (BID) | ORAL | 0 refills | Status: AC
  Filled 2024-09-20: qty 60, 30d supply, fill #0

## 2024-09-20 MED ORDER — TOPIRAMATE 25 MG PO TABS
50.0000 mg | ORAL_TABLET | Freq: Every day | ORAL | 1 refills | Status: DC
  Filled 2024-09-20 (×2): qty 60, 30d supply, fill #0

## 2024-09-20 MED ORDER — TOPIRAMATE 25 MG PO TABS
50.0000 mg | ORAL_TABLET | Freq: Every day | ORAL | 0 refills | Status: AC
  Filled 2024-09-20 (×3): qty 180, 90d supply, fill #0

## 2024-09-21 ENCOUNTER — Encounter: Payer: Self-pay | Admitting: Family Medicine

## 2024-09-22 ENCOUNTER — Other Ambulatory Visit: Payer: Self-pay

## 2024-09-23 ENCOUNTER — Telehealth: Admitting: Student

## 2024-09-23 ENCOUNTER — Other Ambulatory Visit: Payer: Self-pay

## 2024-09-23 DIAGNOSIS — K130 Diseases of lips: Secondary | ICD-10-CM

## 2024-09-23 MED ORDER — CLOTRIMAZOLE 1 % EX OINT
1.0000 | TOPICAL_OINTMENT | Freq: Two times a day (BID) | CUTANEOUS | 0 refills | Status: AC
  Filled 2024-09-23: qty 56.7, 14d supply, fill #0

## 2024-09-23 NOTE — Progress Notes (Signed)
 " E-Visit for Mouth Ulcers  We are sorry that you are not feeling well.  Here is how we plan to help!  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     The following medications should decrease the discomfort and help with healing. Clotrimazole  1% ointment, small amount to affected area Twice daily for 1-3 weeks, until symptoms resolved. Avoid swallowing and wait 30 minutes after applying to eat/drink.   Angular cheilitis can be caused by excessive moisture and maceration from saliva, with secondary infection. This occurs at the corners of the mouth. While mostly harmless, this can be extremely uncomfortable and may make it difficult to eat, drink, and brush your teeth. this is not contagious and should not be confused with cold sores.  Cold sores appear on the lip or around the outside of the mouth and often begin with a tingling, burning or itching sensation.   While the exact causes are unknown, some common causes and factors that may aggravate mouth ulcers include: Genetics - Sometimes mouth ulcers run in families High alcohol intake Acidic foods such as citrus fruits like pineapple, grapefruit, orange fruits/juices, may aggravate mouth ulcers Other foods high in acidity or spice such as coffee, chocolate, chips, pretzels, eggs, nuts, cheese Quitting smoking Injury caused by biting the tongue or inside of the cheek Diet lacking in B-12, zinc, folic acid  or iron Female hormone shifts with menstruation Excessive fatigue, emotional stress or anxiety Prevention: Talk to your doctor if you are taking meds that are known to cause mouth ulcers such as:   Anti-inflammatory drugs (for example Ibuprofen , Naproxen sodium), pain killers, Beta blockers, Oral nicotine replacement drugs, Some street drugs (heroin).   Avoid allowing any tablets to dissolve in your mouth that are meant to swallowed whole Avoid foods/drinks that trigger or worsen symptoms Keep your  mouth clean with daily brushing and flossing  Home Care: The goal with treatment is to ease the pain where ulcers occur and help them heal as quickly as possible.  There is no medical treatment to prevent mouth ulcers from coming back or recurring.  Avoid spicy and acidic foods Eat soft foods and avoid rough, crunchy foods Avoid chewing gum Do not use toothpaste that contains sodium lauryl sulphite Use a straw to drink which helps avoid liquids toughing the ulcers near the front of your mouth Use a very soft toothbrush If you have dentures or dental hardware that you feel is not fitting well or contributing to his, please see your dentist. Use saltwater mouthwash which helps healing. Dissolve a  teaspoon of salt in a glass of warm water . Swish around your mouth and spit it out. This can be used as needed if it is soothing.   GET HELP RIGHT AWAY IF: Persistent ulcers require checking IN PERSON (face to face). Any mouth lesion lasting longer than a month should be seen by your DENTIST as soon as possible for evaluation for possible oral cancer. If you have a non-painful ulcer in 1 or more areas of your mouth Ulcers that are spreading, are very large or particularly painful Ulcers last longer than one week without improving on treatment If you develop a fever, swollen glands and begin to feel unwell Ulcers that developed after starting a new medication MAKE SURE YOU: Understand these instructions. Will watch your condition. Will get help right away if you are not doing well or get worse.  Thank you for choosing an e-visit.  Your e-visit answers were reviewed by a board certified advanced clinical practitioner to complete your personal care plan. Depending upon the condition, your plan could have included both over the counter or prescription medications.  Please review your pharmacy choice. Make sure the pharmacy is open so you can pick up prescription now. If there is a problem, you may  contact your provider through [employer] and have the prescription routed to another pharmacy.  Your safety is important to us . If you have drug allergies check your prescription carefully.   For the next 24 hours you can use MyChart to ask questions about today's visit, request a non-urgent call back, or ask for a work or school excuse. You will get an email in the next two days asking about your experience. I hope that your e-visit has been valuable and will speed your recovery.  I have spent 5 minutes in review of e-visit questionnaire, review and updating patient chart, medical decision making and response to patient.   Comer LULLA Rouleau, NP   "

## 2024-10-19 ENCOUNTER — Ambulatory Visit (INDEPENDENT_AMBULATORY_CARE_PROVIDER_SITE_OTHER): Admitting: Family Medicine

## 2024-11-14 ENCOUNTER — Ambulatory Visit (INDEPENDENT_AMBULATORY_CARE_PROVIDER_SITE_OTHER): Admitting: Family Medicine

## 2025-01-06 ENCOUNTER — Ambulatory Visit: Admitting: Internal Medicine

## 2025-01-12 ENCOUNTER — Ambulatory Visit: Admitting: Neurology
